# Patient Record
Sex: Male | Born: 1960 | Race: White | Hispanic: No | Marital: Married | Smoking: Current some day smoker
Health system: Southern US, Community
[De-identification: ages and names within clinical notes are randomized; demographics above are authoritative.]

## PROBLEM LIST (undated history)

## (undated) DIAGNOSIS — Z9889 Other specified postprocedural states: Secondary | ICD-10-CM

## (undated) DIAGNOSIS — M1990 Unspecified osteoarthritis, unspecified site: Secondary | ICD-10-CM

## (undated) DIAGNOSIS — C4491 Basal cell carcinoma of skin, unspecified: Secondary | ICD-10-CM

## (undated) DIAGNOSIS — I10 Essential (primary) hypertension: Secondary | ICD-10-CM

## (undated) DIAGNOSIS — I219 Acute myocardial infarction, unspecified: Secondary | ICD-10-CM

## (undated) DIAGNOSIS — T8859XA Other complications of anesthesia, initial encounter: Secondary | ICD-10-CM

## (undated) DIAGNOSIS — J129 Viral pneumonia, unspecified: Secondary | ICD-10-CM

## (undated) DIAGNOSIS — J449 Chronic obstructive pulmonary disease, unspecified: Secondary | ICD-10-CM

## (undated) DIAGNOSIS — I509 Heart failure, unspecified: Secondary | ICD-10-CM

## (undated) DIAGNOSIS — K219 Gastro-esophageal reflux disease without esophagitis: Secondary | ICD-10-CM

## (undated) DIAGNOSIS — I429 Cardiomyopathy, unspecified: Secondary | ICD-10-CM

## (undated) DIAGNOSIS — N400 Enlarged prostate without lower urinary tract symptoms: Secondary | ICD-10-CM

## (undated) DIAGNOSIS — I2510 Atherosclerotic heart disease of native coronary artery without angina pectoris: Secondary | ICD-10-CM

## (undated) DIAGNOSIS — T4145XA Adverse effect of unspecified anesthetic, initial encounter: Secondary | ICD-10-CM

## (undated) DIAGNOSIS — N289 Disorder of kidney and ureter, unspecified: Secondary | ICD-10-CM

## (undated) DIAGNOSIS — R112 Nausea with vomiting, unspecified: Secondary | ICD-10-CM

## (undated) DIAGNOSIS — Z87442 Personal history of urinary calculi: Secondary | ICD-10-CM

## (undated) DIAGNOSIS — F419 Anxiety disorder, unspecified: Secondary | ICD-10-CM

## (undated) DIAGNOSIS — E785 Hyperlipidemia, unspecified: Secondary | ICD-10-CM

## (undated) DIAGNOSIS — R768 Other specified abnormal immunological findings in serum: Secondary | ICD-10-CM

## (undated) DIAGNOSIS — E119 Type 2 diabetes mellitus without complications: Secondary | ICD-10-CM

## (undated) DIAGNOSIS — G4730 Sleep apnea, unspecified: Secondary | ICD-10-CM

## (undated) HISTORY — PX: FINGER SURGERY: SHX640

## (undated) HISTORY — DX: Cardiomyopathy, unspecified: I42.9

## (undated) HISTORY — PX: PORT-A-CATH REMOVAL: SHX5289

## (undated) HISTORY — DX: Viral pneumonia, unspecified: J12.9

## (undated) HISTORY — PX: APPENDECTOMY: SHX54

## (undated) HISTORY — PX: HAND SURGERY: SHX662

## (undated) HISTORY — DX: Essential (primary) hypertension: I10

## (undated) HISTORY — DX: Type 2 diabetes mellitus without complications: E11.9

## (undated) HISTORY — PX: VASECTOMY: SHX75

## (undated) HISTORY — DX: Basal cell carcinoma of skin, unspecified: C44.91

## (undated) HISTORY — DX: Atherosclerotic heart disease of native coronary artery without angina pectoris: I25.10

## (undated) HISTORY — PX: PORTACATH PLACEMENT: SHX2246

## (undated) HISTORY — DX: Anxiety disorder, unspecified: F41.9

## (undated) HISTORY — DX: Hyperlipidemia, unspecified: E78.5

## (undated) HISTORY — PX: KNEE ARTHROSCOPY: SHX127

## (undated) HISTORY — DX: Chronic obstructive pulmonary disease, unspecified: J44.9

## (undated) HISTORY — DX: Other specified abnormal immunological findings in serum: R76.8

## (undated) HISTORY — PX: CORONARY STENT PLACEMENT: SHX1402

---
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
## 2002-04-01 DIAGNOSIS — J129 Viral pneumonia, unspecified: Secondary | ICD-10-CM

## 2002-04-01 HISTORY — DX: Viral pneumonia, unspecified: J12.9

## 2004-04-01 DIAGNOSIS — R768 Other specified abnormal immunological findings in serum: Secondary | ICD-10-CM

## 2004-04-01 HISTORY — DX: Other specified abnormal immunological findings in serum: R76.8

## 2010-01-23 ENCOUNTER — Ambulatory Visit (HOSPITAL_COMMUNITY)
Admission: RE | Admit: 2010-01-23 | Discharge: 2010-01-24 | Payer: Self-pay | Source: Home / Self Care | Admitting: General Surgery

## 2010-01-23 ENCOUNTER — Encounter (INDEPENDENT_AMBULATORY_CARE_PROVIDER_SITE_OTHER): Payer: Self-pay | Admitting: General Surgery

## 2010-01-23 ENCOUNTER — Encounter: Payer: Self-pay | Admitting: Cardiology

## 2010-01-23 HISTORY — PX: SKIN GRAFT SPLIT THICKNESS TRUNK: SUR1304

## 2010-01-24 ENCOUNTER — Encounter: Payer: Self-pay | Admitting: Cardiology

## 2010-03-28 ENCOUNTER — Encounter: Payer: Self-pay | Admitting: Physician Assistant

## 2010-03-28 ENCOUNTER — Encounter: Payer: Self-pay | Admitting: Cardiology

## 2010-03-28 ENCOUNTER — Inpatient Hospital Stay (HOSPITAL_COMMUNITY)
Admission: AD | Admit: 2010-03-28 | Discharge: 2010-03-31 | Payer: Self-pay | Source: Home / Self Care | Attending: Internal Medicine | Admitting: Internal Medicine

## 2010-03-29 ENCOUNTER — Encounter: Payer: Self-pay | Admitting: Cardiology

## 2010-03-29 ENCOUNTER — Encounter: Payer: Self-pay | Admitting: Cardiovascular Disease

## 2010-03-30 ENCOUNTER — Encounter: Payer: Self-pay | Admitting: Cardiovascular Disease

## 2010-03-30 ENCOUNTER — Encounter: Payer: Self-pay | Admitting: Cardiology

## 2010-03-31 ENCOUNTER — Encounter: Payer: Self-pay | Admitting: Cardiology

## 2010-04-01 DIAGNOSIS — E119 Type 2 diabetes mellitus without complications: Secondary | ICD-10-CM

## 2010-04-01 HISTORY — DX: Type 2 diabetes mellitus without complications: E11.9

## 2010-04-18 ENCOUNTER — Telehealth (INDEPENDENT_AMBULATORY_CARE_PROVIDER_SITE_OTHER): Payer: Self-pay | Admitting: *Deleted

## 2010-04-19 DIAGNOSIS — R0989 Other specified symptoms and signs involving the circulatory and respiratory systems: Secondary | ICD-10-CM

## 2010-04-19 DIAGNOSIS — I10 Essential (primary) hypertension: Secondary | ICD-10-CM | POA: Insufficient documentation

## 2010-04-19 DIAGNOSIS — M79609 Pain in unspecified limb: Secondary | ICD-10-CM | POA: Insufficient documentation

## 2010-04-19 DIAGNOSIS — R079 Chest pain, unspecified: Secondary | ICD-10-CM | POA: Insufficient documentation

## 2010-04-19 DIAGNOSIS — M549 Dorsalgia, unspecified: Secondary | ICD-10-CM | POA: Insufficient documentation

## 2010-04-19 DIAGNOSIS — E785 Hyperlipidemia, unspecified: Secondary | ICD-10-CM | POA: Insufficient documentation

## 2010-04-19 DIAGNOSIS — Z85828 Personal history of other malignant neoplasm of skin: Secondary | ICD-10-CM | POA: Insufficient documentation

## 2010-04-19 DIAGNOSIS — R0609 Other forms of dyspnea: Secondary | ICD-10-CM | POA: Insufficient documentation

## 2010-04-19 DIAGNOSIS — F17200 Nicotine dependence, unspecified, uncomplicated: Secondary | ICD-10-CM | POA: Insufficient documentation

## 2010-04-19 DIAGNOSIS — I200 Unstable angina: Secondary | ICD-10-CM | POA: Insufficient documentation

## 2010-04-20 ENCOUNTER — Ambulatory Visit
Admission: RE | Admit: 2010-04-20 | Discharge: 2010-04-20 | Payer: Self-pay | Source: Home / Self Care | Attending: Physician Assistant | Admitting: Physician Assistant

## 2010-04-20 ENCOUNTER — Encounter: Payer: Self-pay | Admitting: Cardiology

## 2010-04-20 DIAGNOSIS — F411 Generalized anxiety disorder: Secondary | ICD-10-CM | POA: Insufficient documentation

## 2010-04-23 NOTE — Discharge Summary (Addendum)
NAMESHANNAN, GARFINKEL                ACCOUNT NO.:  [id number]  MEDICAL RECORD NO.:  [id number]          PATIENT TYPE:  INP  LOCATION:  6526                         FACILITY:  [facility]  PHYSICIAN:  Noralyn Pick. Eden Emms, MD, FACCDATE OF BIRTH:  1960-11-29  DATE OF ADMISSION:  03/28/2010 DATE OF DISCHARGE:  03/31/2010                              DISCHARGE SUMMARY   PRIMARY CARDIOLOGIST:  Learta Codding, MD, [facility]  Primary care is provided at [facility] Department.  DISCHARGE DIAGNOSIS:  Non-ST-segment elevation myocardial infarction.  SECONDARY DIAGNOSES: 1. Coronary disease, status post successful drug-eluting stent     placement within the left anterior descending artery this     admission. 2. Ischemic cardiomyopathy, ejection fraction 45-50%. 3. Newly diagnosed type 2 diabetes mellitus with a hemoglobin A1c of     9.0. 4. Hypertension. 5. Hyperlipidemia with an LDL of 162. 6. Ongoing tobacco abuse.  ALLERGIES:  TAGAMET.  PROCEDURES: 1. Left heart cardiac catheterization on March 29, 2010, revealing     a 99% stenosis in the mid LAD and otherwise nonobstructive disease. 2. Successful PCI stenting in the LAD with placement of a 3.5 x 24-mm     Promus Element Plus drug-eluting stent. 3. A 2-D echocardiogram on March 29, 2010, showing an EF of 50-55%     with probable hypokinesis of apical myocardium.  Grade 1 diastolic     dysfunction.  Could not exclude the apical thrombus. 4. Transesophageal echocardiogram on March 30, 2010, showing an EF     of 45-50% with apical akinesis.  There were prominent apical     trabeculations, but no LV thrombus noted.  There was a patent     foramen ovale by bubble study.  HISTORY OF PRESENT ILLNESS:  A 50 year old Caucasian male without prior cardiac history.  The patient presented to the [facility] on March 28, 2010, secondary to sudden onset of substernal chest discomfort that was somewhat worse with deep breathing.   EKG at [facility] showed dynamic T-changes in the anterior precordial leads and his first troponin was slightly elevated at 0.07.  The patient will be placed on aspirin, beta-blocker and heparin therapy and decision was made to transfer to [facility] for further evaluation.  HOSPITAL COURSE:  Following arrival to [facility], the patient continued to have intermittent chest discomfort, partially relieved with morphine and IV nitroglycerin.  He did eventually peaked his troponin of 0.50 with only a slightly elevated CK-MB of 4.7.  In the early morning hours of March 29, 2010, pain worsened and the patient was placed on Integrilin therapy.  Following this, the patient was pain free.  The patient underwent diagnostic catheterization on March 29, 2010 showing a 99% stenosis in the mid-LAD and otherwise nonobstructive disease.  The LAD was successfully stented with placement a 3.5 x 24-mm Promus Element drug-eluting stent.  Mr.  underwent 2-D echocardiogram this admission showing an EF of 50- 55%.  There was question of an apical thrombus and TEE was recommended. Followup TEE on March 30, 2010 showed no evidence of LV, LA or RV thrombus.  There was, however, a  patent foraminal ovale by bubble study.  Mr. Bollen was noted to elevated blood glucoses during this admission. Hemoglobin A1c was 9.0.  He does not have routine primary care in the outpatient setting and thus this is a new diagnosis.  We have provided him with prescription for metformin 500 mg daily to be started on April 01, 2009, along with a prescription for glucometer lancets and test strips.  He has been seen by diabetes education here and we have strongly recommend to him that he follow up with [facility] Department within the next week for further management of his diabetes as he has been seen there before.  Mr. Teixeira has been counseling on the importance of smoking cessation.  He says he is  committed to quitting.  He will be discharged home today in good condition.  DISCHARGE LABS:  Hemoglobin 15.4, hematocrit 44.3, WBC 11.7, platelets 219.  Sodium 137, potassium 4.7, chloride 106, CO2 26, BUN 8, creatinine 0.86, glucose 200, hemoglobin A1c 9.0.  CK 85, MB 4.7, troponin-I 0.47. Total cholesterol 243, triglycerides 253, HDL 30, LDL 162.  Hepatitis B surface antigen was negative.  DISPOSITION:  The patient will be discharged home today in good condition.  FOLLOWUP PLANS AND APPOINTMENTS:  The patient will follow up with Dr. Andee Lineman in approximately 2 weeks in our [place] office.  The patient will follow up with [facility] Department within the next week.  DISCHARGE MEDICATIONS: 1. Aspirin 35 mg daily. 2. Atorvastatin 80 mg nightly. 3. Coreg 3.125 mg b.i.d. 4. Metformin 500 mg daily, to be started on April 01, 2010. 5. Nitroglycerin 0.4 mg sublingual p.r.n. chest pain. 6. Prasugrel 10 mg daily. 7. Tylenol Extra Strength 500 mg two tablets daily p.r.n. headache.  OUTSTANDING LAB STUDIES:  Follow up lipids and LFTs in approximately 6-8 weeks, given new statin therapy.  DURATION OF DISCHARGE ENCOUNTER:  Forty five minutes including physician time.     Nicolasa Ducking, ANP   ______________________________ Noralyn Pick. Eden Emms, MD, [facility]    CB/MEDQ  D:  03/31/2010  T:  04/01/2010  Job:  [id number]  cc:   [facility]  Electronically Signed by Nicolasa Ducking ANP on 04/23/2010 03:39:55 PM Electronically Signed by [name] MD [facility] on 04/23/2010 05:26:12 PM

## 2010-05-03 NOTE — Medication Information (Signed)
Summary: [place] D/C MEDICATION SHEET  [place] D/C MEDICATION SHEET   Imported By: Zachary George 04/19/2010 12:15:43  _____________________________________________________________________  External Attachment:    Type:   Image     Comment:   External Document

## 2010-05-03 NOTE — Op Note (Signed)
Summary: Operative Report  Operative Report   Imported By: Dorise Hiss 04/19/2010 15:10:43  _____________________________________________________________________  External Attachment:    Type:   Image     Comment:   External Document

## 2010-05-03 NOTE — Letter (Signed)
Summary: Discharge Summary  Discharge Summary   Imported By: Dorise Hiss 04/19/2010 15:55:08  _____________________________________________________________________  External Attachment:    Type:   Image     Comment:   External Document

## 2010-05-03 NOTE — Consult Note (Signed)
Summary: [facility]ARDIOLOGY CONSULT[facility]  CARDIOLOGY CONSULT/[facility]   Imported By: Zachary George 04/19/2010 12:19:19  _____________________________________________________________________  External Attachment:    Type:   Image     Comment:   External Document

## 2010-05-03 NOTE — Letter (Signed)
Summary: Discharge Summary  Discharge Summary   Imported By: Dorise Hiss 04/19/2010 15:11:58  _____________________________________________________________________  External Attachment:    Type:   Image     Comment:   External Document

## 2010-05-03 NOTE — Progress Notes (Signed)
Summary: Chest Pain  Phone Note Call from Patient Call back at Home Phone [phone]   Summary of Call: Pt called stating after "morning stroll" today his heart felt funny. He states it doesn't feel like his MI did but feels like a sore muscle. He states pain is in his heart. He denies worsening pain with movement or deep breath. He also states he just hasn't felt good all day.   Discussed with Dr. Earnestine Leys who states he will see pt on Friday. Pt notified and verbalized understanding. He is aware to go to ER for worsening pain.  Initial call taken by: Cyril Loosen, RN, BSN,  April 18, 2010 1:38 PM

## 2010-05-03 NOTE — Cardiovascular Report (Signed)
Summary: Cardiac Catheterization  Cardiac Catheterization   Imported By: Dorise Hiss 04/19/2010 15:53:35  _____________________________________________________________________  External Attachment:    Type:   Image     Comment:   External Document

## 2010-05-03 NOTE — Assessment & Plan Note (Signed)
Summary: EPH- [facility] 03/31/10,   Visit Type:  Follow-up Primary Provider:  [facility]   History of Present Illness: the patient is a 50 year old male recently diagnosed with an non-ST elevation myocardial infarction status post stent placement to the mid LAD but a promise drug-eluting stent.the patient has been doing well. He has been placed on aspirin and prasugrel. He does report occasional episodes of chest pressure intermittently not always on exertion and very different from his prior presentation. He is under significant amount of stress and has significant anxiety. He has insomnia and uses trazodone as a hypnotic. He denies any palpitations, presyncope or syncope. His EKG today in the office is within normal limits. Is on high-dose statin drug therapy. He is concerned regarding the damage done to his heart but I reassured him that an echocardiogram done after the intervention showed an ejection fraction of 55% which is essentially within normal limits. The patient will need to stay at least a year and aspirin and prasugrel.  Preventive Screening-Counseling & Management  Alcohol-Tobacco     Smoking Status: quit     Year Started: 1979     Year Quit: 03/28/10     Pack years: 2 PPD  Current Medications (verified): 1)  Carvedilol 6.25 Mg Tabs (Carvedilol) .... Take 1 Tablet By Mouth Two Times A Day 2)  Lipitor 80 Mg Tabs (Atorvastatin Calcium) .... Take 1 Tablet By Mouth Once A Day 3)  Aspirin 325 Mg Tabs (Aspirin) .... Take 1 Tablet By Mouth Once A Day 4)  Effient 10 Mg Tabs (Prasugrel Hcl) .... Take 1 Tablet By Mouth Once A Day 5)  Tylenol Extra Strength 500 Mg Tabs (Acetaminophen) .... Take 2 Tablet By Mouth Once A Day 6)  Nitrostat 0.4 Mg Subl (Nitroglycerin) .... Use As Directed Chest Pain 7)  Metformin Hcl 500 Mg Tabs (Metformin Hcl) .... Take 1 Tablet By Mouth Twice A Day 8)  Trazodone Hcl 50 Mg Tabs (Trazodone Hcl) .... Take 1 Tablet By Mouth Once A Day 9)   Clonazepam 0.5 Mg Tabs (Clonazepam) .... Take 1 Tablet By Mouth Two Times A Day  Allergies (verified): 1)  ! Tagamet  Comments:  Nurse/Medical Assistant: The patient's medication bottles and allergies were reviewed with the patient and were updated in the Medication and Allergy Lists.  Past History:  Past Surgical History: Last updated: 04/19/2010 Cardiac Catheterization  Wide excision with split-thickness skin graft of right chest   wall.  finger surgery  Family History: Last updated: 04/19/2010 Negative for premature CAD  Social History: Last updated: 04/19/2010 Lives in [place] divorced and has two grown children Full time firefighter Smokes approx. a pack a day started age 80 Drinks occasional beer and wine  Risk Factors: Smoking Status: quit (04/20/2010)  Past Medical History: Hypertension   a. Diagnosed Approx. 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 Status post surgical resection in 12-2009 by Dr. Barbaraann Barthel @ [facility] History of positive hepatitis B surface antibody in 2006 Status post drug-eluting stent placement to the LAD December 2011 Promus drug-eluting stent Echocardiogram during this hospitalization with an ejection fraction of 55% Transesophageal echocardiogram during the same hospitalization without evidence of LV thrombus.  Social History: Smoking Status:  quit Pack years:  2 PPD  Vital Signs:  Patient profile:   50 year old male Height:      71 inches Weight:      183  pounds BMI:     25.62 Pulse rate:   79 / minute BP sitting:   147 / 93  (left arm) Cuff size:   regular  Vitals Entered By: Carlye Grippe (April 20, 2010 9:57 AM)  Nutrition Counseling: Patient's BMI is greater than 25 and therefore counseled on weight management options.  Physical Exam  Additional Exam:  General: Well-developed, well-nourished in no distress head: Normocephalic and  atraumatic eyes PERRLA/EOMI intact, conjunctiva and lids normal nose: No deformity or lesions mouth normal dentition, normal posterior pharynx neck: Supple, no JVD.  No masses, thyromegaly or abnormal cervical nodes lungs: Normal breath sounds bilaterally without wheezing.  Normal percussion heart: regular rate and rhythm with normal S1 and S2, no S3 or S4.  PMI is normal.  No pathological murmurs abdomen: Normal bowel sounds, abdomen is soft and nontender without masses, organomegaly or hernias noted.  No hepatosplenomegaly musculoskeletal: Back normal, normal gait muscle strength and tone normal pulsus: Pulse is normal in all 4 extremities Extremities: No peripheral pitting edema neurologic: Alert and oriented x 3 skin: Intact without lesions or rashes cervical nodes: No significant adenopathy psychologic: Normal affect    Impression & Recommendations:  Problem # 1:  INTERMEDIATE CORONARY SYNDROME (ICD-411.1)  the patient is status post drug-eluting stent placement to the LAD. He needs to stay on aspirin prasugrel for at least a year and elected surgical procedures should be deferred. I've instructed the patient to contact our office in the event elective surgery scheduled, dental procedures, GI procedures and  spinal procedures. he will need to contact our office and have his physician also ask for approval for discontinuation and management of antiplatelet therapy. His updated medication list for this problem includes:    Carvedilol 6.25 Mg Tabs (Carvedilol) .Marland Kitchen... Take 1 tablet by mouth two times a day    Aspirin 325 Mg Tabs (Aspirin) .Marland Kitchen... Take 1 tablet by mouth once a day    Effient 10 Mg Tabs (Prasugrel hcl) .Marland Kitchen... Take 1 tablet by mouth once a day    Nitrostat 0.4 Mg Subl (Nitroglycerin) ..... Use as directed chest pain  His updated medication list for this problem includes:    Carvedilol 6.25 Mg Tabs (Carvedilol) .Marland Kitchen... Take 1 tablet by mouth two times a day    Aspirin 325 Mg  Tabs (Aspirin) .Marland Kitchen... Take 1 tablet by mouth once a day    Effient 10 Mg Tabs (Prasugrel hcl) .Marland Kitchen... Take 1 tablet by mouth once a day    Nitrostat 0.4 Mg Subl (Nitroglycerin) ..... Use as directed chest pain  Problem # 2:  HYPERLIPIDEMIA (ICD-272.4)  the patient is on high-dose statin drug therapy and his lipid panel can be checked in the next couple of months. His updated medication list for this problem includes:    Lipitor 80 Mg Tabs (Atorvastatin calcium) .Marland Kitchen... Take 1 tablet by mouth once a day  His updated medication list for this problem includes:    Lipitor 80 Mg Tabs (Atorvastatin calcium) .Marland Kitchen... Take 1 tablet by mouth once a day  Problem # 3:  ANXIETY STATE, UNSPECIFIED (ICD-300.00) the patient has significant anxiety disorder. We discussed the possible use of SSRI or SNRI medications. I have explained the marginal efficacy and the potential problems with withdrawal symptoms. I told him that probably best is to use low-dose clonazepam, realizing that this could be indicative and that he should not escalate his dosing regimen. The patient has has taken Xanax and this was not associated with significant sedation. He  also can increase trazodone at night if he has difficulty sleeping.  Patient Instructions: 1)  Clonazepam 0.5mg  two times a day  2)  Increase Coreg to 6.25mg  two times a day  3)  Follow up in  6 months Prescriptions: CLONAZEPAM 0.5 MG TABS (CLONAZEPAM) Take 1 tablet by mouth two times a day  #60 x 1   Entered by:   Hoover Brunette, LPN   Authorized by:   Lewayne Bunting, MD, [facility]   Signed by:   Hoover Brunette, LPN on 16/12/9602   Method used:   Print then Give to Patient   RxID:   [id number] CARVEDILOL 6.25 MG TABS (CARVEDILOL) Take 1 tablet by mouth two times a day  #60 x 6   Entered by:   Hoover Brunette, LPN   Authorized by:   Lewayne Bunting, MD, [facility]   Signed by:   Hoover Brunette, LPN on 21/30/8657   Method used:   Electronically to        [facility] (retail)       [address]       [place], [place]  [address]       Ph: [phone]       Fax: [phone]   RxID:   [id number]

## 2010-05-07 ENCOUNTER — Telehealth (INDEPENDENT_AMBULATORY_CARE_PROVIDER_SITE_OTHER): Payer: Self-pay | Admitting: *Deleted

## 2010-05-08 ENCOUNTER — Encounter (INDEPENDENT_AMBULATORY_CARE_PROVIDER_SITE_OTHER): Payer: Self-pay | Admitting: *Deleted

## 2010-05-08 ENCOUNTER — Ambulatory Visit (INDEPENDENT_AMBULATORY_CARE_PROVIDER_SITE_OTHER): Payer: Self-pay | Admitting: Cardiology

## 2010-05-08 ENCOUNTER — Encounter: Payer: Self-pay | Admitting: Cardiology

## 2010-05-08 DIAGNOSIS — I259 Chronic ischemic heart disease, unspecified: Secondary | ICD-10-CM | POA: Insufficient documentation

## 2010-05-08 DIAGNOSIS — I2510 Atherosclerotic heart disease of native coronary artery without angina pectoris: Secondary | ICD-10-CM

## 2010-05-08 DIAGNOSIS — R072 Precordial pain: Secondary | ICD-10-CM

## 2010-05-08 DIAGNOSIS — R0602 Shortness of breath: Secondary | ICD-10-CM

## 2010-05-10 ENCOUNTER — Encounter: Payer: Self-pay | Admitting: Cardiology

## 2010-05-10 DIAGNOSIS — R0602 Shortness of breath: Secondary | ICD-10-CM

## 2010-05-11 ENCOUNTER — Encounter (INDEPENDENT_AMBULATORY_CARE_PROVIDER_SITE_OTHER): Payer: Self-pay | Admitting: *Deleted

## 2010-05-14 ENCOUNTER — Encounter (INDEPENDENT_AMBULATORY_CARE_PROVIDER_SITE_OTHER): Payer: Self-pay | Admitting: *Deleted

## 2010-05-14 ENCOUNTER — Encounter: Payer: Self-pay | Admitting: Cardiology

## 2010-05-14 DIAGNOSIS — R9439 Abnormal result of other cardiovascular function study: Secondary | ICD-10-CM | POA: Insufficient documentation

## 2010-05-15 ENCOUNTER — Encounter: Payer: Self-pay | Admitting: Cardiology

## 2010-05-15 ENCOUNTER — Inpatient Hospital Stay (HOSPITAL_BASED_OUTPATIENT_CLINIC_OR_DEPARTMENT_OTHER)
Admission: RE | Admit: 2010-05-15 | Discharge: 2010-05-15 | Disposition: A | Discharge status: Home / Self Care | Payer: Self-pay | Source: Ambulatory Visit | Attending: Cardiovascular Disease | Admitting: Cardiovascular Disease

## 2010-05-15 DIAGNOSIS — Z9861 Coronary angioplasty status: Secondary | ICD-10-CM | POA: Insufficient documentation

## 2010-05-15 DIAGNOSIS — E119 Type 2 diabetes mellitus without complications: Secondary | ICD-10-CM | POA: Insufficient documentation

## 2010-05-15 DIAGNOSIS — I2510 Atherosclerotic heart disease of native coronary artery without angina pectoris: Secondary | ICD-10-CM | POA: Insufficient documentation

## 2010-05-15 DIAGNOSIS — I252 Old myocardial infarction: Secondary | ICD-10-CM | POA: Insufficient documentation

## 2010-05-15 DIAGNOSIS — F17200 Nicotine dependence, unspecified, uncomplicated: Secondary | ICD-10-CM | POA: Insufficient documentation

## 2010-05-15 DIAGNOSIS — E785 Hyperlipidemia, unspecified: Secondary | ICD-10-CM | POA: Insufficient documentation

## 2010-05-15 DIAGNOSIS — R0602 Shortness of breath: Secondary | ICD-10-CM | POA: Insufficient documentation

## 2010-05-15 DIAGNOSIS — R42 Dizziness and giddiness: Secondary | ICD-10-CM | POA: Insufficient documentation

## 2010-05-15 LAB — POCT I-STAT GLUCOSE
Glucose, Bld: 233 mg/dL — ABNORMAL HIGH (ref 70–99)
Operator id: 221371

## 2010-05-17 NOTE — Cardiovascular Report (Signed)
Summary: Cardiac Cath   Cardiac Cath   Imported By: Earl Many 05/04/2010 17:17:34  _____________________________________________________________________  External Attachment:    Type:   Image     Comment:   External Document

## 2010-05-17 NOTE — Progress Notes (Signed)
Summary: SOB; LIGHT HEADECD AND DIZZY  Phone Note Call from Patient Call back at Home Phone [phone]   Caller: Patient Reason for Call: Talk to Nurse Summary of Call: Patient wanting to be worked in today or tomorrow.  Has been SOB past couple of days hasnt felt good along with dizziness & light headed. He is also in your voice mail.  [phone] Initial call taken by: Claudette Laws,  May 07, 2010 8:17 AM  Follow-up for Phone Call        Pt also called the office stating he needs a note for work stating his medications won't affect his job performance. He states some at work have noticed he seems droopy eyed and not himself since starting meds. 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 Follow-up by: Cyril Loosen, RN, BSN,  May 07, 2010 9:27 AM  Additional Follow-up for Phone Call Additional follow up Details #1::        Patient called wanting to know if MD had chance to reply to message yet.   Hoover Brunette, LPN  May 07, 2010 4:15 PM     Additional Follow-up for Phone Call Additional follow up Details #2::    Patient can be scheduled today (05/03/10) in slot of Mr. Jack Quarto who is in hospital. Please notify patient Follow-up by: Lewayne Bunting, MD, [facility],  May 08, 2010 5:29 AM  Additional Follow-up for Phone Call Additional follow up Details #3:: Details for Additional Follow-up Action Taken: Spoke with Casimiro Needle this morning and confirmed that he can come in this afternoon to see Dr. Andee Lineman at 2:15. Told him to bring insurance cards and Medicine bottles. Additional Follow-up by: Claudette Laws,  May 08, 2010 8:13 AM

## 2010-05-17 NOTE — Cardiovascular Report (Signed)
Summary: [facility] Cardiac Cath  [facility] Cardiac Cath   Imported By: Earl Many 05/10/2010 15:34:45  _____________________________________________________________________  External Attachment:    Type:   Image     Comment:   External Document

## 2010-05-17 NOTE — Letter (Signed)
Summary: Lexiscan or Dobutamine Pharmacist, community at [facility]  [address]   [place], [place] [address]   Phone: [phone]  Fax: [phone]      [facility] Cardi[facility]  Lexiscan or Dobutamine Cardiolite Strss Test    Laury Axon  Appointment Date:_  Appointment Time:_  Your doctor has ordered a CARDIOLITE STRESS TEST using a medication to stimulate exercise so that you will not have to walk on the treadmill to determine the condition of your heart during stress. If you take blood pressure medication, ask your doctor if you should take it the day of your test. You should not have anything to eat or drink at least 4 hours before your test is scheduled, and no caffeine, including decaffeinated tea and coffee, chocolate, and soft drinks for 24 hours before your test.  You will need to register at the Outpatient/Main Entrance at the hospital 15 minutes before your appointment time. It is a good idea to bring a copy of your order with you. They will direct you to the Diagnostic Imaging (Radiology) Department.  You will be asked to undress from the waist up and given a hospital gown to wear, so dress comfortably from the waist down for example: Sweat pants, shorts, or skirt Rubber soled lace up shoes (tennis shoes)  Plan on about three hours from registration to release from the hospital

## 2010-05-17 NOTE — Letter (Signed)
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 [address]   [place], [place] [address]   Phone: [phone]  Fax: [phone]    05/08/2010  TO: Leodis Sias IT MAY CONCERN   RE: Derek Chen [address] [id number]   The above named individual is under my medical care and may return to work on:  Thursday, Febreuary 9.  If you have any further questions or need additional information, please call.     Sincerely,    Hoover Brunette, LPN

## 2010-05-17 NOTE — Letter (Signed)
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 [address]   [place], [place] [address]   Phone: [phone]  Fax: [phone]    05/08/2010  TO: Leodis Sias IT MAY CONCERN   RE: Derek Chen [address] [id number]   The above named individual is under my medical care and may return to work on:  Sunday, February 12.  He is scheduled for Lexiscan Cardiolite Stress test on Thursday, February 9.  He will also have follow up appointment in our office again on March 15.    If stress test results are abnormal, he could possibly have follow up sooner than above.    If you have any further questions or need additional information, please call.     Sincerely,    Hoover Brunette, LPN

## 2010-05-17 NOTE — Miscellaneous (Signed)
Summary: report & message sent to [facility] e-mail by GD   Clinical Lists Changes     Will need JV cath next week    [facility]   [address] [place], [place] [address]   [phone] Ext: [phone]   [phone]     Patient:  Derek Chen, Derek Chen DOB: 10/23/1960 Referring:  Lewayne Bunting Med Rec#: [id number] Age: 50 Referring:  Learta Codding, F.A.C Study Date: 05/10/2010 Sex: M Reading:  Lewayne Bunting Account#: [id number]  Nurse:  Joylene Draft, R.N. Accession#: [id number]  Technologist:  Delma Post Room#:        ICD Codes:   786.05 Shortness of Breath Procedure(s) Performed:   ETT with Nuclear -- Interp., Report and Tracing ([id number] + [id number])   Stress Nuclear Report      Current Clinical Presentation :     The patient had no chest pain on presentation.    Baseline ECG :     Normal sinus rhythm. Normal repolarization.    Pharmacologic Protocol :     Lexiscan.     Stress ECG :     Normal sinus rhythm. No ST-segment depression under pharmacologic stress.    Recovery ECG :     5 minutes into recovery.    Stress Test Conclusion : Normal pharmacologic .     Normal pharmacologic stress test, with no ECG changes consistent with ischemia. There was a normal heart rate response, with a blunted blood pressure response to stress.. The patient experienced no chest pain. The test was terminated due to completion of vasodilator infusion. An adequate level of stress was achieved.    Hemodynamics       REST STRESS RECOVERY        SBP 137 mmHg 137 mmHg 121 mmHg        DBP 85 mmHg 85 mmHg 80 mmHg        HR 66 bpm 97 bpm 86 bpm        %MPHR  56%            Imaging Protocol :   This was a gated SPECT myocardial perfusion imaging study. A one day rest-stress imaging protocol was followed using Tc-34m sestamibi (Cardiolite) injected  intravenously. For the rest portion of the study, 14.2 mCi of the radiopharmaceutical was administered at 05/10/2010 10:00. Rest imaging was performed at 1100. For the stress portion of the study, 40 mCi was administered at 05/10/2010 12:26. Stress imaging was performed at 1325.  Perfusion Interpretation :   There was a small, fixed defect in the apical anterior segment(s). The degree of photon reduction was severe. There was a medium, partially reversible defect in the basal inferolateral, basal anterolateral, mid inferolateral, mid anterolateral, apical lateral segment(s). The degree of photon reduction was mild to severe. There was a small, reversible defect in the basal inferoseptal, basal inferior segment(s). The degree of photon reduction was moderate.TID Ratio 0.98. Lung Heart Ratio 0.73.  Wall Motion Interpretation :   Gated SPECT imaging under post-stress conditions demonstrated normal wall motion.The patient's calculated post stress LVEF was 52%. The patient's end diastolic volume was . The patient's end systolic volume was 65ml.  Study Limitations :   The overall quality of the study was good. Limitations and artifact were due to diaphragm or other soft tissue and subdiaphragm activity.  Nuclear Cardiology Conclusion : Abnormal LV perfusion.   Abnormal with Tc-76m sestamibi imaging. Stress testing induced no chest pain symptoms and no ECG changes consistent  with ischemia. Global left ventricular systolic function was mildly reduced, with an EF of 52%. In addition, mild global hypokinesis was present.There was a medium, partially reversible, apical to basal- inferior defect. This defect was consistent with ischemia in addition to prior myocardial infarction.There was also a second medium, completely reversible, basal- inferoseptal defect. This defect was consistent with ischemia.In addition, there was a third small, fixed, apical to mid-anterior defect. This defect was consistent  with prior myocardial infarction.  -- Alvin Critchley Gent,MD,FACCBoard Certified in Internal Medicine, Adult Cardiovascular Medicine and Critical Care MedicineThe information in this electronic mail is sensitive, protected information intended only for the addressee(s). Any other person, including anyone who believes he/she might have received it due to an addressing error, is requested to notify the sender immediately and delete it without further reading or retention. The information is not to be forwarded or shared unless in compliance with [employer] on confidentiality and/or with the approval of the sender.

## 2010-05-18 ENCOUNTER — Telehealth: Payer: Self-pay | Admitting: Cardiology

## 2010-05-23 NOTE — Letter (Signed)
Summary: Cardiac Cath Instructions - JV Lab  [facility] [address]. [address]   [place], [place] [address]   Phone: [phone]  Fax: [phone]     05/14/2010 MRN: [id number]  Derek Chen [address] [place], [place]  [address]  [place]  Dear Derek Chen,   You are scheduled for a Cardiac Catheterization on Tuesday, February 14 at 12:00 with Dr. Sanjuana Kava.  Please arrive to the 1st floor of the Heart and Vascular Center at [facility] at 11:00 am on the day of your procedure. Please do not arrive before 6:30 a.m. Call the Heart and Vascular Center at [phone] if you are unable to make your appointmnet. The Code to get into the parking garage under the building is 0300. Take the elevators to the 1st floor. You must have someone to drive you home. Someone must be with you for the first 24 hours after you arrive home. Please wear clothes that are easy to get on and off and wear slip-on shoes. Do not eat or drink after midnight except water with your medications that morning. Bring all your medications and current insurance cards with you.  __X_ DO NOT take these medications before your procedure:  Metformin - hold 24 hours prior to & 48 hours after cath  __X_ Make sure you take your aspirin.  ___ You may take ALL of your medications with water that morning.  ___ DO NOT take ANY medications before your procedure.  ___ Pre-med instructions:  ________________________________________________________________________  The usual length of stay after your procedure is 2 to 3 hours. This can vary.  If you have any questions, please call the office at the number listed above.  Hoover Brunette, LPN                  Directions to the JV Lab [facility]  Please Note : Park in [place] under the building not the parking deck.  From [employer]: Turn onto [address] Left onto [address] (1st stoplight) Right at the brick  entrance to the hospital (Main circle [address]) Bear to the right and you will see a blue sign "Heart and Vascular Center" Parking garage is a sharp right'to get through the gate out in the code _______. Once you park, take the elevator to the first floor. Please do not arrive before 0630am. The building will be dark before that time.   From [address] Turn onto [address] Turn left into the brick entrance to the hospital (Main circle drive) Bear to the right and you will see a blue sign "Heart and Vascular Center" Parking garage is a sharp right, to get thru the gate put in the code ____. Once you park, take the elevator to the first floor. Please do not arrive before 0630am. The building will be dark before that time

## 2010-05-23 NOTE — Assessment & Plan Note (Signed)
Summary: ROV-PER MESSAGE FROM Lizett Chowning ADD TODAY FOR SOB & DIZZINESS-SRS   Visit Type:  Follow-up Primary Provider:  [facility]   History of Present Illness: the patient is a 50 year old male recently diagnosed with an non-ST elevation myocardial infarction status post stent placement to the mid LAD but a promise drug-eluting stent.the patient has been doing well. He has been placed on aspirin and prasugrel. He does report occasional episodes of chest pressure intermittently not always on exertion and very different from his prior presentation. He is under significant amount of stress and has significant anxiety. He has insomnia and uses trazodone as a hypnotic. He denies any palpitations, presyncope or syncope. His EKG today in the office is within normal limits. Is on high-dose statin drug therapy. He is concerned regarding the damage done to his heart but I reassured him that an echocardiogram done after the intervention showed an ejection fraction of 55% which is essentially within normal limits. The patient will need to stay at least a year and aspirin and prasugrel. The patient stated he has developed increasingly short of breath Yells at to use nitroglycerin several occasions. As a matter fact that the nitroglycerin yesterday There also reports occasional palpitations periods Couple of nights ago he developed shortness of breath for 10 to 15 minutes and this was associated with clammy feeling in chest tightness. The patient's exercise tolerance since his catheterization has markedly decreased The EKG was reviewed in the office today and was essentially within normal limits.   Preventive Screening-Counseling & Management  Alcohol-Tobacco     Smoking Status: quit     Year Quit: 03/28/10  Current Medications (verified): 1)  Carvedilol 6.25 Mg Tabs (Carvedilol) .... Take 1 Tablet By Mouth Two Times A Day 2)  Lipitor 80 Mg Tabs (Atorvastatin Calcium) .... Take 1 Tablet By Mouth Once A  Day 3)  Aspirin 325 Mg Tabs (Aspirin) .... Take 1 Tablet By Mouth Once A Day 4)  Effient 10 Mg Tabs (Prasugrel Hcl) .... Take 1 Tablet By Mouth Once A Day 5)  Tylenol Extra Strength 500 Mg Tabs (Acetaminophen) .... Take 2 Tablet By Mouth Once A Day 6)  Nitrostat 0.4 Mg Subl (Nitroglycerin) .... Use As Directed Chest Pain 7)  Metformin Hcl 500 Mg Tabs (Metformin Hcl) .... Take 1 Tablet By Mouth Twice A Day 8)  Trazodone Hcl 50 Mg Tabs (Trazodone Hcl) .... Take 1 Tablet By Mouth Once A Day 9)  Clonazepam 0.5 Mg Tabs (Clonazepam) .... Take 1 Tablet By Mouth Two Times A Day 10)  Imdur 30 Mg Xr24h-Tab (Isosorbide Mononitrate) .... Take 1 Tablet By Mouth Once A Day  Allergies (verified): 1)  ! Tagamet  Comments:  Nurse/Medical Assistant: The patient's medication bottles and allergies were reviewed with the patient and were updated in the Medication and Allergy Lists.  Past History:  Past Medical History: Last updated: 04/20/2010 Hypertension   a. Diagnosed Approx. 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 Status post surgical resection in 12-2009 by Dr. Barbaraann Barthel @ [facility] History of positive hepatitis B surface antibody in 2006 Status post drug-eluting stent placement to the LAD December 2011 Promus drug-eluting stent Echocardiogram during this hospitalization with an ejection fraction of 55% Transesophageal echocardiogram during the same hospitalization without evidence of LV thrombus.  Past Surgical History: Last updated: 04/19/2010 Cardiac Catheterization  Wide excision with split-thickness skin graft of right chest   wall.  finger surgery  Family History: Last updated: 04/19/2010 Negative for premature CAD  Social History: Last updated: 04/19/2010 Lives in [place] divorced and has two grown children Full time firefighter Smokes approx. a pack a day started age 38 Drinks occasional beer and  wine  Risk Factors: Smoking Status: quit (05/08/2010)  Review of Systems       The patient complains of fatigue, chest pain, palpitations, and shortness of breath.  The patient denies malaise, fever, weight gain/loss, vision loss, decreased hearing, hoarseness, prolonged cough, wheezing, sleep apnea, coughing up blood, abdominal pain, blood in stool, nausea, vomiting, diarrhea, heartburn, incontinence, blood in urine, muscle weakness, joint pain, leg swelling, rash, skin lesions, headache, fainting, dizziness, depression, anxiety, enlarged lymph nodes, easy bruising or bleeding, and environmental allergies.    Vital Signs:  Patient profile:   50 year old male Height:      71 inches Weight:      185 pounds O2 Sat:      95 % on Room air Pulse (ortho):   104 / minute BP standing:   144 / 99  Vitals Entered By: Carlye Grippe (May 08, 2010 2:50 PM)  O2 Flow:  Room air  Serial Vital Signs/Assessments:  Time      Position  BP       Pulse  Resp  Temp     By 2:59 PM   Lying LA  150/94   99                    Lydia Anderson           Sitting   137/95   105                   [address]           Standing  144/99   104                   Carlye Grippe 3:05 PM   Standing  156/101  112                   Lydia Anderson 3:10 PM   Standing  146/100  122                   Carlye Grippe   Physical Exam  Additional Exam:  General: Well-developed, well-nourished in no distress head: Normocephalic and atraumatic eyes PERRLA/EOMI intact, conjunctiva and lids normal nose: No deformity or lesions mouth normal dentition, normal posterior pharynx neck: Supple, no JVD.  No masses, thyromegaly or abnormal cervical nodes lungs: Normal breath sounds bilaterally without wheezing.  Normal percussion heart: regular rate and rhythm with normal S1 and S2, no S3 or S4.  PMI is normal.  No pathological murmurs abdomen: Normal bowel sounds, abdomen is soft and nontender without masses, organomegaly or  hernias noted.  No hepatosplenomegaly musculoskeletal: Back normal, normal gait muscle strength and tone normal pulsus: Pulse is normal in all 4 extremities Extremities: No peripheral pitting edema neurologic: Alert and oriented x 3 skin: Intact without lesions or rashes cervical nodes: No significant adenopathy psychologic: Normal affect    EKG  Procedure date:  05/08/2010  Findings:      Sinus tachycardia, left axis deviation no acute ischemic changes. Heart rate 102 beats per minute   Impression & Recommendations:  Problem # 1:  ANXIETY STATE, UNSPECIFIED (ICD-300.00) The patient has a significant component of anxiety. However He also subjective symptoms suggesting  angina.   Problem # 2:  CORONARY ARTERY DISEASE, S/P PTCA (ICD-414.9) The patient's symptoms are concerning for angina and we will precede with a Lexiscan. If positive the patient will likely need to repeat cardiac catheterization. The patient has been compliant with duel antiplatelet therapy.  His updated medication list for this problem includes:    Carvedilol 6.25 Mg Tabs (Carvedilol) .Marland Kitchen... Take 1 tablet by mouth two times a day    Aspirin 325 Mg Tabs (Aspirin) .Marland Kitchen... Take 1 tablet by mouth once a day    Effient 10 Mg Tabs (Prasugrel hcl) .Marland Kitchen... Take 1 tablet by mouth once a day    Nitrostat 0.4 Mg Subl (Nitroglycerin) ..... Use as directed chest pain    Imdur 30 Mg Xr24h-tab (Isosorbide mononitrate) .Marland Kitchen... Take 1 tablet by mouth once a day  Problem # 3:  HYPERLIPIDEMIA (ICD-272.4) The patient is on high dose statin drug therapy.  His updated medication list for this problem includes:    Lipitor 80 Mg Tabs (Atorvastatin calcium) .Marland Kitchen... 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   5)  Follow up in   1 month Prescriptions: CARVEDILOL 6.25 MG TABS (CARVEDILOL) Take 1 tablet by mouth two times a day  #60 x 6   Entered by:   Hoover Brunette, LPN   Authorized by:   Lewayne Bunting, MD, [facility]   Signed by:   Hoover Brunette, LPN on 16/12/9602   Method used:   Electronically to        [facility] [address] (retail)       [address]       [place], [place]  [address]       Ph: [phone] or [phone]       Fax: [phone]   RxID:   [id number] IMDUR 30 MG XR24H-TAB (ISOSORBIDE MONONITRATE) Take 1 tablet by mouth once a day  #30 x 6   Entered by:   Hoover Brunette, LPN   Authorized by:   Lewayne Bunting, MD, [facility]   Signed by:   Hoover Brunette, LPN on 40/34/7425   Method used:   Electronically to        [facility] [address] (retail)       [address]       [place], [place]  [address]       Ph: [phone] or [phone]       Fax: [phone]   RxID:   [id number]   Handout requested.

## 2010-05-23 NOTE — Miscellaneous (Signed)
Summary: Orders Update - cath  Clinical Lists Changes  Problems: Added new problem of OTH NONSPECIFIC ABNORM CV SYSTEM FUNCTION STUDY (ICD-794.39) Orders: Added new Referral order of Cardiac Catheterization (Cardiac Cath) - Signed

## 2010-05-23 NOTE — Miscellaneous (Signed)
Summary: Orders Update  Clinical Lists Changes  Orders: Added new Test order of T-Basic Metabolic Panel [id number]) - Signed Added new Test order of T-Protime, Auto [phone]) - Signed Added new Test order of T-PTT ([id number]) - Signed Added new Test order of T-CBC No Diff ([id number]) - Signed

## 2010-05-29 NOTE — Letter (Signed)
Summary: Internal Correspondence/ FAXED PRE-CATH ORDER  Internal Correspondence/ FAXED PRE-CATH ORDER   Imported By: Dorise Hiss 05/22/2010 09:55:48  _____________________________________________________________________  External Attachment:    Type:   Image     Comment:   External Document

## 2010-05-31 NOTE — Procedures (Signed)
NAMERAMSES, KLECKA                ACCOUNT NO.:  [id number]  MEDICAL RECORD NO.:  [id number]          PATIENT TYPE:  INP  LOCATION:  6526                         FACILITY:  [facility]  PHYSICIAN:  [name], M.D. DATE OF BIRTH:  03/18/1961  DATE OF PROCEDURE:  05/15/2010 DATE OF DISCHARGE:  03/31/2010                           CARDIAC CATHETERIZATION   INDICATIONS:  Derek Chen is a 50 year old gentleman with a history of diabetes mellitus, cigarette smoking, and coronary artery disease.  He is admitted for further evaluation of persistent angina-like chest pain.  The patient has a history of non-ST-segment elevation myocardial infarction back in January.  He had a proximal LAD stent placed.  The patient had done fairly well but has continued to have episodes of exertional chest pain and severe shortness breath especially with exertion.  He also notes that his blood pressures have been fairly difficult to control.  He is referred for heart catheterization for further evaluation.  PROCEDURE:  Left heart catheterization with coronary angiography.  The right femoral artery was easily cannulated using the modified Seldinger technique.  HEMODYNAMICS:  LV pressure was 155/16.  The aortic pressure was 150/87.  ANGIOGRAPHY:  Left main:  The left main has mild luminal irregularities. It is moderately calcified.  The left anterior descending artery has a stent in the proximal segment. There are several ulcerated plaques located within the stented segment. These are areas that potentially have stent malapposition.  There is no evidence of thrombus.  The distal LAD has diffuse irregularities.  There is one particular focal spot that is between 50-75% stenosed.  It is a relatively small vessel approximately 1.5-1.75 mm in diameter.  It is too small to be stented.  There are diffuse irregularities.  The first diagonal artery is a small vessel with 30-40% stenosis.  The second diagonal  artery is a small to moderate-sized vessel with a mild degree of stenosis.  The left circumflex artery is a moderate-sized vessel. There is moderate diffuse disease throughout the circumflex artery and the obtuse marginal artery.  The right coronary artery has moderate diffuse disease.  The posterior descending artery has mild irregularities.  The left ventriculogram was performed in a 30 RAO position.  It reveals a very focal area of distal anterior wall dyskinesis.  The overall ejection fraction is mildly to moderately reduced with an EF of 40%. The remaining walls of the LV seem to contract fairly well.  COMPLICATIONS:  None.  CONCLUSION:  Good short-term patency of his left anterior descending stent.  At this point, there are no other lesions that need to be angioplastied.  His distal left anterior descending may be causing some ischemia, although it is too small to place a stent.  I do not think that it will stay open with balloon angioplasty.  We will try increasing his isosorbide.  We can also add Ranexa.  He will need aggressive control of his blood pressure.     [name], M.D.     PJN/MEDQ  D:  05/15/2010  T:  05/16/2010  Job:  [id number]  cc:   Learta Codding, MD,FACC  Verne Carrow, MD  Electronically Signed by Kristeen Miss M.D. on 05/31/2010 06:14:54 PM

## 2010-06-11 LAB — BASIC METABOLIC PANEL
Calcium: 8.9 mg/dL (ref 8.4–10.5)
Creatinine, Ser: 0.86 mg/dL (ref 0.4–1.5)
GFR calc Af Amer: >60 mL/min (ref >60)
GFR calc non Af Amer: >60 mL/min (ref >60)
Sodium: 137 mEq/L (ref 135–145)

## 2010-06-11 LAB — CBC
HCT: 43.4 % (ref 39.0–52.0)
Hemoglobin: 15.4 g/dL (ref 13.0–17.0)
MCH: 31.1 pg (ref 26.0–34.0)
MCV: 89.3 fL (ref 78.0–100.0)
Platelets: 219 10*3/uL (ref 150–400)
Platelets: 255 10*3/uL (ref 150–400)
RBC: 4.86 MIL/uL (ref 4.22–5.81)
RBC: 4.99 MIL/uL (ref 4.22–5.81)
RDW: 13.1 % (ref 11.5–15.5)
WBC: 11.7 10*3/uL — ABNORMAL HIGH (ref 4.0–10.5)
WBC: 15.1 10*3/uL — ABNORMAL HIGH (ref 4.0–10.5)

## 2010-06-11 LAB — HEPARIN LEVEL (UNFRACTIONATED)
Heparin Unfractionated: 0.27 IU/mL — ABNORMAL LOW (ref 0.30–0.70)
Heparin Unfractionated: <0.1 IU/mL — ABNORMAL LOW (ref 0.30–0.70)

## 2010-06-11 LAB — LIPID PANEL: Cholesterol: 243 mg/dL — ABNORMAL HIGH (ref 0–200)

## 2010-06-11 LAB — CARDIAC PANEL(CRET KIN+CKTOT+MB+TROPI)
CK, MB: 4.7 ng/mL — ABNORMAL HIGH (ref 0.3–4.0)
Relative Index: INVALID (ref 0.0–2.5)
Total CK: 85 U/L (ref 7–232)
Troponin I: 0.47 ng/mL — ABNORMAL HIGH (ref 0.00–0.06)
Troponin I: 0.5 ng/mL (ref 0.00–0.06)

## 2010-06-11 LAB — GLUCOSE, CAPILLARY
Glucose-Capillary: 134 mg/dL — ABNORMAL HIGH (ref 70–99)
Glucose-Capillary: 165 mg/dL — ABNORMAL HIGH (ref 70–99)
Glucose-Capillary: 223 mg/dL — ABNORMAL HIGH (ref 70–99)
Glucose-Capillary: 262 mg/dL — ABNORMAL HIGH (ref 70–99)

## 2010-06-11 LAB — HEMOGLOBIN A1C: Mean Plasma Glucose: 212 mg/dL — ABNORMAL HIGH (ref <117)

## 2010-06-13 LAB — BASIC METABOLIC PANEL
BUN: 9 mg/dL (ref 6–23)
CO2: 25 mEq/L (ref 19–32)
Calcium: 9.3 mg/dL (ref 8.4–10.5)
Creatinine, Ser: 0.91 mg/dL (ref 0.4–1.5)
Glucose, Bld: 156 mg/dL — ABNORMAL HIGH (ref 70–99)
Sodium: 135 mEq/L (ref 135–145)

## 2010-06-13 LAB — CBC
HCT: 47.3 % (ref 39.0–52.0)
MCH: 31.1 pg (ref 26.0–34.0)
MCV: 92 fL (ref 78.0–100.0)
Platelets: 234 10*3/uL (ref 150–400)
RBC: 5.14 MIL/uL (ref 4.22–5.81)

## 2010-06-14 ENCOUNTER — Ambulatory Visit: Payer: Self-pay | Admitting: Cardiology

## 2010-06-14 ENCOUNTER — Telehealth: Payer: Self-pay | Admitting: *Deleted

## 2010-06-19 NOTE — Progress Notes (Signed)
Summary: rescheduled OV   Phone Note Call from Patient   Summary of Call: Message left on voicemail stating that he was unable to come to appointment today because he had to work.  Will reschedule.   Initial call taken by: Hoover Brunette, LPN,  June 14, 2010 11:48 AM

## 2010-06-28 NOTE — Progress Notes (Signed)
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   Advised pt. will send message to MD and call him back as soon as he responds.   Patient verbalized understanding.  Initial call taken by: Hoover Brunette, LPN,  May 18, 2010 4:55 PM  Follow-up for Phone Call        I would probably refer to patient for this question to his primary care physician. Follow-up by: Lewayne Bunting, MD, [facility],  May 22, 2010 3:29 PM  Additional Follow-up for Phone Call Additional follow up Details #1::        Advised MD that he does need letter regarding recent meds you prescribe to him.   Hoover Brunette, LPN  May 31, 1608 11:35 AM     Additional Follow-up for Phone Call Additional follow up Details #2::    Mr. Cansler called today requesting information on the phone message that he left on 05-18-2010. please call [phone] Follow-up by: Zachary George,  May 24, 2010 10:51 AM  Additional Follow-up for Phone Call Additional follow up Details #3:: Details for Additional Follow-up Action Taken: I will call him but I deferred to his primary care physician.  Let me know if any to personally call him.   patient needs to let me know exact job description. 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  Does work 24 hour shifts twice a week and 3 days out of week has 9 hour days.  Mostly stays on the outside of the fires.    OK will write letter today.  Lewayne Bunting, MD, [facility]  June 14, 2010 7  Appended Document: needs letter   Further follow up done in EPIC.

## 2010-07-31 ENCOUNTER — Encounter: Payer: Self-pay | Admitting: Cardiology

## 2010-08-01 ENCOUNTER — Ambulatory Visit: Payer: Self-pay | Admitting: Cardiology

## 2010-08-14 ENCOUNTER — Ambulatory Visit: Payer: Self-pay | Admitting: Cardiology

## 2010-11-22 ENCOUNTER — Encounter: Payer: Self-pay | Admitting: Cardiology

## 2010-11-23 ENCOUNTER — Other Ambulatory Visit: Payer: Self-pay | Admitting: *Deleted

## 2010-12-12 MED ORDER — CARVEDILOL 6.25 MG PO TABS: 6.2500 mg | ORAL_TABLET | Freq: Two times a day (BID) | ORAL | Status: DC

## 2010-12-12 MED ORDER — PRASUGREL HCL 10 MG PO TABS: 10.0000 mg | ORAL_TABLET | Freq: Every day | ORAL | Status: DC

## 2010-12-12 NOTE — Telephone Encounter (Signed)
Patient calling checking on status of clonazepam refill and also need carvedilol and effient refills sent to [facility]. Nurse informed patient that he needed PCP to manage anxiety and gave names of 2 new providers in area on his voicemail.

## 2010-12-14 ENCOUNTER — Telehealth: Payer: Self-pay | Admitting: *Deleted

## 2010-12-14 NOTE — Telephone Encounter (Signed)
Patient states he can not afford the Effient.  Was initially getting from [facility] in [place], but they are no longer able to supply for him.  Advised him to at least buy a 2 week supply if possible so that he will not go without coverage.  From GD dictation, looks like he should be on med till at least December (1 full year).  Not sure if Plavix is an option for him.  Will be getting married in November & will have insurance after this.  Advised patient that GD will not be back in office till next week & will get back with him after we get reply from MD.  Patient verbalized understanding.

## 2010-12-19 NOTE — Telephone Encounter (Signed)
Patient can be switched to Plavix 75 mg by mouth daily. Continue aspirin 81 minutes by mouth daily. Patient will need to continue both medications at least until December possibly longer.

## 2010-12-20 MED ORDER — ASPIRIN EC 81 MG PO TBEC: 81.0000 mg | DELAYED_RELEASE_TABLET | Freq: Every day | ORAL | Status: AC

## 2010-12-20 MED ORDER — CLOPIDOGREL BISULFATE 75 MG PO TABS: 75.0000 mg | ORAL_TABLET | Freq: Every day | ORAL | Status: AC

## 2010-12-20 NOTE — Telephone Encounter (Signed)
Patient notified of below.  Will send new rx to [facility].  Advised to decrease his Aspirin to 81mg  daily.  Patient verbalized understanding.

## 2010-12-20 NOTE — Telephone Encounter (Signed)
OK to give 30 day refill

## 2010-12-25 MED ORDER — CLONAZEPAM 0.5 MG PO TABS: 0.5000 mg | ORAL_TABLET | Freq: Two times a day (BID) | ORAL | Status: DC | PRN

## 2010-12-25 NOTE — Telephone Encounter (Signed)
Left message on voicemail of patient that 30 day supply of clonazepam 0.5mg   #60 would be called to [facility] and he has to get future refills from PCP.

## 2011-02-06 ENCOUNTER — Ambulatory Visit: Payer: PRIVATE HEALTH INSURANCE | Admitting: Cardiology

## 2011-03-12 ENCOUNTER — Other Ambulatory Visit: Payer: Self-pay | Admitting: Cardiology

## 2011-04-05 ENCOUNTER — Ambulatory Visit: Payer: PRIVATE HEALTH INSURANCE | Admitting: Cardiology

## 2011-05-23 ENCOUNTER — Encounter: Payer: Self-pay | Admitting: Cardiology

## 2011-05-23 ENCOUNTER — Ambulatory Visit (INDEPENDENT_AMBULATORY_CARE_PROVIDER_SITE_OTHER): Payer: 59 | Admitting: Cardiology

## 2011-05-23 VITALS — BP 139/96 | HR 70 | Ht 71.0 in | Wt 178.0 lb

## 2011-05-23 DIAGNOSIS — E119 Type 2 diabetes mellitus without complications: Secondary | ICD-10-CM | POA: Insufficient documentation

## 2011-05-23 DIAGNOSIS — R768 Other specified abnormal immunological findings in serum: Secondary | ICD-10-CM | POA: Insufficient documentation

## 2011-05-23 DIAGNOSIS — C4491 Basal cell carcinoma of skin, unspecified: Secondary | ICD-10-CM | POA: Insufficient documentation

## 2011-05-23 DIAGNOSIS — R894 Abnormal immunological findings in specimens from other organs, systems and tissues: Secondary | ICD-10-CM

## 2011-05-23 DIAGNOSIS — I2510 Atherosclerotic heart disease of native coronary artery without angina pectoris: Secondary | ICD-10-CM | POA: Insufficient documentation

## 2011-05-23 DIAGNOSIS — G4700 Insomnia, unspecified: Secondary | ICD-10-CM | POA: Insufficient documentation

## 2011-05-23 DIAGNOSIS — F419 Anxiety disorder, unspecified: Secondary | ICD-10-CM | POA: Insufficient documentation

## 2011-05-23 DIAGNOSIS — I10 Essential (primary) hypertension: Secondary | ICD-10-CM | POA: Insufficient documentation

## 2011-05-23 DIAGNOSIS — F411 Generalized anxiety disorder: Secondary | ICD-10-CM

## 2011-05-23 MED ORDER — METFORMIN HCL 500 MG PO TABS: 500.0000 mg | ORAL_TABLET | Freq: Two times a day (BID) | ORAL | Status: DC

## 2011-05-23 MED ORDER — CLONAZEPAM 0.5 MG PO TABS: 0.5000 mg | ORAL_TABLET | Freq: Two times a day (BID) | ORAL | Status: DC | PRN

## 2011-05-23 NOTE — Progress Notes (Signed)
Derek Bottoms, MD, [facility] ABIM Board Certified in Adult Cardiovascular Medicine,Internal Medicine and Critical Care Medicine    CC: Followup patient with coronary artery disease  HPI:  The patient is a 51 year old male with history of coronary disease status post non-ST elevation myocardial infarction and drug eluting stents to the LAD. He reports no recurrent chest pain. He reports no orthopnea PND. He reports no palpitations or syncope. He has not required any nitroglycerin. He still has anxiety at times and takes approximately 3-4 times a week clonazepam. He also significant insomnia but this is well managed with trazodone at night although he does complain of a very dry mouth. His blood pressure is elevated in the office today. In the past the patient has declined therapy. However he has agreed to follow his blood pressure closely next couple weeks and e-mail me with results to see if we need to start him on antihypertensive medications. The patient also has identified a primary care physician with [facility] in one days future refills from his practice. From a cardiovascular perspective he is otherwise stable. He is planning to undergo an excision for a skin cancer on the back and has questioned whether he is safe to stop Plavix prior to this procedure. The stent is more than one year ago.  PMH: reviewed and listed in Problem List in Electronic Records (and see below) Past Medical History  Diagnosis Date  . Hypertension     Diagnosed Approx. 3 years ago (declined medical therapy at time  . Dyslipidemia   . Basal cell cancer     Chest Wall and back. Status post skin grafting  . Hepatitis B antibody positive 2006  . Viral pneumonia, unspecified 2004  . Tobacco use disorder   . Chest pain, unspecified     Cardiac Catheterization  . Other dyspnea and respiratory abnormality   . Other nonspecific abnormal cardiovascular system function study     Echocardiogram  during this hospitalization with an ejection fraction of 55%  . Type II or unspecified type diabetes mellitus without mention of complication, not stated as uncontrolled 2012  . Coronary artery disease     Status post non-ST elevation microinfarction and drug-eluting stent to the mid LAD December 2011  . Insomnia   . Anxiety    Past Surgical History  Procedure Date  . Finger surgery   . Wide excision with split-thickness skin graft of right chest wall   01/23/2010      Barbaraann Barthel, M.D.    Allergies/SH/FHX : available in Electronic Records for review  Allergies  Allergen Reactions  . Cimetidine    History   Social History  . Marital Status: Divorced    Spouse Name: N/A    Number of Children: 2  . Years of Education: N/A   Occupational History  . FIRE FIGHTER     [employer]   Social History Main Topics  . Smoking status: Former Smoker -- 1.0 packs/day for 28 years    Types: Cigarettes    Quit date: 05/08/2010  . Smokeless tobacco: Never Used  . Alcohol Use: Yes     Drinks occasional beer and wine  . Drug Use: No  . Sexually Active: Not on file   Other Topics Concern  . Not on file   Social History Narrative  . No narrative on file   No family history on file.  Medications: Current Outpatient Prescriptions  Medication Sig Dispense Refill  . aspirin  EC 81 MG tablet Take 1 tablet (81 mg total) by mouth daily.      . carvedilol (COREG) 6.25 MG tablet Take 1 tablet (6.25 mg total) by mouth 2 (two) times daily.  60 tablet  6  . Cinnamon 500 MG TABS Take 1,000 mg by mouth daily.      . clonazePAM (KLONOPIN) 0.5 MG tablet Take 1 tablet (0.5 mg total) by mouth 2 (two) times daily as needed.  30 tablet  0  . clopidogrel (PLAVIX) 75 MG tablet Take 1 tablet (75 mg total) by mouth daily.  30 tablet  6  . isosorbide mononitrate (IMDUR) 60 MG 24 hr tablet Take 1 tablet by mouth Daily.      . metFORMIN (GLUCOPHAGE) 500 MG tablet Take 1 tablet (500 mg  total) by mouth 2 (two) times daily.  60 tablet  0  . traZODone (DESYREL) 50 MG tablet Take 75 mg by mouth daily as needed.        ROS: No nausea or vomiting. No fever or chills.No melena or hematochezia.No bleeding.No claudication  Physical Exam: BP 139/96  Pulse 70  Ht 5\' 11"  (1.803 m)  Wt 178 lb (80.74 kg)  BMI 24.83 kg/m2 General: Well-nourished white male in no apparent distress Neck: Normal carotid upstroke no carotid bruit. No thyromegaly nonnodular thyroid. JVP is 5-6 cm Lungs: Clear breath sounds bilaterally. No wheezing Cardiac: Regular rate and rhythm with normal S1-S2 no murmur rubs or gallops Vascular: No edema. Normal distal pulses Skin: Warm and dry. Status post skin graft anterior chest wall and malignant lesion on the right back Physcologic: Normal affect  12lead ECG: Normal sinus rhythm with nonspecific ST-T wave changes Limited bedside ECHO:N/A   Patient Active Problem List  Diagnoses  . HYPERLIPIDEMIA-followup with his primary care practice   . NONDEPENDENT TOBACCO USE DISORDER  . UNSPECIFIED BACKACHE  . LIMB PAIN  . OTHER DYSPNEA AND RESPIRATORY ABNORMALITIES  . PERSONAL HISTORY OTHER MALIGNANT NEOPLASM SKIN  . Hypertension  . Basal cell cancer  . Hepatitis B antibody positive  . Type II or unspecified type diabetes mellitus without mention of complication, not stated as uncontrolled  . Coronary artery disease-status post non-ST elevation myocardial infarction with drug-eluting stent to the mid LAD December 2011   . Insomnia  . Anxiety    PLAN   I've given the patient a refill on his clonazepam which he does not abuse. I only gave him 30 tablets and then he can followup with his primary care practice for future refills if needed.  I also refilled the patient's metformin today because he still has established with his new primary care physician.  From a cardiac standpoint is stable. I asked him to make sure that blood work is done during his next  primary care physician visits because is on high-dose Lipitor at 80 mg a day.  The patient will also monitor his blood pressure over the next 2 weeks and e-mail me with the results and see if we need to start additional antihypertensive therapy.

## 2011-05-23 NOTE — Patient Instructions (Signed)
   Clonazepam 0.5mg  twice a day as needed - PMD to maintain refills in the future  Metformin 500mg  twice a day  - 30 day supply given - also to be followed by PMD in the future Your physician wants you to follow up in: 6 months.  You will receive a reminder letter in the mail one-two months in advance.  If you don't receive a letter, please call our office to schedule the follow up appointment

## 2011-06-06 ENCOUNTER — Ambulatory Visit: Payer: PRIVATE HEALTH INSURANCE | Admitting: Family Medicine

## 2011-06-06 ENCOUNTER — Other Ambulatory Visit: Payer: Self-pay | Admitting: *Deleted

## 2011-06-06 MED ORDER — ISOSORBIDE MONONITRATE ER 60 MG PO TB24: 60.0000 mg | ORAL_TABLET | Freq: Every day | ORAL | Status: DC

## 2011-07-10 ENCOUNTER — Other Ambulatory Visit: Payer: Self-pay | Admitting: Cardiology

## 2011-09-11 ENCOUNTER — Encounter: Payer: Self-pay | Admitting: Internal Medicine

## 2011-10-07 ENCOUNTER — Other Ambulatory Visit: Payer: Self-pay | Admitting: Cardiology

## 2011-10-08 ENCOUNTER — Ambulatory Visit (AMBULATORY_SURGERY_CENTER): Payer: 59

## 2011-10-08 VITALS — Ht 71.5 in | Wt 179.3 lb

## 2011-10-08 DIAGNOSIS — Z1211 Encounter for screening for malignant neoplasm of colon: Secondary | ICD-10-CM

## 2011-10-08 NOTE — Progress Notes (Signed)
Pt came into office today for his pre-visit prior to his colonoscopy with Dr Leone Payor on 10/22/11.Pt was on Plavix 75 mg daily I a left message for his cardiologist-Dr Lewayne Bunting to notify our office.I cancelled the pt;s appointment on 10/22/11 and advised the patient to call back to reschedule his appointment once we get a response from his cardiologist regarding his Plavix.Pt understood.Ulis Rias RN

## 2011-10-09 ENCOUNTER — Encounter: Payer: Self-pay | Admitting: Internal Medicine

## 2011-10-09 ENCOUNTER — Encounter: Payer: Self-pay | Admitting: Gastroenterology

## 2011-10-15 NOTE — Addendum Note (Signed)
Addended by: Maple Hudson on: 10/15/2011 11:57 AM   Modules accepted: Level of Service

## 2011-10-22 ENCOUNTER — Encounter: Payer: 59 | Admitting: Internal Medicine

## 2011-11-18 ENCOUNTER — Ambulatory Visit: Payer: 59 | Admitting: Internal Medicine

## 2012-01-06 ENCOUNTER — Other Ambulatory Visit: Payer: Self-pay | Admitting: Cardiology

## 2012-02-03 ENCOUNTER — Other Ambulatory Visit: Payer: Self-pay | Admitting: Cardiology

## 2012-02-03 MED ORDER — CARVEDILOL 6.25 MG PO TABS: 6.2500 mg | ORAL_TABLET | Freq: Two times a day (BID) | ORAL | Status: DC

## 2012-03-17 ENCOUNTER — Emergency Department (HOSPITAL_COMMUNITY): Payer: 59

## 2012-03-17 ENCOUNTER — Encounter (HOSPITAL_COMMUNITY): Payer: Self-pay

## 2012-03-17 ENCOUNTER — Inpatient Hospital Stay (HOSPITAL_COMMUNITY)
Admission: EM | Admit: 2012-03-17 | Discharge: 2012-03-21 | DRG: 287 | Disposition: A | Discharge status: Home / Self Care | Payer: 59 | Attending: Internal Medicine | Admitting: Internal Medicine

## 2012-03-17 DIAGNOSIS — F419 Anxiety disorder, unspecified: Secondary | ICD-10-CM

## 2012-03-17 DIAGNOSIS — Z85828 Personal history of other malignant neoplasm of skin: Secondary | ICD-10-CM

## 2012-03-17 DIAGNOSIS — I252 Old myocardial infarction: Secondary | ICD-10-CM

## 2012-03-17 DIAGNOSIS — E119 Type 2 diabetes mellitus without complications: Secondary | ICD-10-CM | POA: Diagnosis present

## 2012-03-17 DIAGNOSIS — I2510 Atherosclerotic heart disease of native coronary artery without angina pectoris: Secondary | ICD-10-CM

## 2012-03-17 DIAGNOSIS — I10 Essential (primary) hypertension: Secondary | ICD-10-CM | POA: Diagnosis present

## 2012-03-17 DIAGNOSIS — R0781 Pleurodynia: Secondary | ICD-10-CM | POA: Diagnosis present

## 2012-03-17 DIAGNOSIS — Z9861 Coronary angioplasty status: Secondary | ICD-10-CM

## 2012-03-17 DIAGNOSIS — E785 Hyperlipidemia, unspecified: Secondary | ICD-10-CM | POA: Diagnosis present

## 2012-03-17 DIAGNOSIS — Z87891 Personal history of nicotine dependence: Secondary | ICD-10-CM

## 2012-03-17 DIAGNOSIS — B1910 Unspecified viral hepatitis B without hepatic coma: Secondary | ICD-10-CM | POA: Diagnosis present

## 2012-03-17 DIAGNOSIS — Z7982 Long term (current) use of aspirin: Secondary | ICD-10-CM

## 2012-03-17 DIAGNOSIS — K529 Noninfective gastroenteritis and colitis, unspecified: Secondary | ICD-10-CM

## 2012-03-17 DIAGNOSIS — K5289 Other specified noninfective gastroenteritis and colitis: Secondary | ICD-10-CM | POA: Diagnosis present

## 2012-03-17 DIAGNOSIS — E871 Hypo-osmolality and hyponatremia: Secondary | ICD-10-CM | POA: Diagnosis present

## 2012-03-17 DIAGNOSIS — Z23 Encounter for immunization: Secondary | ICD-10-CM

## 2012-03-17 DIAGNOSIS — R079 Chest pain, unspecified: Secondary | ICD-10-CM

## 2012-03-17 DIAGNOSIS — R072 Precordial pain: Principal | ICD-10-CM | POA: Diagnosis present

## 2012-03-17 DIAGNOSIS — F411 Generalized anxiety disorder: Secondary | ICD-10-CM | POA: Diagnosis present

## 2012-03-17 DIAGNOSIS — G4700 Insomnia, unspecified: Secondary | ICD-10-CM | POA: Diagnosis present

## 2012-03-17 LAB — CBC WITH DIFFERENTIAL/PLATELET
Eosinophils Absolute: 0 10*3/uL (ref 0.0–0.7)
Eosinophils Relative: 0 % (ref 0–5)
HCT: 45.5 % (ref 39.0–52.0)
Lymphocytes Relative: 17 % (ref 12–46)
Lymphs Abs: 0.8 10*3/uL (ref 0.7–4.0)
MCH: 31.2 pg (ref 26.0–34.0)
MCV: 87.5 fL (ref 78.0–100.0)
Monocytes Absolute: 0.3 10*3/uL (ref 0.1–1.0)
Monocytes Relative: 7 % (ref 3–12)
Platelets: 175 10*3/uL (ref 150–400)
RBC: 5.2 MIL/uL (ref 4.22–5.81)

## 2012-03-17 LAB — BASIC METABOLIC PANEL
BUN: 19 mg/dL (ref 6–23)
CO2: 23 mEq/L (ref 19–32)
Calcium: 8.9 mg/dL (ref 8.4–10.5)
Creatinine, Ser: 0.8 mg/dL (ref 0.50–1.35)
GFR calc non Af Amer: >90 mL/min (ref >90)
Glucose, Bld: 186 mg/dL — ABNORMAL HIGH (ref 70–99)
Sodium: 133 mEq/L — ABNORMAL LOW (ref 135–145)

## 2012-03-17 LAB — PROTIME-INR
INR: 1.05 (ref 0.00–1.49)
Prothrombin Time: 13.6 seconds (ref 11.6–15.2)

## 2012-03-17 LAB — APTT: aPTT: 26 seconds (ref 24–37)

## 2012-03-17 LAB — TROPONIN I: Troponin I: <0.3 ng/mL (ref <0.30)

## 2012-03-17 MED ORDER — ONDANSETRON HCL 4 MG/2ML IJ SOLN
4.0000 mg | Freq: Once | INTRAMUSCULAR | Status: AC
  Administered 2012-03-17: 4 mg via INTRAVENOUS
  Filled 2012-03-17: qty 2

## 2012-03-17 MED ORDER — GI COCKTAIL ~~LOC~~
30.0000 mL | Freq: Once | ORAL | Status: AC
  Administered 2012-03-17: 30 mL via ORAL
  Filled 2012-03-17: qty 30

## 2012-03-17 MED ORDER — HYDROMORPHONE HCL PF 1 MG/ML IJ SOLN
1.0000 mg | Freq: Once | INTRAMUSCULAR | Status: AC
  Administered 2012-03-17: 1 mg via INTRAVENOUS
  Filled 2012-03-17: qty 1

## 2012-03-17 MED ORDER — NITROGLYCERIN 0.4 MG SL SUBL
0.4000 mg | SUBLINGUAL_TABLET | Freq: Once | SUBLINGUAL | Status: AC
  Administered 2012-03-17: 0.4 mg via SUBLINGUAL
  Filled 2012-03-17: qty 25

## 2012-03-17 MED ORDER — PANTOPRAZOLE SODIUM 40 MG IV SOLR
40.0000 mg | Freq: Once | INTRAVENOUS | Status: AC
  Administered 2012-03-17: 40 mg via INTRAVENOUS
  Filled 2012-03-17: qty 40

## 2012-03-17 MED ORDER — MORPHINE SULFATE 4 MG/ML IJ SOLN
4.0000 mg | Freq: Once | INTRAMUSCULAR | Status: AC
  Administered 2012-03-17: 4 mg via INTRAVENOUS
  Filled 2012-03-17: qty 1

## 2012-03-17 MED ORDER — MORPHINE SULFATE 4 MG/ML IJ SOLN
4.0000 mg | Freq: Once | INTRAMUSCULAR | Status: AC
  Administered 2012-03-17: 4 mg via INTRAVENOUS

## 2012-03-17 MED ORDER — MORPHINE SULFATE 4 MG/ML IJ SOLN
INTRAMUSCULAR | Status: AC
  Filled 2012-03-17: qty 1

## 2012-03-17 MED ORDER — ASPIRIN 81 MG PO CHEW
324.0000 mg | CHEWABLE_TABLET | Freq: Once | ORAL | Status: AC
  Administered 2012-03-17: 324 mg via ORAL
  Filled 2012-03-17: qty 4

## 2012-03-17 NOTE — ED Notes (Signed)
Chest pain that started last night. Took NTG today without relief

## 2012-03-17 NOTE — ED Provider Notes (Signed)
History   This chart was scribed for Derek Booze, MD, by Frederik Pear, ER scribe. The patient was seen in room APA06/APA06 and the patient's care was started at 1825.    CSN: [id number]  Arrival date & time 03/17/12  1814   First MD Initiated Contact with Patient 03/17/12 1825      Chief Complaint  Patient presents with  . Chest Pain    (Consider location/radiation/quality/duration/timing/severity/associated sxs/prior treatment) HPI Comments: Derek Chen is a 51 y.o. male with a h/o of MI who presents to the Emergency Department complaining of a constant, sharp, 8.5/10 chest pain with associated chest tightness, headache, emesis, diarrhea, and paresthesia of the left arm that began around 01:30 when he awoke from sleep due to the pain. He states that the pain today is similar to the pain when he had his first MI. He states that he took 2 nitroglycerins without relief. He states that he took an 81 mg aspirin today at 16:00 as well as his daily Plavix. He denies that the nitroglycerin did not burn when he took it.    PCP is Dr. Margo Aye.     Past Medical History  Diagnosis Date  . Hypertension     Diagnosed Approx. 3 years ago (declined medical therapy at time  . Dyslipidemia   . Basal cell cancer     Chest Wall and back. Status post skin grafting  . Hepatitis B antibody positive 2006  . Viral pneumonia, unspecified 2004  . Tobacco use disorder   . Chest pain, unspecified     Cardiac Catheterization  . Other dyspnea and respiratory abnormality   . Other nonspecific abnormal cardiovascular system function study     Echocardiogram during this hospitalization with an ejection fraction of 55%  . Type II or unspecified type diabetes mellitus without mention of complication, not stated as uncontrolled 2012  . Coronary artery disease     Status post non-ST elevation microinfarction and drug-eluting stent to the mid LAD December 2011  . Insomnia   . Anxiety   . Blood poisoning      Past Surgical History  Procedure Date  . Finger surgery   . Wide excision with split-thickness skin graft of right chest wall   01/23/2010      Barbaraann Barthel, M.D.  . Appendectomy   . Knee arthroscopy     left  . Heart cath with stint     Family History  Problem Relation Age of Onset  . Diabetes Mother     History  Substance Use Topics  . Smoking status: Former Smoker -- 1.0 packs/day for 28 years    Types: Cigarettes    Quit date: 05/08/2010  . Smokeless tobacco: Former Neurosurgeon  . Alcohol Use: 1.0 - 1.5 oz/week    2-3 drink(s) per week     Comment: Drinks occasional beer and wine      Review of Systems  All other systems reviewed and are negative.    Allergies  Cimetidine  Home Medications   Current Outpatient Rx  Name  Route  Sig  Dispense  Refill  . ASPIRIN EC 81 MG PO TBEC   Oral   Take 81 mg by mouth daily.         Marland Kitchen CARVEDILOL 6.25 MG PO TABS   Oral   Take 1 tablet (6.25 mg total) by mouth 2 (two) times daily with a meal.   60 tablet   0     Call office and  make 6 month follow up appointment   . CLONAZEPAM 1 MG PO TABS   Oral   Take 1 mg by mouth 2 (two) times daily as needed. For anxiety         . CLOPIDOGREL BISULFATE 75 MG PO TABS   Oral   Take 75 mg by mouth daily.         . ISOSORBIDE MONONITRATE ER 60 MG PO TB24   Oral   Take 1 tablet (60 mg total) by mouth daily.   30 tablet   6   . METFORMIN HCL 1000 MG PO TABS   Oral   Take 1,000 mg by mouth 2 (two) times daily.         Marland Kitchen NITROGLYCERIN 0.4 MG SL SUBL   Sublingual   Place 0.4 mg under the tongue every 5 (five) minutes as needed.         Marland Kitchen PRAVASTATIN SODIUM 20 MG PO TABS   Oral   Take 20 mg by mouth every morning.          . TRAZODONE HCL 50 MG PO TABS   Oral   Take 75 mg by mouth daily as needed. For sleep           BP 133/90  Pulse 59  Temp 97.9 F (36.6 C) (Oral)  Resp 21  Ht 5' 11.5" (1.816 m)  Wt 176 lb (79.833 kg)  BMI 24.20 kg/m2   SpO2 96%  Physical Exam  Nursing note and vitals reviewed. Constitutional: He is oriented to person, place, and time. He appears well-developed and well-nourished. No distress.  HENT:  Head: Normocephalic and atraumatic.  Eyes: EOM are normal. Pupils are equal, round, and reactive to light.  Neck: Normal range of motion. Neck supple. No tracheal deviation present.  Cardiovascular: Normal rate.   Pulmonary/Chest: Effort normal. No respiratory distress.       He has mild tenderness to the left parasternal area.    Abdominal: Soft. He exhibits no distension.       He has mild LUQ tenderness.  Musculoskeletal: Normal range of motion. He exhibits no edema.  Neurological: He is alert and oriented to person, place, and time.  Skin: Skin is warm and dry.  Psychiatric: He has a normal mood and affect. His behavior is normal.    ED Course  Procedures (including critical care time)  DIAGNOSTIC STUDIES: Oxygen Saturation is 99% on room air, normal by my interpretation.    COORDINATION OF CARE:  18:36- Discussed planned course of treatment with the patient, including aspirin, ondansetron,a chest X-ray, and blood work, who is agreeable at this time.  18:45- Medication Orders- aspirin chewable tablet 324 mg- Once, ondansetron (Zofran) injection 4 mg- Once.  Results for orders placed during the hospital encounter of 03/17/12  CBC WITH DIFFERENTIAL      Component Value Range   WBC 4.8  4.0 - 10.5 K/uL   RBC 5.20  4.22 - 5.81 MIL/uL   Hemoglobin 16.2  13.0 - 17.0 g/dL   HCT 16.1  09.6 - 04.5 %   MCV 87.5  78.0 - 100.0 fL   MCH 31.2  26.0 - 34.0 pg   MCHC 35.6  30.0 - 36.0 g/dL   RDW 40.9  81.1 - 91.4 %   Platelets 175  150 - 400 K/uL   Neutrophils Relative 76  43 - 77 %   Neutro Abs 3.7  1.7 - 7.7 K/uL   Lymphocytes Relative 17  12 -  46 %   Lymphs Abs 0.8  0.7 - 4.0 K/uL   Monocytes Relative 7  3 - 12 %   Monocytes Absolute 0.3  0.1 - 1.0 K/uL   Eosinophils Relative 0  0 - 5 %    Eosinophils Absolute 0.0  0.0 - 0.7 K/uL   Basophils Relative 0  0 - 1 %   Basophils Absolute 0.0  0.0 - 0.1 K/uL  BASIC METABOLIC PANEL      Component Value Range   Sodium 133 (*) 135 - 145 mEq/L   Potassium 3.7  3.5 - 5.1 mEq/L   Chloride 99  96 - 112 mEq/L   CO2 23  19 - 32 mEq/L   Glucose, Bld 186 (*) 70 - 99 mg/dL   BUN 19  6 - 23 mg/dL   Creatinine, Ser 2.13  0.50 - 1.35 mg/dL   Calcium 8.9  8.4 - 08.6 mg/dL   GFR calc non Af Amer >90  >90 mL/min   GFR calc Af Amer >90  >90 mL/min  PROTIME-INR      Component Value Range   Prothrombin Time 13.6  11.6 - 15.2 seconds   INR 1.05  0.00 - 1.49  APTT      Component Value Range   aPTT 26  24 - 37 seconds  TROPONIN I      Component Value Range   Troponin I <0.30  <0.30 ng/mL  D-DIMER, QUANTITATIVE      Component Value Range   D-Dimer, Quant 0.36  0.00 - 0.48 ug/mL-FEU   Dg Chest Portable 1 View  03/17/2012  *RADIOLOGY REPORT*  Clinical Data: Chest pain.  PORTABLE CHEST - 1 VIEW  Comparison: None.  Findings: Low lung volumes. Heart and mediastinal contours are within normal limits.  No focal opacities or effusions.  No acute bony abnormality.  IMPRESSION: No active cardiopulmonary disease.   Original Report Authenticated By: Charlett Nose, M.D.     Date: 03/17/2012  1828  Rate: 99  Rhythm: normal sinus rhythm  QRS Axis: left  Intervals: normal  ST/T Wave abnormalities: normal  Conduction Disutrbances:left anterior fascicular block  Narrative Interpretation: Left anterior fascicular block. When compared with ECG of 03/30/2010, there is been resolution of T wave inversions in the anterior and anterolateral leads.  Old EKG Reviewed: changes noted   Date: 03/17/2012  2300  Rate: 79  Rhythm: normal sinus rhythm  QRS Axis: left  Intervals: normal  ST/T Wave abnormalities: normal  Conduction Disutrbances:left anterior fascicular block  Narrative Interpretation: Left anterior fascicular block. No change from prior ECG per  Old  EKG Reviewed: unchanged     1. Chest pain       MDM  Chest pain in patient with known coronary artery disease. Alternatives were reviewed, and he did have a non-STEMI in 2011. He'll need serial cardiac markers and pain we'll need to be controlled.  He did not get relief from nitroglycerin or GI cocktail or pantoprazole. He was given morphine and Dilaudid and is still failing to get pain relief. Workup is unremarkable including negative troponin and negative d-dimer. ECGs did not show acute changes and actually shows significant resolution of T wave changes of non-STEMI from prior ECG. Case will be signed out to Dr. Colon Branch for followup of pain control and serial troponins.  I personally performed the services described in this documentation, which was scribed in my presence. The recorded information has been reviewed and is accurate.  Derek Booze, MD 03/18/12 [phone]

## 2012-03-18 ENCOUNTER — Encounter (HOSPITAL_COMMUNITY): Payer: Self-pay | Admitting: Internal Medicine

## 2012-03-18 DIAGNOSIS — R079 Chest pain, unspecified: Secondary | ICD-10-CM

## 2012-03-18 DIAGNOSIS — K529 Noninfective gastroenteritis and colitis, unspecified: Secondary | ICD-10-CM | POA: Diagnosis present

## 2012-03-18 DIAGNOSIS — F411 Generalized anxiety disorder: Secondary | ICD-10-CM

## 2012-03-18 DIAGNOSIS — R072 Precordial pain: Secondary | ICD-10-CM

## 2012-03-18 DIAGNOSIS — R0781 Pleurodynia: Secondary | ICD-10-CM | POA: Diagnosis present

## 2012-03-18 DIAGNOSIS — I2510 Atherosclerotic heart disease of native coronary artery without angina pectoris: Secondary | ICD-10-CM

## 2012-03-18 DIAGNOSIS — K5289 Other specified noninfective gastroenteritis and colitis: Secondary | ICD-10-CM

## 2012-03-18 DIAGNOSIS — I10 Essential (primary) hypertension: Secondary | ICD-10-CM

## 2012-03-18 LAB — COMPREHENSIVE METABOLIC PANEL
ALT: 24 U/L (ref 0–53)
AST: 14 U/L (ref 0–37)
CO2: 20 mEq/L (ref 19–32)
Calcium: 8 mg/dL — ABNORMAL LOW (ref 8.4–10.5)
Chloride: 103 mEq/L (ref 96–112)
GFR calc non Af Amer: >90 mL/min (ref >90)
Sodium: 133 mEq/L — ABNORMAL LOW (ref 135–145)
Total Bilirubin: 0.4 mg/dL (ref 0.3–1.2)

## 2012-03-18 LAB — URINALYSIS, ROUTINE W REFLEX MICROSCOPIC
Leukocytes, UA: NEGATIVE
Nitrite: NEGATIVE
Specific Gravity, Urine: >1.03 — ABNORMAL HIGH (ref 1.005–1.030)
Urobilinogen, UA: 0.2 mg/dL (ref 0.0–1.0)

## 2012-03-18 LAB — CBC
Platelets: 167 10*3/uL (ref 150–400)
RBC: 4.78 MIL/uL (ref 4.22–5.81)
WBC: 4.4 10*3/uL (ref 4.0–10.5)

## 2012-03-18 LAB — TSH: TSH: 1.264 u[IU]/mL (ref 0.350–4.500)

## 2012-03-18 LAB — GLUCOSE, CAPILLARY
Glucose-Capillary: 128 mg/dL — ABNORMAL HIGH (ref 70–99)
Glucose-Capillary: 109 mg/dL — ABNORMAL HIGH (ref 70–99)
Glucose-Capillary: 111 mg/dL — ABNORMAL HIGH (ref 70–99)

## 2012-03-18 LAB — TROPONIN I: Troponin I: <0.3 ng/mL (ref <0.30)

## 2012-03-18 MED ORDER — HYDROMORPHONE HCL PF 1 MG/ML IJ SOLN
1.0000 mg | INTRAMUSCULAR | Status: DC | PRN
Start: 2012-03-18 — End: 2012-03-21
  Administered 2012-03-18 – 2012-03-20 (×11): 1 mg via INTRAVENOUS
  Filled 2012-03-18 (×11): qty 1

## 2012-03-18 MED ORDER — HYDROMORPHONE HCL PF 1 MG/ML IJ SOLN
1.0000 mg | Freq: Once | INTRAMUSCULAR | Status: AC
  Administered 2012-03-18: 1 mg via INTRAVENOUS
  Filled 2012-03-18: qty 1

## 2012-03-18 MED ORDER — POTASSIUM CHLORIDE IN NACL 20-0.9 MEQ/L-% IV SOLN
INTRAVENOUS | Status: DC
  Administered 2012-03-18: 100 mL/h via INTRAVENOUS

## 2012-03-18 MED ORDER — ROSUVASTATIN CALCIUM 5 MG PO TABS
5.0000 mg | ORAL_TABLET | Freq: Every day | ORAL | Status: DC
  Administered 2012-03-18 – 2012-03-20 (×3): 5 mg via ORAL
  Filled 2012-03-18 (×4): qty 1

## 2012-03-18 MED ORDER — ASPIRIN EC 81 MG PO TBEC
81.0000 mg | DELAYED_RELEASE_TABLET | Freq: Every day | ORAL | Status: DC
  Administered 2012-03-18: 81 mg via ORAL
  Filled 2012-03-18: qty 1

## 2012-03-18 MED ORDER — INSULIN ASPART 100 UNIT/ML ~~LOC~~ SOLN
0.0000 [IU] | Freq: Every day | SUBCUTANEOUS | Status: DC
Start: 2012-03-18 — End: 2012-03-21

## 2012-03-18 MED ORDER — INSULIN ASPART 100 UNIT/ML ~~LOC~~ SOLN
0.0000 [IU] | Freq: Three times a day (TID) | SUBCUTANEOUS | Status: DC
  Administered 2012-03-18: 2 [IU] via SUBCUTANEOUS
  Administered 2012-03-18 – 2012-03-20 (×3): 1 [IU] via SUBCUTANEOUS
  Administered 2012-03-21 (×2): 2 [IU] via SUBCUTANEOUS

## 2012-03-18 MED ORDER — TRAZODONE HCL 50 MG PO TABS: 75.0000 mg | ORAL_TABLET | Freq: Every evening | ORAL | Status: DC | PRN

## 2012-03-18 MED ORDER — NITROGLYCERIN 2 % TD OINT
1.0000 [in_us] | TOPICAL_OINTMENT | Freq: Four times a day (QID) | TRANSDERMAL | Status: DC
  Administered 2012-03-18: 1 [in_us] via TOPICAL

## 2012-03-18 MED ORDER — NON FORMULARY: 5.0000 mg | Freq: Every day | Status: DC

## 2012-03-18 MED ORDER — LORAZEPAM 1 MG PO TABS
1.0000 mg | ORAL_TABLET | Freq: Every evening | ORAL | Status: DC | PRN
  Administered 2012-03-19: 1 mg via ORAL
  Filled 2012-03-18: qty 1

## 2012-03-18 MED ORDER — NAPROXEN 375 MG PO TABS
375.0000 mg | ORAL_TABLET | Freq: Three times a day (TID) | ORAL | Status: DC
  Administered 2012-03-18 – 2012-03-21 (×7): 375 mg via ORAL
  Filled 2012-03-18: qty 1
  Filled 2012-03-18: qty 2
  Filled 2012-03-18: qty 1
  Filled 2012-03-18 (×2): qty 2
  Filled 2012-03-18: qty 1
  Filled 2012-03-18: qty 2
  Filled 2012-03-18 (×2): qty 1

## 2012-03-18 MED ORDER — ENOXAPARIN SODIUM 40 MG/0.4ML ~~LOC~~ SOLN
40.0000 mg | SUBCUTANEOUS | Status: DC
  Administered 2012-03-18: 40 mg via SUBCUTANEOUS
  Filled 2012-03-18 (×2): qty 0.4

## 2012-03-18 MED ORDER — ACETAMINOPHEN 325 MG PO TABS: 650.0000 mg | ORAL_TABLET | ORAL | Status: DC | PRN

## 2012-03-18 MED ORDER — PNEUMOCOCCAL VAC POLYVALENT 25 MCG/0.5ML IJ INJ
0.5000 mL | INJECTION | Freq: Once | INTRAMUSCULAR | Status: AC
  Administered 2012-03-18: 0.5 mL via INTRAMUSCULAR
  Filled 2012-03-18: qty 0.5

## 2012-03-18 MED ORDER — ISOSORBIDE MONONITRATE ER 60 MG PO TB24
60.0000 mg | ORAL_TABLET | Freq: Every day | ORAL | Status: DC
  Administered 2012-03-18 – 2012-03-21 (×3): 60 mg via ORAL
  Filled 2012-03-18 (×2): qty 1

## 2012-03-18 MED ORDER — KETOROLAC TROMETHAMINE 30 MG/ML IJ SOLN
30.0000 mg | Freq: Once | INTRAMUSCULAR | Status: AC
  Administered 2012-03-18: 30 mg via INTRAVENOUS
  Filled 2012-03-18: qty 1

## 2012-03-18 MED ORDER — CLOPIDOGREL BISULFATE 75 MG PO TABS
75.0000 mg | ORAL_TABLET | Freq: Every day | ORAL | Status: DC
  Administered 2012-03-18 – 2012-03-21 (×2): 75 mg via ORAL
  Filled 2012-03-18 (×2): qty 1

## 2012-03-18 MED ORDER — CARVEDILOL 6.25 MG PO TABS
6.2500 mg | ORAL_TABLET | Freq: Two times a day (BID) | ORAL | Status: DC
  Administered 2012-03-18 – 2012-03-21 (×5): 6.25 mg via ORAL
  Filled 2012-03-18 (×3): qty 1
  Filled 2012-03-18 (×3): qty 2
  Filled 2012-03-18: qty 1

## 2012-03-18 MED ORDER — SODIUM CHLORIDE 0.9 % IJ SOLN
3.0000 mL | Freq: Two times a day (BID) | INTRAMUSCULAR | Status: DC
  Administered 2012-03-19 – 2012-03-20 (×2): 3 mL via INTRAVENOUS

## 2012-03-18 MED ORDER — LORAZEPAM 2 MG/ML IJ SOLN
1.0000 mg | Freq: Once | INTRAMUSCULAR | Status: AC
  Administered 2012-03-18: 1 mg via INTRAVENOUS
  Filled 2012-03-18: qty 1

## 2012-03-18 MED ORDER — LORAZEPAM 0.5 MG PO TABS
0.5000 mg | ORAL_TABLET | Freq: Two times a day (BID) | ORAL | Status: DC | PRN
  Administered 2012-03-18: 0.5 mg via ORAL
  Filled 2012-03-18: qty 1

## 2012-03-18 MED ORDER — NITROGLYCERIN 2 % TD OINT
1.0000 [in_us] | TOPICAL_OINTMENT | Freq: Once | TRANSDERMAL | Status: AC
  Administered 2012-03-18: 1 [in_us] via TOPICAL
  Filled 2012-03-18: qty 1

## 2012-03-18 MED ORDER — PANTOPRAZOLE SODIUM 40 MG PO PACK
40.0000 mg | PACK | Freq: Every day | ORAL | Status: DC
  Filled 2012-03-18 (×3): qty 20

## 2012-03-18 MED ORDER — ONDANSETRON HCL 4 MG/2ML IJ SOLN: 4.0000 mg | INTRAMUSCULAR | Status: DC | PRN

## 2012-03-18 MED ORDER — METFORMIN HCL 500 MG PO TABS
1000.0000 mg | ORAL_TABLET | Freq: Two times a day (BID) | ORAL | Status: DC
  Administered 2012-03-18 (×2): 1000 mg via ORAL
  Filled 2012-03-18 (×2): qty 2

## 2012-03-18 MED ORDER — CLONAZEPAM 1 MG PO TABS: 1.0000 mg | ORAL_TABLET | Freq: Two times a day (BID) | ORAL | Status: DC | PRN

## 2012-03-18 MED ORDER — HYDROMORPHONE HCL PF 2 MG/ML IJ SOLN
2.0000 mg | Freq: Once | INTRAMUSCULAR | Status: AC
  Administered 2012-03-18: 2 mg via INTRAVENOUS
  Filled 2012-03-18: qty 1

## 2012-03-18 NOTE — Care Management Note (Unsigned)
    Page 1 of 1   03/18/2012     11:12:08 AM   CARE MANAGEMENT NOTE 03/18/2012  Patient:  Derek Chen, Derek Chen   Account Number:  [id number]  Date Initiated:  03/18/2012  Documentation initiated by:  Rosemary Holms  Subjective/Objective Assessment:   Spoke to pt and wife at bedside. Pt in a lot of pain and they state that the symptoms sound consistent with Paricarditis. Spoke about [facility] RN and IV antibiotics in home if needed. Both were open to this if that is what is decided.     Action/Plan:   RCM will follow as Diagnosis is clarified. Live in [place].   Anticipated DC Date:  03/20/2012   Anticipated DC Plan:  HOME W HOME HEALTH SERVICES      DC Planning Services  CM consult      Choice offered to / List presented to:             Status of service:  In process, will continue to follow Medicare Important Message given?   (If response is "NO", the following Medicare IM given date fields will be blank) Date Medicare IM given:   Date Additional Medicare IM given:    Discharge Disposition:    Per UR Regulation:    If discussed at Long Length of Stay Meetings, dates discussed:    Comments:  03/18/12 1030 Dominico Rod RN BSN CM

## 2012-03-18 NOTE — H&P (Signed)
[facility] History and Physical  Derek Chen  ZOX:[id number]  DOB: 06/03/1960   DOA: 03/18/2012   PCP:   [facility] /Dr. Rudi Heap  Chief Complaint:  Chest pain since 1:30 AM early Tuesday  HPI: Derek Chen is an 51 y.o. male.   Middle-aged Caucasian gentleman with coronary artery disease status post drug-eluting stent, compliant with Plavix, presents with central chest pain which started over 24 hours ago, rated as 8.5 out 10, associated with nausea associated with numbness of the left arm.  He did not take any treatment until much later that day he took 2 sublingual nitroglycerin which gave no relief, and eventually his wife drove him to the emergency room where nitroglycerin was found to be expired; she received nitroglycerin in the emergency room without relief.  He has had relief from intravenous morphine and Dilaudid and particularly from Dilaudid 2 mg. He had no relief with GI cocktail. The pain is sharp and aggravated by breathing and now located mostly over the precordium. She is known to suffer from anxiety.  On further questioning symptoms began with nausea vomiting and diarrhea; he has vomited about twice, and had 6 loose stools. No blood in the vomitus or diarrhea; he has no sick contacts; he has no respiratory symptoms; he took Imodium for the diarrhea at home, and has had no further diarrhea for 8 hours that he's been in the emergency room. His chest pain persists  Rewiew of Systems:   All systems negative except as marked bold or noted in the HPI;  Constitutional:  malaise, fever and chills. ;  Eyes: eye pain, redness and discharge. ;  ENMT: ear pain, hoarseness, nasal congestion, sinus pressure and sore throat. ;  Cardiovascular:   palpitations, diaphoresis, dyspnea and peripheral edema. ;  Respiratory:  cough, hemoptysis, wheezing and stridor. ;  Gastrointestinal:  nausea, vomiting, diarrhea, constipation, abdominal pain, melena, blood in stool,  hematemesis, jaundice and rectal bleeding. unusual weight loss..   Genitourinary: r frequency, dysuria, incontinence,flank pain and hematuria; Musculoskeletal:  back pain and neck pain.  swelling and trauma.;  Skin: .  pruritus, rash, abrasions, bruising and skin lesion.; ulcerations Neuro:  headache, lightheadedness and neck stiffness.  weakness, altered level of consciousness , altered mental status, extremity weakness, burning feet, involuntary movement, seizure and syncope.  Psych:  anxiety, depression, insomnia, tearfulness, panic attacks, hallucinations, paranoia, suicidal or homicidal ideation    Past Medical History  Diagnosis Date  . Hypertension     Diagnosed Approx. 3 years ago (declined medical therapy at time  . Dyslipidemia   . Basal cell cancer     Chest Wall and back. Status post skin grafting  . Hepatitis B antibody positive 2006  . Viral pneumonia, unspecified 2004  . Tobacco use disorder   . Chest pain, unspecified     Cardiac Catheterization  . Other dyspnea and respiratory abnormality   . Other nonspecific abnormal cardiovascular system function study     Echocardiogram during this hospitalization with an ejection fraction of 55%  . Type II or unspecified type diabetes mellitus without mention of complication, not stated as uncontrolled 2012  . Coronary artery disease     Status post non-ST elevation microinfarction and drug-eluting stent to the mid LAD December 2011  . Insomnia   . Anxiety   . Blood poisoning   . Diabetes     Past Surgical History  Procedure Date  . Finger surgery   . Wide excision with split-thickness skin  graft of right chest wall   01/23/2010      Barbaraann Barthel, M.D.  . Appendectomy   . Knee arthroscopy     left  . Heart cath with stint     Medications:  HOME MEDS: Prior to Admission medications   Medication Sig Start Date End Date Taking? Authorizing Provider  aspirin EC 81 MG tablet Take 81 mg by mouth daily.   Yes  Historical Provider, MD  carvedilol (COREG) 6.25 MG tablet Take 1 tablet (6.25 mg total) by mouth 2 (two) times daily with a meal. 02/03/12  Yes Prescott Parma, PA  clonazePAM (KLONOPIN) 1 MG tablet Take 1 mg by mouth 2 (two) times daily as needed. For anxiety   Yes Historical Provider, MD  clopidogrel (PLAVIX) 75 MG tablet Take 75 mg by mouth daily.   Yes Historical Provider, MD  isosorbide mononitrate (IMDUR) 60 MG 24 hr tablet Take 1 tablet (60 mg total) by mouth daily. 06/06/11  Yes June Leap, MD  metFORMIN (GLUCOPHAGE) 1000 MG tablet Take 1,000 mg by mouth 2 (two) times daily.   Yes Historical Provider, MD  nitroGLYCERIN (NITROSTAT) 0.4 MG SL tablet Place 0.4 mg under the tongue every 5 (five) minutes as needed.   Yes Historical Provider, MD  pravastatin (PRAVACHOL) 20 MG tablet Take 20 mg by mouth every morning.    Yes Historical Provider, MD  traZODone (DESYREL) 50 MG tablet Take 75 mg by mouth daily as needed. For sleep   Yes Historical Provider, MD     Allergies:  Allergies  Allergen Reactions  . Cimetidine Other (See Comments)    Severe headache    Social History:   reports that he quit smoking about 22 months ago. His smoking use included Cigarettes. He has a 28 pack-year smoking history. He has quit using smokeless tobacco. He reports that he drinks about 1 - 1.5 ounces of alcohol per week. He reports that he does not use illicit drugs.  Family History: Family History  Problem Relation Age of Onset  . Diabetes Mother      Physical Exam: Filed Vitals:   03/18/12 0100 03/18/12 0200 03/18/12 0300 03/18/12 0405  BP: 115/81 135/89 155/92 130/84  Pulse: 79 84 84 76  Temp:    98 F (36.7 C)  TempSrc:    Oral  Resp: 9 8 8 16   Height:    5' 11.5" (1.816 m)  Weight:    79.8 kg (175 lb 14.8 oz)  SpO2: 91% 95% 97% 96%   Blood pressure 130/84, pulse 76, temperature 98 F (36.7 C), temperature source Oral, resp. rate 16, height 5' 11.5" (1.816 m), weight 79.8 kg (175 lb 14.8  oz), SpO2 96.00%.  GEN:  Anxious looking middle-aged Caucasian gentleman lying in the stretcher in no acute distress; cooperative with exam PSYCH:  alert and oriented x4;  affect is appropriate. HEENT: Mucous membranes pink, dry and anicteric; PERRLA; EOM intact; no cervical lymphadenopathy nor thyromegaly or carotid bruit; no JVD; Breasts:: Not examined CHEST WALL: Recent reducible chest wall tenderness, more so over the precordium CHEST: Normal respiration, clear to auscultation bilaterally HEART: Regular rate and rhythm; no murmurs rubs or gallops BACK: No kyphosis or scoliosis; no CVA tenderness ABDOMEN: , soft non-tender; no masses, no organomegaly, normal abdominal bowel sounds; no pannus; no intertriginous candida. Rectal Exam: Not done EXTREMITIES: ; age-appropriate arthropathy of the hands and knees; left leg slightly smaller than the right this is chronic; no edema; no ulcerations. Genitalia: not  examined PULSES: 2+ and symmetric SKIN: Normal hydration no rash or ulceration CNS: Cranial nerves 2-12 grossly intact no focal lateralizing neurologic deficit   Labs on Admission:  Basic Metabolic Panel:  Lab 03/17/12 1610  NA 133*  K 3.7  CL 99  CO2 23  GLUCOSE 186*  BUN 19  CREATININE 0.80  CALCIUM 8.9  MG --  PHOS --   Liver Function Tests: No results found for this basename: AST:5,ALT:5,ALKPHOS:5,BILITOT:5,PROT:5,ALBUMIN:5 in the last 168 hours No results found for this basename: LIPASE:5,AMYLASE:5 in the last 168 hours No results found for this basename: AMMONIA:5 in the last 168 hours CBC:  Lab 03/17/12 1837  WBC 4.8  NEUTROABS 3.7  HGB 16.2  HCT 45.5  MCV 87.5  PLT 175   Cardiac Enzymes:  Lab 03/18/12 0312 03/18/12 0024 03/17/12 1837  CKTOTAL -- -- --  CKMB -- -- --  CKMBINDEX -- -- --  TROPONINI <0.30 <0.30 <0.30   BNP: No components found with this basename: POCBNP:5 D-dimer: No components found with this basename: D-DIMER:5 CBG:  Lab 03/18/12  0204  GLUCAP 149*    Radiological Exams on Admission: Dg Chest Portable 1 View  03/17/2012  *RADIOLOGY REPORT*  Clinical Data: Chest pain.  PORTABLE CHEST - 1 VIEW  Comparison: None.  Findings: Low lung volumes. Heart and mediastinal contours are within normal limits.  No focal opacities or effusions.  No acute bony abnormality.  IMPRESSION: No active cardiopulmonary disease.   Original Report Authenticated By: Charlett Nose, M.D.     EKG: Independently reviewed. No acute changes   Assessment/Plan Present on Admission:  . GE (gastroenteritis) . Pleuritic chest pain . HYPERLIPIDEMIA . Anxiety . Coronary artery disease . Type II or unspecified type diabetes mellitus without mention of complication, not stated as uncontrolled   PLAN: This gentleman his cardiac enzymes remain -24 hours after the onset of the pain, and her symptoms were associated with signs of gastroenteritis;  Atypical for cardiac disease but because of risk factors and his anxiety we'll bring him in overnight for another set of cardiac enzymes, he can be evaluated by his cardiologist and likely will be discharged home later today, unless they think he needs cardiac catheterization  Other plans as per orders.  Code Status: FULL CODE  Family Communication: Wife present at the interview and examination Disposition Plan: And depending on the followup cardiac enzyme    Derek Chen Nocturnist [facility] Pager [phone]   03/18/2012, 4:09 AM

## 2012-03-18 NOTE — Progress Notes (Signed)
*  PRELIMINARY RESULTS* Echocardiogram 2D Echocardiogram has been performed.  Derek Chen 03/18/2012, 3:01 PM

## 2012-03-18 NOTE — Progress Notes (Signed)
UR Chart Review Completed  

## 2012-03-18 NOTE — Consult Note (Addendum)
Reason for Consult:chest pain Referring Physician:Sullivan  Derek Chen is an 51 y.o. male.  HPI: this is a 51 year old male patient of Dr. Andee Lineman who suffered a non-ST elevation MI treated with drug-eluting stent to the mid LAD in December 2011. Ejection fraction is 55%. He also has a history of hyperlipidemia and anxiety.  The patient presented to the emergency room last night with chest pain after a day of nausea, vomiting and diarrhea. His chest pain was sharp and unrelieved with nitroglycerin or GI cocktail. The pain finally eased with morphine and Dilaudid. Cardiac enzymes are negative. Now the patient states his chest pain is a tightness and he doesn't feel like he can get a deep breath. His chest pain also has a sharp component and is constant since yesterday afternoon. He's worried it could be his stent. He denies dizziness or presyncope. His BP was 148/100 and pulse 115 when he began having the pain. He still feels nauseated. He denies fever or cough. He denies any exertional chest tightness. He feels like he can't get a complete breath with exertion, but O2 sats are ok.  Cardiac catheterization in 03/2010:25% distal left main stenosis, 40% ostial, 99% mid, and 50% distal LAD, 25% ostial and mid circumflex, 60% OM1, 30% RCA, normal EF.  Past Medical History  Diagnosis Date  . Hypertension     Diagnosed Approx. 3 years ago (declined medical therapy at time  . Dyslipidemia   . Basal cell cancer     Chest Wall and back. Status post skin grafting  . Hepatitis B antibody positive 2006  . Viral pneumonia, unspecified 2004  . Tobacco use disorder   . Chest pain, unspecified     Cardiac Catheterization  . Other dyspnea and respiratory abnormality   . Other nonspecific abnormal cardiovascular system function study     Echocardiogram during this hospitalization with an ejection fraction of 55%  . Type II or unspecified type diabetes mellitus without mention of complication, not stated as  uncontrolled 2012  . Coronary artery disease     Status post non-ST elevation microinfarction and drug-eluting stent to the mid LAD December 2011  . Insomnia   . Anxiety   . Blood poisoning   . Diabetes    Past Surgical History  Procedure Date  . Finger surgery   . Skin graft split thickness trunk   01/23/2010     Right chest;  Barbaraann Barthel, M.D.  . Appendectomy   . Knee arthroscopy     left   Family History  Problem Relation Age of Onset  . Diabetes Mother    Social History:  reports that he quit smoking about 22 months ago. His smoking use included Cigarettes. He has a 28 pack-year smoking history. He has quit using smokeless tobacco. He reports that he drinks about 1 - 1.5 ounces of alcohol per week. He reports that he does not use illicit drugs.  Allergies:  Allergies  Allergen Reactions  . Cimetidine Other (See Comments)    Severe headache   Medications:  Scheduled Meds:   . aspirin EC  81 mg Oral Daily  . carvedilol  6.25 mg Oral BID WC  . clopidogrel  75 mg Oral Daily  . enoxaparin (LOVENOX) injection  40 mg Subcutaneous Q24H  . insulin aspart  0-5 Units Subcutaneous QHS  . insulin aspart  0-9 Units Subcutaneous TID WC  . isosorbide mononitrate  60 mg Oral Daily  . metFORMIN  1,000 mg Oral BID  .  nitroGLYCERIN  1 inch Topical Q6H  . pneumococcal 23 valent vaccine  0.5 mL Intramuscular Once  . rosuvastatin  5 mg Oral QHS  . sodium chloride  3 mL Intravenous Q12H  PRN Meds:.acetaminophen, clonazePAM, ondansetron (ZOFRAN) IV, traZODone  Results for orders placed during the hospital encounter of 03/17/12 (from the past 48 hour(s))  CBC WITH DIFFERENTIAL     Status: Normal   Collection Time   03/17/12  6:37 PM      Component Value Range Comment   WBC 4.8  4.0 - 10.5 K/uL    RBC 5.20  4.22 - 5.81 MIL/uL    Hemoglobin 16.2  13.0 - 17.0 g/dL    HCT 16.1  09.6 - 04.5 %    MCV 87.5  78.0 - 100.0 fL    MCH 31.2  26.0 - 34.0 pg    MCHC 35.6  30.0 - 36.0 g/dL     RDW 40.9  81.1 - 91.4 %    Platelets 175  150 - 400 K/uL    Neutrophils Relative 76  43 - 77 %    Neutro Abs 3.7  1.7 - 7.7 K/uL    Lymphocytes Relative 17  12 - 46 %    Lymphs Abs 0.8  0.7 - 4.0 K/uL    Monocytes Relative 7  3 - 12 %    Monocytes Absolute 0.3  0.1 - 1.0 K/uL    Eosinophils Relative 0  0 - 5 %    Eosinophils Absolute 0.0  0.0 - 0.7 K/uL    Basophils Relative 0  0 - 1 %    Basophils Absolute 0.0  0.0 - 0.1 K/uL   BASIC METABOLIC PANEL     Status: Abnormal   Collection Time   03/17/12  6:37 PM      Component Value Range Comment   Sodium 133 (*) 135 - 145 mEq/L    Potassium 3.7  3.5 - 5.1 mEq/L    Chloride 99  96 - 112 mEq/L    CO2 23  19 - 32 mEq/L    Glucose, Bld 186 (*) 70 - 99 mg/dL    BUN 19  6 - 23 mg/dL    Creatinine, Ser 7.82  0.50 - 1.35 mg/dL    Calcium 8.9  8.4 - 95.6 mg/dL    GFR calc non Af Amer >90  >90 mL/min    GFR calc Af Amer >90  >90 mL/min   PROTIME-INR     Status: Normal   Collection Time   03/17/12  6:37 PM      Component Value Range Comment   Prothrombin Time 13.6  11.6 - 15.2 seconds    INR 1.05  0.00 - 1.49   APTT     Status: Normal   Collection Time   03/17/12  6:37 PM      Component Value Range Comment   aPTT 26  24 - 37 seconds   TROPONIN I     Status: Normal   Collection Time   03/17/12  6:37 PM      Component Value Range Comment   Troponin I <0.30  <0.30 ng/mL   D-DIMER, QUANTITATIVE     Status: Normal   Collection Time   03/17/12  9:39 PM      Component Value Range Comment   D-Dimer, Quant 0.36  0.00 - 0.48 ug/mL-FEU   TROPONIN I     Status: Normal   Collection Time   03/18/12 12:24 AM  Component Value Range Comment   Troponin I <0.30  <0.30 ng/mL   GLUCOSE, CAPILLARY     Status: Abnormal   Collection Time   03/18/12  2:04 AM      Component Value Range Comment   Glucose-Capillary 149 (*) 70 - 99 mg/dL   TROPONIN I     Status: Normal   Collection Time   03/18/12  3:12 AM      Component Value Range Comment    Troponin I <0.30  <0.30 ng/mL   URINALYSIS, ROUTINE W REFLEX MICROSCOPIC     Status: Abnormal   Collection Time   03/18/12  4:02 AM      Component Value Range Comment   Color, Urine YELLOW  YELLOW    APPearance CLEAR  CLEAR    Specific Gravity, Urine >1.030 (*) 1.005 - 1.030    pH 5.5  5.0 - 8.0    Glucose, UA NEGATIVE  NEGATIVE mg/dL    Hgb urine dipstick NEGATIVE  NEGATIVE    Bilirubin Urine NEGATIVE  NEGATIVE    Ketones, ur NEGATIVE  NEGATIVE mg/dL    Protein, ur NEGATIVE  NEGATIVE mg/dL    Urobilinogen, UA 0.2  0.0 - 1.0 mg/dL    Nitrite NEGATIVE  NEGATIVE   CBC     Status: Normal   Collection Time   03/18/12  4:33 AM      Component Value Range Comment   WBC 4.4  4.0 - 10.5 K/uL    RBC 4.78  4.22 - 5.81 MIL/uL    Hemoglobin 14.8  13.0 - 17.0 g/dL    HCT 96.2  95.2 - 84.1 %    MCV 88.1  78.0 - 100.0 fL    MCH 31.0  26.0 - 34.0 pg    MCHC 35.2  30.0 - 36.0 g/dL    RDW 32.4  40.1 - 02.7 %    Platelets 167  150 - 400 K/uL   COMPREHENSIVE METABOLIC PANEL     Status: Abnormal   Collection Time   03/18/12  4:33 AM      Component Value Range Comment   Sodium 133 (*) 135 - 145 mEq/L    Potassium 3.8  3.5 - 5.1 mEq/L    Chloride 103  96 - 112 mEq/L    CO2 20  19 - 32 mEq/L    Glucose, Bld 157 (*) 70 - 99 mg/dL    BUN 16  6 - 23 mg/dL    Creatinine, Ser 2.53  0.50 - 1.35 mg/dL    Calcium 8.0 (*) 8.4 - 10.5 mg/dL    Total Protein 6.4  6.0 - 8.3 g/dL    Albumin 3.5  3.5 - 5.2 g/dL    AST 14  0 - 37 U/L    ALT 24  0 - 53 U/L    Alkaline Phosphatase 41  39 - 117 U/L    Total Bilirubin 0.4  0.3 - 1.2 mg/dL    GFR calc non Af Amer >90  >90 mL/min    GFR calc Af Amer >90  >90 mL/min   GLUCOSE, CAPILLARY     Status: Abnormal   Collection Time   03/18/12  7:30 AM      Component Value Range Comment   Glucose-Capillary 155 (*) 70 - 99 mg/dL    Dg Chest Portable 1 View  03/17/2012  *RADIOLOGY REPORT*  Clinical Data: Chest pain.  PORTABLE CHEST - 1 VIEW  Comparison: None.   Findings: Low lung  volumes. Heart and mediastinal contours are within normal limits.  No focal opacities or effusions.  No acute bony abnormality.  IMPRESSION: No active cardiopulmonary disease.   Original Report Authenticated By: Charlett Nose, M.D.    ROS See HPI Eyes: Negative Ears:Negative for hearing loss, tinnitus Cardiovascular: Negative for , palpitations,irregular heartbeat, near-syncope, orthopnea, paroxysmal nocturnal dyspnea and syncope,edema, claudication, cyanosis,.  Respiratory:   Negative for cough, hemoptysis, sputum production and wheezing.   Endocrine: Negative for cold intolerance and heat intolerance.  Hematologic/Lymphatic: Negative for adenopathy and bleeding problem. Does not bruise/bleed easily.  Musculoskeletal: Negative.   Gastrointestinal: Negative for reflux, abdominal pain, constipation.   Neurological: Negative.  Allergic/Immunologic: Negative for environmental allergies.  Blood pressure 120/79, pulse 84, temperature 97.4 F (36.3 C), temperature source Oral, resp. rate 18, height 5' 11.5" (1.816 m), weight 175 lb 14.8 oz (79.8 kg), SpO2 94.00%. Physical Exam PHYSICAL EXAM: Well-nournished, in no acute distress. Neck: No JVD, HJR, Bruit, or thyroid enlargement Lungs: No tachypnea, clear without wheezing, rales, or rhonchi; decreased breath sounds with splinting secondary to pain on inspiration Cardiovascular: RRR, PMI not displaced, heart sounds normal, no murmurs, gallops, bruit, thrill, or heave. Abdomen: BS normal. Soft without organomegaly, masses, lesions or tenderness. Extremities: without cyanosis, clubbing or edema. Good distal pulses bilateral SKin: Warm, no lesions or rashes  Musculoskeletal: No deformities Neuro: no focal signs  EKG: 03/17/12: Normal sinus rhythm; left atrial abnormality; increased R-wave voltage in V1; possible prior septal myocardial infarction; probable prior lateral infarction; left anterior fascicular block; no change when  compared to a previous tracing performed 05/23/11.  Echocardiogram: Distal anterolateral, apical and inferior hypokinesis to akinesis; EF of approximately 45%; no significant valvular abnormalities.  Assessment/Plan: 1 Chest pain: atypical with with negative enzymes, Ddimer normal. No acute EKG abnormalities during symptoms and no response to nitroglycerin. Check 2Decho. Can consider stress myoview. Will discuss with Dr. Dietrich Pates. 2 Coronary artery disease status post non-ST elevation MI treated with drug-eluting stent to the mid LAD in 03/2010 ejection fraction 55% 3 gastroenteritis 4 hyperlipidemia treated 5 tobacco abuse quit smoking 22 months ago 6 anxiety  Jacolyn Reedy 03/18/2012, 8:21 AM   Cardiology Attending Patient interviewed and examined. Discussed with Jacolyn Reedy, PA-C.  Above note annotated and modified based upon my findings.  Patient symptoms are more suggestive of a musculoskeletal etiology, pericarditis or pleuritis than myocardial ischemia. I suggested that emesis may have resulted in chest wall injury, but patient noted that he had only 2 episodes neither of which was particularly violent. At present, the major issue is pain control. Narcotics and benzodiazepines are providing relief. Ongoing treatment with a nonsteroidal will be added. As the result of the new wall motion abnormality on echocardiogram, we will proceed with a pharmacologic stress nuclear study in the morning.  Jermyn Bing, MD 03/18/2012, 3:47 PM

## 2012-03-18 NOTE — ED Notes (Signed)
Attempted to call report, was told the nurse would call me back

## 2012-03-18 NOTE — ED Provider Notes (Signed)
0050 Assumed care/disposition of patient. Patient with known CAD, NSTEMI, s/p stents here with chest pain that began last night. He has required multiple doses of analgesia and still has chest pain at a 7/10. Troponin x 2 negative. EKG is unremarkable. Plan is for 3rd troponin. No beds for cardiology at [facility]. Will consider admission here at [facility].  0130 Patient is resting. Still with pain 7/10. Ordered additional dilaudid and ativan. Awake,alert, VSS.Cor: RRR, no murmur, click or rub noted Chest: clear to auscultation all fields, Abd: soft, non tender, normal bowel sounds, Ext: no edema 0227 T/C to Dr. Orvan Falconer, hospitalist case discussed, including:  HPI, pertinent PM/SHx, VS/PE, dx testing, ED course and treatment.  Agreeable to admission for observation. .  Requests to write temporary orders,telemetry bed and to place nitro paste.  Results for orders placed during the hospital encounter of 03/17/12  CBC WITH DIFFERENTIAL      Component Value Range   WBC 4.8  4.0 - 10.5 K/uL   RBC 5.20  4.22 - 5.81 MIL/uL   Hemoglobin 16.2  13.0 - 17.0 g/dL   HCT 95.2  84.1 - 32.4 %   MCV 87.5  78.0 - 100.0 fL   MCH 31.2  26.0 - 34.0 pg   MCHC 35.6  30.0 - 36.0 g/dL   RDW 40.1  02.7 - 25.3 %   Platelets 175  150 - 400 K/uL   Neutrophils Relative 76  43 - 77 %   Neutro Abs 3.7  1.7 - 7.7 K/uL   Lymphocytes Relative 17  12 - 46 %   Lymphs Abs 0.8  0.7 - 4.0 K/uL   Monocytes Relative 7  3 - 12 %   Monocytes Absolute 0.3  0.1 - 1.0 K/uL   Eosinophils Relative 0  0 - 5 %   Eosinophils Absolute 0.0  0.0 - 0.7 K/uL   Basophils Relative 0  0 - 1 %   Basophils Absolute 0.0  0.0 - 0.1 K/uL  BASIC METABOLIC PANEL      Component Value Range   Sodium 133 (*) 135 - 145 mEq/L   Potassium 3.7  3.5 - 5.1 mEq/L   Chloride 99  96 - 112 mEq/L   CO2 23  19 - 32 mEq/L   Glucose, Bld 186 (*) 70 - 99 mg/dL   BUN 19  6 - 23 mg/dL   Creatinine, Ser 6.64  0.50 - 1.35 mg/dL   Calcium 8.9  8.4 - 40.3 mg/dL   GFR calc non Af  Amer >90  >90 mL/min   GFR calc Af Amer >90  >90 mL/min  PROTIME-INR      Component Value Range   Prothrombin Time 13.6  11.6 - 15.2 seconds   INR 1.05  0.00 - 1.49  APTT      Component Value Range   aPTT 26  24 - 37 seconds  TROPONIN I      Component Value Range   Troponin I <0.30  <0.30 ng/mL  D-DIMER, QUANTITATIVE      Component Value Range   D-Dimer, Quant 0.36  0.00 - 0.48 ug/mL-FEU  TROPONIN I      Component Value Range   Troponin I <0.30  <0.30 ng/mL  GLUCOSE, CAPILLARY      Component Value Range   Glucose-Capillary 149 (*) 70 - 99 mg/dL   Dg Chest Portable 1 View  03/17/2012  *RADIOLOGY REPORT*  Clinical Data: Chest pain.  PORTABLE CHEST - 1 VIEW  Comparison:  None.  Findings: Low lung volumes. Heart and mediastinal contours are within normal limits.  No focal opacities or effusions.  No acute bony abnormality.  IMPRESSION: No active cardiopulmonary disease.   Original Report Authenticated By: Charlett Nose, M.D.     Pt stable in ED with no significant deterioration in condition.The patient appears reasonably stabilized for admission considering the current resources, flow, and capabilities available in the ED at this time, and I doubt any other [facility] requiring further screening and/or treatment in the ED prior to admission.  Nicoletta Dress. Colon Branch, MD 03/18/12 [phone]

## 2012-03-18 NOTE — Progress Notes (Addendum)
Patient admitted after midnight. Discussed with Ms. Geni Bers. Chart reviewed. Patient examined. Still with 7/10 chest pain. Feels like a knife stabbing him in the heart. Worse with movement. Worried about shortness of breath. No further vomiting or diarrhea.  Toradol, GI cocktail, nitroglycerin did not help. The only thing that helped was Dilaudid and Ativan. Await echocardiogram. Suspect musculoskeletal versus pericarditis. However, would expect improvement with anti-inflammatories. D-dimer and cardiac enzymes negative.

## 2012-03-19 ENCOUNTER — Observation Stay (HOSPITAL_COMMUNITY): Payer: 59

## 2012-03-19 ENCOUNTER — Encounter (HOSPITAL_COMMUNITY): Payer: Self-pay | Admitting: Cardiology

## 2012-03-19 ENCOUNTER — Encounter (HOSPITAL_COMMUNITY): Payer: Self-pay

## 2012-03-19 DIAGNOSIS — I214 Non-ST elevation (NSTEMI) myocardial infarction: Secondary | ICD-10-CM

## 2012-03-19 DIAGNOSIS — E785 Hyperlipidemia, unspecified: Secondary | ICD-10-CM

## 2012-03-19 DIAGNOSIS — E119 Type 2 diabetes mellitus without complications: Secondary | ICD-10-CM

## 2012-03-19 DIAGNOSIS — R072 Precordial pain: Principal | ICD-10-CM

## 2012-03-19 LAB — GLUCOSE, CAPILLARY
Glucose-Capillary: 163 mg/dL — ABNORMAL HIGH (ref 70–99)
Glucose-Capillary: 172 mg/dL — ABNORMAL HIGH (ref 70–99)

## 2012-03-19 MED ORDER — TECHNETIUM TC 99M SESTAMIBI - CARDIOLITE
10.0000 | Freq: Once | INTRAVENOUS | Status: AC | PRN
  Administered 2012-03-19: 9.5 via INTRAVENOUS

## 2012-03-19 MED ORDER — TECHNETIUM TC 99M SESTAMIBI - CARDIOLITE
30.0000 | Freq: Once | INTRAVENOUS | Status: AC | PRN
  Administered 2012-03-19: 31 via INTRAVENOUS

## 2012-03-19 MED ORDER — REGADENOSON 0.4 MG/5ML IV SOLN
INTRAVENOUS | Status: AC
  Filled 2012-03-19: qty 5

## 2012-03-19 MED ORDER — SODIUM CHLORIDE 0.9 % IJ SOLN
INTRAMUSCULAR | Status: AC
  Administered 2012-03-19: 10 mL via INTRAVENOUS
  Filled 2012-03-19: qty 10

## 2012-03-19 MED ORDER — REGADENOSON 0.4 MG/5ML IV SOLN
INTRAVENOUS | Status: AC
  Administered 2012-03-19: 0.4 mg via INTRAVENOUS
  Filled 2012-03-19: qty 5

## 2012-03-19 NOTE — Progress Notes (Signed)
Stress Lab Nurses Notes - [name]  Derek Chen 03/19/2012 Reason for doing test: CAD and Chest Pain Type of test: [name] / Inpatient Rm 301 Nurse performing test: Parke Poisson, RN Nuclear Medicine Tech: Lou Cal Echo Tech: Not Applicable MD performing test: Ival Bible 7 [name] NP Family MD:  Test explained and consent signed: yes IV started: 22g jelco, Saline lock flushed, No redness or edema and Saline lock started in radiology Symptoms: chest pressure Treatment/Intervention: None Reason test stopped: protocol completed After recovery IV was: No redness or edema Patient to return to Nuc. Med at : 14:00 Patient discharged: Transported back to room 301 via wc Patient's Condition upon discharge was: stable Comments: During test BP 140/98 & HR 117.  Recovery BP 143/92 & HR 96.  Symptoms resolved in recovery. [name] T

## 2012-03-19 NOTE — Progress Notes (Signed)
Discussed with Dr. Diona Browner.  Subjective: Pain is 7/10. Overall slightly improved. Still short of breath. No vomiting or diarrhea.  Objective: Vital signs in last 24 hours: Filed Vitals:   03/19/12 0219 03/19/12 0403 03/19/12 1017 03/19/12 1500  BP: 110/67 112/66 138/83 103/75  Pulse: 59 76 61 77  Temp: 97.3 F (36.3 C) 97.5 F (36.4 C) 97.3 F (36.3 C) 96.8 F (36 C)  TempSrc: Oral Oral Oral Oral  Resp: 18 18 18 18   Height:      Weight:  81.6 kg (179 lb 14.3 oz)    SpO2: 99% 98% 98% 99%   Weight change: 1.767 kg (3 lb 14.3 oz)  Intake/Output Summary (Last 24 hours) at 03/19/12 1651 Last data filed at 03/19/12 1300  Gross per 24 hour  Intake   1300 ml  Output   1950 ml  Net   -650 ml   General: Lying in a darkened room. Lungs clear to auscultation bilaterally without wheeze rhonchi or rales Cardiovascular regular rate rhythm without murmurs gallops rubs. less chest wall tenderness today Abdomen soft nontender nondistended Extremities no clubbing cyanosis or edema Psychiatric: Flat affect. Appears depressed  Lab Results: Basic Metabolic Panel:  Lab 03/18/12 1610 03/17/12 1837  NA 133* 133*  K 3.8 3.7  CL 103 99  CO2 20 23  GLUCOSE 157* 186*  BUN 16 19  CREATININE 0.88 0.80  CALCIUM 8.0* 8.9  MG -- --  PHOS -- --   Liver Function Tests:  Lab 03/18/12 0433  AST 14  ALT 24  ALKPHOS 41  BILITOT 0.4  PROT 6.4  ALBUMIN 3.5   No results found for this basename: LIPASE:2,AMYLASE:2 in the last 168 hours No results found for this basename: AMMONIA:2 in the last 168 hours CBC:  Lab 03/18/12 0433 03/17/12 1837  WBC 4.4 4.8  NEUTROABS -- 3.7  HGB 14.8 16.2  HCT 42.1 45.5  MCV 88.1 87.5  PLT 167 175   Cardiac Enzymes:  Lab 03/18/12 0806 03/18/12 0312 03/18/12 0024  CKTOTAL -- -- --  CKMB -- -- --  CKMBINDEX -- -- --  TROPONINI <0.30 <0.30 <0.30   BNP: No results found for this basename: PROBNP:3 in the last 168 hours D-Dimer:  Lab 03/17/12 2139   DDIMER 0.36   CBG:  Lab 03/19/12 1147 03/19/12 0741 03/18/12 2014 03/18/12 1648 03/18/12 1136 03/18/12 0730  GLUCAP 163* 147* 111* 109* 128* 155*   Hemoglobin A1C: No results found for this basename: HGBA1C in the last 168 hours Fasting Lipid Panel: No results found for this basename: CHOL,HDL,LDLCALC,TRIG,CHOLHDL,LDLDIRECT in the last 960 hours Thyroid Function Tests:  Lab 03/18/12 0402  TSH 1.264  T4TOTAL --  FREET4 --  T3FREE --  THYROIDAB --   Coagulation:  Lab 03/17/12 1837  LABPROT 13.6  INR 1.05   Urinalysis:  Lab 03/18/12 0402  COLORURINE YELLOW  LABSPEC >1.030*  PHURINE 5.5  GLUCOSEU NEGATIVE  HGBUR NEGATIVE  BILIRUBINUR NEGATIVE  KETONESUR NEGATIVE  PROTEINUR NEGATIVE  UROBILINOGEN 0.2  NITRITE NEGATIVE  LEUKOCYTESUR NEGATIVE   Micro Results: No results found for this or any previous visit (from the past 240 hour(s)). Studies/Results: Nm Myocar Single W/spect W/wall Motion And Ef  03/19/2012  Ordering Physician: [place] Bing  Reading Physician: Jonelle Sidle  Clinical Data: 51 year old male with prior history of NSTEMI managed with DES to the LAD in December 2011, hypertension, hyperlipidemia.  He presents with prolonged chest pain, normal cardiac markers, and echocardiography demonstrates mild left ventricular dysfunction  with anterolateral and apical wall motion abnormalities.  The study is requested to evaluate for the presence of ischemia.  NUCLEAR MEDICINE STRESS MYOVIEW STUDY WITH SPECT AND LEFT VENTRICULAR EJECTION FRACTION  Radionuclide Data: One-day rest/stress protocol performed with 10/30 mCi of Tc-20m Myoview.  Stress Data: Lexiscan bolus was given in standard fashion.  Heart rate increased from 82 beats per minute up to 118 beats per minute, blood pressure increased from 133/100 up to 143/92.  The patient tolerated infusion well.  There were no diagnostic ST-segment abnormalities, no arrhythmias.  EKG: Baseline tracing shows sinus rhythm  at 84 with leftward axis, early transition in the precordial leads, nonspecific ST-T changes.  Scintigraphic Data: Analysis of the raw perfusion data finds gut uptake adjacent to the inferior myocardium.  Tomographic views were obtained using the short axis, vertical long axis, and horizontal long axis planes.  There is a dense defect from mid to apical anterolateral wall that exhibits partial reversibility mainly at the apex.  This is consistent with scar and associated periinfarct ischemia.  Gated imaging reveals an EDV of 117, ESV of 56, T I D ratio of 0.96, and LVEF of 53% with anteroapical hypokinesis.  IMPRESSION: Overall low to moderate risk, abnormal Lexiscan Myoview as outlined.  No diagnostic ST-segment changes were noted.  No chest pain was reported.  Perfusion imaging shows evidence of scar with periinfarct ischemia, overall moderate sized defect affecting the mid to apical anterolateral wall.  LVEF 53% with anteroapical hypokinesis.   Original Report Authenticated By: Nona Dell    Dg Chest Portable 1 View  03/17/2012  *RADIOLOGY REPORT*  Clinical Data: Chest pain.  PORTABLE CHEST - 1 VIEW  Comparison: None.  Findings: Low lung volumes. Heart and mediastinal contours are within normal limits.  No focal opacities or effusions.  No acute bony abnormality.  IMPRESSION: No active cardiopulmonary disease.   Original Report Authenticated By: Charlett Nose, M.D.    Scheduled Meds:   . aspirin EC  81 mg Oral Daily  . carvedilol  6.25 mg Oral BID WC  . clopidogrel  75 mg Oral Daily  . enoxaparin (LOVENOX) injection  40 mg Subcutaneous Q24H  . insulin aspart  0-5 Units Subcutaneous QHS  . insulin aspart  0-9 Units Subcutaneous TID WC  . isosorbide mononitrate  60 mg Oral Daily  . metFORMIN  1,000 mg Oral BID  . naproxen  375 mg Oral TID  . pantoprazole sodium  40 mg Per Tube Q1200  . rosuvastatin  5 mg Oral QHS  . sodium chloride  3 mL Intravenous Q12H   Continuous Infusions:  PRN  Meds:.acetaminophen, clonazePAM, HYDROmorphone (DILAUDID) injection, LORazepam, LORazepam, ondansetron (ZOFRAN) IV Assessment/Plan: Active Problems:  HYPERLIPIDEMIA  Type II or unspecified type diabetes mellitus without mention of complication, not stated as uncontrolled  Coronary artery disease  Anxiety  GE (gastroenteritis)  Precordial pain  Pain is atypical, however her echocardiogram and Myoview slightly worse than previous studies. Patient will undergo cardiac catheterization at [facility] tomorrow. We'll make him n.p.o. After midnight. Stop metformin.   LOS: 2 days   Marianne Golightly L 03/19/2012, 4:51 PM

## 2012-03-19 NOTE — Progress Notes (Signed)
Primary cardiologist: Dr. Lewayne Bunting  Subjective:   Patient complains of "8/10" chest pain this AM - looks comfortable however, watching TV. States pain has not subsided since it began prior to presentation. No nausea or vomiting.   Objective:   Temp:  [97.3 F (36.3 C)-98.2 F (36.8 C)] 97.5 F (36.4 C) (12/19 0403) Pulse Rate:  [59-86] 76  (12/19 0403) Resp:  [18] 18  (12/19 0403) BP: (103-137)/(58-91) 112/66 mmHg (12/19 0403) SpO2:  [95 %-100 %] 98 % (12/19 0403) Weight:  [179 lb 14.3 oz (81.6 kg)] 179 lb 14.3 oz (81.6 kg) (12/19 0403) Last BM Date: 03/17/12  Filed Weights   03/17/12 1816 03/18/12 0405 03/19/12 0403  Weight: 176 lb (79.833 kg) 175 lb 14.8 oz (79.8 kg) 179 lb 14.3 oz (81.6 kg)    Intake/Output Summary (Last 24 hours) at 03/19/12 0727 Last data filed at 03/19/12 0600  Gross per 24 hour  Intake   2245 ml  Output   2800 ml  Net   -555 ml   Telemetry: Sinus rhythm with lead artifact.  Exam:  General: NAD.  Lungs: Clear, nonlabored.  Cardiac:RRR, no rub or gallop.  Extremities: No pitting.  Lab Results:  Basic Metabolic Panel:  Lab 03/18/12 9604 03/17/12 1837  NA 133* 133*  K 3.8 3.7  CL 103 99  CO2 20 23  GLUCOSE 157* 186*  BUN 16 19  CREATININE 0.88 0.80  CALCIUM 8.0* 8.9  MG -- --    Liver Function Tests:  Lab 03/18/12 0433  AST 14  ALT 24  ALKPHOS 41  BILITOT 0.4  PROT 6.4  ALBUMIN 3.5    CBC:  Lab 03/18/12 0433 03/17/12 1837  WBC 4.4 4.8  HGB 14.8 16.2  HCT 42.1 45.5  MCV 88.1 87.5  PLT 167 175    Cardiac Enzymes:  Lab 03/18/12 0806 03/18/12 0312 03/18/12 0024  CKTOTAL -- -- --  CKMB -- -- --  CKMBINDEX -- -- --  TROPONINI <0.30 <0.30 <0.30    ECG 12/18: Sinus rhythm with counterclockwise rotation and LAFB.  Echocardiogram 12/18: Study Conclusions  - Left ventricle: The cavity size was normal. There was mild concentric hypertrophy. Systolic function was mildly reduced. The estimated ejection  fraction was in the range of 45% to 50%. Severe hypokinesis of the mid-distal inferolateral myocardium extending inferoapically. Severe hypokinesis to akinesis of the distal anterolateral myocardium. - Aortic valve: Mildly calcified annulus. - Mitral valve: Calcified annulus. Impressions:  - Compared to the previous study performed 03/29/2010, inferolateral wall motion abnormality is more prominent on the present examination.   Medications:   Scheduled Medications:    . aspirin EC  81 mg Oral Daily  . carvedilol  6.25 mg Oral BID WC  . clopidogrel  75 mg Oral Daily  . enoxaparin (LOVENOX) injection  40 mg Subcutaneous Q24H  . insulin aspart  0-5 Units Subcutaneous QHS  . insulin aspart  0-9 Units Subcutaneous TID WC  . isosorbide mononitrate  60 mg Oral Daily  . metFORMIN  1,000 mg Oral BID  . naproxen  375 mg Oral TID  . pantoprazole sodium  40 mg Per Tube Q1200  . rosuvastatin  5 mg Oral QHS  . sodium chloride  3 mL Intravenous Q12H    Infusions:    PRN Medications: acetaminophen, clonazePAM, HYDROmorphone (DILAUDID) injection, LORazepam, LORazepam, ondansetron (ZOFRAN) IV   Assessment:   1. Chest pain syndrome. Consult note by Dr. Dietrich Pates reviewed. Patient reports continued chest pain, cardiac  markers are normal, and ECG without acute ST change - question prior IP infarct pattern. Echocardiogram shows LVEF 45-50% with inferolateral WMA (more prominent reportedly).  2. Known CAD s/p NSTEMI December 2011 with DES to mid LAD and otherwise mild to moderate residual disease.Previous LVEF 55%.  3. Type 2 diabetes mellitus.  4. Hyperlipidemia, on Crestor.  5. Recent symptoms of nausea and emesis prior to chest pain, now resolved. Afebrile and LFTs normal.  Plan/Discussion:    Plan per Dr. Dietrich Pates is Myoview today - already ordered. Will followup on results and then decide if further cardiac evaluation to be considered. Continue present regimen - hold AM glucophage  and insulin since NPO.   Jonelle Sidle, M.D., F.A.C.C.

## 2012-03-20 ENCOUNTER — Encounter (HOSPITAL_COMMUNITY)
Admission: EM | Disposition: A | Discharge status: Home / Self Care | Payer: Self-pay | Source: Home / Self Care | Attending: Internal Medicine

## 2012-03-20 ENCOUNTER — Ambulatory Visit (HOSPITAL_COMMUNITY): Admit: 2012-03-20 | Payer: Self-pay | Admitting: Cardiovascular Disease

## 2012-03-20 DIAGNOSIS — R079 Chest pain, unspecified: Secondary | ICD-10-CM

## 2012-03-20 HISTORY — PX: LEFT HEART CATHETERIZATION WITH CORONARY ANGIOGRAM: SHX5451

## 2012-03-20 LAB — GLUCOSE, CAPILLARY
Glucose-Capillary: 139 mg/dL — ABNORMAL HIGH (ref 70–99)
Glucose-Capillary: 137 mg/dL — ABNORMAL HIGH (ref 70–99)

## 2012-03-20 SURGERY — LEFT HEART CATHETERIZATION WITH CORONARY ANGIOGRAM
Anesthesia: LOCAL

## 2012-03-20 MED ORDER — FENTANYL CITRATE 0.05 MG/ML IJ SOLN
INTRAMUSCULAR | Status: AC
  Filled 2012-03-20: qty 2

## 2012-03-20 MED ORDER — ONDANSETRON HCL 4 MG/2ML IJ SOLN: 4.0000 mg | Freq: Four times a day (QID) | INTRAMUSCULAR | Status: DC | PRN

## 2012-03-20 MED ORDER — LIDOCAINE HCL (PF) 1 % IJ SOLN
INTRAMUSCULAR | Status: AC
  Filled 2012-03-20: qty 30

## 2012-03-20 MED ORDER — HEPARIN (PORCINE) IN NACL 2-0.9 UNIT/ML-% IJ SOLN
INTRAMUSCULAR | Status: AC
  Filled 2012-03-20: qty 1000

## 2012-03-20 MED ORDER — SODIUM CHLORIDE 0.9 % IJ SOLN: 3.0000 mL | Freq: Two times a day (BID) | INTRAMUSCULAR | Status: DC

## 2012-03-20 MED ORDER — ASPIRIN 81 MG PO CHEW
324.0000 mg | CHEWABLE_TABLET | ORAL | Status: AC
  Administered 2012-03-20: 324 mg via ORAL
  Filled 2012-03-20: qty 4

## 2012-03-20 MED ORDER — SODIUM CHLORIDE 0.9 % IV SOLN: 250.0000 mL | INTRAVENOUS | Status: DC | PRN

## 2012-03-20 MED ORDER — NITROGLYCERIN 0.4 MG/SPRAY TL SOLN
1.0000 | Status: DC | PRN
  Administered 2012-03-20 (×2): 1 via SUBLINGUAL
  Filled 2012-03-20: qty 4.9

## 2012-03-20 MED ORDER — MORPHINE SULFATE 2 MG/ML IJ SOLN
INTRAMUSCULAR | Status: AC
  Filled 2012-03-20: qty 2

## 2012-03-20 MED ORDER — ACETAMINOPHEN 325 MG PO TABS: 650.0000 mg | ORAL_TABLET | ORAL | Status: DC | PRN

## 2012-03-20 MED ORDER — MORPHINE SULFATE 10 MG/ML IJ SOLN
4.0000 mg | Freq: Once | INTRAMUSCULAR | Status: AC
  Administered 2012-03-20: 4 mg via INTRAVENOUS

## 2012-03-20 MED ORDER — SODIUM CHLORIDE 0.9 % IV SOLN
1.0000 mL/kg/h | INTRAVENOUS | Status: DC
  Administered 2012-03-20: 1 mL/kg/h via INTRAVENOUS

## 2012-03-20 MED ORDER — SODIUM CHLORIDE 0.9 % IJ SOLN
INTRAMUSCULAR | Status: AC
  Administered 2012-03-20: 3 mL
  Filled 2012-03-20: qty 3

## 2012-03-20 MED ORDER — SODIUM CHLORIDE 0.9 % IJ SOLN: 3.0000 mL | INTRAMUSCULAR | Status: DC | PRN

## 2012-03-20 MED ORDER — NITROGLYCERIN 0.2 MG/ML ON CALL CATH LAB
INTRAVENOUS | Status: AC
  Filled 2012-03-20: qty 1

## 2012-03-20 MED ORDER — MIDAZOLAM HCL 2 MG/2ML IJ SOLN
INTRAMUSCULAR | Status: AC
  Filled 2012-03-20: qty 2

## 2012-03-20 MED ORDER — ASPIRIN EC 81 MG PO TBEC
81.0000 mg | DELAYED_RELEASE_TABLET | Freq: Every day | ORAL | Status: DC
  Administered 2012-03-21: 10:00:00 81 mg via ORAL
  Filled 2012-03-20: qty 1

## 2012-03-20 MED ORDER — VERAPAMIL HCL 2.5 MG/ML IV SOLN
INTRAVENOUS | Status: AC
  Filled 2012-03-20: qty 2

## 2012-03-20 MED ORDER — SODIUM CHLORIDE 0.9 % IV SOLN
INTRAVENOUS | Status: AC
  Administered 2012-03-20: 14:00:00 via INTRAVENOUS

## 2012-03-20 NOTE — Progress Notes (Signed)
[facility]  Patient Status: New  [facility] is providing the following services: RN - new referral from [name]  If patient discharges after hours, please call [phone].   Derek Chen 03/20/2012, 4:51 PM

## 2012-03-20 NOTE — Progress Notes (Addendum)
SUBJECTIVE: Continues some chest discomfort, but not severe or limiting.  He is complaining more now about dyspnea and chest discomfort, which continues to be positional and pleuritic.  LABS: Basic Metabolic Panel:  Basename 03/18/12 0433 03/17/12 1837  NA 133* 133*  K 3.8 3.7  CL 103 99  CO2 20 23  GLUCOSE 157* 186*  BUN 16 19  CREATININE 0.88 0.80  CALCIUM 8.0* 8.9  MG -- --  PHOS -- --   Liver Function Tests:  [facility] 03/18/12 0433  AST 14  ALT 24  ALKPHOS 41  BILITOT 0.4  PROT 6.4  ALBUMIN 3.5   CBC:  Basename 03/18/12 0433 03/17/12 1837  WBC 4.4 4.8  NEUTROABS -- 3.7  HGB 14.8 16.2  HCT 42.1 45.5  MCV 88.1 87.5  PLT 167 175   Cardiac Enzymes:  Basename 03/18/12 0806 03/18/12 0312 03/18/12 0024  CKTOTAL -- -- --  CKMB -- -- --  Coralyn Mark -- -- --  TROPONINI <0.30 <0.30 <0.30   D-Dimer:  [facility] 03/17/12 2139  DDIMER 0.36   Thyroid Function Tests:  Basename 03/18/12 0402  TSH 1.264  T4TOTAL --  T3FREE --  Marko Plume --    RADIOLOGY: Nm Myocar Single W/spect W/wall Motion And Ef  03/19/2012   IMPRESSION: Overall low to moderate risk, abnormal Lexiscan Myoview.  No diagnostic ST-segment changes were noted.  No chest pain was reported.  Perfusion imaging shows evidence of scar with periinfarct ischemia, overall moderate sized defect affecting the mid to apical anterolateral wall.  LVEF 53% with anteroapical hypokinesis.   Original Report Authenticated By: Nona Dell    PHYSICAL EXAM BP 112/68  Pulse 77  Temp 97.4 F (36.3 C) (Oral)  Resp 18  Ht 5' 11.5" (1.816 m)  Wt 180 lb 5.4 oz (81.8 kg)  BMI 24.80 kg/m2  SpO2 100% General: Well developed, well nourished, in no acute distress Head: Eyes PERRLA, No xanthomas.   Normal cephalic and atramatic  Lungs: Clear bilaterally to auscultation and percussion. Heart: HRRR S1 S2, S4 present. No M or R .  Pulses are 2+ & equal.            No carotid bruit. No JVD.  No abdominal bruits. No  femoral bruits. Abdomen: Bowel sounds are positive, abdomen soft and non-tender without masses or                  Hernia's noted. Msk:  Back normal, normal gait. Normal strength and tone for age. Extremities: No clubbing, cyanosis or edema.  DP +1 Neuro: Alert and oriented X 3. Psych:  Good affect, responds appropriately  TELEMETRY: Reviewed telemetry pt in NSR  ASSESSMENT AND PLAN:  1. Chest pain.  Patient reports continued chest pain, cardiac markers are normal, and ECG without acute ST change - question prior IP infarct pattern. Echocardiogram shows LVEF 45-50% with inferolateral WMA (more prominent reportedly). Lexiscan/Cardiolilte demonstrated overall moderate sized defect affecting the mid to apical anterolateral wall.  LVEF 53% with anteroapical hypokinesis. He continues to have chest discomfort. Plan to transfer to [facility] this am for diagnostic cardiac catheterization.Metformin and Lovenox have been held this am. Patient is willing to transfer to [facility], risk and benefits have been discussed. He has had two cath's before and verbalizes understanding.   2. Known CAD s/p NSTEMI December 2011 with DES to mid LAD and otherwise mild to moderate residual disease.Previous LVEF 55%.  3. Hyperlipidemia 4. Type II Diabetes 5. Anxiety 6. Insomnia 7.Tobacco Abuse 8. Hepatitis B  Antibody Positive 9. Basal Cell Cancer of Chest Wall and Back 10.Recurrent Nausea and Vomiting 11.Chronic Dyspnea 12.Gasteroenteritis  Bettey Mare. Lyman Bishop NP [facility] 03/20/2012, 7:44 AM  Cardiology Attending Patient interviewed and examined. Discussed with [name], NP.  Above note annotated and modified based upon my findings.  Chest pain remains atypical.  Imaging findings and EKGs may reflect an LAD infarction that extends to the distal inferior wall.  Ischemia in the LAD distribution could reflect abnormality in the peri-infarction region, restenosis of the LAD or a new LAD stenosis. Coronary  angiography will be helpful in sorting out these possibilities and optimally treating Mr. Borchardt. He plans to return to see Dr. Andee Lineman for ongoing cardiology care.  Dyspnea is now his most prominent symptom. This may simply be a reflection of chest discomfort and inability to fully expand the chest. Chest x-ray was normal on admission. D-dimer was normal. There was no pericardial effusion and there are no physical findings to suggest tamponade.  Declo Bing, MD 03/20/2012, 10:16 AM

## 2012-03-20 NOTE — Progress Notes (Signed)
S: called by RN in pre-cath holding re: patient c/o chest pain- 7/10, tense, burning with associated shortness of breath. Worse on palpation and deep inspiration.   O:   General: middle-aged male in NAD HEENT: normocephalic, atraumatic Lungs: CTAB, no wheezing, rales or rhonchi; no increased respiratory effort Heart: RRR, clear S1, S2, no murmurs, heaves, rubs, gallops or thrills Abdomen: soft, nontender, nondistended, normoactive bowel sounds Musculoskeletal: chest pain on palpation, deep inspiration Extemities: normal strength and ROM of all 4 extremities on gross inspection, DT/PT pulses 2+ and equal bilaterally Neuro: a&o x 3, no appreciable neurological deficits Psych: normal affect, responds to questions appropriately Skin: pink, dry, warm; cap refill < 2 sec  HR: 72 BP: 121/91  EKG: NSR, 72 bpm, TWIs I, aVL, no ST changes  A/P:   [name] hospital today for atypical chest pain. He has a history of CAD s/p DES-mid LAD, otherwise mild-mod residual disease in 2011. EKG at [facility] w/o acute ST changes- question prior inferior infarct pattern. TnI WNL x 3. D-dimer WNL. TTE- LVEF 45-50%, inferolateral WMAs (more prominent than prior studies). Lexiscan revealed LVEF 53%, moderate sized defect affecting mid-apical anterolateral wall, anteroapical hypokinesis. Given these findings, plan to pursue diagnostic cath to evaluate prior PCI and LAD territory (i.e. Peri-infarct ischemia vs restenosis of LAD DES or new LAD stenosis). Currently awaiting cath in pre-cath holding.   1. Atypical chest pain- pain is c/w prior MI and for which he was evaluated at [facility]. Quite atypical, worse on palpation and deep inspiration. EKG done w/o acute ST changes. Not responsive to NTG. Reports Dilaudid given at [facility] did relieve. Will give morphine 4mg  IV x 1. Pain may be more musculoskeletal, however cath will certainly provide a clearer coronary picture and  plan going forward. Currently stable, in NAD. Will continue to monitor pre-cath.

## 2012-03-20 NOTE — Progress Notes (Signed)
03/20/12 1137 Late entry: patient left floor in stable condition, via [facility] transport for cardiac cath at [facility], about 1030 this morning. Earnstine Regal, RN

## 2012-03-20 NOTE — Progress Notes (Signed)
03/20/12 1011 Patient going to [facility] for cardiac cath today, report given to [facility], with [facility] this morning about 0950. Pt in stable condition awaiting transfer. Earnstine Regal, RN

## 2012-03-20 NOTE — CV Procedure (Signed)
    Cardiac Cath Note  Derek Chen [id number] [birth year]  Procedure: left  Heart Cardiac Catheterization Note Indications: continuous chest pain for 2-3 days, hx of LAD stenting in the past  Procedure Details Consent: Obtained Time Out: Verified patient identification, verified procedure, site/side was marked, verified correct patient position, special equipment/implants available, Radiology Safety Procedures followed,  medications/allergies/relevent history reviewed, required imaging and test results available.  Performed   Medications: Fentanyl: 50 mcg IV Versed: 2 mg IV Heparin 4500 units Verapamil 3 mg  The right radial artery was easily canulated using a modified Seldinger technique.  Hemodynamics:    LV pressure: 127/7 Aortic pressure: 125/9  Angiography   Left Main: minor luminal irregularities  Left anterior Descending: diffuse mild disease.  There is a long stent in the proximal LAD.  There is mild irregularities within the stent.   No significant changes from previous tracings.  Left Circumflex: moderate sized branch, 1st OM moderate irregularities.   Right Coronary Artery: large and dominant, minor luminal irregularities.  PDA and PLSA are unremarkable.    LV Gram: LAO and RAO views.   mildly reduced LV function - EF 40-45%.  There is akinesis of a small-medium sized area of the  anterior lateral wall.  This segmental wall motion abnormality was present at the time of his previous cath.  Complications: No apparent complications Patient did tolerate procedure well.  Contrast used: 125  Conclusions:  Moderate CAD.  The LAD stent is patent.  His coronary arteries are unchanged from his previous cath in 2011.  Derek Chen, Derek Chen., MD, [facility] 03/20/2012, 2:19 PM Office - [phone] Pager [phone]

## 2012-03-21 ENCOUNTER — Encounter (HOSPITAL_COMMUNITY): Payer: Self-pay | Admitting: Radiology

## 2012-03-21 ENCOUNTER — Inpatient Hospital Stay (HOSPITAL_COMMUNITY): Payer: 59

## 2012-03-21 DIAGNOSIS — E871 Hypo-osmolality and hyponatremia: Secondary | ICD-10-CM

## 2012-03-21 LAB — BASIC METABOLIC PANEL
Calcium: 9 mg/dL (ref 8.4–10.5)
Creatinine, Ser: 0.71 mg/dL (ref 0.50–1.35)
GFR calc non Af Amer: >90 mL/min (ref >90)
Glucose, Bld: 215 mg/dL — ABNORMAL HIGH (ref 70–99)
Sodium: 136 mEq/L (ref 135–145)

## 2012-03-21 LAB — GLUCOSE, CAPILLARY: Glucose-Capillary: 166 mg/dL — ABNORMAL HIGH (ref 70–99)

## 2012-03-21 MED ORDER — METFORMIN HCL 1000 MG PO TABS: 1000.0000 mg | ORAL_TABLET | Freq: Two times a day (BID) | ORAL | Status: DC

## 2012-03-21 MED ORDER — IOHEXOL 350 MG/ML SOLN
100.0000 mL | Freq: Once | INTRAVENOUS | Status: AC | PRN
  Administered 2012-03-21: 100 mL via INTRAVENOUS

## 2012-03-21 MED ORDER — PANTOPRAZOLE SODIUM 40 MG PO TBEC: 40.0000 mg | DELAYED_RELEASE_TABLET | Freq: Every day | ORAL | Status: DC

## 2012-03-21 MED ORDER — NITROGLYCERIN 0.4 MG SL SUBL: 0.4000 mg | SUBLINGUAL_TABLET | SUBLINGUAL | Status: DC | PRN

## 2012-03-21 MED ORDER — UNABLE TO FIND: Status: DC

## 2012-03-21 MED ORDER — NAPROXEN 375 MG PO TABS: 375.0000 mg | ORAL_TABLET | Freq: Three times a day (TID) | ORAL | Status: DC

## 2012-03-21 NOTE — Discharge Summary (Signed)
Discharge Summary   Patient ID: Derek Chen,  MRN: [id number], DOB/AGE: [birth year] 51 y.o.  Admit date: 03/17/2012 Discharge date: 03/21/2012  Primary Physician: [facility] Primary Cardiologist: Previously Dr. Earnestine Leys with [place]   Discharge Diagnoses Principal Problem:  *Precordial pain Active Problems:  HYPERLIPIDEMIA  Type II or unspecified type diabetes mellitus without mention of complication, not stated as uncontrolled  Coronary artery disease  Anxiety  GE (gastroenteritis)  Hyposmolality and/or hyponatremia   Allergies Allergies  Allergen Reactions  . Cimetidine Other (See Comments)    Severe headache    Diagnostic Studies/Procedures  PORTABLE CHEST X-RAY - 03/17/12  PORTABLE CHEST - 1 VIEW  Comparison: None.  Findings: Low lung volumes. Heart and mediastinal contours are  within normal limits. No focal opacities or effusions. No acute  bony abnormality.  IMPRESSION:  No active cardiopulmonary disease.   TRANSTHORACIC ECHOCARDIOGRAM - 03/18/12  Study Conclusions  - Left ventricle: The cavity size was normal. There was mild concentric hypertrophy. Systolic function was mildly reduced. The estimated ejection fraction was in the range of 45% to 50%. Severe hypokinesis of the mid-distal inferolateral myocardium extending inferoapically. Severe hypokinesis to akinesis of the distal anterolateral myocardium. - Aortic valve: Mildly calcified annulus. - Mitral valve: Calcified annulus. Impressions:  - Compared to the previous study performed 03/29/2010, inferolateral wall motion abnormality is more prominent on the present examination.   LEXISCAN MYOVIEW - 03/19/12  IMPRESSION:  Overall low to moderate risk, abnormal Lexiscan Myoview as outlined. No diagnostic ST-segment changes were noted. No chest pain was reported. Perfusion imaging shows evidence of scar with periinfarct ischemia, overall moderate sized defect affecting the mid to apical  anterolateral wall. LVEF 53% with anteroapical hypokinesis.   CARDIAC CATHETERIZATION - 03/20/12  Hemodynamics:  LV pressure: 127/7  Aortic pressure: 125/9  Angiography  Left Main: minor luminal irregularities  Left anterior Descending: diffuse mild disease. There is a long stent in the proximal LAD. There is mild irregularities within the stent. No significant changes from previous tracings.  Left Circumflex: moderate sized branch, 1st OM moderate irregularities.  Right Coronary Artery: large and dominant, minor luminal irregularities. PDA and PLSA are unremarkable.  LV Gram: LAO and RAO views. mildly reduced LV function - EF 40-45%. There is akinesis of a small-medium sized area of the anterior lateral wall. This segmental wall motion abnormality was present at the time of his previous cath.   History of Present Illness  Derek Chen is a 51 yo male who was transferred from [facility] to [facility] with the above problem list. He has a history of CAD (s/p DES-mid LAD 03/2010), DM 2, HLD and anxiety. He presented to [facility] after experiencing central chest pain with left arm numbness, after a day of n/v/d, rated at a 8.5/10. Took NTG SL x 3 without relief, and his wife drove him to the ED. There, his NTG tablets were found to be expired. He received morphine and Dilaudid in the ED with some improvement. He received a GI cocktail without relief. Initial troponin returned WNL. EKG revealed no evidence of ischemia. CXR as above revealed no acute process. He was initially admitted by the medicine service.  Hospital Course    TTE as above revealed LVEF 45-50%, mild concentric hypertrophy, severe AK, severe HK of the mid-distal inferolateral myocardium extending inferoapically and severe HK to AK of the distal anterolateral myocardium. Given new WMAs on echo, Lexiscan Myoview was ordered revealing low-moderate risk study. There was evidence of  scar, but not ischemia. LVEF 53%. Given the inconclusive  stress test, the decision was made to transfer to [facility] for diagnostic cardiac catheterization. Cardiac enzymes at [facility] returned WNL. He remained stable en route, but did develop 7/10 chest pain in pre-cath hold which was tender to palpation. EKG ordered was without ST/T changes. He received MSO4 with some relieve. He was informed, consented and prepped for the procedure which is detailed above. This reveaeld diffuse mild LAD disease, long patent pLAD stent w/ mild irregularities, OM1 w/ moderate irregularities, minor luminal RCA irregularities, LVEF 40-45%, AK small-medium sized area of the anterolateral wall unchanged from prior cath. He tolerated the procedure well, without complications. He remained stable overnight without further symptoms.   He was evaluated by Dr. Eden Emms this morning who deemed him stable for discharge. He will follow-up in the office in 2-4 weeks and with his PCP for further chest pain work-up. Differential etiologies include inflammatory/musculoskeletal causes due to recent prior emesis or GI etiologies. He will be given a prescription for Protonix and advised to use NSAIDs for 1 week. This information, including post-cath instructions, have been clearly outlined in the discharge AVS. He will receive [facility] RN services.   Discharge Vitals:  Blood pressure 124/79, pulse 61, temperature 97.4 F (36.3 C), temperature source Oral, resp. rate 13, height 5' 11.5" (1.816 m), weight 83 kg (182 lb 15.7 oz), SpO2 93.00%.   Labs:  Lab 03/18/12 0433 03/17/12 1837  NA 133* 133*  K 3.8 3.7  CL 103 99  CO2 20 23  BUN 16 19  CREATININE 0.88 0.80  CALCIUM 8.0* 8.9  PROT 6.4 --  BILITOT 0.4 --  ALKPHOS 41 --  ALT 24 --  AST 14 --  AMYLASE -- --  LIPASE -- --  GLUCOSE 157* 186*   Disposition:  Discharge Orders    Future Orders Please Complete By Expires   Diet - low sodium heart healthy      Increase activity slowly        Follow-up Information    Follow up with  [facility]. (Office will call with appointment date and time. )    Contact information:   [address] [place] [address]       Follow up with HALL, CARRIE JEAN. (In 1-2 weeks for further evaluation/monitoring of chest pain. )    Contact information:   [address]. [facility] [place] [address] [phone]         Discharge Medications:    Medication List     As of 03/21/2012 10:17 AM    START taking these medications         pantoprazole 40 MG tablet   Commonly known as: PROTONIX   Take 1 tablet (40 mg total) by mouth daily.      CONTINUE taking these medications         aspirin EC 81 MG tablet      carvedilol 6.25 MG tablet   Commonly known as: COREG   Take 1 tablet (6.25 mg total) by mouth 2 (two) times daily with a meal.      clonazePAM 1 MG tablet   Commonly known as: KLONOPIN      clopidogrel 75 MG tablet   Commonly known as: PLAVIX      isosorbide mononitrate 60 MG 24 hr tablet   Commonly known as: IMDUR   Take 1 tablet (60 mg total) by mouth  daily.      metFORMIN 1000 MG tablet   Commonly known as: GLUCOPHAGE   Take 1 tablet (1,000 mg total) by mouth 2 (two) times daily.   Start taking on: 03/22/2012      nitroGLYCERIN 0.4 MG SL tablet   Commonly known as: NITROSTAT   Place 1 tablet (0.4 mg total) under the tongue every 5 (five) minutes as needed.      pravastatin 20 MG tablet   Commonly known as: PRAVACHOL      traZODone 50 MG tablet   Commonly known as: DESYREL          Where to get your medications    These are the prescriptions that you need to pick up. We sent them to a specific pharmacy, so you will need to go there to get them.   [facility] - [place], [place] - [address]    [address] [place] [address]    Phone: [phone]        nitroGLYCERIN 0.4 MG SL tablet   pantoprazole 40 MG tablet         Information on where to get these meds is not yet available. Ask  your nurse or doctor.         metFORMIN 1000 MG tablet           Outstanding Labs/Studies: None  Duration of Discharge Encounter: Greater than 30 minutes including physician time.  Signed, R. Hurman Horn, PA-C 03/21/2012, 10:18 AM

## 2012-03-21 NOTE — Progress Notes (Signed)
Patient ID: Derek Chen, male   DOB: April 22, 1960, 51 y.o.   MRN: [id number] SUBJECTIVE: No complaints and no chest pain  LABS: Basic Metabolic Panel: No results found for this basename: NA:2,K:2,CL:2,CO2:2,GLUCOSE:2,BUN:2,CREATININE:2,CALCIUM:2,MG:2,PHOS:2 in the last 72 hours Liver Function Tests: No results found for this basename: AST:2,ALT:2,ALKPHOS:2,BILITOT:2,PROT:2,ALBUMIN:2 in the last 72 hours CBC: No results found for this basename: WBC:2,NEUTROABS:2,HGB:2,HCT:2,MCV:2,PLT:2 in the last 72 hours Cardiac Enzymes: No results found for this basename: CKTOTAL:3,CKMB:3,CKMBINDEX:3,TROPONINI:3 in the last 72 hours D-Dimer: No results found for this basename: DDIMER:2 in the last 72 hours Thyroid Function Tests: No results found for this basename: TSH,T4TOTAL,FREET3,T3FREE,THYROIDAB in the last 72 hours  RADIOLOGY: Nm Myocar Single W/spect W/wall Motion And Ef  03/19/2012   IMPRESSION: Overall low to moderate risk, abnormal Lexiscan Myoview.  No diagnostic ST-segment changes were noted.  No chest pain was reported.  Perfusion imaging shows evidence of scar with periinfarct ischemia, overall moderate sized defect affecting the mid to apical anterolateral wall.  LVEF 53% with anteroapical hypokinesis.   Original Report Authenticated By: Nona Dell    PHYSICAL EXAM BP 124/79  Pulse 61  Temp 97.4 F (36.3 C) (Oral)  Resp 13  Ht 5' 11.5" (1.816 m)  Wt 182 lb 15.7 oz (83 kg)  BMI 25.17 kg/m2  SpO2 93% General: Well developed, well nourished, in no acute distress Head: Eyes PERRLA, No xanthomas.   Normal cephalic and atramatic  Lungs: Clear bilaterally to auscultation and percussion. Heart: HRRR S1 S2, S4 present. No M or R .  Pulses are 2+ & equal.            No carotid bruit. No JVD.  No abdominal bruits. No femoral bruits. Abdomen: Bowel sounds are positive, abdomen soft and non-tender without masses or                  Hernia's noted. Msk:  Back normal, normal gait. Normal  strength and tone for age. Extremities: No clubbing, cyanosis or edema.  DP +1 Neuro: Alert and oriented X 3. Psych:  Good affect, responds appropriately Right radial cath sight A with good pulse and no hematoma  TELEMETRY: Reviewed telemetry pt in NSR  ASSESSMENT AND PLAN:  1. Chest Pain: Coronary disease stable since 2011 stent patent.  Continue medical Rx . D/C home 2. Hyperlipidemia 3.Type II Diabetes 4. Anxiety 5.. Insomnia 6..Tobacco Abuse 7.. Hepatitis B Antibody Positive   [name] 8:44 AM 03/21/2012   [name], MD 03/21/2012, 8:41 AM

## 2012-03-27 DIAGNOSIS — I214 Non-ST elevation (NSTEMI) myocardial infarction: Secondary | ICD-10-CM | POA: Insufficient documentation

## 2012-03-27 DIAGNOSIS — R9430 Abnormal result of cardiovascular function study, unspecified: Secondary | ICD-10-CM | POA: Insufficient documentation

## 2012-03-27 DIAGNOSIS — I519 Heart disease, unspecified: Secondary | ICD-10-CM | POA: Insufficient documentation

## 2012-03-27 DIAGNOSIS — I2510 Atherosclerotic heart disease of native coronary artery without angina pectoris: Secondary | ICD-10-CM | POA: Insufficient documentation

## 2012-03-27 DIAGNOSIS — R768 Other specified abnormal immunological findings in serum: Secondary | ICD-10-CM | POA: Insufficient documentation

## 2012-03-27 DIAGNOSIS — F419 Anxiety disorder, unspecified: Secondary | ICD-10-CM | POA: Insufficient documentation

## 2012-03-29 DIAGNOSIS — R002 Palpitations: Secondary | ICD-10-CM | POA: Insufficient documentation

## 2012-03-29 DIAGNOSIS — R0600 Dyspnea, unspecified: Secondary | ICD-10-CM | POA: Insufficient documentation

## 2012-03-29 DIAGNOSIS — R0602 Shortness of breath: Secondary | ICD-10-CM | POA: Insufficient documentation

## 2012-04-13 ENCOUNTER — Telehealth: Payer: Self-pay | Admitting: Cardiology

## 2012-04-13 NOTE — Telephone Encounter (Signed)
FOLLOWING DEGENT PER VOICEMAIL MESSAGE PATIENT LEFT 04/12/12

## 2012-04-15 ENCOUNTER — Ambulatory Visit: Payer: 59 | Admitting: Physician Assistant

## 2012-04-17 ENCOUNTER — Encounter: Payer: Self-pay | Admitting: Cardiovascular Disease

## 2012-06-16 ENCOUNTER — Ambulatory Visit (INDEPENDENT_AMBULATORY_CARE_PROVIDER_SITE_OTHER): Payer: 59 | Admitting: Pharmacist Clinician (PhC)/ Clinical Pharmacy Specialist

## 2012-06-16 VITALS — BP 130/95 | HR 62 | Resp 18 | Ht 71.5 in | Wt 182.0 lb

## 2012-06-16 DIAGNOSIS — I2129 ST elevation (STEMI) myocardial infarction involving other sites: Secondary | ICD-10-CM

## 2012-06-16 NOTE — Progress Notes (Signed)
Subjective:    Patient ID: Derek Chen, male    DOB: 28-May-1960, 52 y.o.   MRN: [id number]  HPI Type 2 DM with patient concerns over the use of metformin potentially causing further damage to his heart.  Patient has had 3 MIs, the first one at age 59.  He comes in today with questions regarding changing from metformin to victoza.    Review of Systems  Constitutional: Negative.   Cardiovascular: Negative.   Genitourinary: Positive for frequency. Negative for dysuria and difficulty urinating.  Skin: Negative.   Neurological: Negative.   Psychiatric/Behavioral: Negative.        Objective:   Physical Exam  Constitutional: He is oriented to person, place, and time. He appears well-developed and well-nourished.  Cardiovascular: Regular rhythm.   Neurological: He is alert and oriented to person, place, and time.  Psychiatric: He has a normal mood and affect. His behavior is normal. Judgment and thought content normal.          Assessment & Plan:   Diabetes Flow Sheet:  Visit 1  Chief Complaint:  No chief complaint on file.    Exam Regularity:  RRR Edema:  neg  Polyuria:  positive  Polydipsia:  neg Polyphagia:  neg  BMI:  Body mass index is 25.03 kg/(m^2).   Weight changes:  stable General Appearance:  alert, oriented, no acute distress Mood/Affect:  normal  Low fat/carbohydrate diet?  Yes Nicotine Abuse?  No Medication Compliance?  Yes Exercise?  Yes Alcohol Abuse?  No   Lab Results  Component Value Date   HGBA1C  Value: 7.1                                                                     06/11/2012    Lab Results  Component Value Date   CHOL  Value: 243        ATP III CLASSIFICATION:  <200     mg/dL   Desirable  409-811  mg/dL   Borderline High  >=914    mg/dL   High       * 78/29/5621   HDL 40  03/12/2012   LDLCALC 111 mg/dL 30/86/5784   TRIG 696 29/52/8413   CHOLHDL       Medication Checklist: ACE Inhibitor/ARB?  No Lipid Lowering Agent?   Yes Aspirin?  No Oral Hypoglycemic Agent(s)?  Yes  Assessment: 1.  type 2 Diabetes.  Good control 2.  Blood Pressure Control.  Diastolic elevations 3.  Lipid Control.  Elevated above goal of 100mg /dL 4.  Gave patient urine collection cup to check first am microalbuminuria and plan on starting an ACEI.   Last EF was 55%.  Patient would benefit from cardiac and renal standpoint with starting an ACEI. 5.  Patient will continue to take metformin.  I counseled him on the risks versus benefit of taking metformin versus switching to victoza.  Recommendations: 1.  1800 calorie, carbohydrate counting diet.  Patient is counseled extensively on carbohydrate counting, serving sizes, saturated fat intake and meal planning.  Patient is instructed to eat 3 meals a day and 3 small snacks.  Patient will supplement snacks based on physical activity. 2.  30 minutes of physical activity.  Patient is counseled to always  carry glucose tablets, lifesavers, hard candies, etc., while exercising in case of hypoglycemic event. 3.  Patient is counseled on pathophysiology of diabetes and the risk of long-term complications.  Fasting blood glucose goals are 80-120mg /dL.  Post-prandial goals are < 140.  A1C goals < 6.5%. 4.  LDL goal of < 100, HDL > 40 and TG < 150; BP goal < 130/80 5.  Patient is counseled on proper use of glucometer and lancing device.  Patient is informed how often to test and how to respond to unsuitable results. 6.  Medication recommendations at this time are as follows:  Continue same medications with plan to start an ACEI  Time spent counseling patient:  68  Physician time spent with patient:  0 Referring provider:  Bennie Pierini   PharmD:  [facility] Pharmacist  Chari Manning

## 2012-06-18 ENCOUNTER — Other Ambulatory Visit: Payer: Self-pay | Admitting: Nurse Practitioner

## 2012-06-18 ENCOUNTER — Telehealth: Payer: Self-pay | Admitting: Nurse Practitioner

## 2012-06-18 MED ORDER — FLUOXETINE HCL 20 MG PO CAPS: 20.0000 mg | ORAL_CAPSULE | Freq: Every day | ORAL | Status: DC

## 2012-06-18 MED ORDER — CLOPIDOGREL BISULFATE 75 MG PO TABS: 75.0000 mg | ORAL_TABLET | Freq: Every day | ORAL | Status: DC

## 2012-06-18 MED ORDER — ISOSORBIDE MONONITRATE ER 60 MG PO TB24: 60.0000 mg | ORAL_TABLET | Freq: Every day | ORAL | Status: DC

## 2012-06-18 MED ORDER — TRAZODONE HCL 50 MG PO TABS: 75.0000 mg | ORAL_TABLET | Freq: Every day | ORAL | Status: DC | PRN

## 2012-06-18 MED ORDER — PANTOPRAZOLE SODIUM 40 MG PO TBEC: 40.0000 mg | DELAYED_RELEASE_TABLET | Freq: Every day | ORAL | Status: DC

## 2012-06-18 MED ORDER — SITAGLIPTIN PHOSPHATE 100 MG PO TABS: 100.0000 mg | ORAL_TABLET | Freq: Every day | ORAL | Status: DC

## 2012-06-18 MED ORDER — METFORMIN HCL 1000 MG PO TABS: 1000.0000 mg | ORAL_TABLET | Freq: Two times a day (BID) | ORAL | Status: DC

## 2012-06-18 MED ORDER — METOPROLOL SUCCINATE ER 50 MG PO TB24: 50.0000 mg | ORAL_TABLET | Freq: Every day | ORAL | Status: DC

## 2012-06-18 NOTE — Telephone Encounter (Signed)
Needs mail order rxs for all meds.  Insurance won't pay otherwise. Please call

## 2012-06-18 NOTE — Telephone Encounter (Signed)
Can we write all med's for mail order?

## 2012-06-19 ENCOUNTER — Other Ambulatory Visit: Payer: Self-pay | Admitting: Nurse Practitioner

## 2012-06-24 ENCOUNTER — Encounter: Payer: Self-pay | Admitting: *Deleted

## 2012-06-24 NOTE — Telephone Encounter (Signed)
  This encounter was created in error - please disregard. RX ALREADY WRITTEN BY [facility]

## 2012-06-29 ENCOUNTER — Telehealth: Payer: Self-pay | Admitting: Nurse Practitioner

## 2012-06-29 ENCOUNTER — Other Ambulatory Visit: Payer: Self-pay | Admitting: Nurse Practitioner

## 2012-06-29 MED ORDER — FLUOXETINE HCL 20 MG PO CAPS: 20.0000 mg | ORAL_CAPSULE | Freq: Every day | ORAL | Status: DC

## 2012-06-29 MED ORDER — PANTOPRAZOLE SODIUM 40 MG PO TBEC: 40.0000 mg | DELAYED_RELEASE_TABLET | Freq: Every day | ORAL | Status: DC

## 2012-06-29 MED ORDER — CLONAZEPAM 1 MG PO TABS: 1.0000 mg | ORAL_TABLET | Freq: Two times a day (BID) | ORAL | Status: DC | PRN

## 2012-06-29 NOTE — Telephone Encounter (Signed)
Rxs up front. Patient notified

## 2012-06-29 NOTE — Telephone Encounter (Signed)
Needs 90 day mail order rxs for clonazepam and protonix

## 2012-06-29 NOTE — Progress Notes (Signed)
Can he get a 90 day rx for mail order for the clonazepam and protonix

## 2012-07-06 ENCOUNTER — Telehealth: Payer: Self-pay | Admitting: Nurse Practitioner

## 2012-07-06 NOTE — Telephone Encounter (Signed)
Please advise 

## 2012-07-06 NOTE — Telephone Encounter (Signed)
Ok--if we have

## 2012-07-07 NOTE — Telephone Encounter (Signed)
Pt samples up front of spiriva (#2), no flovent available.- jhb

## 2012-07-16 ENCOUNTER — Ambulatory Visit: Payer: Self-pay

## 2012-07-20 ENCOUNTER — Telehealth: Payer: Self-pay | Admitting: Nurse Practitioner

## 2012-07-20 NOTE — Telephone Encounter (Signed)
Two boxes of flovent samples at front for pt. Per MMM.  He says his pulmonologist also gives him symbicort and spiriva.

## 2012-07-20 NOTE — Telephone Encounter (Signed)
Ok for samples?

## 2012-07-28 ENCOUNTER — Telehealth: Payer: Self-pay | Admitting: General Practice

## 2012-07-28 NOTE — Telephone Encounter (Signed)
Ok for samples?

## 2012-07-28 NOTE — Telephone Encounter (Signed)
Samples up front 

## 2012-08-13 ENCOUNTER — Telehealth: Payer: Self-pay | Admitting: Nurse Practitioner

## 2012-08-13 NOTE — Telephone Encounter (Signed)
crestor 10 samples given - up front and pt aware Hes been using flovent- but we dont have any samples now= What can he change to that we have samples of to replace flovent?  We need to call pt after addressed

## 2012-08-13 NOTE — Telephone Encounter (Signed)
We do not have anything tat is sample that is like flovent-wILL ntbs IF NEEDS A DIFFERENT INHALER

## 2012-08-14 NOTE — Telephone Encounter (Signed)
Pt aware will need appt if wants to change inhaler

## 2012-08-18 ENCOUNTER — Telehealth: Payer: Self-pay | Admitting: Nurse Practitioner

## 2012-08-19 MED ORDER — FLUTICASONE PROPIONATE 50 MCG/ACT NA SUSP: 2.0000 | Freq: Every day | NASAL | Status: DC

## 2012-08-19 NOTE — Telephone Encounter (Signed)
Pt aware med at pharmacy

## 2012-08-19 NOTE — Telephone Encounter (Signed)
Pt states allergies are really bad and is taking otc medication is there anything else we can do.

## 2012-08-19 NOTE — Telephone Encounter (Signed)
rx or flonase sent to pharmacy

## 2012-09-01 ENCOUNTER — Encounter (HOSPITAL_COMMUNITY): Payer: 59

## 2012-09-11 ENCOUNTER — Ambulatory Visit (INDEPENDENT_AMBULATORY_CARE_PROVIDER_SITE_OTHER): Payer: 59 | Admitting: Nurse Practitioner

## 2012-09-11 ENCOUNTER — Encounter: Payer: Self-pay | Admitting: Nurse Practitioner

## 2012-09-11 VITALS — BP 148/91 | HR 60 | Temp 97.0°F | Ht 71.0 in | Wt 180.0 lb

## 2012-09-11 DIAGNOSIS — E785 Hyperlipidemia, unspecified: Secondary | ICD-10-CM

## 2012-09-11 DIAGNOSIS — I10 Essential (primary) hypertension: Secondary | ICD-10-CM

## 2012-09-11 DIAGNOSIS — E119 Type 2 diabetes mellitus without complications: Secondary | ICD-10-CM

## 2012-09-11 DIAGNOSIS — J441 Chronic obstructive pulmonary disease with (acute) exacerbation: Secondary | ICD-10-CM

## 2012-09-11 DIAGNOSIS — I25810 Atherosclerosis of coronary artery bypass graft(s) without angina pectoris: Secondary | ICD-10-CM

## 2012-09-11 DIAGNOSIS — F411 Generalized anxiety disorder: Secondary | ICD-10-CM

## 2012-09-11 DIAGNOSIS — G4700 Insomnia, unspecified: Secondary | ICD-10-CM

## 2012-09-11 LAB — COMPLETE METABOLIC PANEL WITH GFR
ALT: 28 U/L (ref 0–53)
BUN: 16 mg/dL (ref 6–23)
CO2: 22 mEq/L (ref 19–32)
Calcium: 9.4 mg/dL (ref 8.4–10.5)
Creat: 1.01 mg/dL (ref 0.50–1.35)
GFR, Est African American: >89 mL/min
GFR, Est Non African American: 86 mL/min
Total Bilirubin: 0.7 mg/dL (ref 0.3–1.2)

## 2012-09-11 LAB — POCT GLYCOSYLATED HEMOGLOBIN (HGB A1C): Hemoglobin A1C: 7.7

## 2012-09-11 MED ORDER — CLONAZEPAM 2 MG PO TABS: 2.0000 mg | ORAL_TABLET | Freq: Three times a day (TID) | ORAL | Status: DC | PRN

## 2012-09-11 MED ORDER — LISINOPRIL 20 MG PO TABS: 20.0000 mg | ORAL_TABLET | Freq: Every day | ORAL | Status: DC

## 2012-09-11 MED ORDER — SITAGLIPTIN PHOSPHATE 100 MG PO TABS: 100.0000 mg | ORAL_TABLET | Freq: Every day | ORAL | Status: DC

## 2012-09-11 MED ORDER — FLUOXETINE HCL 40 MG PO CAPS: 40.0000 mg | ORAL_CAPSULE | Freq: Every day | ORAL | Status: DC

## 2012-09-11 NOTE — Patient Instructions (Signed)
Health Maintenance, Males A healthy lifestyle and preventative care can promote health and wellness.  Maintain regular health, dental, and eye exams.  Eat a healthy diet. Foods like vegetables, fruits, whole grains, low-fat dairy products, and lean protein foods contain the nutrients you need without too many calories. Decrease your intake of foods high in solid fats, added sugars, and salt. Get information about a proper diet from your caregiver, if necessary.  Regular physical exercise is one of the most important things you can do for your health. Most adults should get at least 150 minutes of moderate-intensity exercise (any activity that increases your heart rate and causes you to sweat) each week. In addition, most adults need muscle-strengthening exercises on 2 or more days a week.   Maintain a healthy weight. The body mass index (BMI) is a screening tool to identify possible weight problems. It provides an estimate of body fat based on height and weight. Your caregiver can help determine your BMI, and can help you achieve or maintain a healthy weight. For adults 20 years and older:  A BMI below 18.5 is considered underweight.  A BMI of 18.5 to 24.9 is normal.  A BMI of 25 to 29.9 is considered overweight.  A BMI of 30 and above is considered obese.  Maintain normal blood lipids and cholesterol by exercising and minimizing your intake of saturated fat. Eat a balanced diet with plenty of fruits and vegetables. Blood tests for lipids and cholesterol should begin at age 20 and be repeated every 5 years. If your lipid or cholesterol levels are high, you are over 50, or you are a high risk for heart disease, you may need your cholesterol levels checked more frequently.Ongoing high lipid and cholesterol levels should be treated with medicines, if diet and exercise are not effective.  If you smoke, find out from your caregiver how to quit. If you do not use tobacco, do not start.  If you  choose to drink alcohol, do not exceed 2 drinks per day. One drink is considered to be 12 ounces (355 mL) of beer, 5 ounces (148 mL) of wine, or 1.5 ounces (44 mL) of liquor.  Avoid use of street drugs. Do not share needles with anyone. Ask for help if you need support or instructions about stopping the use of drugs.  High blood pressure causes heart disease and increases the risk of stroke. Blood pressure should be checked at least every 1 to 2 years. Ongoing high blood pressure should be treated with medicines if weight loss and exercise are not effective.  If you are 45 to 52 years old, ask your caregiver if you should take aspirin to prevent heart disease.  Diabetes screening involves taking a blood sample to check your fasting blood sugar level. This should be done once every 3 years, after age 45, if you are within normal weight and without risk factors for diabetes. Testing should be considered at a younger age or be carried out more frequently if you are overweight and have at least 1 risk factor for diabetes.  Colorectal cancer can be detected and often prevented. Most routine colorectal cancer screening begins at the age of 50 and continues through age 75. However, your caregiver may recommend screening at an earlier age if you have risk factors for colon cancer. On a yearly basis, your caregiver may provide home test kits to check for hidden blood in the stool. Use of a small camera at the end of a tube,   to directly examine the colon (sigmoidoscopy or colonoscopy), can detect the earliest forms of colorectal cancer. Talk to your caregiver about this at age 50, when routine screening begins. Direct examination of the colon should be repeated every 5 to 10 years through age 75, unless early forms of pre-cancerous polyps or small growths are found.  Hepatitis C blood testing is recommended for all people born from 1945 through 1965 and any individual with known risks for hepatitis C.  Healthy  men should no longer receive prostate-specific antigen (PSA) blood tests as part of routine cancer screening. Consult with your caregiver about prostate cancer screening.  Testicular cancer screening is not recommended for adolescents or adult males who have no symptoms. Screening includes self-exam, caregiver exam, and other screening tests. Consult with your caregiver about any symptoms you have or any concerns you have about testicular cancer.  Practice safe sex. Use condoms and avoid high-risk sexual practices to reduce the spread of sexually transmitted infections (STIs).  Use sunscreen with a sun protection factor (SPF) of 30 or greater. Apply sunscreen liberally and repeatedly throughout the day. You should seek shade when your shadow is shorter than you. Protect yourself by wearing long sleeves, pants, a wide-brimmed hat, and sunglasses year round, whenever you are outdoors.  Notify your caregiver of new moles or changes in moles, especially if there is a change in shape or color. Also notify your caregiver if a mole is larger than the size of a pencil eraser.  A one-time screening for abdominal aortic aneurysm (AAA) and surgical repair of large AAAs by sound wave imaging (ultrasonography) is recommended for ages 65 to 75 years who are current or former smokers.  Stay current with your immunizations. Document Released: 09/14/2007 Document Revised: 06/10/2011 Document Reviewed: 08/13/2010 [employer] Patient Information 2014 [employer].  

## 2012-09-11 NOTE — Progress Notes (Signed)
Subjective:    Patient ID: Derek Chen, male    DOB: December 01, 1960, 52 y.o.   MRN: [id number]  Diabetes He presents for his follow-up diabetic visit. He has type 2 diabetes mellitus. No MedicAlert identification noted. The initial diagnosis of diabetes was made 4 years ago. His disease course has been stable. There are no hypoglycemic associated symptoms. Associated symptoms include chest pain (angina). Pertinent negatives for diabetes include no foot paresthesias, no foot ulcerations, no polydipsia, no polyphagia, no polyuria and no visual change. There are no hypoglycemic complications. Symptoms are stable. Diabetic complications include heart disease. Risk factors for coronary artery disease include dyslipidemia, hypertension, male sex and stress. Current diabetic treatment includes oral agent (dual therapy). He is compliant with treatment most of the time. His weight is stable. When asked about meal planning, he reported none. He has not had a previous visit with a dietician. He rarely participates in exercise. His breakfast blood glucose is taken between 7-8 am. His breakfast blood glucose range is generally 110-130 mg/dl. His highest blood glucose is >200 mg/dl. His overall blood glucose range is 110-130 mg/dl. An ACE inhibitor/angiotensin II receptor blocker is not being taken. He does not see a podiatrist.Eye exam is current (january 2013- patien tencouraged to make apppointment.).  Hypertension This is a chronic problem. The current episode started more than 1 year ago. The problem has been waxing and waning since onset. The problem is uncontrolled. Associated symptoms include chest pain (angina) and palpitations. Pertinent negatives include no peripheral edema or shortness of breath. There are no associated agents to hypertension. Risk factors for coronary artery disease include diabetes mellitus, dyslipidemia, male gender and stress. Past treatments include beta blockers and direct vasodilators. The  current treatment provides moderate improvement. Hypertensive end-organ damage includes CAD/MI (MI X3).  Hyperlipidemia This is a chronic problem. The current episode started more than 1 year ago. The problem is controlled. Recent lipid tests were reviewed and are high. Exacerbating diseases include diabetes. Factors aggravating his hyperlipidemia include beta blockers. Associated symptoms include chest pain (angina). Pertinent negatives include no shortness of breath. Current antihyperlipidemic treatment includes statins. The current treatment provides moderate improvement of lipids. Compliance problems include adherence to diet and adherence to exercise.  Risk factors for coronary artery disease include diabetes mellitus, dyslipidemia, male sex and a sedentary lifestyle.  GAD Severe issue- currently on klonopin 1mg  TID and it is not enough- and is also on prozac 20 mg qd- which isn't keeping him under control at all. Insomnia trazadone- sleeps 5-6 at night CAD Needs new cardiologist- doesn't like [facility] (DR. Degent no longer there)- On Imdur- has occassional chest pain. COPD Sprivia and flovent- Sees pulmonologist  Review of Systems  Respiratory: Negative for shortness of breath.   Cardiovascular: Positive for chest pain (angina) and palpitations.  Endocrine: Negative for polydipsia, polyphagia and polyuria.  All other systems reviewed and are negative.       Objective:   Physical Exam  Constitutional: He is oriented to person, place, and time. He appears well-developed and well-nourished.  HENT:  Head: Normocephalic.  Right Ear: External ear normal.  Left Ear: External ear normal.  Nose: Nose normal.  Mouth/Throat: Oropharynx is clear and moist.  Eyes: EOM are normal. Pupils are equal, round, and reactive to light.  Neck: Normal range of motion. Neck supple. No thyromegaly present.  Cardiovascular: Normal rate, regular rhythm, normal heart sounds and intact distal pulses.   No  murmur heard. Pulmonary/Chest: Effort normal and breath sounds  normal. He has no wheezes. He has no rales.  Abdominal: Soft. Bowel sounds are normal.  Genitourinary:  Refuses DRE today  Musculoskeletal: Normal range of motion.  Neurological: He is alert and oriented to person, place, and time.  Skin: Skin is warm and dry.  Psychiatric: He has a normal mood and affect. His behavior is normal. Judgment and thought content normal.   BP 148/91  Pulse 60  Temp(Src) 97 F (36.1 C) (Oral)  Ht 5\' 11"  (1.803 m)  Wt 180 lb (81.647 kg)  BMI 25.12 kg/m2  Results for orders placed in visit on 09/11/12  POCT GLYCOSYLATED HEMOGLOBIN (HGB A1C)      Result Value Range   Hemoglobin A1C 7.7%           Assessment & Plan:  1. Diabetes Low carb diet Increase dose of januvia to 100mg  a day Continue to check blood sugars fasting in AM - POCT glycosylated hemoglobin (Hb A1C) - sitaGLIPtin (JANUVIA) 100 MG tablet; Take 1 tablet (100 mg total) by mouth daily.  Dispense: 30 tablet; Refill: 0  2. Other and unspecified hyperlipidemia Low fat diet and exercise - COMPLETE METABOLIC PANEL WITH GFR - NMR Lipoprofile with Lipids  3. GAD (generalized anxiety disorder) Stress management Increased klonopin to 2 mg TID Also increased prozac dose to 40mg  - clonazePAM (KLONOPIN) 2 MG tablet; Take 1 tablet (2 mg total) by mouth 3 (three) times daily as needed for anxiety.  Dispense: 90 tablet; Refill: 0 - FLUoxetine (PROZAC) 40 MG capsule; Take 1 capsule (40 mg total) by mouth daily.  Dispense: 90 capsule; Refill: 3  4. Hypertension Added lisinopril to current meds - lisinopril (PRINIVIL,ZESTRIL) 20 MG tablet; Take 1 tablet (20 mg total) by mouth daily.  Dispense: 30 tablet; Refill: 5  5. Insomnia Continue current meds   6. COPD exacerbation Continue current inhalers Keep follow-up with pulmonologist  7. CAD (coronary artery disease) of artery bypass graft Referral to cardiologist in  [facility] India Hook, FNP

## 2012-09-14 LAB — NMR LIPOPROFILE WITH LIPIDS
HDL Particle Number: 27 umol/L — ABNORMAL LOW (ref >=30.5)
HDL Size: 7.9 nm — ABNORMAL LOW (ref >=9.2)
HDL-C: 36 mg/dL — ABNORMAL LOW (ref >=40)
LDL (calc): 79 mg/dL (ref <100)
LDL Particle Number: 1648 nmol/L — ABNORMAL HIGH (ref <1000)
LDL Size: 19.9 nm — ABNORMAL LOW (ref >20.5)
LP-IR Score: 66 — ABNORMAL HIGH (ref <=45)
Small LDL Particle Number: 1215 nmol/L — ABNORMAL HIGH (ref <=527)
VLDL Size: 44.8 nm (ref <=46.6)

## 2012-09-15 ENCOUNTER — Encounter (HOSPITAL_COMMUNITY)
Admission: RE | Admit: 2012-09-15 | Discharge: 2012-09-15 | Disposition: A | Discharge status: Home / Self Care | Payer: 59 | Source: Ambulatory Visit | Attending: Pediatric Pulmonology | Admitting: Pediatric Pulmonology

## 2012-09-15 ENCOUNTER — Encounter (HOSPITAL_COMMUNITY): Payer: Self-pay

## 2012-09-15 ENCOUNTER — Telehealth: Payer: Self-pay | Admitting: Nurse Practitioner

## 2012-09-15 VITALS — BP 132/82 | HR 54 | Ht 71.0 in | Wt 181.6 lb

## 2012-09-15 DIAGNOSIS — J449 Chronic obstructive pulmonary disease, unspecified: Secondary | ICD-10-CM

## 2012-09-15 NOTE — Progress Notes (Signed)
Patient has been referred to PR by Dr. Ronnette Juniper due to COPD 496. During orientation advised patient on arrival and appointment times what to wear, what to do before, during and after exercise. Reviewed attendance and class policy. Talked about inclement weather and class consultation policy. Pt is scheduled to start Pulmonary Rehab on 10/05/12 at 2:30. Pt was advised to come to class 5 minutes before class starts. He was also given instructions on meeting with the dietician and attending the Family Structure classes. Pt is eager to get started.

## 2012-09-15 NOTE — Telephone Encounter (Signed)
Please advise 

## 2012-09-15 NOTE — Patient Instructions (Signed)
Pt has finished orientation and is scheduled to start PR on _7/7/14 at 2:30pm. Pt has been instructed to arrive to class 15 minutes early for scheduled class. Pt has been instructed to wear comfortable clothing and shoes with rubber soles. Pt has been told to take their medications 1 hour prior to coming to class.  If the patient is not going to attend class, he/she has been instructed to call.

## 2012-09-16 ENCOUNTER — Telehealth: Payer: Self-pay | Admitting: Nurse Practitioner

## 2012-09-16 NOTE — Telephone Encounter (Signed)
Write letter to who?

## 2012-09-16 NOTE — Telephone Encounter (Signed)
Pateint was denied his social security diablility wants you to right a note saying all what he was dx with and his problems

## 2012-09-17 NOTE — Telephone Encounter (Signed)
Address letter to:  To Whom it May Concern.

## 2012-09-19 ENCOUNTER — Encounter: Payer: Self-pay | Admitting: Nurse Practitioner

## 2012-09-19 NOTE — Telephone Encounter (Signed)
PT AWARE LETTER UP FRONT TO PICK UP

## 2012-09-19 NOTE — Telephone Encounter (Signed)
Letter written and ready for pick up.

## 2012-09-21 ENCOUNTER — Telehealth: Payer: Self-pay | Admitting: Nurse Practitioner

## 2012-09-21 NOTE — Telephone Encounter (Signed)
Do not have any. Would like RX called in

## 2012-09-21 NOTE — Telephone Encounter (Signed)
Patient aware we have no samples available and he already has rx

## 2012-09-21 NOTE — Telephone Encounter (Signed)
Mmm please address? 

## 2012-09-27 ENCOUNTER — Encounter: Payer: Self-pay | Admitting: Cardiology

## 2012-09-27 DIAGNOSIS — I429 Cardiomyopathy, unspecified: Secondary | ICD-10-CM | POA: Insufficient documentation

## 2012-09-28 ENCOUNTER — Encounter: Payer: 59 | Admitting: Cardiology

## 2012-09-28 ENCOUNTER — Encounter: Payer: Self-pay | Admitting: Cardiology

## 2012-09-28 NOTE — Progress Notes (Signed)
No show  This encounter was created in error - please disregard.

## 2012-10-05 ENCOUNTER — Encounter (HOSPITAL_COMMUNITY)
Admission: RE | Admit: 2012-10-05 | Discharge: 2012-10-05 | Disposition: A | Discharge status: Home / Self Care | Payer: 59 | Source: Ambulatory Visit | Attending: Pediatric Pulmonology | Admitting: Pediatric Pulmonology

## 2012-10-05 DIAGNOSIS — Z5189 Encounter for other specified aftercare: Secondary | ICD-10-CM | POA: Insufficient documentation

## 2012-10-05 DIAGNOSIS — J449 Chronic obstructive pulmonary disease, unspecified: Secondary | ICD-10-CM | POA: Insufficient documentation

## 2012-10-05 DIAGNOSIS — J4489 Other specified chronic obstructive pulmonary disease: Secondary | ICD-10-CM | POA: Insufficient documentation

## 2012-10-07 ENCOUNTER — Encounter (HOSPITAL_COMMUNITY)
Admission: RE | Admit: 2012-10-07 | Discharge: 2012-10-07 | Disposition: A | Discharge status: Home / Self Care | Payer: 59 | Source: Ambulatory Visit | Attending: Pediatric Pulmonology | Admitting: Pediatric Pulmonology

## 2012-10-12 ENCOUNTER — Encounter (HOSPITAL_COMMUNITY): Payer: 59

## 2012-10-14 ENCOUNTER — Encounter (HOSPITAL_COMMUNITY): Payer: 59

## 2012-10-16 ENCOUNTER — Telehealth: Payer: Self-pay | Admitting: Nurse Practitioner

## 2012-10-16 NOTE — Telephone Encounter (Signed)
Patient notified that Januvia samples up front. No crestor available

## 2012-10-19 ENCOUNTER — Encounter (HOSPITAL_COMMUNITY): Payer: 59

## 2012-10-19 ENCOUNTER — Other Ambulatory Visit: Payer: Self-pay | Admitting: *Deleted

## 2012-10-19 DIAGNOSIS — F411 Generalized anxiety disorder: Secondary | ICD-10-CM

## 2012-10-19 MED ORDER — METFORMIN HCL 1000 MG PO TABS: 1000.0000 mg | ORAL_TABLET | Freq: Two times a day (BID) | ORAL | Status: DC

## 2012-10-19 MED ORDER — METOPROLOL SUCCINATE ER 50 MG PO TB24: 50.0000 mg | ORAL_TABLET | Freq: Every day | ORAL | Status: DC

## 2012-10-19 MED ORDER — CLONAZEPAM 2 MG PO TABS: 2.0000 mg | ORAL_TABLET | Freq: Three times a day (TID) | ORAL | Status: DC | PRN

## 2012-10-19 NOTE — Telephone Encounter (Signed)
LAST OV 6/14. CALL IN [place]  IF APPROVED [phone]

## 2012-10-19 NOTE — Telephone Encounter (Signed)
LAST LABS 6/14. I DIDN'T SEE THAT LABS HAD BEEN REVIEWED. PLEASE REVIEW AND FILL MED IF OK. THANKS.

## 2012-10-20 NOTE — Telephone Encounter (Signed)
RX FOR CLONAZEPAM CALLED IN [place].

## 2012-10-21 ENCOUNTER — Encounter (HOSPITAL_COMMUNITY): Payer: 59

## 2012-10-26 ENCOUNTER — Encounter (HOSPITAL_COMMUNITY): Payer: 59

## 2012-10-28 ENCOUNTER — Encounter (HOSPITAL_COMMUNITY): Payer: 59

## 2012-11-02 ENCOUNTER — Encounter (HOSPITAL_COMMUNITY)
Admission: RE | Admit: 2012-11-02 | Discharge: 2012-11-02 | Disposition: A | Discharge status: Home / Self Care | Payer: 59 | Source: Ambulatory Visit | Attending: Pediatric Pulmonology | Admitting: Pediatric Pulmonology

## 2012-11-02 DIAGNOSIS — J4489 Other specified chronic obstructive pulmonary disease: Secondary | ICD-10-CM | POA: Insufficient documentation

## 2012-11-02 DIAGNOSIS — J449 Chronic obstructive pulmonary disease, unspecified: Secondary | ICD-10-CM | POA: Insufficient documentation

## 2012-11-02 DIAGNOSIS — Z5189 Encounter for other specified aftercare: Secondary | ICD-10-CM | POA: Insufficient documentation

## 2012-11-04 ENCOUNTER — Encounter (HOSPITAL_COMMUNITY)
Admission: RE | Admit: 2012-11-04 | Discharge: 2012-11-04 | Disposition: A | Discharge status: Home / Self Care | Payer: 59 | Source: Ambulatory Visit | Attending: Pediatric Pulmonology | Admitting: Pediatric Pulmonology

## 2012-11-06 ENCOUNTER — Ambulatory Visit (INDEPENDENT_AMBULATORY_CARE_PROVIDER_SITE_OTHER): Payer: 59 | Admitting: Nurse Practitioner

## 2012-11-06 ENCOUNTER — Encounter: Payer: Self-pay | Admitting: Nurse Practitioner

## 2012-11-06 VITALS — BP 114/78 | HR 63 | Temp 96.8°F | Ht 71.0 in | Wt 177.0 lb

## 2012-11-06 DIAGNOSIS — N400 Enlarged prostate without lower urinary tract symptoms: Secondary | ICD-10-CM

## 2012-11-06 DIAGNOSIS — K219 Gastro-esophageal reflux disease without esophagitis: Secondary | ICD-10-CM

## 2012-11-06 DIAGNOSIS — E559 Vitamin D deficiency, unspecified: Secondary | ICD-10-CM

## 2012-11-06 DIAGNOSIS — F411 Generalized anxiety disorder: Secondary | ICD-10-CM

## 2012-11-06 DIAGNOSIS — I10 Essential (primary) hypertension: Secondary | ICD-10-CM

## 2012-11-06 DIAGNOSIS — R5383 Other fatigue: Secondary | ICD-10-CM

## 2012-11-06 DIAGNOSIS — IMO0001 Reserved for inherently not codable concepts without codable children: Secondary | ICD-10-CM

## 2012-11-06 DIAGNOSIS — E785 Hyperlipidemia, unspecified: Secondary | ICD-10-CM

## 2012-11-06 DIAGNOSIS — I2510 Atherosclerotic heart disease of native coronary artery without angina pectoris: Secondary | ICD-10-CM

## 2012-11-06 DIAGNOSIS — E119 Type 2 diabetes mellitus without complications: Secondary | ICD-10-CM

## 2012-11-06 DIAGNOSIS — E1165 Type 2 diabetes mellitus with hyperglycemia: Secondary | ICD-10-CM

## 2012-11-06 DIAGNOSIS — J449 Chronic obstructive pulmonary disease, unspecified: Secondary | ICD-10-CM

## 2012-11-06 LAB — POCT GLYCOSYLATED HEMOGLOBIN (HGB A1C): Hemoglobin A1C: 7.8

## 2012-11-06 MED ORDER — PANTOPRAZOLE SODIUM 40 MG PO TBEC: 40.0000 mg | DELAYED_RELEASE_TABLET | Freq: Every day | ORAL | Status: DC

## 2012-11-06 MED ORDER — GLIMEPIRIDE 2 MG PO TABS: 2.0000 mg | ORAL_TABLET | Freq: Every day | ORAL | Status: DC

## 2012-11-06 MED ORDER — SITAGLIPTIN PHOSPHATE 100 MG PO TABS: 100.0000 mg | ORAL_TABLET | Freq: Every day | ORAL | Status: DC

## 2012-11-06 MED ORDER — METOPROLOL SUCCINATE ER 50 MG PO TB24: 50.0000 mg | ORAL_TABLET | Freq: Every day | ORAL | Status: DC

## 2012-11-06 MED ORDER — FLUOXETINE HCL 40 MG PO CAPS: 40.0000 mg | ORAL_CAPSULE | Freq: Every day | ORAL | Status: DC

## 2012-11-06 MED ORDER — CLOPIDOGREL BISULFATE 75 MG PO TABS: 75.0000 mg | ORAL_TABLET | Freq: Every day | ORAL | Status: DC

## 2012-11-06 MED ORDER — BECLOMETHASONE DIPROPIONATE 80 MCG/ACT IN AERS: 1.0000 | INHALATION_SPRAY | RESPIRATORY_TRACT | Status: DC | PRN

## 2012-11-06 MED ORDER — LISINOPRIL 20 MG PO TABS: 20.0000 mg | ORAL_TABLET | Freq: Every day | ORAL | Status: DC

## 2012-11-06 MED ORDER — METFORMIN HCL 1000 MG PO TABS: 1000.0000 mg | ORAL_TABLET | Freq: Two times a day (BID) | ORAL | Status: DC

## 2012-11-06 MED ORDER — TIOTROPIUM BROMIDE MONOHYDRATE 18 MCG IN CAPS: 1.0000 | ORAL_CAPSULE | Freq: Once | RESPIRATORY_TRACT | Status: DC

## 2012-11-06 MED ORDER — TRAZODONE HCL 50 MG PO TABS: 75.0000 mg | ORAL_TABLET | Freq: Every day | ORAL | Status: DC | PRN

## 2012-11-06 MED ORDER — ISOSORBIDE MONONITRATE ER 60 MG PO TB24: 60.0000 mg | ORAL_TABLET | Freq: Every day | ORAL | Status: DC

## 2012-11-06 MED ORDER — CLONAZEPAM 2 MG PO TABS: 2.0000 mg | ORAL_TABLET | Freq: Three times a day (TID) | ORAL | Status: DC | PRN

## 2012-11-06 MED ORDER — ROSUVASTATIN CALCIUM 10 MG PO TABS: 10.0000 mg | ORAL_TABLET | Freq: Every day | ORAL | Status: DC

## 2012-11-06 MED ORDER — FLUTICASONE PROPIONATE 50 MCG/ACT NA SUSP: 2.0000 | Freq: Every day | NASAL | Status: DC

## 2012-11-06 NOTE — Patient Instructions (Signed)

## 2012-11-06 NOTE — Progress Notes (Signed)
Subjective:    Patient ID: Derek Chen, male    DOB: 1960/11/15, 52 y.o.   MRN: [id number]  HPI patient here today for follow up exam- He has multiple medical problems including diabetes- Fasting blood sugars averaging around 125 -130- trying to watch diet- Only complaint os of fatigue. Patient Active Problem List   Diagnosis Date Noted  . Type II or unspecified type diabetes mellitus without mention of complication, not stated as uncontrolled 11/06/2012  . BPH (benign prostatic hyperplasia) 11/06/2012  . 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 Palpitations 03/29/2012  . Dyspnea and respiratory abnormality 03/29/2012  . Acute myocardial infarction, subendocardial infarction 03/27/2012  . Heart disease 03/27/2012  . 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 Coronary atherosclerosis 03/27/2012  . Essential hypertension, benign   . Coronary atherosclerosis of native coronary artery   . HYPERLIPIDEMIA 04/19/2010   Outpatient Encounter Prescriptions as of 11/06/2012  Medication Sig Dispense Refill  . aspirin EC 81 MG tablet Take 81 mg by mouth daily.      . clonazePAM (KLONOPIN) 2 MG tablet Take 1 tablet (2 mg total) by mouth 3 (three) times daily as needed for anxiety.  90 tablet  0  . clopidogrel (PLAVIX) 75 MG tablet Take by mouth. Take 75 mg by mouth daily.      Marland Kitchen FLUoxetine (PROZAC) 40 MG capsule Take 1 capsule (40 mg total) by mouth daily.  90 capsule  3  . fluticasone (FLONASE) 50 MCG/ACT nasal spray Place 2 sprays into the nose daily.  16 g  6  . fluticasone (FLOVENT HFA) 110 MCG/ACT inhaler Inhale 2 puffs into the lungs 2 (two) times daily. Inhale 1 puff into the lungs 2 times daily.      . isosorbide mononitrate (IMDUR) 60 MG 24 hr tablet Take 1 tablet (60 mg total) by mouth daily.  90 tablet  1  . lisinopril (PRINIVIL,ZESTRIL) 20 MG tablet Take 1 tablet (20 mg total) by mouth daily.  30 tablet  5  . metFORMIN (GLUCOPHAGE) 1000 MG tablet Take 1 tablet (1,000 mg total)  by mouth 2 (two) times daily.  180 tablet  0  . metoprolol succinate (TOPROL-XL) 50 MG 24 hr tablet Take 1 tablet (50 mg total) by mouth daily. Take with or immediately following a meal.  30 tablet  2  . nitroGLYCERIN (NITROSTAT) 0.4 MG SL tablet Place 1 tablet (0.4 mg total) under the tongue every 5 (five) minutes as needed.  25 tablet  3  . pantoprazole (PROTONIX) 40 MG tablet Take 1 tablet (40 mg total) by mouth daily.  90 tablet  1  . rosuvastatin (CRESTOR) 10 MG tablet Take 10 mg by mouth daily.      . sitaGLIPtin (JANUVIA) 100 MG tablet Take 1 tablet (100 mg total) by mouth daily.  30 tablet  0  . tiotropium (SPIRIVA HANDIHALER) 18 MCG inhalation capsule Place 1 capsule into inhaler and inhale once. Place 1 capsule into inhaler and inh daily.      . traZODone (DESYREL) 50 MG tablet Take 1.5 tablets (75 mg total) by mouth daily as needed. For sleep  90 tablet  1   No facility-administered encounter medications on file as of 11/06/2012.       Review of Systems  Constitutional: Positive for fatigue. Negative for fever, activity change and appetite change.  HENT: Negative.   Respiratory: Positive for cough and shortness of breath (d/t COPD).   Cardiovascular: Negative.   Gastrointestinal: Negative.   Endocrine: Negative.  Genitourinary: Negative.   Musculoskeletal: Negative.   Allergic/Immunologic: Negative.   Neurological: Negative.   Hematological: Negative.   Psychiatric/Behavioral: Negative.        Objective:   Physical Exam  Constitutional: He is oriented to person, place, and time. He appears well-developed and well-nourished.  HENT:  Head: Normocephalic.  Right Ear: External ear normal.  Left Ear: External ear normal.  Nose: Nose normal.  Mouth/Throat: Oropharynx is clear and moist.  Eyes: EOM are normal. Pupils are equal, round, and reactive to light.  Neck: Normal range of motion. Neck supple. No thyromegaly present.  Cardiovascular: Normal rate, regular rhythm,  normal heart sounds and intact distal pulses.   No murmur heard. Pulmonary/Chest: Effort normal and breath sounds normal. He has no wheezes. He has no rales.  Abdominal: Soft. Bowel sounds are normal.  Genitourinary: Prostate normal and penis normal.  Refuses rectal exam  Musculoskeletal: Normal range of motion.  Neurological: He is alert and oriented to person, place, and time.  Skin: Skin is warm and dry.  Psychiatric: He has a normal mood and affect. His behavior is normal. Judgment and thought content normal.    BP 114/78  Pulse 63  Temp(Src) 96.8 F (36 C) (Oral)  Ht 5\' 11"  (1.803 m)  Wt 177 lb (80.287 kg)  BMI 24.7 kg/m2 Results for orders placed in visit on 11/06/12  POCT GLYCOSYLATED HEMOGLOBIN (HGB A1C)      Result Value Range   Hemoglobin A1C 7.8%           Assessment & Plan:   1. Diabetes type 2, uncontrolled   2. Hyperlipidemia   3. Hypertension   4. Unspecified vitamin D deficiency   5. Type II or unspecified type diabetes mellitus without mention of complication, not stated as uncontrolled   6. BPH (benign prostatic hyperplasia)   7. Fatigue   8. Diabetes   9. GAD (generalized anxiety disorder)   10. COPD bronchitis   11. GERD (gastroesophageal reflux disease)   12. CAD (coronary artery disease)    Orders Placed This Encounter  Procedures  . NMR, lipoprofile  . BMP8+EGFR  . Vitamin D 25 hydroxy  . Testosterone,Free and Total    Standing Status: Future     Number of Occurrences:      Standing Expiration Date: 11/13/2012  . PSA, total and free  . POCT glycosylated hemoglobin (Hb A1C)  . POCT UA - Microalbumin   Meds ordered this encounter  Medications  . glimepiride (AMARYL) 2 MG tablet    Sig: Take 1 tablet (2 mg total) by mouth daily before breakfast.    Dispense:  90 tablet    Refill:  1    Order Specific Question:  Supervising Provider    Answer:  Ernestina Penna [[id number]]  . tiotropium (SPIRIVA HANDIHALER) 18 MCG inhalation capsule    Sig:  Place 1 capsule (18 mcg total) into inhaler and inhale once. Place 1 capsule into inhaler and inh daily.    Dispense:  90 capsule    Refill:  1    Order Specific Question:  Supervising Provider    Answer:  Ernestina Penna [[id number]]  . metFORMIN (GLUCOPHAGE) 1000 MG tablet    Sig: Take 1 tablet (1,000 mg total) by mouth 2 (two) times daily.    Dispense:  180 tablet    Refill:  1    Order Specific Question:  Supervising Provider    Answer:  Lewayne Bunting [[id number]]  . rosuvastatin (CRESTOR) 10  MG tablet    Sig: Take 1 tablet (10 mg total) by mouth daily.    Dispense:  90 tablet    Refill:  1    Order Specific Question:  Supervising Provider    Answer:  Ernestina Penna [[id number]]  . sitaGLIPtin (JANUVIA) 100 MG tablet    Sig: Take 1 tablet (100 mg total) by mouth daily.    Dispense:  90 tablet    Refill:  1    Order Specific Question:  Supervising Provider    Answer:  Ernestina Penna [[id number]]  . clonazePAM (KLONOPIN) 2 MG tablet    Sig: Take 1 tablet (2 mg total) by mouth 3 (three) times daily as needed for anxiety.    Dispense:  90 tablet    Refill:  1    Order Specific Question:  Supervising Provider    Answer:  Ernestina Penna [[id number]]  . beclomethasone (QVAR) 80 MCG/ACT inhaler    Sig: Inhale 1 puff into the lungs as needed.    Dispense:  3 Inhaler    Refill:  1    Order Specific Question:  Supervising Provider    Answer:  Ernestina Penna [[id number]]  . lisinopril (PRINIVIL,ZESTRIL) 20 MG tablet    Sig: Take 1 tablet (20 mg total) by mouth daily.    Dispense:  90 tablet    Refill:  1    Order Specific Question:  Supervising Provider    Answer:  Ernestina Penna [[id number]]  . fluticasone (FLONASE) 50 MCG/ACT nasal spray    Sig: Place 2 sprays into the nose daily.    Dispense:  16 g    Refill:  6    Order Specific Question:  Supervising Provider    Answer:  Ernestina Penna [[id number]]  . pantoprazole (PROTONIX) 40 MG tablet    Sig: Take 1 tablet (40 mg total) by mouth daily.    Dispense:  90  tablet    Refill:  1    Order Specific Question:  Supervising Provider    Answer:  Olga Millers S [[id number]]  . FLUoxetine (PROZAC) 40 MG capsule    Sig: Take 1 capsule (40 mg total) by mouth daily.    Dispense:  90 capsule    Refill:  1    Order Specific Question:  Supervising Provider    Answer:  Ernestina Penna [[id number]]  . metoprolol succinate (TOPROL-XL) 50 MG 24 hr tablet    Sig: Take 1 tablet (50 mg total) by mouth daily. Take with or immediately following a meal.    Dispense:  90 tablet    Refill:  1    Order Specific Question:  Supervising Provider    Answer:  Ernestina Penna [[id number]]  . isosorbide mononitrate (IMDUR) 60 MG 24 hr tablet    Sig: Take 1 tablet (60 mg total) by mouth daily.    Dispense:  90 tablet    Refill:  1    Order Specific Question:  Supervising Provider    Answer:  Ernestina Penna [[id number]]  . traZODone (DESYREL) 50 MG tablet    Sig: Take 1.5 tablets (75 mg total) by mouth daily as needed. For sleep    Dispense:  90 tablet    Refill:  1    Order Specific Question:  Supervising Provider    Answer:  Ernestina Penna [[id number]]  . clopidogrel (PLAVIX) 75 MG tablet    Sig: Take 1 tablet (75 mg total) by mouth daily. Take  by mouth. Take 75 mg by mouth daily.    Dispense:  90 tablet    Refill:  1    Order Specific Question:  Supervising Provider    Answer:  Deborra Medina   Continue all meds Added amaryl to diabetes meds Low fat diet and exercsie Follow up in  84month  Mary-Margaret Oglesby, FNP

## 2012-11-09 ENCOUNTER — Encounter (HOSPITAL_COMMUNITY): Payer: 59

## 2012-11-09 ENCOUNTER — Other Ambulatory Visit (INDEPENDENT_AMBULATORY_CARE_PROVIDER_SITE_OTHER): Payer: 59

## 2012-11-09 ENCOUNTER — Other Ambulatory Visit: Payer: Self-pay | Admitting: Nurse Practitioner

## 2012-11-09 DIAGNOSIS — R5383 Other fatigue: Secondary | ICD-10-CM

## 2012-11-09 LAB — POCT UA - MICROALBUMIN: Microalbumin Ur, POC: 20 mg/L

## 2012-11-09 MED ORDER — SILDENAFIL CITRATE 100 MG PO TABS: 100.0000 mg | ORAL_TABLET | Freq: Every day | ORAL | Status: DC | PRN

## 2012-11-09 NOTE — Progress Notes (Signed)
Pt came in for labs only 

## 2012-11-10 LAB — BMP8+EGFR
BUN: 16 mg/dL (ref 6–24)
CO2: 21 mmol/L (ref 18–29)
Calcium: 9.7 mg/dL (ref 8.7–10.2)
Creatinine, Ser: 0.88 mg/dL (ref 0.76–1.27)
GFR calc non Af Amer: 99 mL/min/{1.73_m2} (ref >59)
Glucose: 193 mg/dL — ABNORMAL HIGH (ref 65–99)
Sodium: 139 mmol/L (ref 134–144)

## 2012-11-10 LAB — NMR, LIPOPROFILE
LDL Particle Number: 1635 nmol/L — ABNORMAL HIGH (ref <1000)
LDL Size: 20.1 nm — ABNORMAL LOW (ref >20.5)
LDLC SERPL CALC-MCNC: 60 mg/dL (ref <100)
LP-IR Score: 74 — ABNORMAL HIGH (ref <=45)

## 2012-11-10 LAB — VITAMIN D 25 HYDROXY (VIT D DEFICIENCY, FRACTURES): Vit D, 25-Hydroxy: 19.9 ng/mL — ABNORMAL LOW (ref 30.0–100.0)

## 2012-11-11 ENCOUNTER — Ambulatory Visit: Payer: 59 | Admitting: Cardiovascular Disease

## 2012-11-11 ENCOUNTER — Encounter (HOSPITAL_COMMUNITY): Payer: 59

## 2012-11-11 ENCOUNTER — Encounter: Payer: Self-pay | Admitting: Cardiovascular Disease

## 2012-11-12 LAB — PSA, TOTAL AND FREE

## 2012-11-16 ENCOUNTER — Encounter (HOSPITAL_COMMUNITY): Payer: 59

## 2012-11-18 ENCOUNTER — Encounter (HOSPITAL_COMMUNITY): Payer: 59

## 2012-11-23 ENCOUNTER — Encounter (HOSPITAL_COMMUNITY): Payer: 59

## 2012-11-24 ENCOUNTER — Telehealth: Payer: Self-pay | Admitting: Nurse Practitioner

## 2012-11-24 NOTE — Telephone Encounter (Signed)
Samples up front. Patient notified 

## 2012-11-25 ENCOUNTER — Encounter (HOSPITAL_COMMUNITY): Payer: 59

## 2012-11-30 ENCOUNTER — Encounter (HOSPITAL_COMMUNITY): Payer: 59

## 2012-12-02 ENCOUNTER — Encounter (HOSPITAL_COMMUNITY): Payer: 59

## 2012-12-04 ENCOUNTER — Telehealth: Payer: Self-pay | Admitting: Nurse Practitioner

## 2012-12-07 ENCOUNTER — Other Ambulatory Visit: Payer: Self-pay | Admitting: *Deleted

## 2012-12-07 ENCOUNTER — Encounter (HOSPITAL_COMMUNITY): Payer: 59

## 2012-12-07 DIAGNOSIS — E785 Hyperlipidemia, unspecified: Secondary | ICD-10-CM

## 2012-12-07 MED ORDER — SILDENAFIL CITRATE 100 MG PO TABS: 100.0000 mg | ORAL_TABLET | Freq: Every day | ORAL | Status: DC | PRN

## 2012-12-07 NOTE — Telephone Encounter (Signed)
LAST OV 11/06/12. MED NOT LISTED WITH LAST OV BUT [facility] HAS A PRINTED RX FOR 11/09/12. PLEASE REVIEW. THANKS.

## 2012-12-07 NOTE — Telephone Encounter (Signed)
REFILL REQUEST SENT TO [facility] 

## 2012-12-09 ENCOUNTER — Encounter (HOSPITAL_COMMUNITY): Payer: 59

## 2012-12-14 ENCOUNTER — Encounter (HOSPITAL_COMMUNITY): Payer: 59

## 2012-12-16 ENCOUNTER — Encounter (HOSPITAL_COMMUNITY): Payer: 59

## 2012-12-17 NOTE — Telephone Encounter (Signed)
Ok for qvar example- rx sent to pharmacy for viagra

## 2012-12-18 ENCOUNTER — Ambulatory Visit: Payer: 59 | Admitting: Nurse Practitioner

## 2012-12-21 ENCOUNTER — Encounter (HOSPITAL_COMMUNITY): Payer: 59

## 2012-12-23 ENCOUNTER — Encounter (HOSPITAL_COMMUNITY): Payer: 59

## 2012-12-24 ENCOUNTER — Encounter: Payer: Self-pay | Admitting: Nurse Practitioner

## 2012-12-24 ENCOUNTER — Ambulatory Visit (INDEPENDENT_AMBULATORY_CARE_PROVIDER_SITE_OTHER): Payer: 59 | Admitting: Nurse Practitioner

## 2012-12-24 VITALS — BP 107/78 | HR 78 | Temp 97.4°F | Ht 71.0 in | Wt 173.0 lb

## 2012-12-24 DIAGNOSIS — Z09 Encounter for follow-up examination after completed treatment for conditions other than malignant neoplasm: Secondary | ICD-10-CM

## 2012-12-24 DIAGNOSIS — F411 Generalized anxiety disorder: Secondary | ICD-10-CM

## 2012-12-24 DIAGNOSIS — E1165 Type 2 diabetes mellitus with hyperglycemia: Secondary | ICD-10-CM

## 2012-12-24 DIAGNOSIS — I10 Essential (primary) hypertension: Secondary | ICD-10-CM

## 2012-12-24 DIAGNOSIS — I2510 Atherosclerotic heart disease of native coronary artery without angina pectoris: Secondary | ICD-10-CM

## 2012-12-24 DIAGNOSIS — J449 Chronic obstructive pulmonary disease, unspecified: Secondary | ICD-10-CM

## 2012-12-24 DIAGNOSIS — J4489 Other specified chronic obstructive pulmonary disease: Secondary | ICD-10-CM

## 2012-12-24 DIAGNOSIS — IMO0001 Reserved for inherently not codable concepts without codable children: Secondary | ICD-10-CM

## 2012-12-24 DIAGNOSIS — K219 Gastro-esophageal reflux disease without esophagitis: Secondary | ICD-10-CM

## 2012-12-24 MED ORDER — GLIMEPIRIDE 2 MG PO TABS: 2.0000 mg | ORAL_TABLET | Freq: Every day | ORAL | Status: DC

## 2012-12-24 MED ORDER — METOPROLOL SUCCINATE ER 50 MG PO TB24: 50.0000 mg | ORAL_TABLET | Freq: Every day | ORAL | Status: DC

## 2012-12-24 MED ORDER — FLUTICASONE PROPIONATE 50 MCG/ACT NA SUSP: 2.0000 | Freq: Every day | NASAL | Status: DC

## 2012-12-24 MED ORDER — TRAZODONE HCL 50 MG PO TABS: 75.0000 mg | ORAL_TABLET | Freq: Every day | ORAL | Status: DC | PRN

## 2012-12-24 MED ORDER — CLONAZEPAM 2 MG PO TABS: 2.0000 mg | ORAL_TABLET | Freq: Three times a day (TID) | ORAL | Status: DC | PRN

## 2012-12-24 MED ORDER — METFORMIN HCL 1000 MG PO TABS: 1000.0000 mg | ORAL_TABLET | Freq: Two times a day (BID) | ORAL | Status: DC

## 2012-12-24 MED ORDER — CLOPIDOGREL BISULFATE 75 MG PO TABS: 75.0000 mg | ORAL_TABLET | Freq: Every day | ORAL | Status: DC

## 2012-12-24 MED ORDER — PANTOPRAZOLE SODIUM 40 MG PO TBEC: 40.0000 mg | DELAYED_RELEASE_TABLET | Freq: Every day | ORAL | Status: DC

## 2012-12-24 MED ORDER — ISOSORBIDE MONONITRATE ER 60 MG PO TB24: 60.0000 mg | ORAL_TABLET | Freq: Every day | ORAL | Status: DC

## 2012-12-24 MED ORDER — PROMETHAZINE HCL 25 MG PO TABS: 25.0000 mg | ORAL_TABLET | Freq: Four times a day (QID) | ORAL | Status: DC | PRN

## 2012-12-24 MED ORDER — LISINOPRIL 20 MG PO TABS: 20.0000 mg | ORAL_TABLET | Freq: Every day | ORAL | Status: DC

## 2012-12-24 MED ORDER — FLUOXETINE HCL 40 MG PO CAPS: 40.0000 mg | ORAL_CAPSULE | Freq: Every day | ORAL | Status: DC

## 2012-12-24 NOTE — Patient Instructions (Signed)
Dehydration, Adult Dehydration is when you lose more fluids from the body than you take in. Vital organs like the kidneys, brain, and heart cannot function without a proper amount of fluids and salt. Any loss of fluids from the body can cause dehydration.  CAUSES   Vomiting.  Diarrhea.  Excessive sweating.  Excessive urine output.  Fever. SYMPTOMS  Mild dehydration  Thirst.  Dry lips.  Slightly dry mouth. Moderate dehydration  Very dry mouth.  Sunken eyes.  Skin does not bounce back quickly when lightly pinched and released.  Dark urine and decreased urine production.  Decreased tear production.  Headache. Severe dehydration  Very dry mouth.  Extreme thirst.  Rapid, weak pulse (more than 100 beats per minute at rest).  Cold hands and feet.  Not able to sweat in spite of heat and temperature.  Rapid breathing.  Blue lips.  Confusion and lethargy.  Difficulty being awakened.  Minimal urine production.  No tears. DIAGNOSIS  Your caregiver will diagnose dehydration based on your symptoms and your exam. Blood and urine tests will help confirm the diagnosis. The diagnostic evaluation should also identify the cause of dehydration. TREATMENT  Treatment of mild or moderate dehydration can often be done at home by increasing the amount of fluids that you drink. It is best to drink small amounts of fluid more often. Drinking too much at one time can make vomiting worse. Refer to the home care instructions below. Severe dehydration needs to be treated at the hospital where you will probably be given intravenous (IV) fluids that contain water and electrolytes. HOME CARE INSTRUCTIONS   Ask your caregiver about specific rehydration instructions.  Drink enough fluids to keep your urine clear or pale yellow.  Drink small amounts frequently if you have nausea and vomiting.  Eat as you normally do.  Avoid:  Foods or drinks high in sugar.  Carbonated  drinks.  Juice.  Extremely hot or cold fluids.  Drinks with caffeine.  Fatty, greasy foods.  Alcohol.  Tobacco.  Overeating.  Gelatin desserts.  Wash your hands well to avoid spreading bacteria and viruses.  Only take over-the-counter or prescription medicines for pain, discomfort, or fever as directed by your caregiver.  Ask your caregiver if you should continue all prescribed and over-the-counter medicines.  Keep all follow-up appointments with your caregiver. SEEK MEDICAL CARE IF:  You have abdominal pain and it increases or stays in one area (localizes).  You have a rash, stiff neck, or severe headache.  You are irritable, sleepy, or difficult to awaken.  You are weak, dizzy, or extremely thirsty. SEEK IMMEDIATE MEDICAL CARE IF:   You are unable to keep fluids down or you get worse despite treatment.  You have frequent episodes of vomiting or diarrhea.  You have blood or green matter (bile) in your vomit.  You have blood in your stool or your stool looks black and tarry.  You have not urinated in 6 to 8 hours, or you have only urinated a small amount of very dark urine.  You have a fever.  You faint. MAKE SURE YOU:   Understand these instructions.  Will watch your condition.  Will get help right away if you are not doing well or get worse. Document Released: 03/18/2005 Document Revised: 06/10/2011 Document Reviewed: 11/05/2010 [employer] Patient Information 2014 [employer].  

## 2012-12-24 NOTE — Progress Notes (Signed)
Subjective:    Patient ID: HAL NORRINGTON, male    DOB: March 27, 1961, 52 y.o.   MRN: [id number]  HPI  Patient in today for hospital follow up- He was their for severe dehydration and gasroenteritis- He was there for 3 days getting IV fluids- Doing much better. He saw Pulmonologist yesterday and dx with lung infection and was put on antibiotic and prednisone- feels a little weak but overall ok. Patient Active Problem List   Diagnosis Date Noted  . Type II or unspecified type diabetes mellitus without mention of complication, not stated as uncontrolled 11/06/2012  . BPH (benign prostatic hyperplasia) 11/06/2012  . 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 Palpitations 03/29/2012  . Dyspnea and respiratory abnormality 03/29/2012  . Acute myocardial infarction, subendocardial infarction 03/27/2012  . Heart disease 03/27/2012  . 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 Coronary atherosclerosis 03/27/2012  . Essential hypertension, benign   . Coronary atherosclerosis of native coronary artery   . HYPERLIPIDEMIA 04/19/2010   Outpatient Encounter Prescriptions as of 12/24/2012  Medication Sig Dispense Refill  . albuterol (PROVENTIL) (2.5 MG/3ML) 0.083% nebulizer solution       . aspirin EC 81 MG tablet Take 81 mg by mouth daily.      . beclomethasone (QVAR) 80 MCG/ACT inhaler Inhale 1 puff into the lungs as needed.  3 Inhaler  1  . clonazePAM (KLONOPIN) 2 MG tablet Take 1 tablet (2 mg total) by mouth 3 (three) times daily as needed for anxiety.  90 tablet  1  . clopidogrel (PLAVIX) 75 MG tablet Take 1 tablet (75 mg total) by mouth daily. Take by mouth. Take 75 mg by mouth daily.  90 tablet  1  . FLUoxetine (PROZAC) 40 MG capsule Take 1 capsule (40 mg total) by mouth daily.  90 capsule  1  . fluticasone (FLONASE) 50 MCG/ACT nasal spray Place 2 sprays into the nose daily.  16 g  6  . glimepiride (AMARYL) 2 MG tablet Take 1 tablet (2 mg total) by mouth daily before breakfast.  90 tablet   1  . isosorbide mononitrate (IMDUR) 60 MG 24 hr tablet Take 1 tablet (60 mg total) by mouth daily.  90 tablet  1  . lisinopril (PRINIVIL,ZESTRIL) 20 MG tablet Take 1 tablet (20 mg total) by mouth daily.  90 tablet  1  . metFORMIN (GLUCOPHAGE) 1000 MG tablet Take 1 tablet (1,000 mg total) by mouth 2 (two) times daily.  180 tablet  1  . metoprolol succinate (TOPROL-XL) 50 MG 24 hr tablet Take 1 tablet (50 mg total) by mouth daily. Take with or immediately following a meal.  90 tablet  1  . nitroGLYCERIN (NITROSTAT) 0.4 MG SL tablet Place 1 tablet (0.4 mg total) under the tongue every 5 (five) minutes as needed.  25 tablet  3  . pantoprazole (PROTONIX) 40 MG tablet Take 1 tablet (40 mg total) by mouth daily.  90 tablet  1  . potassium chloride SA (K-DUR,KLOR-CON) 20 MEQ tablet       . predniSONE (DELTASONE) 10 MG tablet       . rosuvastatin (CRESTOR) 10 MG tablet Take 1 tablet (10 mg total) by mouth daily.  90 tablet  1  . sildenafil (VIAGRA) 100 MG tablet Take 1 tablet (100 mg total) by mouth daily as needed for erectile dysfunction.  3 tablet  1  . sitaGLIPtin (JANUVIA) 100 MG tablet Take 1 tablet (100 mg total) by mouth daily.  90 tablet  1  .  sucralfate (CARAFATE) 1 G tablet       . tiotropium (SPIRIVA HANDIHALER) 18 MCG inhalation capsule Place 1 capsule (18 mcg total) into inhaler and inhale once. Place 1 capsule into inhaler and inh daily.  90 capsule  1  . traZODone (DESYREL) 50 MG tablet Take 1.5 tablets (75 mg total) by mouth daily as needed. For sleep  90 tablet  1  . [DISCONTINUED] clonazePAM (KLONOPIN) 2 MG tablet Take 1 tablet (2 mg total) by mouth 3 (three) times daily as needed for anxiety.  90 tablet  1  . [DISCONTINUED] clopidogrel (PLAVIX) 75 MG tablet Take 1 tablet (75 mg total) by mouth daily. Take by mouth. Take 75 mg by mouth daily.  90 tablet  1  . [DISCONTINUED] FLUoxetine (PROZAC) 40 MG capsule Take 1 capsule (40 mg total) by mouth daily.  90 capsule  1  . [DISCONTINUED]  fluticasone (FLONASE) 50 MCG/ACT nasal spray Place 2 sprays into the nose daily.  16 g  6  . [DISCONTINUED] glimepiride (AMARYL) 2 MG tablet Take 1 tablet (2 mg total) by mouth daily before breakfast.  90 tablet  1  . [DISCONTINUED] isosorbide mononitrate (IMDUR) 60 MG 24 hr tablet Take 1 tablet (60 mg total) by mouth daily.  90 tablet  1  . [DISCONTINUED] lisinopril (PRINIVIL,ZESTRIL) 20 MG tablet Take 1 tablet (20 mg total) by mouth daily.  90 tablet  1  . [DISCONTINUED] metFORMIN (GLUCOPHAGE) 1000 MG tablet Take 1 tablet (1,000 mg total) by mouth 2 (two) times daily.  180 tablet  1  . [DISCONTINUED] metoprolol succinate (TOPROL-XL) 50 MG 24 hr tablet Take 1 tablet (50 mg total) by mouth daily. Take with or immediately following a meal.  90 tablet  1  . [DISCONTINUED] pantoprazole (PROTONIX) 40 MG tablet Take 1 tablet (40 mg total) by mouth daily.  90 tablet  1  . [DISCONTINUED] promethazine (PHENERGAN) 25 MG tablet       . [DISCONTINUED] traZODone (DESYREL) 50 MG tablet Take 1.5 tablets (75 mg total) by mouth daily as needed. For sleep  90 tablet  1  . promethazine (PHENERGAN) 25 MG tablet Take 1 tablet (25 mg total) by mouth every 6 (six) hours as needed for nausea.  20 tablet  1  . [DISCONTINUED] doxycycline (VIBRA-TABS) 100 MG tablet        No facility-administered encounter medications on file as of 12/24/2012.      Review of Systems  Unable to perform ROS Constitutional: Negative for fever and chills.  HENT: Negative.   Eyes: Negative.   Respiratory: Negative.   Cardiovascular: Negative.   Gastrointestinal: Positive for diarrhea and constipation.  Genitourinary: Negative.   Musculoskeletal: Negative.   Skin: Negative.   Neurological: Negative.   All other systems reviewed and are negative.       Objective:   Physical Exam  Constitutional: He is oriented to person, place, and time. He appears well-developed and well-nourished.  HENT:  Head: Normocephalic.  Eyes: EOM are  normal. Pupils are equal, round, and reactive to light.  Neck: Normal range of motion. Neck supple.  Cardiovascular: Normal rate and normal heart sounds.   Pulmonary/Chest: Effort normal and breath sounds normal.  Abdominal: Soft. Bowel sounds are normal.  Lymphadenopathy:    He has no cervical adenopathy.  Neurological: He is alert and oriented to person, place, and time.  Skin: Skin is warm and dry.  Psychiatric: He has a normal mood and affect. His behavior is normal. Judgment and  thought content normal.     BP 107/78  Pulse 78  Temp(Src) 97.4 F (36.3 C) (Oral)  Ht 5\' 11"  (1.803 m)  Wt 173 lb (78.472 kg)  BMI 24.14 kg/m2      Assessment & Plan:   1. GERD (gastroesophageal reflux disease)   2. GAD (generalized anxiety disorder)   3. Diabetes type 2, uncontrolled   4. Hypertension   5. CAD (coronary artery disease)   6. COPD bronchitis   7. Hospital discharge follow-up    Orders Placed This Encounter  Procedures  . CMP14+EGFR   Meds ordered this encounter  Medications  . albuterol (PROVENTIL) (2.5 MG/3ML) 0.083% nebulizer solution    Sig:   . DISCONTD: doxycycline (VIBRA-TABS) 100 MG tablet    Sig:   . potassium chloride SA (K-DUR,KLOR-CON) 20 MEQ tablet    Sig:   . predniSONE (DELTASONE) 10 MG tablet    Sig:   . DISCONTD: promethazine (PHENERGAN) 25 MG tablet    Sig:   . sucralfate (CARAFATE) 1 G tablet    Sig:   . traZODone (DESYREL) 50 MG tablet    Sig: Take 1.5 tablets (75 mg total) by mouth daily as needed. For sleep    Dispense:  90 tablet    Refill:  1    Order Specific Question:  Supervising Provider    Answer:  Ernestina Penna [[id number]]  . promethazine (PHENERGAN) 25 MG tablet    Sig: Take 1 tablet (25 mg total) by mouth every 6 (six) hours as needed for nausea.    Dispense:  20 tablet    Refill:  1  . pantoprazole (PROTONIX) 40 MG tablet    Sig: Take 1 tablet (40 mg total) by mouth daily.    Dispense:  90 tablet    Refill:  1    Order Specific  Question:  Supervising Provider    Answer:  Olga Millers S [[id number]]  . metoprolol succinate (TOPROL-XL) 50 MG 24 hr tablet    Sig: Take 1 tablet (50 mg total) by mouth daily. Take with or immediately following a meal.    Dispense:  90 tablet    Refill:  1    Order Specific Question:  Supervising Provider    Answer:  Ernestina Penna [[id number]]  . metFORMIN (GLUCOPHAGE) 1000 MG tablet    Sig: Take 1 tablet (1,000 mg total) by mouth 2 (two) times daily.    Dispense:  180 tablet    Refill:  1    Order Specific Question:  Supervising Provider    Answer:  Lewayne Bunting [[id number]]  . lisinopril (PRINIVIL,ZESTRIL) 20 MG tablet    Sig: Take 1 tablet (20 mg total) by mouth daily.    Dispense:  90 tablet    Refill:  1    Order Specific Question:  Supervising Provider    Answer:  Ernestina Penna [[id number]]  . isosorbide mononitrate (IMDUR) 60 MG 24 hr tablet    Sig: Take 1 tablet (60 mg total) by mouth daily.    Dispense:  90 tablet    Refill:  1    Order Specific Question:  Supervising Provider    Answer:  Ernestina Penna [[id number]]  . glimepiride (AMARYL) 2 MG tablet    Sig: Take 1 tablet (2 mg total) by mouth daily before breakfast.    Dispense:  90 tablet    Refill:  1    Order Specific Question:  Supervising Provider    Answer:  Rudi Heap W [[id number]]  . fluticasone (FLONASE) 50 MCG/ACT nasal spray    Sig: Place 2 sprays into the nose daily.    Dispense:  16 g    Refill:  6    Order Specific Question:  Supervising Provider    Answer:  Ernestina Penna [[id number]]  . FLUoxetine (PROZAC) 40 MG capsule    Sig: Take 1 capsule (40 mg total) by mouth daily.    Dispense:  90 capsule    Refill:  1    Order Specific Question:  Supervising Provider    Answer:  Ernestina Penna [[id number]]  . clopidogrel (PLAVIX) 75 MG tablet    Sig: Take 1 tablet (75 mg total) by mouth daily. Take by mouth. Take 75 mg by mouth daily.    Dispense:  90 tablet    Refill:  1    Order Specific Question:  Supervising Provider     Answer:  Ernestina Penna [[id number]]  . clonazePAM (KLONOPIN) 2 MG tablet    Sig: Take 1 tablet (2 mg total) by mouth 3 (three) times daily as needed for anxiety.    Dispense:  90 tablet    Refill:  1    Order Specific Question:  Supervising Provider    Answer:  Ernestina Penna [[id number]]   Labs pending Force fluids Rest RTO prn  Mary-Margaret Daphine Deutscher, FNP

## 2012-12-25 LAB — CMP14+EGFR
ALT: 28 IU/L (ref 0–44)
BUN: 18 mg/dL (ref 6–24)
Calcium: 9.2 mg/dL (ref 8.7–10.2)
Chloride: 99 mmol/L (ref 97–108)
Creatinine, Ser: 0.86 mg/dL (ref 0.76–1.27)
GFR calc Af Amer: 115 mL/min/{1.73_m2} (ref >59)
Glucose: 162 mg/dL — ABNORMAL HIGH (ref 65–99)
Potassium: 5.7 mmol/L — ABNORMAL HIGH (ref 3.5–5.2)
Total Bilirubin: 0.3 mg/dL (ref 0.0–1.2)
Total Protein: 6.9 g/dL (ref 6.0–8.5)

## 2012-12-28 ENCOUNTER — Encounter (HOSPITAL_COMMUNITY): Payer: 59

## 2012-12-30 ENCOUNTER — Encounter (HOSPITAL_COMMUNITY): Payer: 59

## 2013-01-04 ENCOUNTER — Encounter (HOSPITAL_COMMUNITY): Payer: 59

## 2013-01-06 ENCOUNTER — Encounter (HOSPITAL_COMMUNITY): Payer: 59

## 2013-01-08 ENCOUNTER — Ambulatory Visit: Payer: 59 | Admitting: Nurse Practitioner

## 2013-01-11 ENCOUNTER — Encounter (HOSPITAL_COMMUNITY): Payer: 59

## 2013-01-13 ENCOUNTER — Encounter (HOSPITAL_COMMUNITY): Payer: 59

## 2013-01-18 ENCOUNTER — Encounter (HOSPITAL_COMMUNITY): Payer: 59

## 2013-01-18 ENCOUNTER — Telehealth: Payer: Self-pay | Admitting: Nurse Practitioner

## 2013-01-19 ENCOUNTER — Encounter: Payer: Self-pay | Admitting: Nurse Practitioner

## 2013-01-19 NOTE — Telephone Encounter (Signed)
The letter you wrote on him for work he needs you to write another one stating his pulmonary conditions and that he is using a cane now.

## 2013-01-19 NOTE — Telephone Encounter (Signed)
Letter ready for pick up- [facility] samples are fine to give

## 2013-01-19 NOTE — Telephone Encounter (Signed)
paTIENT AWARE UP FRONT

## 2013-01-20 ENCOUNTER — Encounter (HOSPITAL_COMMUNITY): Payer: 59

## 2013-01-25 ENCOUNTER — Telehealth: Payer: Self-pay | Admitting: Nurse Practitioner

## 2013-01-25 ENCOUNTER — Encounter (HOSPITAL_COMMUNITY): Payer: 59

## 2013-01-27 ENCOUNTER — Encounter (HOSPITAL_COMMUNITY): Payer: 59

## 2013-02-01 ENCOUNTER — Encounter (HOSPITAL_COMMUNITY): Payer: 59

## 2013-02-03 ENCOUNTER — Encounter (HOSPITAL_COMMUNITY): Payer: 59

## 2013-02-15 ENCOUNTER — Encounter: Payer: Self-pay | Admitting: Nurse Practitioner

## 2013-02-15 ENCOUNTER — Ambulatory Visit (INDEPENDENT_AMBULATORY_CARE_PROVIDER_SITE_OTHER): Payer: 59 | Admitting: Nurse Practitioner

## 2013-02-15 VITALS — BP 139/87 | HR 68 | Temp 97.8°F | Ht 71.0 in | Wt 178.0 lb

## 2013-02-15 DIAGNOSIS — E119 Type 2 diabetes mellitus without complications: Secondary | ICD-10-CM

## 2013-02-15 DIAGNOSIS — E785 Hyperlipidemia, unspecified: Secondary | ICD-10-CM

## 2013-02-15 DIAGNOSIS — E1165 Type 2 diabetes mellitus with hyperglycemia: Secondary | ICD-10-CM

## 2013-02-15 DIAGNOSIS — K219 Gastro-esophageal reflux disease without esophagitis: Secondary | ICD-10-CM

## 2013-02-15 DIAGNOSIS — I2510 Atherosclerotic heart disease of native coronary artery without angina pectoris: Secondary | ICD-10-CM

## 2013-02-15 DIAGNOSIS — F411 Generalized anxiety disorder: Secondary | ICD-10-CM

## 2013-02-15 DIAGNOSIS — I10 Essential (primary) hypertension: Secondary | ICD-10-CM

## 2013-02-15 DIAGNOSIS — Z23 Encounter for immunization: Secondary | ICD-10-CM

## 2013-02-15 LAB — POCT GLYCOSYLATED HEMOGLOBIN (HGB A1C): Hemoglobin A1C: 6.1

## 2013-02-15 MED ORDER — METOPROLOL SUCCINATE ER 50 MG PO TB24: 50.0000 mg | ORAL_TABLET | Freq: Every day | ORAL | Status: DC

## 2013-02-15 MED ORDER — ROSUVASTATIN CALCIUM 10 MG PO TABS: 10.0000 mg | ORAL_TABLET | Freq: Every day | ORAL | Status: DC

## 2013-02-15 MED ORDER — SITAGLIPTIN PHOSPHATE 100 MG PO TABS: 100.0000 mg | ORAL_TABLET | Freq: Every day | ORAL | Status: DC

## 2013-02-15 MED ORDER — METFORMIN HCL 1000 MG PO TABS: 1000.0000 mg | ORAL_TABLET | Freq: Two times a day (BID) | ORAL | Status: DC

## 2013-02-15 MED ORDER — TRAZODONE HCL 50 MG PO TABS: 75.0000 mg | ORAL_TABLET | Freq: Every day | ORAL | Status: DC | PRN

## 2013-02-15 MED ORDER — FLUOXETINE HCL 40 MG PO CAPS: 40.0000 mg | ORAL_CAPSULE | Freq: Every day | ORAL | Status: DC

## 2013-02-15 MED ORDER — GLIMEPIRIDE 2 MG PO TABS: 2.0000 mg | ORAL_TABLET | Freq: Every day | ORAL | Status: DC

## 2013-02-15 MED ORDER — LISINOPRIL 20 MG PO TABS: 20.0000 mg | ORAL_TABLET | Freq: Every day | ORAL | Status: DC

## 2013-02-15 MED ORDER — PANTOPRAZOLE SODIUM 40 MG PO TBEC: 40.0000 mg | DELAYED_RELEASE_TABLET | Freq: Every day | ORAL | Status: DC

## 2013-02-15 MED ORDER — CLONAZEPAM 2 MG PO TABS: 2.0000 mg | ORAL_TABLET | Freq: Three times a day (TID) | ORAL | Status: DC | PRN

## 2013-02-15 MED ORDER — ISOSORBIDE MONONITRATE ER 60 MG PO TB24: 60.0000 mg | ORAL_TABLET | Freq: Every day | ORAL | Status: DC

## 2013-02-15 MED ORDER — CLOPIDOGREL BISULFATE 75 MG PO TABS: 75.0000 mg | ORAL_TABLET | Freq: Every day | ORAL | Status: DC

## 2013-02-15 NOTE — Patient Instructions (Signed)

## 2013-02-15 NOTE — Progress Notes (Signed)
Subjective:    Patient ID: Derek Chen, male    DOB: [birth year], 52 y.o.   MRN: [id number]  HPI   Patient in today for hospital follow up- He was their for severe dehydration and gasroenteritis- He was there for 3 days getting IV fluids- Doing much better. He saw Pulmonologist yesterday and dx with lung infection and was put on antibiotic and prednisone- feels a little weak but overall ok. Patient Active Problem List   Diagnosis Date Noted  . Type II or unspecified type diabetes mellitus without mention of complication, not stated as uncontrolled 11/06/2012  . BPH (benign prostatic hyperplasia) 11/06/2012  . 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 Palpitations 03/29/2012  . Dyspnea and respiratory abnormality 03/29/2012  . Acute myocardial infarction, subendocardial infarction 03/27/2012  . Heart disease 03/27/2012  . 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 Coronary atherosclerosis 03/27/2012  . Essential hypertension, benign   . Coronary atherosclerosis of native coronary artery   . HYPERLIPIDEMIA 04/19/2010   Outpatient Encounter Prescriptions as of 02/15/2013  Medication Sig  . albuterol (PROVENTIL) (2.5 MG/3ML) 0.083% nebulizer solution   . aspirin EC 81 MG tablet Take 81 mg by mouth daily.  . beclomethasone (QVAR) 80 MCG/ACT inhaler Inhale 1 puff into the lungs as needed.  . clonazePAM (KLONOPIN) 2 MG tablet Take 1 tablet (2 mg total) by mouth 3 (three) times daily as needed for anxiety.  . clopidogrel (PLAVIX) 75 MG tablet Take 1 tablet (75 mg total) by mouth daily. Take by mouth. Take 75 mg by mouth daily.  Marland Kitchen FLUoxetine (PROZAC) 40 MG capsule Take 1 capsule (40 mg total) by mouth daily.  . fluticasone (FLONASE) 50 MCG/ACT nasal spray Place 2 sprays into the nose daily.  Marland Kitchen glimepiride (AMARYL) 2 MG tablet Take 1 tablet (2 mg total) by mouth daily before breakfast.  . isosorbide mononitrate (IMDUR) 60 MG 24 hr tablet Take 1 tablet (60 mg total) by mouth daily.   Marland Kitchen lisinopril (PRINIVIL,ZESTRIL) 20 MG tablet Take 1 tablet (20 mg total) by mouth daily.  . metFORMIN (GLUCOPHAGE) 1000 MG tablet Take 1 tablet (1,000 mg total) by mouth 2 (two) times daily.  . metoprolol succinate (TOPROL-XL) 50 MG 24 hr tablet Take 1 tablet (50 mg total) by mouth daily. Take with or immediately following a meal.  . nitroGLYCERIN (NITROSTAT) 0.4 MG SL tablet Place 1 tablet (0.4 mg total) under the tongue every 5 (five) minutes as needed.  . pantoprazole (PROTONIX) 40 MG tablet Take 1 tablet (40 mg total) by mouth daily.  . rosuvastatin (CRESTOR) 10 MG tablet Take 1 tablet (10 mg total) by mouth daily.  . sildenafil (VIAGRA) 100 MG tablet Take 1 tablet (100 mg total) by mouth daily as needed for erectile dysfunction.  . sitaGLIPtin (JANUVIA) 100 MG tablet Take 1 tablet (100 mg total) by mouth daily.  Marland Kitchen tiotropium (SPIRIVA HANDIHALER) 18 MCG inhalation capsule Place 1 capsule (18 mcg total) into inhaler and inhale once. Place 1 capsule into inhaler and inh daily.  . traZODone (DESYREL) 50 MG tablet Take 1.5 tablets (75 mg total) by mouth daily as needed. For sleep  . [DISCONTINUED] sucralfate (CARAFATE) 1 G tablet   . HYDROcodone-homatropine (HYCODAN) 5-1.5 MG/5ML syrup   . [DISCONTINUED] potassium chloride SA (K-DUR,KLOR-CON) 20 MEQ tablet   . [DISCONTINUED] predniSONE (DELTASONE) 10 MG tablet   . [DISCONTINUED] promethazine (PHENERGAN) 25 MG tablet Take 1 tablet (25 mg total) by mouth every 6 (six) hours as needed for nausea.  Review of Systems  Unable to perform ROS Constitutional: Negative for fever and chills.  HENT: Negative.   Eyes: Negative.   Respiratory: Negative.   Cardiovascular: Negative.   Gastrointestinal: Positive for diarrhea and constipation.  Genitourinary: Negative.   Musculoskeletal: Negative.   Skin: Negative.   Neurological: Negative.   All other systems reviewed and are negative.       Objective:   Physical Exam  Constitutional: He  is oriented to person, place, and time. He appears well-developed and well-nourished.  HENT:  Head: Normocephalic.  Eyes: EOM are normal. Pupils are equal, round, and reactive to light.  Neck: Normal range of motion. Neck supple.  Cardiovascular: Normal rate and normal heart sounds.   Pulmonary/Chest: Effort normal and breath sounds normal.  Abdominal: Soft. Bowel sounds are normal.  Lymphadenopathy:    He has no cervical adenopathy.  Neurological: He is alert and oriented to person, place, and time.  Skin: Skin is warm and dry.  Psychiatric: He has a normal mood and affect. His behavior is normal. Judgment and thought content normal.     BP 139/87  Pulse 68  Temp(Src) 97.8 F (36.6 C) (Oral)  Ht 5\' 11"  (1.803 m)  Wt 178 lb (80.74 kg)  BMI 24.84 kg/m2  Results for orders placed in visit on 02/15/13  POCT GLYCOSYLATED HEMOGLOBIN (HGB A1C)      Result Value Range   Hemoglobin A1C 6.1%         Assessment & Plan:   1. HYPERLIPIDEMIA   2. Essential hypertension, benign   3. Type II or unspecified type diabetes mellitus without mention of complication, not stated as uncontrolled   4. CAD (coronary artery disease)   5. GAD (generalized anxiety disorder)   6. GERD (gastroesophageal reflux disease)   7. Hypertension   8. Diabetes type 2, uncontrolled   9. Hyperlipidemia    Orders Placed This Encounter  Procedures  . CMP14+EGFR  . NMR, lipoprofile  . POCT glycosylated hemoglobin (Hb A1C)   Meds ordered this encounter  Medications  . HYDROcodone-homatropine (HYCODAN) 5-1.5 MG/5ML syrup    Sig:   . clopidogrel (PLAVIX) 75 MG tablet    Sig: Take 1 tablet (75 mg total) by mouth daily. Take by mouth. Take 75 mg by mouth daily.    Dispense:  90 tablet    Refill:  1    Order Specific Question:  Supervising Provider    Answer:  Ernestina Penna [[id number]]  . traZODone (DESYREL) 50 MG tablet    Sig: Take 1.5 tablets (75 mg total) by mouth daily as needed. For sleep    Dispense:   90 tablet    Refill:  1    Order Specific Question:  Supervising Provider    Answer:  Ernestina Penna [[id number]]  . isosorbide mononitrate (IMDUR) 60 MG 24 hr tablet    Sig: Take 1 tablet (60 mg total) by mouth daily.    Dispense:  90 tablet    Refill:  1    Order Specific Question:  Supervising Provider    Answer:  Ernestina Penna [[id number]]  . metoprolol succinate (TOPROL-XL) 50 MG 24 hr tablet    Sig: Take 1 tablet (50 mg total) by mouth daily. Take with or immediately following a meal.    Dispense:  90 tablet    Refill:  1    Order Specific Question:  Supervising Provider    Answer:  Ernestina Penna [[id number]]  . pantoprazole (PROTONIX) 40 MG  tablet    Sig: Take 1 tablet (40 mg total) by mouth daily.    Dispense:  90 tablet    Refill:  1    Order Specific Question:  Supervising Provider    Answer:  Olga Millers S [[id number]]  . FLUoxetine (PROZAC) 40 MG capsule    Sig: Take 1 capsule (40 mg total) by mouth daily.    Dispense:  90 capsule    Refill:  1    Order Specific Question:  Supervising Provider    Answer:  Ernestina Penna [[id number]]  . lisinopril (PRINIVIL,ZESTRIL) 20 MG tablet    Sig: Take 1 tablet (20 mg total) by mouth daily.    Dispense:  90 tablet    Refill:  1    Order Specific Question:  Supervising Provider    Answer:  Ernestina Penna [[id number]]  . glimepiride (AMARYL) 2 MG tablet    Sig: Take 1 tablet (2 mg total) by mouth daily before breakfast.    Dispense:  90 tablet    Refill:  1    Order Specific Question:  Supervising Provider    Answer:  Ernestina Penna [[id number]]  . clonazePAM (KLONOPIN) 2 MG tablet    Sig: Take 1 tablet (2 mg total) by mouth 3 (three) times daily as needed for anxiety.    Dispense:  90 tablet    Refill:  1    Order Specific Question:  Supervising Provider    Answer:  Ernestina Penna [[id number]]  . sitaGLIPtin (JANUVIA) 100 MG tablet    Sig: Take 1 tablet (100 mg total) by mouth daily.    Dispense:  90 tablet    Refill:  1    Order Specific Question:   Supervising Provider    Answer:  Ernestina Penna [[id number]]  . rosuvastatin (CRESTOR) 10 MG tablet    Sig: Take 1 tablet (10 mg total) by mouth daily.    Dispense:  90 tablet    Refill:  1    Order Specific Question:  Supervising Provider    Answer:  Ernestina Penna [[id number]]  . metFORMIN (GLUCOPHAGE) 1000 MG tablet    Sig: Take 1 tablet (1,000 mg total) by mouth 2 (two) times daily.    Dispense:  180 tablet    Refill:  1    Order Specific Question:  Supervising Provider    Answer:  Lewayne Bunting [[id number]]    Continue all meds Labs pending Diet and exercise encouraged Health maintenance reviewed Follow up in 3 months  Mary-Margaret Daphine Deutscher, FNP

## 2013-02-17 LAB — SPECIMEN STATUS REPORT

## 2013-02-17 LAB — CMP14+EGFR
ALT: 32 IU/L (ref 0–44)
AST: 16 IU/L (ref 0–40)
Albumin/Globulin Ratio: 1.7 (ref 1.1–2.5)
Alkaline Phosphatase: 48 IU/L (ref 39–117)
CO2: 19 mmol/L (ref 18–29)
Calcium: 9.1 mg/dL (ref 8.7–10.2)
Chloride: 99 mmol/L (ref 97–108)
Creatinine, Ser: 0.85 mg/dL (ref 0.76–1.27)
GFR calc non Af Amer: 100 mL/min/{1.73_m2} (ref >59)
Globulin, Total: 2.5 g/dL (ref 1.5–4.5)
Glucose: 119 mg/dL — ABNORMAL HIGH (ref 65–99)
Sodium: 138 mmol/L (ref 134–144)

## 2013-02-17 LAB — NMR, LIPOPROFILE
HDL Cholesterol by NMR: 41 mg/dL (ref >=40)
HDL Particle Number: 30.8 umol/L (ref >=30.5)
LDL Particle Number: 1562 nmol/L — ABNORMAL HIGH (ref <1000)
LDL Size: 20.8 nm (ref >20.5)
LDLC SERPL CALC-MCNC: 100 mg/dL — ABNORMAL HIGH (ref <100)

## 2013-02-24 ENCOUNTER — Telehealth: Payer: Self-pay | Admitting: Family Medicine

## 2013-02-24 DIAGNOSIS — R2681 Unsteadiness on feet: Secondary | ICD-10-CM

## 2013-02-24 NOTE — Telephone Encounter (Signed)
Needs a cane printed out for him please

## 2013-03-05 ENCOUNTER — Telehealth: Payer: Self-pay | Admitting: Nurse Practitioner

## 2013-03-05 DIAGNOSIS — R2681 Unsteadiness on feet: Secondary | ICD-10-CM

## 2013-03-09 NOTE — Telephone Encounter (Signed)
He needs a new cane RX today, ours prints out to say "for home use only" this will not work? Do a manual one and have nurse to call him pick up

## 2013-03-15 ENCOUNTER — Telehealth: Payer: Self-pay | Admitting: Nurse Practitioner

## 2013-03-16 ENCOUNTER — Telehealth: Payer: Self-pay | Admitting: Nurse Practitioner

## 2013-03-16 NOTE — Telephone Encounter (Signed)
PT WANTS TO START TEST INJECTIONS BUT HAD LABS DONE IN AUGUST AND WERE NORMAL.

## 2013-03-16 NOTE — Telephone Encounter (Addendum)
We usually do not do injections we usually do topical testosterone. Testosteron was low normal range and we normally do not treat that anyway- will rechck in 6 months

## 2013-03-17 NOTE — Telephone Encounter (Signed)
Pt notified. Offered appt but pt refused.

## 2013-03-23 MED ORDER — SILDENAFIL CITRATE 100 MG PO TABS: 100.0000 mg | ORAL_TABLET | Freq: Every day | ORAL | Status: DC | PRN

## 2013-03-23 MED ORDER — SILDENAFIL CITRATE 100 MG PO TABS: 50.0000 mg | ORAL_TABLET | Freq: Every day | ORAL | Status: DC | PRN

## 2013-03-23 NOTE — Telephone Encounter (Signed)
rx ready for pickup 

## 2013-03-23 NOTE — Telephone Encounter (Signed)
Please check and see if have coupon

## 2013-03-23 NOTE — Telephone Encounter (Signed)
Would like the Viagra coupon you gave him awhile back please

## 2013-03-23 NOTE — Telephone Encounter (Signed)
Please print RX for coupon

## 2013-03-30 NOTE — Progress Notes (Signed)
Pulmonary Rehabilitation Program Outcomes Report   Orientation:  09/15/2012 Graduate Date:  NA Discharge Date:  11/04/2012 # of sessions completed: 4 DX: Pulmonary COPD  Pulmonologist: Quintella Baton MD:  Paulene Floor Class Time:  13:00  A.  Exercise Program:  Tolerates exercise @ 3.79 METS for 15 minutes and Walk Test Results:  Pre: Pre Walk Test: Resting Hr 54, BP 132/82, O2 99%, RPE9, and RPD 11, 6 minute HR 64, BP 130/90, O2 99%, RPE 15 and RPD 15, Post HR 54, 102/60 O2 99%, RPE 9, and RPD 13, walked 160 ft at .189 mph at 1.14 METS did not finish test. He was SOB. No post walk Test performed due to the fact the patient never returned to rehab.   B.  Mental Health:  Good mental attitude  C.  Education/Instruction/Skills  Accurately checks own pulse.  Rest:  88  Exercise:   77, Knows THR for exercise, Uses Perceived Exertion Scale and/or Dyspnea Scale and Attended 1 education classes  Uses Perceived Exertion Scale and/or Dyspnea Scale  D.  Nutrition/Weight Control/Body Composition:  Adherence to prescribed nutrition program: good    E.  Blood Lipids    Lab Results  Component Value Date   CHOL 163 02/15/2013   HDL 30* 03/29/2010   LDLCALC 79 09/11/2012   TRIG 93 09/11/2012   CHOLHDL 8.1 03/29/2010    F.  Lifestyle Changes:  Making positive lifestyle changes  G.  Symptoms noted with exercise:  Asymptomatic  Report Completed By:  Lelon Huh. Lilburn Straw RN   Comments:  This is patients Discharge report. He only attended 4 sessions. His resting HR was 88 and resting BP was 104/62, His peak HR was 77 and his peak BP was 110/70. He left rehab sick and never returned. He did well while here.

## 2013-03-30 NOTE — Addendum Note (Signed)
Encounter addended by: Angelica Pou, RN on: 03/30/2013 10:56 AM<BR>     Documentation filed: Notes Section

## 2013-03-30 NOTE — Addendum Note (Signed)
Encounter addended by: Angelica Pou, RN on: 03/30/2013 11:00 AM<BR>     Documentation filed: Clinical Notes

## 2013-05-13 ENCOUNTER — Encounter: Payer: Self-pay | Admitting: Nurse Practitioner

## 2013-05-13 ENCOUNTER — Ambulatory Visit (INDEPENDENT_AMBULATORY_CARE_PROVIDER_SITE_OTHER): Payer: 59 | Admitting: Nurse Practitioner

## 2013-05-13 VITALS — BP 109/66 | HR 58 | Temp 97.0°F | Ht 71.0 in | Wt 193.0 lb

## 2013-05-13 DIAGNOSIS — E785 Hyperlipidemia, unspecified: Secondary | ICD-10-CM

## 2013-05-13 DIAGNOSIS — IMO0002 Reserved for concepts with insufficient information to code with codable children: Secondary | ICD-10-CM

## 2013-05-13 DIAGNOSIS — J4489 Other specified chronic obstructive pulmonary disease: Secondary | ICD-10-CM

## 2013-05-13 DIAGNOSIS — J449 Chronic obstructive pulmonary disease, unspecified: Secondary | ICD-10-CM

## 2013-05-13 DIAGNOSIS — I2510 Atherosclerotic heart disease of native coronary artery without angina pectoris: Secondary | ICD-10-CM

## 2013-05-13 DIAGNOSIS — F411 Generalized anxiety disorder: Secondary | ICD-10-CM

## 2013-05-13 DIAGNOSIS — I10 Essential (primary) hypertension: Secondary | ICD-10-CM

## 2013-05-13 DIAGNOSIS — E1165 Type 2 diabetes mellitus with hyperglycemia: Secondary | ICD-10-CM

## 2013-05-13 DIAGNOSIS — K219 Gastro-esophageal reflux disease without esophagitis: Secondary | ICD-10-CM

## 2013-05-13 DIAGNOSIS — E119 Type 2 diabetes mellitus without complications: Secondary | ICD-10-CM

## 2013-05-13 DIAGNOSIS — IMO0001 Reserved for inherently not codable concepts without codable children: Secondary | ICD-10-CM

## 2013-05-13 LAB — POCT GLYCOSYLATED HEMOGLOBIN (HGB A1C): HEMOGLOBIN A1C: 6.5

## 2013-05-13 MED ORDER — ALBUTEROL SULFATE (2.5 MG/3ML) 0.083% IN NEBU: 2.5000 mg | INHALATION_SOLUTION | Freq: Four times a day (QID) | RESPIRATORY_TRACT | Status: DC | PRN

## 2013-05-13 MED ORDER — BECLOMETHASONE DIPROPIONATE 80 MCG/ACT IN AERS: 1.0000 | INHALATION_SPRAY | RESPIRATORY_TRACT | Status: DC | PRN

## 2013-05-13 MED ORDER — CLONAZEPAM 2 MG PO TABS: 2.0000 mg | ORAL_TABLET | Freq: Three times a day (TID) | ORAL | Status: DC | PRN

## 2013-05-13 MED ORDER — METFORMIN HCL 1000 MG PO TABS: 1000.0000 mg | ORAL_TABLET | Freq: Two times a day (BID) | ORAL | Status: DC

## 2013-05-13 MED ORDER — PANTOPRAZOLE SODIUM 40 MG PO TBEC: 40.0000 mg | DELAYED_RELEASE_TABLET | Freq: Every day | ORAL | Status: DC

## 2013-05-13 MED ORDER — GLIMEPIRIDE 2 MG PO TABS: 2.0000 mg | ORAL_TABLET | Freq: Every day | ORAL | Status: DC

## 2013-05-13 MED ORDER — ISOSORBIDE MONONITRATE ER 60 MG PO TB24: 60.0000 mg | ORAL_TABLET | Freq: Every day | ORAL | Status: DC

## 2013-05-13 MED ORDER — CLOPIDOGREL BISULFATE 75 MG PO TABS: 75.0000 mg | ORAL_TABLET | Freq: Every day | ORAL | Status: DC

## 2013-05-13 MED ORDER — METOPROLOL SUCCINATE ER 50 MG PO TB24: 50.0000 mg | ORAL_TABLET | Freq: Every day | ORAL | Status: DC

## 2013-05-13 MED ORDER — TRAZODONE HCL 50 MG PO TABS: 75.0000 mg | ORAL_TABLET | Freq: Every day | ORAL | Status: DC | PRN

## 2013-05-13 MED ORDER — LISINOPRIL 20 MG PO TABS: 20.0000 mg | ORAL_TABLET | Freq: Every day | ORAL | Status: DC

## 2013-05-13 MED ORDER — FLUOXETINE HCL 40 MG PO CAPS: 40.0000 mg | ORAL_CAPSULE | Freq: Every day | ORAL | Status: DC

## 2013-05-13 NOTE — Progress Notes (Signed)
Subjective:    Patient ID: Derek Chen, male    DOB: 29-Nov-1960, 53 y.o.   MRN: [id number]  HPI  Patient in today for follow up of chronic medical problems. He is doing well- on continuous O2 now because he could not maintain his o2 sat. He is seeing pulmonologist in [place]. He is a diabetic and fasting blood sugars are running less than 130- he is watching his diet- Unable to exercise due to CAD and his SOB. Patient Active Problem List   Diagnosis Date Noted  . Type II or unspecified type diabetes mellitus without mention of complication, not stated as uncontrolled 11/06/2012  . BPH (benign prostatic hyperplasia) 11/06/2012  . 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 Palpitations 03/29/2012  . Dyspnea and respiratory abnormality 03/29/2012  . Acute myocardial infarction, subendocardial infarction 03/27/2012  . Heart disease 03/27/2012  . 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 Coronary atherosclerosis 03/27/2012  . Essential hypertension, benign   . Coronary atherosclerosis of native coronary artery   . HYPERLIPIDEMIA 04/19/2010   Outpatient Encounter Prescriptions as of 05/13/2013  Medication Sig  . albuterol (PROVENTIL) (2.5 MG/3ML) 0.083% nebulizer solution   . aspirin EC 81 MG tablet Take 81 mg by mouth daily.  . beclomethasone (QVAR) 80 MCG/ACT inhaler Inhale 1 puff into the lungs as needed.  . clonazePAM (KLONOPIN) 2 MG tablet Take 1 tablet (2 mg total) by mouth 3 (three) times daily as needed for anxiety.  . clopidogrel (PLAVIX) 75 MG tablet Take 1 tablet (75 mg total) by mouth daily. Take by mouth. Take 75 mg by mouth daily.  Marland Kitchen FLUoxetine (PROZAC) 40 MG capsule Take 1 capsule (40 mg total) by mouth daily.  . fluticasone (FLONASE) 50 MCG/ACT nasal spray Place 2 sprays into the nose daily.  Marland Kitchen glimepiride (AMARYL) 2 MG tablet Take 1 tablet (2 mg total) by mouth daily before breakfast.  . HYDROcodone-homatropine (HYCODAN) 5-1.5 MG/5ML syrup   . isosorbide  mononitrate (IMDUR) 60 MG 24 hr tablet Take 1 tablet (60 mg total) by mouth daily.  Marland Kitchen lisinopril (PRINIVIL,ZESTRIL) 20 MG tablet Take 1 tablet (20 mg total) by mouth daily.  . metFORMIN (GLUCOPHAGE) 1000 MG tablet Take 1 tablet (1,000 mg total) by mouth 2 (two) times daily.  . metoprolol succinate (TOPROL-XL) 50 MG 24 hr tablet Take 1 tablet (50 mg total) by mouth daily. Take with or immediately following a meal.  . nitroGLYCERIN (NITROSTAT) 0.4 MG SL tablet Place 1 tablet (0.4 mg total) under the tongue every 5 (five) minutes as needed.  . pantoprazole (PROTONIX) 40 MG tablet Take 1 tablet (40 mg total) by mouth daily.  . rosuvastatin (CRESTOR) 10 MG tablet Take 1 tablet (10 mg total) by mouth daily.  . sildenafil (VIAGRA) 100 MG tablet Take 1 tablet (100 mg total) by mouth daily as needed for erectile dysfunction.  . sildenafil (VIAGRA) 100 MG tablet Take 0.5-1 tablets (50-100 mg total) by mouth daily as needed for erectile dysfunction.  . sitaGLIPtin (JANUVIA) 100 MG tablet Take 1 tablet (100 mg total) by mouth daily.  Marland Kitchen tiotropium (SPIRIVA HANDIHALER) 18 MCG inhalation capsule Place 1 capsule (18 mcg total) into inhaler and inhale once. Place 1 capsule into inhaler and inh daily.  . traZODone (DESYREL) 50 MG tablet Take 1.5 tablets (75 mg total) by mouth daily as needed. For sleep       Review of Systems  Constitutional: Negative.   HENT: Negative.   Respiratory: Positive for chest tightness and shortness  of breath.   Cardiovascular: Positive for chest pain (ocxcassional).  Gastrointestinal: Negative.   Genitourinary: Negative.   Musculoskeletal: Negative.   Neurological: Negative.   All other systems reviewed and are negative.       Objective:   Physical Exam  Constitutional: He is oriented to person, place, and time. He appears well-developed and well-nourished.  HENT:  Head: Normocephalic.  Right Ear: External ear normal.  Left Ear: External ear normal.  Nose: Nose  normal.  Mouth/Throat: Oropharynx is clear and moist.  Eyes: EOM are normal. Pupils are equal, round, and reactive to light.  Neck: Normal range of motion. Neck supple. No JVD present. No thyromegaly present.  Cardiovascular: Normal rate, regular rhythm, normal heart sounds and intact distal pulses.  Exam reveals no gallop and no friction rub.   No murmur heard. Pulmonary/Chest: Effort normal and breath sounds normal. No respiratory distress. He has no wheezes. He has no rales. He exhibits no tenderness.  Abdominal: Soft. Bowel sounds are normal. He exhibits no mass. There is no tenderness.  Genitourinary:  Prostate check last week  Musculoskeletal: Normal range of motion. He exhibits no edema.  Lymphadenopathy:    He has no cervical adenopathy.  Neurological: He is alert and oriented to person, place, and time. No cranial nerve deficit.  Skin: Skin is warm and dry.  Psychiatric: He has a normal mood and affect. His behavior is normal. Judgment and thought content normal.   BP 109/66  Pulse 58  Temp(Src) 97 F (36.1 C) (Oral)  Ht '5\' 11"'  (1.803 m)  Wt 193 lb (87.544 kg)  BMI 26.93 kg/m2   Results for orders placed in visit on 05/13/13  POCT GLYCOSYLATED HEMOGLOBIN (HGB A1C)      Result Value Ref Range   Hemoglobin A1C 6.5          Assessment & Plan:   1. HYPERLIPIDEMIA   2. Essential hypertension, benign   3. Type II or unspecified type diabetes mellitus without mention of complication, not stated as uncontrolled   4. COPD bronchitis   5. GAD (generalized anxiety disorder)   6. Diabetes type 2, uncontrolled   7. CAD (coronary artery disease)   8. Hypertension   9. GERD (gastroesophageal reflux disease)    Orders Placed This Encounter  Procedures  . NMR, lipoprofile  . CMP14+EGFR  . POCT glycosylated hemoglobin (Hb A1C)   Meds ordered this encounter  Medications  . albuterol (PROVENTIL) (2.5 MG/3ML) 0.083% nebulizer solution    Sig: Take 3 mLs (2.5 mg total) by  nebulization every 6 (six) hours as needed for wheezing or shortness of breath.    Dispense:  75 mL    Refill:  3    Order Specific Question:  Supervising Provider    Answer:  Chipper Herb [[id number]]  . beclomethasone (QVAR) 80 MCG/ACT inhaler    Sig: Inhale 1 puff into the lungs as needed.    Dispense:  3 Inhaler    Refill:  1    Order Specific Question:  Supervising Provider    Answer:  Chipper Herb [[id number]]  . clonazePAM (KLONOPIN) 2 MG tablet    Sig: Take 1 tablet (2 mg total) by mouth 3 (three) times daily as needed for anxiety.    Dispense:  90 tablet    Refill:  1    Order Specific Question:  Supervising Provider    Answer:  Chipper Herb [[id number]]  . clopidogrel (PLAVIX) 75 MG tablet    Sig:  Take 1 tablet (75 mg total) by mouth daily. Take by mouth. Take 75 mg by mouth daily.    Dispense:  90 tablet    Refill:  1    Order Specific Question:  Supervising Provider    Answer:  Chipper Herb [[id number]]  . FLUoxetine (PROZAC) 40 MG capsule    Sig: Take 1 capsule (40 mg total) by mouth daily.    Dispense:  90 capsule    Refill:  1    Order Specific Question:  Supervising Provider    Answer:  Chipper Herb [[id number]]  . glimepiride (AMARYL) 2 MG tablet    Sig: Take 1 tablet (2 mg total) by mouth daily before breakfast.    Dispense:  90 tablet    Refill:  1    Order Specific Question:  Supervising Provider    Answer:  Chipper Herb [[id number]]  . isosorbide mononitrate (IMDUR) 60 MG 24 hr tablet    Sig: Take 1 tablet (60 mg total) by mouth daily.    Dispense:  90 tablet    Refill:  1    Order Specific Question:  Supervising Provider    Answer:  Chipper Herb [[id number]]  . lisinopril (PRINIVIL,ZESTRIL) 20 MG tablet    Sig: Take 1 tablet (20 mg total) by mouth daily.    Dispense:  90 tablet    Refill:  1    Order Specific Question:  Supervising Provider    Answer:  Chipper Herb [[id number]]  . metFORMIN (GLUCOPHAGE) 1000 MG tablet    Sig: Take 1 tablet (1,000 mg total) by mouth 2  (two) times daily.    Dispense:  180 tablet    Refill:  1    Order Specific Question:  Supervising Provider    Answer:  Lelon Perla [[id number]]  . metoprolol succinate (TOPROL-XL) 50 MG 24 hr tablet    Sig: Take 1 tablet (50 mg total) by mouth daily. Take with or immediately following a meal.    Dispense:  90 tablet    Refill:  1    Order Specific Question:  Supervising Provider    Answer:  Chipper Herb [[id number]]  . pantoprazole (PROTONIX) 40 MG tablet    Sig: Take 1 tablet (40 mg total) by mouth daily.    Dispense:  90 tablet    Refill:  1    Order Specific Question:  Supervising Provider    Answer:  Kirk Ruths S [[id number]]  . traZODone (DESYREL) 50 MG tablet    Sig: Take 1.5 tablets (75 mg total) by mouth daily as needed. For sleep    Dispense:  90 tablet    Refill:  1    Order Specific Question:  Supervising Provider    Answer:  Chipper Herb [[id number]]    Labs pending Health maintenance reviewed Diet and exercise encouraged Continue all meds Follow up  In 3 months   [place], FNP

## 2013-05-13 NOTE — Patient Instructions (Signed)
Diabetes and Foot Care Diabetes may cause you to have problems because of poor blood supply (circulation) to your feet and legs. This may cause the skin on your feet to become thinner, break easier, and heal more slowly. Your skin may become dry, and the skin may peel and crack. You may also have nerve damage in your legs and feet causing decreased feeling in them. You may not notice minor injuries to your feet that could lead to infections or more serious problems. Taking care of your feet is one of the most important things you can do for yourself.  HOME CARE INSTRUCTIONS  Wear shoes at all times, even in the house. Do not go barefoot. Bare feet are easily injured.  Check your feet daily for blisters, cuts, and redness. If you cannot see the bottom of your feet, use a mirror or ask someone for help.  Wash your feet with warm water (do not use hot water) and mild soap. Then pat your feet and the areas between your toes until they are completely dry. Do not soak your feet as this can dry your skin.  Apply a moisturizing lotion or petroleum jelly (that does not contain alcohol and is unscented) to the skin on your feet and to dry, brittle toenails. Do not apply lotion between your toes.  Trim your toenails straight across. Do not dig under them or around the cuticle. File the edges of your nails with an emery board or nail file.  Do not cut corns or calluses or try to remove them with medicine.  Wear clean socks or stockings every day. Make sure they are not too tight. Do not wear knee-high stockings since they may decrease blood flow to your legs.  Wear shoes that fit properly and have enough cushioning. To break in new shoes, wear them for just a few hours a day. This prevents you from injuring your feet. Always look in your shoes before you put them on to be sure there are no objects inside.  Do not cross your legs. This may decrease the blood flow to your feet.  If you find a minor scrape,  cut, or break in the skin on your feet, keep it and the skin around it clean and dry. These areas may be cleansed with mild soap and water. Do not cleanse the area with peroxide, alcohol, or iodine.  When you remove an adhesive bandage, be sure not to damage the skin around it.  If you have a wound, look at it several times a day to make sure it is healing.  Do not use heating pads or hot water bottles. They may burn your skin. If you have lost feeling in your feet or legs, you may not know it is happening until it is too late.  Make sure your health care provider performs a complete foot exam at least annually or more often if you have foot problems. Report any cuts, sores, or bruises to your health care provider immediately. SEEK MEDICAL CARE IF:   You have an injury that is not healing.  You have cuts or breaks in the skin.  You have an ingrown nail.  You notice redness on your legs or feet.  You feel burning or tingling in your legs or feet.  You have pain or cramps in your legs and feet.  Your legs or feet are numb.  Your feet always feel cold. SEEK IMMEDIATE MEDICAL CARE IF:   There is increasing redness,   swelling, or pain in or around a wound.  There is a red line that goes up your leg.  Pus is coming from a wound.  You develop a fever or as directed by your health care provider.  You notice a bad smell coming from an ulcer or wound. Document Released: 03/15/2000 Document Revised: 11/18/2012 Document Reviewed: 08/25/2012 Exit[employer] Patient Information 2014 [employer].  

## 2013-05-14 ENCOUNTER — Ambulatory Visit: Payer: 59 | Admitting: Nurse Practitioner

## 2013-05-14 LAB — NMR, LIPOPROFILE
Cholesterol: 175 mg/dL (ref <200)
HDL Cholesterol by NMR: 42 mg/dL (ref >=40)
HDL Particle Number: 31 umol/L (ref >=30.5)
LDL PARTICLE NUMBER: 1551 nmol/L — AB (ref <1000)
LDL SIZE: 20.4 nm — AB (ref >20.5)
LDLC SERPL CALC-MCNC: 94 mg/dL (ref <100)
LP-IR Score: 89 — ABNORMAL HIGH (ref <=45)
SMALL LDL PARTICLE NUMBER: 957 nmol/L — AB (ref <=527)
Triglycerides by NMR: 193 mg/dL — ABNORMAL HIGH (ref <150)

## 2013-05-14 LAB — CMP14+EGFR
ALBUMIN: 4.6 g/dL (ref 3.5–5.5)
ALT: 41 IU/L (ref 0–44)
AST: 20 IU/L (ref 0–40)
Albumin/Globulin Ratio: 1.9 (ref 1.1–2.5)
Alkaline Phosphatase: 42 IU/L (ref 39–117)
BUN/Creatinine Ratio: 18 (ref 9–20)
BUN: 16 mg/dL (ref 6–24)
CALCIUM: 9.3 mg/dL (ref 8.7–10.2)
CHLORIDE: 95 mmol/L — AB (ref 97–108)
CO2: 20 mmol/L (ref 18–29)
Creatinine, Ser: 0.88 mg/dL (ref 0.76–1.27)
GFR calc Af Amer: 114 mL/min/{1.73_m2} (ref >59)
GFR calc non Af Amer: 99 mL/min/{1.73_m2} (ref >59)
GLUCOSE: 136 mg/dL — AB (ref 65–99)
Globulin, Total: 2.4 g/dL (ref 1.5–4.5)
Potassium: 5 mmol/L (ref 3.5–5.2)
Sodium: 134 mmol/L (ref 134–144)
Total Bilirubin: 0.7 mg/dL (ref 0.0–1.2)
Total Protein: 7 g/dL (ref 6.0–8.5)

## 2013-05-25 ENCOUNTER — Ambulatory Visit (INDEPENDENT_AMBULATORY_CARE_PROVIDER_SITE_OTHER): Payer: 59 | Admitting: Urology

## 2013-05-25 DIAGNOSIS — N486 Induration penis plastica: Secondary | ICD-10-CM

## 2013-05-25 DIAGNOSIS — N529 Male erectile dysfunction, unspecified: Secondary | ICD-10-CM

## 2013-05-26 ENCOUNTER — Telehealth: Payer: Self-pay | Admitting: Nurse Practitioner

## 2013-05-26 NOTE — Telephone Encounter (Signed)
Samples given.  

## 2013-05-31 ENCOUNTER — Telehealth: Payer: Self-pay | Admitting: Family Medicine

## 2013-05-31 ENCOUNTER — Encounter: Payer: Self-pay | Admitting: Nurse Practitioner

## 2013-05-31 NOTE — Telephone Encounter (Signed)
Who does this need to be written to

## 2013-05-31 NOTE — Telephone Encounter (Signed)
Needs you to write a letter stating that he needs to use his oxygen as needed do to SOB and COPD

## 2013-05-31 NOTE — Telephone Encounter (Signed)
leter ready for pick up

## 2013-05-31 NOTE — Telephone Encounter (Signed)
Patient aware.

## 2013-05-31 NOTE — Telephone Encounter (Signed)
To whom it may concern

## 2013-06-01 ENCOUNTER — Ambulatory Visit (INDEPENDENT_AMBULATORY_CARE_PROVIDER_SITE_OTHER): Payer: 59 | Admitting: Urology

## 2013-06-01 DIAGNOSIS — N529 Male erectile dysfunction, unspecified: Secondary | ICD-10-CM

## 2013-06-16 ENCOUNTER — Telehealth: Payer: Self-pay | Admitting: Nurse Practitioner

## 2013-06-16 NOTE — Telephone Encounter (Signed)
Appt scheduled. Patient aware. 

## 2013-06-16 NOTE — Telephone Encounter (Signed)
Patient NTBS for follow up and lab work  

## 2013-06-21 ENCOUNTER — Encounter: Payer: Self-pay | Admitting: Nurse Practitioner

## 2013-06-21 ENCOUNTER — Ambulatory Visit (INDEPENDENT_AMBULATORY_CARE_PROVIDER_SITE_OTHER): Payer: 59 | Admitting: Nurse Practitioner

## 2013-06-21 VITALS — BP 122/76 | HR 71 | Temp 96.5°F | Ht 71.0 in | Wt 197.0 lb

## 2013-06-21 DIAGNOSIS — F3289 Other specified depressive episodes: Secondary | ICD-10-CM

## 2013-06-21 DIAGNOSIS — J209 Acute bronchitis, unspecified: Secondary | ICD-10-CM

## 2013-06-21 DIAGNOSIS — F329 Major depressive disorder, single episode, unspecified: Secondary | ICD-10-CM

## 2013-06-21 DIAGNOSIS — F411 Generalized anxiety disorder: Secondary | ICD-10-CM

## 2013-06-21 DIAGNOSIS — F32A Depression, unspecified: Secondary | ICD-10-CM

## 2013-06-21 MED ORDER — CLONAZEPAM 2 MG PO TABS: 2.0000 mg | ORAL_TABLET | Freq: Three times a day (TID) | ORAL | Status: DC | PRN

## 2013-06-21 MED ORDER — LEVOFLOXACIN 500 MG PO TABS: 500.0000 mg | ORAL_TABLET | Freq: Every day | ORAL | Status: DC

## 2013-06-21 MED ORDER — SERTRALINE HCL 100 MG PO TABS: 100.0000 mg | ORAL_TABLET | Freq: Every day | ORAL | Status: DC

## 2013-06-21 NOTE — Progress Notes (Addendum)
   Subjective:    Patient ID: Derek Chen, male    DOB: September 03, 1960, 53 y.o.   MRN: [id number]  HPI- Patient in c/o GAD- he is currently on clonazepam 2 mg TID- says that it is not helping at all- stays anxious- feels useless- taking prozac  For depression. -Also c/o cough that started 2 weeks ago- is now coughing up greenish phlegm and he feels more SOB then usual.   Review of Systems  Constitutional: Negative.   HENT: Negative.   Eyes: Negative.   Respiratory: Negative.   Cardiovascular: Negative.   Musculoskeletal: Negative.   Psychiatric/Behavioral: Positive for dysphoric mood.  All other systems reviewed and are negative.       Objective:   Physical Exam  Constitutional: He is oriented to person, place, and time. He appears well-developed and well-nourished.  HENT:  Right Ear: External ear normal.  Left Ear: External ear normal.  Nose: Nose normal.  Mouth/Throat: Oropharynx is clear and moist.  Eyes: Pupils are equal, round, and reactive to light.  Neck: Normal range of motion. Neck supple.  Cardiovascular: Normal rate, regular rhythm and normal heart sounds.   Pulmonary/Chest: Effort normal and breath sounds normal.  o2 via [place] Dry cough  Lymphadenopathy:    He has no cervical adenopathy.  Neurological: He is alert and oriented to person, place, and time.  Skin: Skin is warm and dry.   BP 122/76  Pulse 71  Temp(Src) 96.5 F (35.8 C) (Oral)  Ht 5\' 11"  (1.803 m)  Wt 197 lb (89.359 kg)  BMI 27.49 kg/m2        Assessment & Plan:  1. Depression Stress manageent Exercise Stopped prozac and changed to zoloft - sertraline (ZOLOFT) 100 MG tablet; Take 1 tablet (100 mg total) by mouth daily.  Dispense: 30 tablet; Refill: 3 Follow up in 1 month Continue clonazeam as rx for now.  2. Acute bronchitis 1. Take meds as prescribed 2. Use a cool mist humidifier especially during the winter months and when heat has been humid. 3. Use saline nose sprays  frequently 4. Saline irrigations of the nose can be very helpful if done frequently.  * 4X daily for 1 week*  * Use of a nettie pot can be helpful with this. Follow directions with this* 5. Drink plenty of fluids 6. Keep thermostat turn down low 7.For any cough or congestion  Use plain Mucinex- regular strength or max strength is fine   * Children- consult with Pharmacist for dosing 8. For fever or aces or pains- take tylenol or ibuprofen appropriate for age and weight.  * for fevers greater than 101 orally you may alternate ibuprofen and tylenol every  3 hours.   - levofloxacin (LEVAQUIN) 500 MG tablet; Take 1 tablet (500 mg total) by mouth daily.  Dispense: 7 tablet; Refill: 0  Mary-Margaret Hassell Done, FNP

## 2013-06-21 NOTE — Addendum Note (Signed)
Addended by: Chevis Pretty on: 06/21/2013 03:20 PM   Modules accepted: Orders

## 2013-06-21 NOTE — Patient Instructions (Signed)

## 2013-06-24 ENCOUNTER — Telehealth: Payer: Self-pay | Admitting: Nurse Practitioner

## 2013-06-24 MED ORDER — HYDROCODONE-HOMATROPINE 5-1.5 MG/5ML PO SYRP: 5.0000 mL | ORAL_SOLUTION | Freq: Four times a day (QID) | ORAL | Status: DC | PRN

## 2013-06-24 NOTE — Telephone Encounter (Signed)
Aware,rx ready. 

## 2013-06-24 NOTE — Telephone Encounter (Signed)
rx ready for pickup 

## 2013-06-28 ENCOUNTER — Telehealth: Payer: Self-pay | Admitting: General Practice

## 2013-06-29 ENCOUNTER — Telehealth: Payer: Self-pay | Admitting: Family Medicine

## 2013-06-29 NOTE — Telephone Encounter (Signed)
Patient aware mmm is not here until next week

## 2013-06-30 NOTE — Telephone Encounter (Signed)
Called [facility] in [place], they said pt does not qualify for portable tanks, a Rx will not help? Notified pt

## 2013-07-09 ENCOUNTER — Telehealth: Payer: Self-pay | Admitting: Nurse Practitioner

## 2013-07-09 NOTE — Telephone Encounter (Signed)
Patient aware.

## 2013-07-15 ENCOUNTER — Ambulatory Visit (INDEPENDENT_AMBULATORY_CARE_PROVIDER_SITE_OTHER): Payer: 59 | Admitting: Nurse Practitioner

## 2013-07-15 ENCOUNTER — Encounter: Payer: Self-pay | Admitting: Nurse Practitioner

## 2013-07-15 VITALS — BP 152/97 | HR 65 | Temp 97.8°F | Ht 71.0 in | Wt 199.4 lb

## 2013-07-15 DIAGNOSIS — J449 Chronic obstructive pulmonary disease, unspecified: Secondary | ICD-10-CM | POA: Insufficient documentation

## 2013-07-15 DIAGNOSIS — R0602 Shortness of breath: Secondary | ICD-10-CM

## 2013-07-15 DIAGNOSIS — I429 Cardiomyopathy, unspecified: Secondary | ICD-10-CM

## 2013-07-15 MED ORDER — BENZONATATE 100 MG PO CAPS: 100.0000 mg | ORAL_CAPSULE | Freq: Two times a day (BID) | ORAL | Status: DC | PRN

## 2013-07-15 NOTE — Patient Instructions (Signed)

## 2013-07-15 NOTE — Progress Notes (Signed)
   Subjective:    Patient ID: Derek Chen, male    DOB: [birth year], 53 y.o.   MRN: [id number]  HPI Patient goes to [facility] pulmonolgy group and was put on o2 at home 2 months ago by them. Here today to see if he qualifies for O2 at home- He says that he stays SOB all the time and uses his O2 around the clock..    Review of Systems  Constitutional: Negative.   HENT: Negative.   Respiratory: Positive for shortness of breath.   Cardiovascular: Negative.   Gastrointestinal: Negative.   Genitourinary: Negative.        Objective:   Physical Exam  Constitutional: He appears well-developed and well-nourished.  Cardiovascular: Normal rate, regular rhythm and normal heart sounds.   Pulmonary/Chest: Effort normal and breath sounds normal. No respiratory distress. He has no wheezes. He has no rales. He exhibits no tenderness.  Sao2 97% on room air and 95 % with exercise.    BP 152/97  Pulse 65  Temp(Src) 97.8 F (36.6 C) (Oral)  Ht 5\' 11"  (1.803 m)  Wt 199 lb 6.4 oz (90.447 kg)  BMI 27.82 kg/m2  SpO2 97%       Assessment & Plan:    1. Secondary cardiomyopathy, unspecified   2. SOB (shortness of breath)   3. COPD, moderate      Has already qualified for O2 at night so that is all he will be allowed   Mary-Margaret Hassell Done, FNP

## 2013-07-22 ENCOUNTER — Telehealth: Payer: Self-pay | Admitting: Family Medicine

## 2013-07-22 MED ORDER — HYDROCODONE-HOMATROPINE 5-1.5 MG/5ML PO SYRP: 5.0000 mL | ORAL_SOLUTION | Freq: Four times a day (QID) | ORAL | Status: DC | PRN

## 2013-07-22 NOTE — Telephone Encounter (Signed)
rx ready for pick up Just got some last month- cannot continue to stay on because is an addictive medication.

## 2013-07-22 NOTE — Telephone Encounter (Signed)
Has tried everything otc for cough nothing is help will you please write hycodan cough syrup for him?

## 2013-07-28 ENCOUNTER — Ambulatory Visit (INDEPENDENT_AMBULATORY_CARE_PROVIDER_SITE_OTHER): Payer: 59 | Admitting: Nurse Practitioner

## 2013-07-28 ENCOUNTER — Encounter: Payer: Self-pay | Admitting: *Deleted

## 2013-07-28 ENCOUNTER — Encounter: Payer: Self-pay | Admitting: Nurse Practitioner

## 2013-07-28 VITALS — BP 139/95 | HR 60 | Temp 97.2°F | Ht 71.0 in | Wt 201.0 lb

## 2013-07-28 DIAGNOSIS — R002 Palpitations: Secondary | ICD-10-CM

## 2013-07-28 DIAGNOSIS — R42 Dizziness and giddiness: Secondary | ICD-10-CM

## 2013-07-28 MED ORDER — MECLIZINE HCL 25 MG PO TABS: 25.0000 mg | ORAL_TABLET | Freq: Three times a day (TID) | ORAL | Status: DC | PRN

## 2013-07-28 NOTE — Patient Instructions (Signed)

## 2013-07-28 NOTE — Progress Notes (Signed)
   Subjective:    Patient ID: Derek Chen, male    DOB: Nov 25, 1960, 53 y.o.   MRN: [id number]  HPI Patient was here 07/15/13 for o2 evaluation- He has been on O2 at night for some time but has been using all the time since his last hospitalization- When here his o2 sat did not drop below 95% without o2 and walking around building. Patient was told that he does not qualify for constant O2 and can only qualify for it at night like previous. Says that he does use it when he gets real SOB during the day- Says that he has been having dizzy spells. Had everyday last week but has only had 1 this week. Says that it feels like heart is skipping a beat and then he gets dizzy.   Review of Systems  Constitutional: Negative.   HENT: Negative.   Respiratory: Positive for shortness of breath.   Cardiovascular: Negative.   Gastrointestinal: Negative.   Genitourinary: Negative.   Neurological: Positive for dizziness.  Hematological: Negative.   Psychiatric/Behavioral: Negative.   All other systems reviewed and are negative.      Objective:   Physical Exam  Constitutional: He is oriented to person, place, and time. He appears well-developed and well-nourished.  Cardiovascular: Normal rate, regular rhythm and normal heart sounds.   Pulmonary/Chest: Effort normal and breath sounds normal.  Neurological: He is alert and oriented to person, place, and time.  Skin: Skin is warm and dry.  Psychiatric: He has a normal mood and affect. His behavior is normal. Judgment and thought content normal.    EKG- sinus rhythm- pulmonary disease-Mary-Margaret Hassell Done, FNP       Assessment & Plan:   1. Heart palpitations   2. Dizziness  Meds ordered this encounter  Medications  . DISCONTD: nitroGLYCERIN (NITROSTAT) 0.4 MG SL tablet    Sig: Place under the tongue.  . meclizine (ANTIVERT) 25 MG tablet    Sig: Take 1 tablet (25 mg total) by mouth 3 (three) times daily as needed for dizziness.    Dispense:  30  tablet    Refill:  0    Order Specific Question:  Supervising Provider    Answer:  Chipper Herb [[id number]]    Keep diary of occurence and how long lasts Follow up in 3 weeks  [place], FNP

## 2013-08-09 ENCOUNTER — Encounter: Payer: Self-pay | Admitting: Nurse Practitioner

## 2013-08-09 ENCOUNTER — Ambulatory Visit (INDEPENDENT_AMBULATORY_CARE_PROVIDER_SITE_OTHER): Payer: 59 | Admitting: Nurse Practitioner

## 2013-08-09 VITALS — BP 148/93 | HR 69 | Temp 98.4°F | Ht 71.0 in | Wt 196.0 lb

## 2013-08-09 DIAGNOSIS — K219 Gastro-esophageal reflux disease without esophagitis: Secondary | ICD-10-CM

## 2013-08-09 DIAGNOSIS — F3289 Other specified depressive episodes: Secondary | ICD-10-CM

## 2013-08-09 DIAGNOSIS — IMO0002 Reserved for concepts with insufficient information to code with codable children: Secondary | ICD-10-CM

## 2013-08-09 DIAGNOSIS — IMO0001 Reserved for inherently not codable concepts without codable children: Secondary | ICD-10-CM

## 2013-08-09 DIAGNOSIS — R42 Dizziness and giddiness: Secondary | ICD-10-CM

## 2013-08-09 DIAGNOSIS — J449 Chronic obstructive pulmonary disease, unspecified: Secondary | ICD-10-CM

## 2013-08-09 DIAGNOSIS — I219 Acute myocardial infarction, unspecified: Secondary | ICD-10-CM

## 2013-08-09 DIAGNOSIS — R0609 Other forms of dyspnea: Secondary | ICD-10-CM

## 2013-08-09 DIAGNOSIS — R0600 Dyspnea, unspecified: Secondary | ICD-10-CM

## 2013-08-09 DIAGNOSIS — E119 Type 2 diabetes mellitus without complications: Secondary | ICD-10-CM

## 2013-08-09 DIAGNOSIS — E291 Testicular hypofunction: Secondary | ICD-10-CM

## 2013-08-09 DIAGNOSIS — I214 Non-ST elevation (NSTEMI) myocardial infarction: Secondary | ICD-10-CM

## 2013-08-09 DIAGNOSIS — E785 Hyperlipidemia, unspecified: Secondary | ICD-10-CM

## 2013-08-09 DIAGNOSIS — I429 Cardiomyopathy, unspecified: Secondary | ICD-10-CM

## 2013-08-09 DIAGNOSIS — R768 Other specified abnormal immunological findings in serum: Secondary | ICD-10-CM

## 2013-08-09 DIAGNOSIS — F419 Anxiety disorder, unspecified: Secondary | ICD-10-CM

## 2013-08-09 DIAGNOSIS — F329 Major depressive disorder, single episode, unspecified: Secondary | ICD-10-CM

## 2013-08-09 DIAGNOSIS — F32A Depression, unspecified: Secondary | ICD-10-CM

## 2013-08-09 DIAGNOSIS — I10 Essential (primary) hypertension: Secondary | ICD-10-CM

## 2013-08-09 DIAGNOSIS — F411 Generalized anxiety disorder: Secondary | ICD-10-CM

## 2013-08-09 DIAGNOSIS — R0989 Other specified symptoms and signs involving the circulatory and respiratory systems: Secondary | ICD-10-CM

## 2013-08-09 DIAGNOSIS — R894 Abnormal immunological findings in specimens from other organs, systems and tissues: Secondary | ICD-10-CM

## 2013-08-09 DIAGNOSIS — R0689 Other abnormalities of breathing: Secondary | ICD-10-CM

## 2013-08-09 DIAGNOSIS — I519 Heart disease, unspecified: Secondary | ICD-10-CM

## 2013-08-09 DIAGNOSIS — I2510 Atherosclerotic heart disease of native coronary artery without angina pectoris: Secondary | ICD-10-CM

## 2013-08-09 DIAGNOSIS — E1165 Type 2 diabetes mellitus with hyperglycemia: Secondary | ICD-10-CM

## 2013-08-09 LAB — POCT GLYCOSYLATED HEMOGLOBIN (HGB A1C): Hemoglobin A1C: 6.5

## 2013-08-09 MED ORDER — FLUTICASONE PROPIONATE 50 MCG/ACT NA SUSP: 2.0000 | Freq: Every day | NASAL | Status: DC

## 2013-08-09 MED ORDER — TRAZODONE HCL 50 MG PO TABS: 75.0000 mg | ORAL_TABLET | Freq: Every day | ORAL | Status: DC | PRN

## 2013-08-09 MED ORDER — SERTRALINE HCL 100 MG PO TABS: 100.0000 mg | ORAL_TABLET | Freq: Every day | ORAL | Status: DC

## 2013-08-09 MED ORDER — CLONAZEPAM 2 MG PO TABS: 2.0000 mg | ORAL_TABLET | Freq: Three times a day (TID) | ORAL | Status: DC | PRN

## 2013-08-09 MED ORDER — CLOPIDOGREL BISULFATE 75 MG PO TABS: 75.0000 mg | ORAL_TABLET | Freq: Every day | ORAL | Status: DC

## 2013-08-09 MED ORDER — PANTOPRAZOLE SODIUM 40 MG PO TBEC: 40.0000 mg | DELAYED_RELEASE_TABLET | Freq: Every day | ORAL | Status: DC

## 2013-08-09 MED ORDER — ROSUVASTATIN CALCIUM 10 MG PO TABS: 10.0000 mg | ORAL_TABLET | Freq: Every day | ORAL | Status: DC

## 2013-08-09 MED ORDER — MECLIZINE HCL 25 MG PO TABS: 25.0000 mg | ORAL_TABLET | Freq: Three times a day (TID) | ORAL | Status: DC | PRN

## 2013-08-09 MED ORDER — GLIMEPIRIDE 2 MG PO TABS: 2.0000 mg | ORAL_TABLET | Freq: Every day | ORAL | Status: DC

## 2013-08-09 MED ORDER — ALPROSTADIL (VASODILATOR) 20 MCG IC KIT: 20.0000 ug | PACK | INTRACAVERNOUS | Status: DC | PRN

## 2013-08-09 MED ORDER — ISOSORBIDE MONONITRATE ER 60 MG PO TB24: 60.0000 mg | ORAL_TABLET | Freq: Every day | ORAL | Status: DC

## 2013-08-09 MED ORDER — METFORMIN HCL 1000 MG PO TABS: 1000.0000 mg | ORAL_TABLET | Freq: Two times a day (BID) | ORAL | Status: DC

## 2013-08-09 MED ORDER — LISINOPRIL 20 MG PO TABS: 20.0000 mg | ORAL_TABLET | Freq: Every day | ORAL | Status: DC

## 2013-08-09 MED ORDER — TIOTROPIUM BROMIDE MONOHYDRATE 18 MCG IN CAPS: 1.0000 | ORAL_CAPSULE | Freq: Once | RESPIRATORY_TRACT | Status: DC

## 2013-08-09 MED ORDER — BECLOMETHASONE DIPROPIONATE 80 MCG/ACT IN AERS: 1.0000 | INHALATION_SPRAY | RESPIRATORY_TRACT | Status: DC | PRN

## 2013-08-09 MED ORDER — SITAGLIPTIN PHOSPHATE 100 MG PO TABS: 100.0000 mg | ORAL_TABLET | Freq: Every day | ORAL | Status: DC

## 2013-08-09 MED ORDER — METOPROLOL SUCCINATE ER 50 MG PO TB24: 50.0000 mg | ORAL_TABLET | Freq: Every day | ORAL | Status: DC

## 2013-08-09 MED ORDER — ALBUTEROL SULFATE (2.5 MG/3ML) 0.083% IN NEBU: 2.5000 mg | INHALATION_SOLUTION | Freq: Four times a day (QID) | RESPIRATORY_TRACT | Status: DC | PRN

## 2013-08-09 NOTE — Progress Notes (Signed)
Subjective:    Patient ID: Derek Chen, male    DOB: 08-07-1960, 53 y.o.   MRN: [id number]  HPI Patient in today for follow up of chronic medical problems- He has been seen several times recently with c/o SOB and wanting to use his O2 all day instead of just at night. However his SAT level was 92-96% on room air. HE says that he is doing ok without daytime O2 but sometimes he will use it if gets to SOB- He does very little activies and no exercise.  Patient Active Problem List   Diagnosis Date Noted  . COPD, moderate 07/15/2013  . Type II or unspecified type diabetes mellitus without mention of complication, not stated as uncontrolled 11/06/2012  . BPH (benign prostatic hyperplasia) 11/06/2012  . 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 Palpitations 03/29/2012  . Dyspnea and respiratory abnormality 03/29/2012  . Breath shortness 03/29/2012  . Acute myocardial infarction, subendocardial infarction 03/27/2012  . Heart disease 03/27/2012  . 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 Coronary atherosclerosis 03/27/2012  . Acute non-ST segment elevation myocardial infarction 03/27/2012  . Left ventricular dysfunction 03/27/2012  . Anxiety 03/27/2012  . Essential hypertension, benign   . Coronary atherosclerosis of native coronary artery   . HYPERLIPIDEMIA 04/19/2010   Outpatient Encounter Prescriptions as of 08/09/2013  Medication Sig  . albuterol (PROVENTIL) (2.5 MG/3ML) 0.083% nebulizer solution Take 3 mLs (2.5 mg total) by nebulization every 6 (six) hours as needed for wheezing or shortness of breath.  . alprostadil (EDEX) 20 MCG injection 20 mcg by Intracavitary route as needed for erectile dysfunction. use no more than 3 times per week  . aspirin EC 81 MG tablet Take 81 mg by mouth daily.  . beclomethasone (QVAR) 80 MCG/ACT inhaler Inhale 1 puff into the lungs as needed.  . clonazePAM (KLONOPIN) 2 MG tablet Take 1 tablet (2 mg total) by mouth 3 (three) times daily as  needed for anxiety.  . clopidogrel (PLAVIX) 75 MG tablet Take 1 tablet (75 mg total) by mouth daily. Take by mouth. Take 75 mg by mouth daily.  . fluticasone (FLONASE) 50 MCG/ACT nasal spray Place 2 sprays into the nose daily.  Marland Kitchen glimepiride (AMARYL) 2 MG tablet Take 1 tablet (2 mg total) by mouth daily before breakfast.  . HYDROcodone-homatropine (HYCODAN) 5-1.5 MG/5ML syrup Take 5 mLs by mouth every 6 (six) hours as needed for cough.  . isosorbide mononitrate (IMDUR) 60 MG 24 hr tablet Take 1 tablet (60 mg total) by mouth daily.  Marland Kitchen lisinopril (PRINIVIL,ZESTRIL) 20 MG tablet Take 1 tablet (20 mg total) by mouth daily.  . meclizine (ANTIVERT) 25 MG tablet Take 1 tablet (25 mg total) by mouth 3 (three) times daily as needed for dizziness.  . metFORMIN (GLUCOPHAGE) 1000 MG tablet Take 1 tablet (1,000 mg total) by mouth 2 (two) times daily.  . metoprolol succinate (TOPROL-XL) 50 MG 24 hr tablet Take 1 tablet (50 mg total) by mouth daily. Take with or immediately following a meal.  . nitroGLYCERIN (NITROSTAT) 0.4 MG SL tablet Place 1 tablet (0.4 mg total) under the tongue every 5 (five) minutes as needed.  . pantoprazole (PROTONIX) 40 MG tablet Take 1 tablet (40 mg total) by mouth daily.  . rosuvastatin (CRESTOR) 10 MG tablet Take 1 tablet (10 mg total) by mouth daily.  . sertraline (ZOLOFT) 100 MG tablet Take 1 tablet (100 mg total) by mouth daily.  . sildenafil (VIAGRA) 100 MG tablet Take 1 tablet (100 mg  total) by mouth daily as needed for erectile dysfunction.  . sitaGLIPtin (JANUVIA) 100 MG tablet Take 1 tablet (100 mg total) by mouth daily.  Marland Kitchen tiotropium (SPIRIVA HANDIHALER) 18 MCG inhalation capsule Place 1 capsule (18 mcg total) into inhaler and inhale once. Place 1 capsule into inhaler and inh daily.  . traZODone (DESYREL) 50 MG tablet Take 1.5 tablets (75 mg total) by mouth daily as needed. For sleep  . [DISCONTINUED] benzonatate (TESSALON) 100 MG capsule Take 1 capsule (100 mg total) by  mouth 2 (two) times daily as needed for cough.       Review of Systems  Constitutional: Negative.   HENT: Negative.   Respiratory: Negative.   Cardiovascular: Negative.   Gastrointestinal: Negative.   Genitourinary: Negative.   Psychiatric/Behavioral: Negative.   All other systems reviewed and are negative.      Objective:   Physical Exam  Constitutional: He is oriented to person, place, and time. He appears well-developed and well-nourished.  HENT:  Head: Normocephalic.  Right Ear: External ear normal.  Left Ear: External ear normal.  Nose: Nose normal.  Mouth/Throat: Oropharynx is clear and moist.  Eyes: EOM are normal. Pupils are equal, round, and reactive to light.  Neck: Normal range of motion. Neck supple. No JVD present. No thyromegaly present.  Cardiovascular: Normal rate, regular rhythm, normal heart sounds and intact distal pulses.  Exam reveals no gallop and no friction rub.   No murmur heard. Pulmonary/Chest: Effort normal. No respiratory distress. He has wheezes (faint exp wheezes). He has no rales. He exhibits no tenderness.  Abdominal: Soft. Bowel sounds are normal. He exhibits no mass. There is no tenderness.  Genitourinary: Prostate normal and penis normal.  Musculoskeletal: Normal range of motion. He exhibits no edema.  Lymphadenopathy:    He has no cervical adenopathy.  Neurological: He is alert and oriented to person, place, and time. No cranial nerve deficit.  Skin: Skin is warm and dry.  Psychiatric: He has a normal mood and affect. His behavior is normal. Judgment and thought content normal.   BP 148/93  Pulse 69  Temp(Src) 98.4 F (36.9 C) (Oral)  Ht _0  (1.803 m)  Wt 196 lb (88.905 kg)  BMI 27.35 kg/m2  Results for orders placed in visit on 08/09/13  POCT GLYCOSYLATED HEMOGLOBIN (HGB A1C)      Result Value Ref Range   Hemoglobin A1C 6.5           Assessment & Plan:   1. Essential hypertension, benign   2. Heart disease   3.  HYPERLIPIDEMIA   4. Type II or unspecified type diabetes mellitus without mention of complication, not stated as uncontrolled   5. Secondary cardiomyopathy, unspecified   6. Hepatitis B antibody positive   7. Dyspnea and respiratory abnormality   8. COPD, moderate   9. Anxiety   10. Acute non-ST segment elevation myocardial infarction   11. CAD (coronary artery disease)   12. COPD bronchitis   13. Depression   14. Diabetes type 2, uncontrolled   15. GAD (generalized anxiety disorder)   16. GERD (gastroesophageal reflux disease)   17. Hyperlipidemia   18. Hypertension   19. Dizziness   20. Hypogonadism male    Orders Placed This Encounter  Procedures  . CMP14+EGFR  . NMR, lipoprofile  . Testosterone,Free and Total  . POCT glycosylated hemoglobin (Hb A1C)   Meds ordered this encounter  Medications  . isosorbide mononitrate (IMDUR) 60 MG 24 hr tablet  Sig: Take 1 tablet (60 mg total) by mouth daily.    Dispense:  90 tablet    Refill:  1    Order Specific Question:  Supervising Provider    Answer:  Chipper Herb [[id number]]  . beclomethasone (QVAR) 80 MCG/ACT inhaler    Sig: Inhale 1 puff into the lungs as needed.    Dispense:  3 Inhaler    Refill:  1    Order Specific Question:  Supervising Provider    Answer:  Chipper Herb [[id number]]  . tiotropium (SPIRIVA HANDIHALER) 18 MCG inhalation capsule    Sig: Place 1 capsule (18 mcg total) into inhaler and inhale once. Place 1 capsule into inhaler and inh daily.    Dispense:  90 capsule    Refill:  1    Order Specific Question:  Supervising Provider    Answer:  Chipper Herb [[id number]]  . fluticasone (FLONASE) 50 MCG/ACT nasal spray    Sig: Place 2 sprays into both nostrils daily.    Dispense:  16 g    Refill:  6    Order Specific Question:  Supervising Provider    Answer:  Chipper Herb [[id number]]  . sertraline (ZOLOFT) 100 MG tablet    Sig: Take 1 tablet (100 mg total) by mouth daily.    Dispense:  90 tablet    Refill:  1     Order Specific Question:  Supervising Provider    Answer:  Chipper Herb [[id number]]  . glimepiride (AMARYL) 2 MG tablet    Sig: Take 1 tablet (2 mg total) by mouth daily before breakfast.    Dispense:  90 tablet    Refill:  1    Order Specific Question:  Supervising Provider    Answer:  Chipper Herb [[id number]]  . metFORMIN (GLUCOPHAGE) 1000 MG tablet    Sig: Take 1 tablet (1,000 mg total) by mouth 2 (two) times daily.    Dispense:  180 tablet    Refill:  1    Order Specific Question:  Supervising Provider    Answer:  Lelon Perla [[id number]]  . clonazePAM (KLONOPIN) 2 MG tablet    Sig: Take 1 tablet (2 mg total) by mouth 3 (three) times daily as needed for anxiety.    Dispense:  90 tablet    Refill:  1    Order Specific Question:  Supervising Provider    Answer:  Chipper Herb [[id number]]  . metoprolol succinate (TOPROL-XL) 50 MG 24 hr tablet    Sig: Take 1 tablet (50 mg total) by mouth daily. Take with or immediately following a meal.    Dispense:  90 tablet    Refill:  1    Order Specific Question:  Supervising Provider    Answer:  Chipper Herb [[id number]]  . pantoprazole (PROTONIX) 40 MG tablet    Sig: Take 1 tablet (40 mg total) by mouth daily.    Dispense:  90 tablet    Refill:  1    Order Specific Question:  Supervising Provider    Answer:  Kirk Ruths S [[id number]]  . rosuvastatin (CRESTOR) 10 MG tablet    Sig: Take 1 tablet (10 mg total) by mouth daily.    Dispense:  90 tablet    Refill:  1    Order Specific Question:  Supervising Provider    Answer:  Chipper Herb [[id number]]  . sitaGLIPtin (JANUVIA) 100 MG tablet    Sig: Take 1 tablet (100 mg  total) by mouth daily.    Dispense:  90 tablet    Refill:  1    Order Specific Question:  Supervising Provider    Answer:  Chipper Herb [[id number]]  . lisinopril (PRINIVIL,ZESTRIL) 20 MG tablet    Sig: Take 1 tablet (20 mg total) by mouth daily.    Dispense:  90 tablet    Refill:  1    Order Specific Question:  Supervising Provider     Answer:  Chipper Herb [[id number]]  . albuterol (PROVENTIL) (2.5 MG/3ML) 0.083% nebulizer solution    Sig: Take 3 mLs (2.5 mg total) by nebulization every 6 (six) hours as needed for wheezing or shortness of breath.    Dispense:  75 mL    Refill:  3    Order Specific Question:  Supervising Provider    Answer:  Chipper Herb [[id number]]  . alprostadil (EDEX) 20 MCG injection    Sig: 20 mcg by Intracavitary route as needed for erectile dysfunction. use no more than 3 times per week    Dispense:  1 each    Refill:  5    Order Specific Question:  Supervising Provider    Answer:  Chipper Herb [[id number]]  . clopidogrel (PLAVIX) 75 MG tablet    Sig: Take 1 tablet (75 mg total) by mouth daily. Take by mouth. Take 75 mg by mouth daily.    Dispense:  90 tablet    Refill:  1    Order Specific Question:  Supervising Provider    Answer:  Chipper Herb [[id number]]  . traZODone (DESYREL) 50 MG tablet    Sig: Take 1.5 tablets (75 mg total) by mouth daily as needed. For sleep    Dispense:  90 tablet    Refill:  1    Order Specific Question:  Supervising Provider    Answer:  Chipper Herb [[id number]]  . meclizine (ANTIVERT) 25 MG tablet    Sig: Take 1 tablet (25 mg total) by mouth 3 (three) times daily as needed for dizziness.    Dispense:  30 tablet    Refill:  0    Order Specific Question:  Supervising Provider    Answer:  Chipper Herb [[id number]]    Labs pending Health maintenance reviewed Diet and exercise encouraged Continue all meds Follow up  In 3 months    Nora, FNP

## 2013-08-10 ENCOUNTER — Telehealth: Payer: Self-pay | Admitting: *Deleted

## 2013-08-10 MED ORDER — ALOGLIPTIN BENZOATE 25 MG PO TABS: 1.0000 | ORAL_TABLET | Freq: Every day | ORAL | Status: DC

## 2013-08-10 NOTE — Telephone Encounter (Signed)
please let patient know that insurance would not pay for [place] so we changed to nesina

## 2013-08-10 NOTE — Telephone Encounter (Signed)
MM, Ins wants history of trial resulting in therapeutic failure of Nesina, onglyza, and trajenta before considering januvia, janumet or janumet XR. Would either of the three work at all/  Thanks.

## 2013-08-11 ENCOUNTER — Telehealth: Payer: Self-pay | Admitting: Nurse Practitioner

## 2013-08-11 ENCOUNTER — Ambulatory Visit: Payer: 59 | Admitting: Nurse Practitioner

## 2013-08-11 NOTE — Telephone Encounter (Signed)
I called Derek Chen and told him what the situation is and that nesina had been sent in to his pharmacy and he said he would let us know if he has any unusual side effects.

## 2013-08-11 NOTE — Telephone Encounter (Signed)
Patient aware results not back

## 2013-08-12 LAB — CMP14+EGFR
ALBUMIN: 4.4 g/dL (ref 3.5–5.5)
ALT: 32 IU/L (ref 0–44)
AST: 22 IU/L (ref 0–40)
Albumin/Globulin Ratio: 1.9 (ref 1.1–2.5)
Alkaline Phosphatase: 61 IU/L (ref 39–117)
BUN/Creatinine Ratio: 15 (ref 9–20)
BUN: 14 mg/dL (ref 6–24)
CO2: 20 mmol/L (ref 18–29)
CREATININE: 0.93 mg/dL (ref 0.76–1.27)
Calcium: 9.4 mg/dL (ref 8.7–10.2)
Chloride: 98 mmol/L (ref 97–108)
GFR calc Af Amer: 109 mL/min/{1.73_m2} (ref >59)
GFR calc non Af Amer: 94 mL/min/{1.73_m2} (ref >59)
GLOBULIN, TOTAL: 2.3 g/dL (ref 1.5–4.5)
Glucose: 173 mg/dL — ABNORMAL HIGH (ref 65–99)
Potassium: 4.5 mmol/L (ref 3.5–5.2)
Sodium: 135 mmol/L (ref 134–144)
Total Bilirubin: 0.5 mg/dL (ref 0.0–1.2)
Total Protein: 6.7 g/dL (ref 6.0–8.5)

## 2013-08-12 LAB — TESTOSTERONE,FREE AND TOTAL
TESTOSTERONE: 212 ng/dL — AB (ref 348–1197)
Testosterone, Free: 3.3 pg/mL — ABNORMAL LOW (ref 7.2–24.0)

## 2013-08-12 LAB — NMR, LIPOPROFILE
CHOLESTEROL: 134 mg/dL (ref 100–199)
HDL Cholesterol by NMR: 28 mg/dL — ABNORMAL LOW (ref >39)
HDL PARTICLE NUMBER: 26 umol/L — AB (ref >=30.5)
LDL Particle Number: 1318 nmol/L — ABNORMAL HIGH (ref <1000)
LDL Size: 19.9 nm (ref >20.5)
LDLC SERPL CALC-MCNC: 71 mg/dL (ref 0–99)
LP-IR Score: 79 — ABNORMAL HIGH (ref <=45)
Small LDL Particle Number: 997 nmol/L — ABNORMAL HIGH (ref <=527)
Triglycerides by NMR: 174 mg/dL — ABNORMAL HIGH (ref 0–149)

## 2013-08-13 ENCOUNTER — Other Ambulatory Visit: Payer: Self-pay | Admitting: Nurse Practitioner

## 2013-08-13 ENCOUNTER — Telehealth: Payer: Self-pay | Admitting: *Deleted

## 2013-08-13 MED ORDER — TESTOSTERONE 12.5 MG/ACT (1%) TD GEL: 12.5000 mg | Freq: Every day | TRANSDERMAL | Status: DC

## 2013-08-13 NOTE — Telephone Encounter (Signed)
Aware. 

## 2013-08-18 ENCOUNTER — Other Ambulatory Visit: Payer: Self-pay | Admitting: *Deleted

## 2013-08-18 MED ORDER — TESTOSTERONE 20.25 MG/ACT (1.62%) TD GEL: TRANSDERMAL | Status: DC

## 2013-08-18 NOTE — Progress Notes (Signed)
Telephone call from [facility] [place] [place] drug Androgel 1% is not available ok per MMM to order 1.62% apply 1 pump to each shoulder daily. Order placed and Zadie Rhine working on prior approval.

## 2013-08-19 ENCOUNTER — Telehealth: Payer: Self-pay | Admitting: *Deleted

## 2013-08-19 NOTE — Telephone Encounter (Signed)
Ronalee Belts called back and said the ins co wanted [name] to let them know what we would give him in a shot and they will see if is covered.  I interpreted it as send in a scritp for testesterone  And see if it goes.  Can you do this or do we need to appeal denial?  Hadyn seems to be ok with the shot.  Thanks!

## 2013-08-19 NOTE — Telephone Encounter (Signed)
Ins co denied androgel pump saying the product is a plan exclusion and that he can call the # on back of his member card and they will discuss with him plan benefits.  He has been told this and knows to call us back and let us know what they will cover so we can get it ordered. Will keep you posted as I know.

## 2013-08-19 NOTE — Telephone Encounter (Signed)
His testosterone is not low enough for shot- see what topicals ins willl cover.

## 2013-08-20 NOTE — Telephone Encounter (Signed)
Ok will wait fo rhim to return call

## 2013-08-20 NOTE — Telephone Encounter (Signed)
I talked with Micchael this am, and explained the situation he is going to call ins and see if they will pay for any gel or on the skin medicine and let me know Tuesday.

## 2013-08-24 MED ORDER — TESTOSTERONE 50 MG/5GM (1%) TD GEL: 5.0000 g | Freq: Every day | TRANSDERMAL | Status: DC

## 2013-08-24 NOTE — Telephone Encounter (Signed)
Did you get this message on medication insurance will cover?

## 2013-08-24 NOTE — Telephone Encounter (Signed)
rx ready for pick up Make sure keeps area where med was used covered and away from children

## 2013-08-25 NOTE — Telephone Encounter (Signed)
Patient awear

## 2013-08-26 ENCOUNTER — Other Ambulatory Visit: Payer: Self-pay | Admitting: Nurse Practitioner

## 2013-08-26 MED ORDER — TESTOSTERONE 5 MG/24HR TD PT24: 1.0000 | MEDICATED_PATCH | Freq: Every day | TRANSDERMAL | Status: DC

## 2013-08-27 ENCOUNTER — Telehealth: Payer: Self-pay | Admitting: *Deleted

## 2013-08-27 NOTE — Telephone Encounter (Signed)
MM, [facility] called and said that the manufactor of the androderm patches no longer make 5mg  they make 4mg  and ins will cover.  I discussed with Abigail Butts and we told pharmacy to change to 4mg , HOPE this was OK.  He was there waiting on it.

## 2013-08-27 NOTE — Telephone Encounter (Signed)
4mg  will be finep

## 2013-08-31 NOTE — Telephone Encounter (Signed)
Done

## 2013-09-08 ENCOUNTER — Telehealth: Payer: Self-pay | Admitting: Nurse Practitioner

## 2013-09-10 NOTE — Telephone Encounter (Signed)
We do not do prednisone fr 90 days- he probably only wanted him to take for 7 days - he will need to contact their office.

## 2013-09-10 NOTE — Telephone Encounter (Signed)
He was given MMM response and he will contact other office.

## 2013-09-13 ENCOUNTER — Telehealth: Payer: Self-pay | Admitting: Nurse Practitioner

## 2013-09-13 NOTE — Telephone Encounter (Signed)
Patient aware no samples at the moment

## 2013-09-23 ENCOUNTER — Telehealth: Payer: Self-pay | Admitting: Nurse Practitioner

## 2013-09-24 NOTE — Telephone Encounter (Signed)
No samples patient

## 2013-10-04 ENCOUNTER — Telehealth: Payer: Self-pay | Admitting: Family Medicine

## 2013-10-05 NOTE — Telephone Encounter (Signed)
Patient needs you to add to the letter that mmm wrote him an rx for a cane and and limited driven?

## 2013-10-09 ENCOUNTER — Other Ambulatory Visit: Payer: Self-pay | Admitting: Nurse Practitioner

## 2013-10-09 DIAGNOSIS — R42 Dizziness and giddiness: Secondary | ICD-10-CM

## 2013-10-09 MED ORDER — BENZONATATE 100 MG PO CAPS: 100.0000 mg | ORAL_CAPSULE | Freq: Three times a day (TID) | ORAL | Status: DC | PRN

## 2013-10-09 MED ORDER — MECLIZINE HCL 25 MG PO TABS: 25.0000 mg | ORAL_TABLET | Freq: Three times a day (TID) | ORAL | Status: DC | PRN

## 2013-10-15 ENCOUNTER — Telehealth: Payer: Self-pay | Admitting: Nurse Practitioner

## 2013-10-15 NOTE — Telephone Encounter (Signed)
Patient aware no samples at this time

## 2013-10-25 NOTE — Telephone Encounter (Signed)
Was told by Caryl Pina that this had been take care of by another Dr.'s office.

## 2013-10-26 ENCOUNTER — Telehealth: Payer: Self-pay | Admitting: Nurse Practitioner

## 2013-10-26 DIAGNOSIS — E785 Hyperlipidemia, unspecified: Secondary | ICD-10-CM

## 2013-10-26 MED ORDER — SITAGLIPTIN PHOSPHATE 100 MG PO TABS: 100.0000 mg | ORAL_TABLET | Freq: Every day | ORAL | Status: DC

## 2013-10-26 MED ORDER — ROSUVASTATIN CALCIUM 10 MG PO TABS: 10.0000 mg | ORAL_TABLET | Freq: Every day | ORAL | Status: DC

## 2013-10-26 NOTE — Telephone Encounter (Signed)
Patient aware samples up front  

## 2013-11-02 ENCOUNTER — Telehealth: Payer: Self-pay | Admitting: Nurse Practitioner

## 2013-11-02 NOTE — Telephone Encounter (Signed)
No samples 

## 2013-11-09 ENCOUNTER — Other Ambulatory Visit: Payer: Self-pay | Admitting: Nurse Practitioner

## 2013-11-10 ENCOUNTER — Ambulatory Visit (INDEPENDENT_AMBULATORY_CARE_PROVIDER_SITE_OTHER): Payer: 59 | Admitting: Nurse Practitioner

## 2013-11-10 ENCOUNTER — Encounter (INDEPENDENT_AMBULATORY_CARE_PROVIDER_SITE_OTHER): Payer: Self-pay

## 2013-11-10 ENCOUNTER — Encounter: Payer: Self-pay | Admitting: Nurse Practitioner

## 2013-11-10 VITALS — BP 149/99 | HR 87 | Temp 98.3°F | Ht 71.0 in | Wt 200.0 lb

## 2013-11-10 DIAGNOSIS — E1165 Type 2 diabetes mellitus with hyperglycemia: Secondary | ICD-10-CM

## 2013-11-10 DIAGNOSIS — I200 Unstable angina: Secondary | ICD-10-CM

## 2013-11-10 DIAGNOSIS — I429 Cardiomyopathy, unspecified: Secondary | ICD-10-CM

## 2013-11-10 DIAGNOSIS — E785 Hyperlipidemia, unspecified: Secondary | ICD-10-CM

## 2013-11-10 DIAGNOSIS — F419 Anxiety disorder, unspecified: Secondary | ICD-10-CM

## 2013-11-10 DIAGNOSIS — Z713 Dietary counseling and surveillance: Secondary | ICD-10-CM

## 2013-11-10 DIAGNOSIS — K219 Gastro-esophageal reflux disease without esophagitis: Secondary | ICD-10-CM

## 2013-11-10 DIAGNOSIS — IMO0002 Reserved for concepts with insufficient information to code with codable children: Secondary | ICD-10-CM

## 2013-11-10 DIAGNOSIS — IMO0001 Reserved for inherently not codable concepts without codable children: Secondary | ICD-10-CM

## 2013-11-10 DIAGNOSIS — Z6827 Body mass index (BMI) 27.0-27.9, adult: Secondary | ICD-10-CM

## 2013-11-10 DIAGNOSIS — R0689 Other abnormalities of breathing: Secondary | ICD-10-CM

## 2013-11-10 DIAGNOSIS — I2510 Atherosclerotic heart disease of native coronary artery without angina pectoris: Secondary | ICD-10-CM

## 2013-11-10 DIAGNOSIS — R0989 Other specified symptoms and signs involving the circulatory and respiratory systems: Secondary | ICD-10-CM

## 2013-11-10 DIAGNOSIS — J449 Chronic obstructive pulmonary disease, unspecified: Secondary | ICD-10-CM

## 2013-11-10 DIAGNOSIS — R0600 Dyspnea, unspecified: Secondary | ICD-10-CM

## 2013-11-10 DIAGNOSIS — F3289 Other specified depressive episodes: Secondary | ICD-10-CM

## 2013-11-10 DIAGNOSIS — F329 Major depressive disorder, single episode, unspecified: Secondary | ICD-10-CM

## 2013-11-10 DIAGNOSIS — I10 Essential (primary) hypertension: Secondary | ICD-10-CM

## 2013-11-10 DIAGNOSIS — I25110 Atherosclerotic heart disease of native coronary artery with unstable angina pectoris: Secondary | ICD-10-CM

## 2013-11-10 DIAGNOSIS — E119 Type 2 diabetes mellitus without complications: Secondary | ICD-10-CM

## 2013-11-10 DIAGNOSIS — F411 Generalized anxiety disorder: Secondary | ICD-10-CM

## 2013-11-10 DIAGNOSIS — F32A Depression, unspecified: Secondary | ICD-10-CM

## 2013-11-10 DIAGNOSIS — R0609 Other forms of dyspnea: Secondary | ICD-10-CM

## 2013-11-10 LAB — POCT GLYCOSYLATED HEMOGLOBIN (HGB A1C): Hemoglobin A1C: 7.5

## 2013-11-10 MED ORDER — SILDENAFIL CITRATE 100 MG PO TABS: 100.0000 mg | ORAL_TABLET | Freq: Every day | ORAL | Status: DC | PRN

## 2013-11-10 MED ORDER — ALOGLIPTIN BENZOATE 25 MG PO TABS: 1.0000 | ORAL_TABLET | Freq: Every day | ORAL | Status: DC

## 2013-11-10 MED ORDER — TRAZODONE HCL 50 MG PO TABS: 75.0000 mg | ORAL_TABLET | Freq: Every day | ORAL | Status: DC | PRN

## 2013-11-10 MED ORDER — TESTOSTERONE 5 MG/24HR TD PT24: 90.0000 | MEDICATED_PATCH | Freq: Every day | TRANSDERMAL | Status: DC

## 2013-11-10 MED ORDER — PANTOPRAZOLE SODIUM 40 MG PO TBEC: 40.0000 mg | DELAYED_RELEASE_TABLET | Freq: Every day | ORAL | Status: DC

## 2013-11-10 MED ORDER — SERTRALINE HCL 100 MG PO TABS: 100.0000 mg | ORAL_TABLET | Freq: Every day | ORAL | Status: DC

## 2013-11-10 MED ORDER — GLIMEPIRIDE 2 MG PO TABS: 2.0000 mg | ORAL_TABLET | Freq: Every day | ORAL | Status: DC

## 2013-11-10 MED ORDER — ISOSORBIDE MONONITRATE ER 60 MG PO TB24: 60.0000 mg | ORAL_TABLET | Freq: Every day | ORAL | Status: DC

## 2013-11-10 MED ORDER — CLONAZEPAM 2 MG PO TABS: 2.0000 mg | ORAL_TABLET | Freq: Three times a day (TID) | ORAL | Status: DC | PRN

## 2013-11-10 MED ORDER — ROSUVASTATIN CALCIUM 10 MG PO TABS: 10.0000 mg | ORAL_TABLET | Freq: Every day | ORAL | Status: DC

## 2013-11-10 MED ORDER — CLOPIDOGREL BISULFATE 75 MG PO TABS: 75.0000 mg | ORAL_TABLET | Freq: Every day | ORAL | Status: DC

## 2013-11-10 MED ORDER — LISINOPRIL 20 MG PO TABS: 20.0000 mg | ORAL_TABLET | Freq: Every day | ORAL | Status: DC

## 2013-11-10 MED ORDER — ALBUTEROL SULFATE (2.5 MG/3ML) 0.083% IN NEBU: 2.5000 mg | INHALATION_SOLUTION | Freq: Four times a day (QID) | RESPIRATORY_TRACT | Status: DC | PRN

## 2013-11-10 MED ORDER — METOPROLOL SUCCINATE ER 50 MG PO TB24: 50.0000 mg | ORAL_TABLET | Freq: Every day | ORAL | Status: DC

## 2013-11-10 MED ORDER — METFORMIN HCL 1000 MG PO TABS: 1000.0000 mg | ORAL_TABLET | Freq: Two times a day (BID) | ORAL | Status: DC

## 2013-11-10 NOTE — Patient Instructions (Signed)

## 2013-11-10 NOTE — Progress Notes (Signed)
Subjective:    Patient ID: Derek Chen, male    DOB: March 23, 1961, 53 y.o.   MRN: [id number]  HPI Patient in today for follow up of mu;tiple medical problems- He is a very sickly man- uses O2 at home at night but claims that he needs all the time even though SAO2 does not indicate that. He is under a lot of stress because his wife is moving out. SHe says that she can no longer take care of him- He claims that he has no energy to do anything and that causes friction between them. 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 He is a diabetic and says that fasting blood sugars have been running around 130-150. No hypoglycemia.  Patient Active Problem List   Diagnosis Date Noted  . COPD, moderate 07/15/2013  . Type II or unspecified type diabetes mellitus without mention of complication, not stated as uncontrolled 11/06/2012  . BPH (benign prostatic hyperplasia) 11/06/2012  . 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 Palpitations 03/29/2012  . Dyspnea and respiratory abnormality 03/29/2012  . Breath shortness 03/29/2012  . Acute myocardial infarction, subendocardial infarction 03/27/2012  . Heart disease 03/27/2012  . 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 Coronary atherosclerosis 03/27/2012  . Acute non-ST segment elevation myocardial infarction 03/27/2012  . Left ventricular dysfunction 03/27/2012  . Anxiety 03/27/2012  . Essential hypertension, benign   . Coronary atherosclerosis of native coronary artery   . HYPERLIPIDEMIA 04/19/2010   Outpatient Encounter Prescriptions as of 11/10/2013  Medication Sig  . albuterol (PROVENTIL) (2.5 MG/3ML) 0.083% nebulizer solution Take 3 mLs (2.5 mg total) by nebulization every 6 (six) hours as needed for wheezing or shortness of breath.  . Alogliptin Benzoate (NESINA) 25 MG TABS Take 1 tablet by mouth daily.  Marland Kitchen alprostadil (EDEX) 20 MCG injection 20 mcg by Intracavitary route as needed for erectile dysfunction. use no  more than 3 times per week  . aspirin EC 81 MG tablet Take 81 mg by mouth daily.  . beclomethasone (QVAR) 80 MCG/ACT inhaler Inhale 1 puff into the lungs as needed.  . benzonatate (TESSALON) 100 MG capsule Take 1 capsule (100 mg total) by mouth 3 (three) times daily as needed for cough.  . clonazePAM (KLONOPIN) 2 MG tablet Take 1 tablet (2 mg total) by mouth 3 (three) times daily as needed for anxiety.  . clopidogrel (PLAVIX) 75 MG tablet Take 1 tablet (75 mg total) by mouth daily. Take by mouth. Take 75 mg by mouth daily.  . fluticasone (FLONASE) 50 MCG/ACT nasal spray Place 2 sprays into both nostrils daily.  Marland Kitchen glimepiride (AMARYL) 2 MG tablet Take 1 tablet (2 mg total) by mouth daily before breakfast.  . HYDROcodone-homatropine (HYCODAN) 5-1.5 MG/5ML syrup Take 5 mLs by mouth every 6 (six) hours as needed for cough.  . isosorbide mononitrate (IMDUR) 60 MG 24 hr tablet Take 1 tablet (60 mg total) by mouth daily.  Marland Kitchen lisinopril (PRINIVIL,ZESTRIL) 20 MG tablet Take 1 tablet (20 mg total) by mouth daily.  . meclizine (ANTIVERT) 25 MG tablet Take 1 tablet (25 mg total) by mouth 3 (three) times daily as needed for dizziness.  . metFORMIN (GLUCOPHAGE) 1000 MG tablet Take 1 tablet (1,000 mg total) by mouth 2 (two) times daily.  . metoprolol succinate (TOPROL-XL) 50 MG 24 hr tablet Take 1 tablet (50 mg total) by mouth daily. Take with or immediately following a meal.  . nitroGLYCERIN (NITROSTAT) 0.4 MG SL tablet Place  1 tablet (0.4 mg total) under the tongue every 5 (five) minutes as needed.  . pantoprazole (PROTONIX) 40 MG tablet Take 1 tablet (40 mg total) by mouth daily.  . rosuvastatin (CRESTOR) 10 MG tablet Take 1 tablet (10 mg total) by mouth daily.  . sertraline (ZOLOFT) 100 MG tablet Take 1 tablet (100 mg total) by mouth daily.  . sildenafil (VIAGRA) 100 MG tablet Take 1 tablet (100 mg total) by mouth daily as needed for erectile dysfunction.  . sitaGLIPtin (JANUVIA) 100 MG tablet Take 1 tablet  (100 mg total) by mouth daily.  Marland Kitchen testosterone (ANDRODERM) 5 MG/24HR Place 1 patch onto the skin daily.  Marland Kitchen tiotropium (SPIRIVA HANDIHALER) 18 MCG inhalation capsule Place 1 capsule (18 mcg total) into inhaler and inhale once. Place 1 capsule into inhaler and inh daily.  . traZODone (DESYREL) 50 MG tablet Take 1.5 tablets (75 mg total) by mouth daily as needed. For sleep       Review of Systems     Objective:   Physical Exam  Constitutional: He is oriented to person, place, and time. He appears well-developed and well-nourished.  HENT:  Head: Normocephalic.  Right Ear: External ear normal.  Left Ear: External ear normal.  Nose: Nose normal.  Mouth/Throat: Oropharynx is clear and moist.  Eyes: EOM are normal. Pupils are equal, round, and reactive to light.  Neck: Normal range of motion. Neck supple. No JVD present. No thyromegaly present.  Cardiovascular: Normal rate, regular rhythm, normal heart sounds and intact distal pulses.  Exam reveals no gallop and no friction rub.   No murmur heard. Pulmonary/Chest: Effort normal and breath sounds normal. No respiratory distress. He has no wheezes. He has no rales. He exhibits no tenderness.  Abdominal: Soft. Bowel sounds are normal. He exhibits no mass. There is no tenderness.  Genitourinary: Prostate normal and penis normal.  Musculoskeletal: Normal range of motion. He exhibits no edema.  Walking with cane- slow and steady  Lymphadenopathy:    He has no cervical adenopathy.  Neurological: He is alert and oriented to person, place, and time. No cranial nerve deficit.  Skin: Skin is warm and dry.  Psychiatric: He has a normal mood and affect. His behavior is normal. Judgment and thought content normal.   BP 149/99  Pulse 87  Temp(Src) 98.3 F (36.8 C) (Oral)  Ht '5\' 11"'  (1.803 m)  Wt 200 lb (90.719 kg)  BMI 27.91 kg/m2  Results for orders placed in visit on 11/10/13  POCT GLYCOSYLATED HEMOGLOBIN (HGB A1C)      Result Value Ref  Range   Hemoglobin A1C 7.5%           Assessment & Plan:   1. Essential hypertension, benign   2. HYPERLIPIDEMIA   3. Type II or unspecified type diabetes mellitus without mention of complication, not stated as uncontrolled   4. Secondary cardiomyopathy, unspecified   5. Dyspnea and respiratory abnormality   6. COPD, moderate   7. Anxiety   8. BMI 27.0-27.9,adult   9. Weight loss counseling, encounter for   10. Coronary artery disease involving native coronary artery of native heart with unstable angina pectoris   11. Depression   12. Diabetes type 2, uncontrolled   13. COPD bronchitis   14. GAD (generalized anxiety disorder)   15. Gastroesophageal reflux disease without esophagitis    Orders Placed This Encounter  Procedures  . CMP14+EGFR  . NMR, lipoprofile  . POCT glycosylated hemoglobin (Hb A1C)   Meds ordered this  encounter  Medications  . isosorbide mononitrate (IMDUR) 60 MG 24 hr tablet    Sig: Take 1 tablet (60 mg total) by mouth daily.    Dispense:  90 tablet    Refill:  1    Order Specific Question:  Supervising Provider    Answer:  Chipper Herb [[id number]]  . clopidogrel (PLAVIX) 75 MG tablet    Sig: Take 1 tablet (75 mg total) by mouth daily. Take by mouth. Take 75 mg by mouth daily.    Dispense:  90 tablet    Refill:  1    Order Specific Question:  Supervising Provider    Answer:  Chipper Herb [[id number]]  . sertraline (ZOLOFT) 100 MG tablet    Sig: Take 1 tablet (100 mg total) by mouth daily.    Dispense:  90 tablet    Refill:  1    Order Specific Question:  Supervising Provider    Answer:  Chipper Herb [[id number]]  . metFORMIN (GLUCOPHAGE) 1000 MG tablet    Sig: Take 1 tablet (1,000 mg total) by mouth 2 (two) times daily.    Dispense:  180 tablet    Refill:  1    Order Specific Question:  Supervising Provider    Answer:  Chipper Herb [[id number]]  . glimepiride (AMARYL) 2 MG tablet    Sig: Take 1 tablet (2 mg total) by mouth daily before breakfast.     Dispense:  90 tablet    Refill:  1    Order Specific Question:  Supervising Provider    Answer:  Chipper Herb [[id number]]  . albuterol (PROVENTIL) (2.5 MG/3ML) 0.083% nebulizer solution    Sig: Take 3 mLs (2.5 mg total) by nebulization every 6 (six) hours as needed for wheezing or shortness of breath.    Dispense:  75 mL    Refill:  3    Order Specific Question:  Supervising Provider    Answer:  Chipper Herb [[id number]]  . metoprolol succinate (TOPROL-XL) 50 MG 24 hr tablet    Sig: Take 1 tablet (50 mg total) by mouth daily. Take with or immediately following a meal.    Dispense:  90 tablet    Refill:  1    Order Specific Question:  Supervising Provider    Answer:  Chipper Herb [[id number]]  . pantoprazole (PROTONIX) 40 MG tablet    Sig: Take 1 tablet (40 mg total) by mouth daily.    Dispense:  90 tablet    Refill:  1    Order Specific Question:  Supervising Provider    Answer:  Chipper Herb [[id number]]  . rosuvastatin (CRESTOR) 10 MG tablet    Sig: Take 1 tablet (10 mg total) by mouth daily.    Dispense:  90 tablet    Refill:  1    Order Specific Question:  Supervising Provider    Answer:  Chipper Herb [[id number]]  . clonazePAM (KLONOPIN) 2 MG tablet    Sig: Take 1 tablet (2 mg total) by mouth 3 (three) times daily as needed for anxiety.    Dispense:  90 tablet    Refill:  1    Order Specific Question:  Supervising Provider    Answer:  Chipper Herb [[id number]]  . lisinopril (PRINIVIL,ZESTRIL) 20 MG tablet    Sig: Take 1 tablet (20 mg total) by mouth daily.    Dispense:  90 tablet    Refill:  1    Order Specific Question:  Supervising Provider    Answer:  Chipper Herb [[id number]]  . traZODone (DESYREL) 50 MG tablet    Sig: Take 1.5 tablets (75 mg total) by mouth daily as needed. For sleep    Dispense:  90 tablet    Refill:  1    Order Specific Question:  Supervising Provider    Answer:  Chipper Herb [[id number]]  . testosterone (ANDRODERM) 5 MG/24HR    Sig: Place 90 patches onto the skin  daily.    Dispense:  90 patch    Refill:  1    Order Specific Question:  Supervising Provider    Answer:  Chipper Herb [[id number]]  . sildenafil (VIAGRA) 100 MG tablet    Sig: Take 1 tablet (100 mg total) by mouth daily as needed for erectile dysfunction.    Dispense:  3 tablet    Refill:  0    Order Specific Question:  Supervising Provider    Answer:  Chipper Herb [[id number]]  . Alogliptin Benzoate (NESINA) 25 MG TABS    Sig: Take 1 tablet by mouth daily.    Dispense:  30 tablet    Refill:  3    Order Specific Question:  Supervising Provider    Answer:  Chipper Herb [[id number]]   Keep follow up with pulmonologist Labs pending Health maintenance reviewed Diet and exercise encouraged Continue all meds Follow up  In 3 months   Ezel, FNP

## 2013-11-11 LAB — CMP14+EGFR
ALT: 37 IU/L (ref 0–44)
AST: 24 IU/L (ref 0–40)
Albumin/Globulin Ratio: 1.8 (ref 1.1–2.5)
Albumin: 4.4 g/dL (ref 3.5–5.5)
Alkaline Phosphatase: 56 IU/L (ref 39–117)
BILIRUBIN TOTAL: 0.3 mg/dL (ref 0.0–1.2)
BUN/Creatinine Ratio: 19 (ref 9–20)
BUN: 17 mg/dL (ref 6–24)
CHLORIDE: 95 mmol/L — AB (ref 97–108)
CO2: 20 mmol/L (ref 18–29)
Calcium: 9.1 mg/dL (ref 8.7–10.2)
Creatinine, Ser: 0.89 mg/dL (ref 0.76–1.27)
GFR calc non Af Amer: 98 mL/min/{1.73_m2} (ref >59)
GFR, EST AFRICAN AMERICAN: 114 mL/min/{1.73_m2} (ref >59)
GLOBULIN, TOTAL: 2.5 g/dL (ref 1.5–4.5)
Glucose: 234 mg/dL — ABNORMAL HIGH (ref 65–99)
Potassium: 4.4 mmol/L (ref 3.5–5.2)
Sodium: 135 mmol/L (ref 134–144)
TOTAL PROTEIN: 6.9 g/dL (ref 6.0–8.5)

## 2013-11-11 LAB — NMR, LIPOPROFILE
Cholesterol: 221 mg/dL — ABNORMAL HIGH (ref 100–199)
HDL Cholesterol by NMR: 25 mg/dL — ABNORMAL LOW (ref >39)
HDL Particle Number: 20.1 umol/L — ABNORMAL LOW (ref >=30.5)
LDL PARTICLE NUMBER: 2293 nmol/L — AB (ref <1000)
LDL Size: 19.9 nm (ref >20.5)
LDLC SERPL CALC-MCNC: 139 mg/dL — ABNORMAL HIGH (ref 0–99)
LP-IR SCORE: 80 — AB (ref <=45)
Small LDL Particle Number: 1697 nmol/L — ABNORMAL HIGH (ref <=527)
TRIGLYCERIDES BY NMR: 283 mg/dL — AB (ref 0–149)

## 2013-11-29 ENCOUNTER — Telehealth: Payer: Self-pay | Admitting: Nurse Practitioner

## 2013-11-29 NOTE — Telephone Encounter (Signed)
Pt has hx of kidney stones. Explained that he would need to be evaluated before we would be able to provide pain medication. Appt scheduled for tomorrow morning.

## 2013-11-29 NOTE — Telephone Encounter (Signed)
Has to be seen  for pain meds 

## 2013-11-30 ENCOUNTER — Ambulatory Visit (INDEPENDENT_AMBULATORY_CARE_PROVIDER_SITE_OTHER): Payer: 59

## 2013-11-30 ENCOUNTER — Ambulatory Visit: Payer: 59 | Admitting: Urology

## 2013-11-30 ENCOUNTER — Ambulatory Visit (INDEPENDENT_AMBULATORY_CARE_PROVIDER_SITE_OTHER): Payer: 59 | Admitting: Family Medicine

## 2013-11-30 ENCOUNTER — Encounter: Payer: Self-pay | Admitting: Family Medicine

## 2013-11-30 VITALS — BP 116/87 | HR 93 | Temp 98.4°F | Ht 71.0 in | Wt 199.0 lb

## 2013-11-30 DIAGNOSIS — R109 Unspecified abdominal pain: Secondary | ICD-10-CM

## 2013-11-30 DIAGNOSIS — N200 Calculus of kidney: Secondary | ICD-10-CM

## 2013-11-30 MED ORDER — TAMSULOSIN HCL 0.4 MG PO CAPS: 0.4000 mg | ORAL_CAPSULE | Freq: Every day | ORAL | Status: DC

## 2013-11-30 MED ORDER — HYDROCODONE-ACETAMINOPHEN 10-325 MG PO TABS: 1.0000 | ORAL_TABLET | Freq: Four times a day (QID) | ORAL | Status: DC | PRN

## 2013-11-30 MED ORDER — ONDANSETRON 8 MG PO TBDP: 8.0000 mg | ORAL_TABLET | Freq: Three times a day (TID) | ORAL | Status: DC | PRN

## 2013-11-30 NOTE — Progress Notes (Signed)
   Subjective:    Patient ID: HEAVEN WANDELL, male    DOB: 07/19/1960, 53 y.o.   MRN: [id number]  HPI  This 53 y.o. male presents for evaluation of left flank pain and kidney .  Review of Systems    No chest pain, SOB, HA, dizziness, vision change, N/V, diarrhea, constipation, dysuria, urinary urgency or frequency, myalgias, arthralgias or rash.  Objective:   Physical Exam  Vital signs noted  Well developed well nourished male.  HEENT - Head atraumatic Normocephalic Respiratory - Lungs CTA bilateral Cardiac - RRR S1 and S2 without murmur GI - Abdomen soft Nontender and bs active x 4 Extremities - No edema. Neuro - Grossly intact.      Assessment & Plan:  Left flank pain - Plan: DG Abd 1 View, ondansetron (ZOFRAN ODT) 8 MG disintegrating tablet, HYDROcodone-acetaminophen (NORCO) 10-325 MG per tablet, tamsulosin (FLOMAX) 0.4 MG CAPS capsule, CANCELED: POCT urinalysis dipstick, CANCELED: POCT UA - Microscopic Only  Kidney stone - Plan: ondansetron (ZOFRAN ODT) 8 MG disintegrating tablet, HYDROcodone-acetaminophen (NORCO) 10-325 MG per tablet, tamsulosin (FLOMAX) 0.4 MG CAPS capsule  Lysbeth Penner FNP

## 2013-12-02 ENCOUNTER — Encounter: Payer: Self-pay | Admitting: *Deleted

## 2013-12-13 ENCOUNTER — Other Ambulatory Visit: Payer: Self-pay | Admitting: Nurse Practitioner

## 2013-12-13 ENCOUNTER — Telehealth: Payer: Self-pay | Admitting: Nurse Practitioner

## 2013-12-14 ENCOUNTER — Telehealth: Payer: Self-pay | Admitting: Nurse Practitioner

## 2013-12-14 NOTE — Telephone Encounter (Signed)
Patient aware we are out of samples.  

## 2013-12-14 NOTE — Telephone Encounter (Signed)
The clonazepam is not working and wants something stronger please - him and his wife are separating.

## 2013-12-14 NOTE — Telephone Encounter (Signed)
Patient aware that he is on the highest dose

## 2013-12-14 NOTE — Telephone Encounter (Signed)
He is on the highest dose of clonazapam- there is nothing stronger

## 2013-12-16 ENCOUNTER — Telehealth: Payer: Self-pay | Admitting: Family Medicine

## 2013-12-16 NOTE — Telephone Encounter (Signed)
Wants something calling in for a migraine something [facility] use to give him he can not remember the name he has tried ibp and it has not helped

## 2013-12-17 ENCOUNTER — Telehealth: Payer: Self-pay | Admitting: Nurse Practitioner

## 2013-12-19 NOTE — Telephone Encounter (Signed)
Need old chart to see what she gave him.

## 2013-12-20 MED ORDER — BUTALBITAL-APAP-CAFFEINE 50-325-40 MG PO TABS: 1.0000 | ORAL_TABLET | Freq: Two times a day (BID) | ORAL | Status: DC | PRN

## 2013-12-20 NOTE — Telephone Encounter (Signed)
He was prescribed Fioricet by CJ and had a couple of refills on it. He hasn't needed a new prescription until now.

## 2013-12-20 NOTE — Telephone Encounter (Signed)
rx ready for pickup 

## 2013-12-25 ENCOUNTER — Telehealth: Payer: Self-pay | Admitting: Nurse Practitioner

## 2013-12-25 ENCOUNTER — Other Ambulatory Visit: Payer: Self-pay | Admitting: Nurse Practitioner

## 2013-12-27 MED ORDER — BENZONATATE 100 MG PO CAPS: ORAL_CAPSULE | ORAL | Status: DC

## 2013-12-27 NOTE — Telephone Encounter (Signed)
A previous message says prescription up front for [facility].  There is no presc. Up front.  Call Darrelle at [phone]

## 2013-12-27 NOTE — Telephone Encounter (Signed)
Refill sent in for tessalon perles

## 2013-12-27 NOTE — Telephone Encounter (Signed)
He wants something for the head ache

## 2013-12-27 NOTE — Telephone Encounter (Signed)
Patient picked up rx but he has bronchitis so now he is coughing real bad and it makes his head hurt he wanted to know if he could get a refill?

## 2013-12-28 ENCOUNTER — Telehealth: Payer: Self-pay | Admitting: Nurse Practitioner

## 2013-12-28 NOTE — Telephone Encounter (Signed)
See telephone encounter from 12/28/13.

## 2013-12-28 NOTE — Telephone Encounter (Signed)
See telephone encounter from 12/25/13.  Advised to take ibuprofen 800mg  every 6 hrs PRN and to follow up if this doesn't help. Unable to prescribe Fioricet at this time because it was written on 9/21 for #30. Directions are to take 1 tablet BID PRN. With these directions it should last at least 2 weeks.  Patient stated understanding and will f/u if needed.

## 2013-12-28 NOTE — Telephone Encounter (Signed)
Motrin 800 mg q6 as needed- can get OTC and take 4 regular strength

## 2013-12-29 ENCOUNTER — Telehealth: Payer: Self-pay | Admitting: *Deleted

## 2013-12-29 NOTE — Telephone Encounter (Signed)
She says pt is having pretty severe migraines and is running out of fioricet too quickly. Could u increase his dose to tid, he just got 30 on 12/21/13 and is out

## 2013-12-30 ENCOUNTER — Other Ambulatory Visit: Payer: Self-pay | Admitting: Family Medicine

## 2013-12-30 ENCOUNTER — Telehealth: Payer: Self-pay | Admitting: Nurse Practitioner

## 2013-12-30 MED ORDER — BUTALBITAL-APAP-CAFFEINE 50-325-40 MG PO TABS: 1.0000 | ORAL_TABLET | Freq: Two times a day (BID) | ORAL | Status: DC | PRN

## 2013-12-30 NOTE — Telephone Encounter (Signed)
He can come in and pick up.  He just had this refilled a few weeks ago.  Taking too much fioricet can cause headaches so if this refill doesn't last more than a month then would follow up to discuss other tx options for headache.

## 2013-12-30 NOTE — Telephone Encounter (Signed)
Patient aware to come pick up

## 2013-12-30 NOTE — Telephone Encounter (Signed)
Patient wanted to know since mmm was out of town if you would refill his headache meds because she has not responded yet and he has seen you before?

## 2014-01-02 NOTE — Telephone Encounter (Signed)
NTBS to discuss 

## 2014-01-17 ENCOUNTER — Telehealth: Payer: Self-pay | Admitting: Nurse Practitioner

## 2014-01-17 NOTE — Telephone Encounter (Signed)
Patient has zofran rx

## 2014-01-18 ENCOUNTER — Other Ambulatory Visit: Payer: Self-pay | Admitting: Family Medicine

## 2014-01-18 ENCOUNTER — Telehealth: Payer: Self-pay | Admitting: Nurse Practitioner

## 2014-01-18 NOTE — Telephone Encounter (Signed)
Already addressed- fioricet was filled on10/1/15

## 2014-01-18 NOTE — Telephone Encounter (Signed)
Samples of januvia up front no crestor patient aware

## 2014-01-18 NOTE — Telephone Encounter (Signed)
What about his headache med?

## 2014-01-18 NOTE — Telephone Encounter (Signed)
No crestor

## 2014-01-19 ENCOUNTER — Ambulatory Visit (INDEPENDENT_AMBULATORY_CARE_PROVIDER_SITE_OTHER): Payer: 59

## 2014-01-19 DIAGNOSIS — Z23 Encounter for immunization: Secondary | ICD-10-CM

## 2014-01-19 NOTE — Telephone Encounter (Signed)
Last seen 11/30/13 B [place]

## 2014-01-20 ENCOUNTER — Telehealth: Payer: Self-pay | Admitting: Nurse Practitioner

## 2014-01-20 NOTE — Telephone Encounter (Signed)
appt with tammy soon - wait per mmm

## 2014-01-21 ENCOUNTER — Ambulatory Visit: Payer: Self-pay

## 2014-01-21 ENCOUNTER — Ambulatory Visit (INDEPENDENT_AMBULATORY_CARE_PROVIDER_SITE_OTHER): Payer: 59 | Admitting: Pharmacist

## 2014-01-21 ENCOUNTER — Encounter: Payer: Self-pay | Admitting: Pharmacist

## 2014-01-21 VITALS — BP 158/88 | HR 88 | Ht 71.0 in | Wt 201.0 lb

## 2014-01-21 DIAGNOSIS — E1169 Type 2 diabetes mellitus with other specified complication: Secondary | ICD-10-CM | POA: Insufficient documentation

## 2014-01-21 DIAGNOSIS — R7989 Other specified abnormal findings of blood chemistry: Secondary | ICD-10-CM

## 2014-01-21 DIAGNOSIS — N200 Calculus of kidney: Secondary | ICD-10-CM

## 2014-01-21 DIAGNOSIS — Z79899 Other long term (current) drug therapy: Secondary | ICD-10-CM

## 2014-01-21 DIAGNOSIS — E291 Testicular hypofunction: Secondary | ICD-10-CM

## 2014-01-21 DIAGNOSIS — N521 Erectile dysfunction due to diseases classified elsewhere: Secondary | ICD-10-CM

## 2014-01-21 DIAGNOSIS — R109 Unspecified abdominal pain: Secondary | ICD-10-CM

## 2014-01-21 MED ORDER — TAMSULOSIN HCL 0.4 MG PO CAPS: 0.4000 mg | ORAL_CAPSULE | Freq: Every day | ORAL | Status: DC

## 2014-01-21 NOTE — Progress Notes (Signed)
Patient ID: Derek Chen, male   DOB: 12/21/1960, 53 y.o.   MRN: [id number] Subjective:    [name] is a 53 y.o. male who presents for Medication Evaluation / Management  Our office received paperwork for patient assistance from [facility].  In reviewing paperwork determined that patient needed consult for polylpharmacy.     Preventive Screening-Counseling & Management  Tobacco History  Smoking status  . Former Smoker -- 1.00 packs/day for 28 years  . Types: Cigarettes  . Quit date: 05/08/2010  Smokeless tobacco  . Former Systems developer   Current Problems (verified) Patient Active Problem List   Diagnosis Date Noted  . Erectile dysfunction associated with type 2 diabetes mellitus 01/21/2014  . COPD, moderate 07/15/2013  . Type II or unspecified type diabetes mellitus without mention of complication, not stated as uncontrolled 11/06/2012  . BPH (benign prostatic hyperplasia) 11/06/2012  . 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 Palpitations 03/29/2012  . Dyspnea and respiratory abnormality 03/29/2012  . Breath shortness 03/29/2012  . Acute myocardial infarction, subendocardial infarction 03/27/2012  . Heart disease 03/27/2012  . 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 Coronary atherosclerosis 03/27/2012  . Acute non-ST segment elevation myocardial infarction 03/27/2012  . Left ventricular dysfunction 03/27/2012  . Anxiety 03/27/2012  . Essential hypertension, benign   . Coronary atherosclerosis of native coronary artery   . HYPERLIPIDEMIA 04/19/2010    Medications Prior to Visit Current Outpatient Prescriptions on File Prior to Visit  Medication Sig Dispense Refill  . albuterol (PROVENTIL) (2.5 MG/3ML) 0.083% nebulizer solution Take 3 mLs (2.5 mg total) by nebulization every 6 (six) hours as needed for wheezing or shortness of breath.  75 mL  3  . alprostadil (EDEX) 20 MCG injection 20 mcg by Intracavitary route as needed for erectile  dysfunction. use no more than 3 times per week  1 each  5  . aspirin EC 81 MG tablet Take 81 mg by mouth daily.      . benzonatate (TESSALON) 100 MG capsule TAKE ONE CAPSULE BY MOUTH THREE TIMES DAILY AS NEEDED FOR COUGH.  30 capsule  5  . butalbital-acetaminophen-caffeine (ESGIC) 50-325-40 MG per tablet Take 1 tablet by mouth 2 (two) times daily as needed for headache.  30 tablet  0  . clonazePAM (KLONOPIN) 2 MG tablet Take 1 tablet (2 mg total) by mouth 3 (three) times daily as needed for anxiety.  90 tablet  1  . clopidogrel (PLAVIX) 75 MG tablet Take 1 tablet (75 mg total) by mouth daily. Take by mouth. Take 75 mg by mouth daily.  90 tablet  1  . glimepiride (AMARYL) 2 MG tablet Take 1 tablet (2 mg total) by mouth daily before breakfast.  90 tablet  1  . isosorbide mononitrate (IMDUR) 60 MG 24 hr tablet Take 1 tablet (60 mg total) by mouth daily.  90 tablet  1  . lisinopril (PRINIVIL,ZESTRIL) 20 MG tablet Take 1 tablet (20 mg total) by mouth daily.  90 tablet  1  . meclizine (ANTIVERT) 25 MG tablet TAKE ONE TABLET BY MOUTH THREE TIMES DAILY AS NEEDED FOR DIZZINESS.  30 tablet  5  . metFORMIN (GLUCOPHAGE) 1000 MG tablet Take 1 tablet (1,000 mg total) by mouth 2 (two) times daily.  180 tablet  1  . metoprolol succinate (TOPROL-XL) 50 MG 24 hr tablet Take 1 tablet (50 mg total) by mouth daily. Take with or immediately following a meal.  90 tablet  1  . nitroGLYCERIN (NITROSTAT)  0.4 MG SL tablet Place 1 tablet (0.4 mg total) under the tongue every 5 (five) minutes as needed.  25 tablet  3  . ondansetron (ZOFRAN) 8 MG tablet TAKE ONE TABLET BY MOUTH EVERY 8 HOURS AS NEEDED FOR NAUSEA  20 tablet  0  . pantoprazole (PROTONIX) 40 MG tablet Take 1 tablet (40 mg total) by mouth daily.  90 tablet  1  . rosuvastatin (CRESTOR) 10 MG tablet Take 1 tablet (10 mg total) by mouth daily.  90 tablet  1  . sertraline (ZOLOFT) 100 MG tablet Take 1 tablet (100 mg total) by mouth daily.  90 tablet  1  . sitaGLIPtin  (JANUVIA) 100 MG tablet Take 1 tablet (100 mg total) by mouth daily.  28 tablet  0  . testosterone (ANDRODERM) 5 MG/24HR Place 90 patches onto the skin daily.  90 patch  1  . traZODone (DESYREL) 50 MG tablet Take 1.5 tablets (75 mg total) by mouth daily as needed. For sleep  90 tablet  1  . [DISCONTINUED] fluticasone (FLOVENT HFA) 110 MCG/ACT inhaler Inhale 2 puffs into the lungs 2 (two) times daily. Inhale 1 puff into the lungs 2 times daily.       No current facility-administered medications on file prior to visit.    Current Medications (verified) Current Outpatient Prescriptions  Medication Sig Dispense Refill  . albuterol (PROVENTIL) (2.5 MG/3ML) 0.083% nebulizer solution Take 3 mLs (2.5 mg total) by nebulization every 6 (six) hours as needed for wheezing or shortness of breath.  75 mL  3  . alprostadil (EDEX) 20 MCG injection 20 mcg by Intracavitary route as needed for erectile dysfunction. use no more than 3 times per week  1 each  5  . aspirin EC 81 MG tablet Take 81 mg by mouth daily.      . beclomethasone (QVAR) 80 MCG/ACT inhaler Inhale 1 puff into the lungs daily.      . benzonatate (TESSALON) 100 MG capsule TAKE ONE CAPSULE BY MOUTH THREE TIMES DAILY AS NEEDED FOR COUGH.  30 capsule  5  . budesonide (PULMICORT) 180 MCG/ACT inhaler Inhale 2 puffs into the lungs 2 (two) times daily.      . butalbital-acetaminophen-caffeine (ESGIC) 50-325-40 MG per tablet Take 1 tablet by mouth 2 (two) times daily as needed for headache.  30 tablet  0  . clonazePAM (KLONOPIN) 2 MG tablet Take 1 tablet (2 mg total) by mouth 3 (three) times daily as needed for anxiety.  90 tablet  1  . clopidogrel (PLAVIX) 75 MG tablet Take 1 tablet (75 mg total) by mouth daily. Take by mouth. Take 75 mg by mouth daily.  90 tablet  1  . fluticasone (FLONASE) 50 MCG/ACT nasal spray Place 2 sprays into both nostrils daily as needed.      Marland Kitchen glimepiride (AMARYL) 2 MG tablet Take 1 tablet (2 mg total) by mouth daily before  breakfast.  90 tablet  1  . isosorbide mononitrate (IMDUR) 60 MG 24 hr tablet Take 1 tablet (60 mg total) by mouth daily.  90 tablet  1  . lisinopril (PRINIVIL,ZESTRIL) 20 MG tablet Take 1 tablet (20 mg total) by mouth daily.  90 tablet  1  . meclizine (ANTIVERT) 25 MG tablet TAKE ONE TABLET BY MOUTH THREE TIMES DAILY AS NEEDED FOR DIZZINESS.  30 tablet  5  . metFORMIN (GLUCOPHAGE) 1000 MG tablet Take 1 tablet (1,000 mg total) by mouth 2 (two) times daily.  180 tablet  1  .  metoprolol succinate (TOPROL-XL) 50 MG 24 hr tablet Take 1 tablet (50 mg total) by mouth daily. Take with or immediately following a meal.  90 tablet  1  . nitroGLYCERIN (NITROSTAT) 0.4 MG SL tablet Place 1 tablet (0.4 mg total) under the tongue every 5 (five) minutes as needed.  25 tablet  3  . ondansetron (ZOFRAN) 8 MG tablet TAKE ONE TABLET BY MOUTH EVERY 8 HOURS AS NEEDED FOR NAUSEA  20 tablet  0  . pantoprazole (PROTONIX) 40 MG tablet Take 1 tablet (40 mg total) by mouth daily.  90 tablet  1  . rosuvastatin (CRESTOR) 10 MG tablet Take 1 tablet (10 mg total) by mouth daily.  90 tablet  1  . sertraline (ZOLOFT) 100 MG tablet Take 1 tablet (100 mg total) by mouth daily.  90 tablet  1  . sitaGLIPtin (JANUVIA) 100 MG tablet Take 1 tablet (100 mg total) by mouth daily.  28 tablet  0  . testosterone (ANDRODERM) 5 MG/24HR Place 90 patches onto the skin daily.  90 patch  1  . traZODone (DESYREL) 50 MG tablet Take 1.5 tablets (75 mg total) by mouth daily as needed. For sleep  90 tablet  1  . tamsulosin (FLOMAX) 0.4 MG CAPS capsule Take 1 capsule (0.4 mg total) by mouth daily.  30 capsule  0  . [DISCONTINUED] fluticasone (FLOVENT HFA) 110 MCG/ACT inhaler Inhale 2 puffs into the lungs 2 (two) times daily. Inhale 1 puff into the lungs 2 times daily.       No current facility-administered medications for this visit.     Allergies (verified) Cimetidine   PAST HISTORY  Family History Family History  Problem Relation Age of Onset   . Diabetes Mother   . Deep vein thrombosis Mother   . Pulmonary embolism Mother   . Cancer Father     hodgins lymphoma  . Diabetes Sister     Social History History  Substance Use Topics  . Smoking status: Former Smoker -- 1.00 packs/day for 28 years    Types: Cigarettes    Quit date: 05/08/2010  . Smokeless tobacco: Former Systems developer  . Alcohol Use: 1.0 - 1.5 oz/week    2-3 drink(s) per week     Comment: Drinks occasional beer and wine    List the Names of Other Physician/Practitioners you currently use: 1.  Cardiologist - Dr Eileen Stanford ([place]) 2.  Pulmonologist - Dr Bing Quarry [facility])  Indicate any recent Medical Services you may have received from other than [facility] providers in the past year (date may be approximate).   All answers were reviewed with the patient and necessary referrals were made:  Cherre Robins, [facility]   01/21/2014   History reviewed: allergies, current medications, past family history, past medical history, past social history, past surgical history and problem list    Objective:    Blood pressure 158/88, pulse 88, height 5\' 11"  (1.803 m), weight 201 lb (91.173 kg). Body mass index is 28.05 kg/(m^2).   Assessment:     Polypharmacy Medication Managment   Plan:   1.  Discontinue Viagra due to cardiac history of continue use of NTG and isosorbide.  2.  Patient reports taking AZO standard max daily for urinary hesitancy - recommended he discontinue 3.  Hold Spiriva - may worsen BPH / urinary hesitancy 4.  Restart flomax 0.4mg  1 capsule daily 5.  Discussed the possible increased risk of worsening heart disease with testosterone therapy with patient.  I  advised that he hold testosterone for the next month.  Recheck testosterone level along the prolactin/FSH and LH.   6.  Patient taking both QVAR and pulmicort.  Advised to use either of this since they are both corticosteroids.  Reviewed inhaler technique.  May continue  to use ventolin as needed for SOB. 7.  Educated patient about possible rebound HAs with chronic use of Esgic (butabitol, APAP,caffiene).  Advised to use only as needed. If continues to have daily HAs then consider referral to neurologist or headache clinic.  RTC in 1 month to see PCP  Cherre Robins, PharmD, CPP

## 2014-01-21 NOTE — Patient Instructions (Signed)
Hold testosterone for 1 month - will recheck testosterone level along with prolactin and LH/FSH  Stop spiriva - might be worsening urinary retention  Start tamsolusin 0.4mg  1 capsule daily for urinary retention

## 2014-01-27 ENCOUNTER — Other Ambulatory Visit: Payer: Self-pay | Admitting: Pharmacist

## 2014-01-27 MED ORDER — METOPROLOL TARTRATE 25 MG PO TABS: 25.0000 mg | ORAL_TABLET | Freq: Two times a day (BID) | ORAL | Status: DC

## 2014-01-27 MED ORDER — ALPROSTADIL (VASODILATOR) 20 MCG IC KIT: 20.0000 ug | PACK | INTRACAVERNOUS | Status: DC | PRN

## 2014-01-27 MED ORDER — NITROGLYCERIN 0.4 MG SL SUBL: 0.4000 mg | SUBLINGUAL_TABLET | SUBLINGUAL | Status: DC | PRN

## 2014-01-28 ENCOUNTER — Telehealth: Payer: Self-pay | Admitting: Pharmacist

## 2014-01-28 MED ORDER — BUTALBITAL-APAP-CAFFEINE 50-325-40 MG PO TABS: 1.0000 | ORAL_TABLET | Freq: Two times a day (BID) | ORAL | Status: DC | PRN

## 2014-01-28 NOTE — Telephone Encounter (Signed)
Ok'd refill with his PCP - Chevis Pretty, NP. Patient notified.

## 2014-02-03 ENCOUNTER — Telehealth: Payer: Self-pay | Admitting: Pharmacist

## 2014-02-03 NOTE — Telephone Encounter (Signed)
Patient called and wanted me to send in rx to Patient Assistance program for testosterone replacement.  I have explained to patient that he needs tobe off testosterone prior to getting labs next week - we need to check prolactin levels and LH/FSH.  I also continue to voice my concern about if he is an appropriate candidate for testosterone replacement.  I did not send in requested prescription.

## 2014-02-07 ENCOUNTER — Telehealth: Payer: Self-pay | Admitting: Nurse Practitioner

## 2014-02-08 NOTE — Telephone Encounter (Signed)
Samples up front for crestor out of other

## 2014-02-11 ENCOUNTER — Telehealth: Payer: Self-pay | Admitting: Family Medicine

## 2014-02-11 ENCOUNTER — Other Ambulatory Visit: Payer: Self-pay | Admitting: Family Medicine

## 2014-02-11 DIAGNOSIS — F411 Generalized anxiety disorder: Secondary | ICD-10-CM

## 2014-02-11 MED ORDER — CLONAZEPAM 2 MG PO TABS: 2.0000 mg | ORAL_TABLET | Freq: Three times a day (TID) | ORAL | Status: DC | PRN

## 2014-02-11 NOTE — Telephone Encounter (Signed)
He can p/u his clonazepam rx and refilled it

## 2014-02-11 NOTE — Telephone Encounter (Signed)
Called in for patient.

## 2014-02-11 NOTE — Telephone Encounter (Signed)
His step father just passed away this morning he was wanting to know if he could get a couple of nerve pills to get him through the weekend until mmm returns said that he is very upset?

## 2014-02-15 ENCOUNTER — Other Ambulatory Visit: Payer: Self-pay | Admitting: Nurse Practitioner

## 2014-02-15 NOTE — Telephone Encounter (Signed)
Patient saw a urologist years back.  He expected to get another script because he was helped with one on a prior visit.  He will call back if he wants a urology referral.

## 2014-02-15 NOTE — Telephone Encounter (Signed)
Needs to get from urologist

## 2014-02-17 ENCOUNTER — Ambulatory Visit (INDEPENDENT_AMBULATORY_CARE_PROVIDER_SITE_OTHER): Payer: 59 | Admitting: Nurse Practitioner

## 2014-02-17 ENCOUNTER — Telehealth: Payer: Self-pay | Admitting: Nurse Practitioner

## 2014-02-17 ENCOUNTER — Encounter: Payer: Self-pay | Admitting: Nurse Practitioner

## 2014-02-17 VITALS — BP 131/82 | HR 91 | Temp 97.5°F | Ht 71.0 in | Wt 195.0 lb

## 2014-02-17 DIAGNOSIS — F411 Generalized anxiety disorder: Secondary | ICD-10-CM

## 2014-02-17 DIAGNOSIS — F419 Anxiety disorder, unspecified: Secondary | ICD-10-CM

## 2014-02-17 DIAGNOSIS — E785 Hyperlipidemia, unspecified: Secondary | ICD-10-CM

## 2014-02-17 DIAGNOSIS — IMO0001 Reserved for inherently not codable concepts without codable children: Secondary | ICD-10-CM

## 2014-02-17 DIAGNOSIS — I25119 Atherosclerotic heart disease of native coronary artery with unspecified angina pectoris: Secondary | ICD-10-CM

## 2014-02-17 DIAGNOSIS — R0600 Dyspnea, unspecified: Secondary | ICD-10-CM

## 2014-02-17 DIAGNOSIS — R109 Unspecified abdominal pain: Secondary | ICD-10-CM

## 2014-02-17 DIAGNOSIS — J449 Chronic obstructive pulmonary disease, unspecified: Secondary | ICD-10-CM

## 2014-02-17 DIAGNOSIS — N400 Enlarged prostate without lower urinary tract symptoms: Secondary | ICD-10-CM

## 2014-02-17 DIAGNOSIS — F329 Major depressive disorder, single episode, unspecified: Secondary | ICD-10-CM

## 2014-02-17 DIAGNOSIS — I25110 Atherosclerotic heart disease of native coronary artery with unstable angina pectoris: Secondary | ICD-10-CM

## 2014-02-17 DIAGNOSIS — F32A Depression, unspecified: Secondary | ICD-10-CM

## 2014-02-17 DIAGNOSIS — I10 Essential (primary) hypertension: Secondary | ICD-10-CM

## 2014-02-17 DIAGNOSIS — N200 Calculus of kidney: Secondary | ICD-10-CM

## 2014-02-17 DIAGNOSIS — E1159 Type 2 diabetes mellitus with other circulatory complications: Secondary | ICD-10-CM

## 2014-02-17 DIAGNOSIS — R0689 Other abnormalities of breathing: Secondary | ICD-10-CM

## 2014-02-17 DIAGNOSIS — K219 Gastro-esophageal reflux disease without esophagitis: Secondary | ICD-10-CM

## 2014-02-17 LAB — POCT GLYCOSYLATED HEMOGLOBIN (HGB A1C): Hemoglobin A1C: 7.9

## 2014-02-17 LAB — POCT UA - MICROALBUMIN: Microalbumin Ur, POC: NEGATIVE mg/L

## 2014-02-17 MED ORDER — GLIMEPIRIDE 2 MG PO TABS
2.0000 mg | ORAL_TABLET | Freq: Two times a day (BID) | ORAL | Status: DC
Start: 2014-02-17 — End: 2014-03-08

## 2014-02-17 MED ORDER — BUTALBITAL-APAP-CAFFEINE 50-325-40 MG PO TABS: 1.0000 | ORAL_TABLET | Freq: Two times a day (BID) | ORAL | Status: DC | PRN

## 2014-02-17 NOTE — Patient Instructions (Signed)

## 2014-02-17 NOTE — Telephone Encounter (Signed)
Phenergan not on list has had zofran in the past.

## 2014-02-17 NOTE — Telephone Encounter (Signed)
Patient want to know if we can give him a rx for phenergan he forgot to ask today when he saw you. He states he is very nauseated and that he throws up every morning due to nerves.

## 2014-02-17 NOTE — Progress Notes (Signed)
Subjective:    Patient ID: Derek Chen, male    DOB: 06/12/1960, 53 y.o.   MRN: [id number]  HPI Patient in for follow up of chronic medical problems. He is doing ok today- has had kidney stone recently which he thinks he has passed- no c/o pain today. Patient Active Problem List   Diagnosis Date Noted  . Erectile dysfunction associated with type 2 diabetes mellitus 01/21/2014  . COPD, moderate 07/15/2013  . Diabetes 11/06/2012  . BPH (benign prostatic hyperplasia) 11/06/2012  . 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 Palpitations 03/29/2012  . Dyspnea and respiratory abnormality 03/29/2012  . Breath shortness 03/29/2012  . Acute myocardial infarction, subendocardial infarction 03/27/2012  . Heart disease 03/27/2012  . 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 Coronary atherosclerosis 03/27/2012  . Acute non-ST segment elevation myocardial infarction 03/27/2012  . Left ventricular dysfunction 03/27/2012  . Anxiety 03/27/2012  . Essential hypertension, benign   . Coronary atherosclerosis of native coronary artery   . Hyperlipidemia with target LDL less than 100 04/19/2010   Outpatient Encounter Prescriptions as of 02/17/2014  Medication Sig  . albuterol (PROVENTIL) (2.5 MG/3ML) 0.083% nebulizer solution Take 3 mLs (2.5 mg total) by nebulization every 6 (six) hours as needed for wheezing or shortness of breath.  . alprostadil (CAVERJECT IMPULSE) 20 MCG injection 20 mcg by Intracavitary route as needed for erectile dysfunction. use no more than 3 times per week  . aspirin EC 81 MG tablet Take 81 mg by mouth daily.  . beclomethasone (QVAR) 80 MCG/ACT inhaler Inhale 1 puff into the lungs daily.  . benzonatate (TESSALON) 100 MG capsule TAKE ONE CAPSULE BY MOUTH THREE TIMES DAILY AS NEEDED FOR COUGH.  . budesonide (PULMICORT) 180 MCG/ACT inhaler Inhale 2 puffs into the lungs 2 (two) times daily.  . butalbital-acetaminophen-caffeine (ESGIC) 50-325-40 MG per tablet Take 1  tablet by mouth 2 (two) times daily as needed for headache.  . clonazePAM (KLONOPIN) 2 MG tablet Take 1 tablet (2 mg total) by mouth 3 (three) times daily as needed for anxiety.  . clopidogrel (PLAVIX) 75 MG tablet Take 1 tablet (75 mg total) by mouth daily. Take by mouth. Take 75 mg by mouth daily.  . fluticasone (FLONASE) 50 MCG/ACT nasal spray Place 2 sprays into both nostrils daily as needed.  Marland Kitchen glimepiride (AMARYL) 2 MG tablet Take 1 tablet (2 mg total) by mouth daily before breakfast.  . isosorbide mononitrate (IMDUR) 60 MG 24 hr tablet Take 1 tablet (60 mg total) by mouth daily.  Marland Kitchen lisinopril (PRINIVIL,ZESTRIL) 20 MG tablet Take 1 tablet (20 mg total) by mouth daily.  . meclizine (ANTIVERT) 25 MG tablet TAKE ONE TABLET BY MOUTH THREE TIMES DAILY AS NEEDED FOR DIZZINESS.  . metFORMIN (GLUCOPHAGE) 1000 MG tablet Take 1 tablet (1,000 mg total) by mouth 2 (two) times daily.  . metoprolol tartrate (LOPRESSOR) 25 MG tablet Take 1 tablet (25 mg total) by mouth 2 (two) times daily.  . nitroGLYCERIN (NITROSTAT) 0.4 MG SL tablet Place 1 tablet (0.4 mg total) under the tongue every 5 (five) minutes as needed.  . ondansetron (ZOFRAN) 8 MG tablet TAKE ONE TABLET BY MOUTH EVERY 8 HOURS AS NEEDED FOR NAUSEA  . pantoprazole (PROTONIX) 40 MG tablet Take 1 tablet (40 mg total) by mouth daily.  . rosuvastatin (CRESTOR) 10 MG tablet Take 1 tablet (10 mg total) by mouth daily.  . sertraline (ZOLOFT) 100 MG tablet Take 1 tablet (100 mg total) by mouth daily.  . sitaGLIPtin (  JANUVIA) 100 MG tablet Take 1 tablet (100 mg total) by mouth daily.  . tamsulosin (FLOMAX) 0.4 MG CAPS capsule Take 1 capsule (0.4 mg total) by mouth daily.  Marland Kitchen testosterone (ANDRODERM) 5 MG/24HR Place 90 patches onto the skin daily.  . traZODone (DESYREL) 50 MG tablet Take 1.5 tablets (75 mg total) by mouth daily as needed. For sleep       Review of Systems  Constitutional: Negative.   HENT: Negative.   Respiratory: Positive for chest  tightness and shortness of breath.   Cardiovascular: Positive for chest pain (ocxcassional).  Gastrointestinal: Negative.   Genitourinary: Negative.   Musculoskeletal: Negative.   Neurological: Negative.   All other systems reviewed and are negative.      Objective:   Physical Exam  Constitutional: He is oriented to person, place, and time. He appears well-developed and well-nourished.  HENT:  Head: Normocephalic.  Right Ear: External ear normal.  Left Ear: External ear normal.  Nose: Nose normal.  Mouth/Throat: Oropharynx is clear and moist.  Eyes: EOM are normal. Pupils are equal, round, and reactive to light.  Neck: Normal range of motion. Neck supple. No JVD present. No thyromegaly present.  Cardiovascular: Normal rate, regular rhythm, normal heart sounds and intact distal pulses.  Exam reveals no gallop and no friction rub.   No murmur heard. Pulmonary/Chest: Effort normal and breath sounds normal. No respiratory distress. He has no wheezes. He has no rales. He exhibits no tenderness.  Abdominal: Soft. Bowel sounds are normal. He exhibits no mass. There is no tenderness.  Genitourinary: Prostate normal and penis normal.  Musculoskeletal: Normal range of motion. He exhibits no edema.  Lymphadenopathy:    He has no cervical adenopathy.  Neurological: He is alert and oriented to person, place, and time. No cranial nerve deficit.  Skin: Skin is warm and dry.  Psychiatric: He has a normal mood and affect. His behavior is normal. Judgment and thought content normal.    BP 131/82 mmHg  Pulse 91  Temp(Src) 97.5 F (36.4 C) (Oral)  Ht 5' 11" (1.803 m)  Wt 195 lb (88.451 kg)  BMI 27.21 kg/m2  Results for orders placed or performed in visit on 02/17/14  POCT glycosylated hemoglobin (Hb A1C)  Result Value Ref Range   Hemoglobin A1C 7.9%         Assessment & Plan:   1. Essential hypertension, benign   2. Atherosclerosis of native coronary artery of native heart with  angina pectoris   3. COPD, moderate   4. Type 2 diabetes mellitus with other circulatory complications   5. BPH (benign prostatic hyperplasia)   6. Hyperlipidemia with target LDL less than 100   7. Anxiety   8. Dyspnea and respiratory abnormality    Orders Placed This Encounter  Procedures  . CMP14+EGFR  . NMR, lipoprofile  . POCT glycosylated hemoglobin (Hb A1C)  . POCT UA - Microalbumin   Meds ordered this encounter  Medications  . butalbital-acetaminophen-caffeine (ESGIC) 50-325-40 MG per tablet    Sig: Take 1 tablet by mouth 2 (two) times daily as needed for headache.    Dispense:  30 tablet    Refill:  0    Order Specific Question:  Supervising Provider    Answer:  Chipper Herb [[id number]]  . glimepiride (AMARYL) 2 MG tablet    Sig: Take 1 tablet (2 mg total) by mouth 2 (two) times daily.    Dispense:  270 tablet    Refill:  1  Order Specific Question:  Supervising Provider    Answer:  Chipper Herb [[id number]]   Increased amaryl to 37m BID Keep diary of blood sugars All health maintenancece caught up Labs pending Watch diet- low fat RTO in 3 months follow up   [facility]ass FNP

## 2014-02-17 NOTE — Addendum Note (Signed)
Addended by: Earlene Plater on: 02/17/2014 10:16 AM   Modules accepted: Orders

## 2014-02-18 ENCOUNTER — Telehealth: Payer: Self-pay

## 2014-02-18 LAB — NMR, LIPOPROFILE
CHOLESTEROL: 140 mg/dL (ref 100–199)
HDL CHOLESTEROL BY NMR: 26 mg/dL — AB (ref >39)
HDL PARTICLE NUMBER: 22.5 umol/L — AB (ref >=30.5)
LDL PARTICLE NUMBER: 1543 nmol/L — AB (ref <1000)
LDL Size: 19.7 nm (ref >20.5)
LDL-C: 53 mg/dL (ref 0–99)
LP-IR SCORE: 75 — AB (ref <=45)
Small LDL Particle Number: 1273 nmol/L — ABNORMAL HIGH (ref <=527)
TRIGLYCERIDES BY NMR: 306 mg/dL — AB (ref 0–149)

## 2014-02-18 LAB — CMP14+EGFR
ALBUMIN: 4.8 g/dL (ref 3.5–5.5)
ALK PHOS: 58 IU/L (ref 39–117)
ALT: 33 IU/L (ref 0–44)
AST: 19 IU/L (ref 0–40)
Albumin/Globulin Ratio: 2.1 (ref 1.1–2.5)
BUN/Creatinine Ratio: 19 (ref 9–20)
BUN: 18 mg/dL (ref 6–24)
CO2: 22 mmol/L (ref 18–29)
CREATININE: 0.95 mg/dL (ref 0.76–1.27)
Calcium: 9.6 mg/dL (ref 8.7–10.2)
Chloride: 98 mmol/L (ref 97–108)
GFR calc Af Amer: 105 mL/min/{1.73_m2} (ref >59)
GFR calc non Af Amer: 91 mL/min/{1.73_m2} (ref >59)
GLOBULIN, TOTAL: 2.3 g/dL (ref 1.5–4.5)
GLUCOSE: 252 mg/dL — AB (ref 65–99)
Potassium: 4.7 mmol/L (ref 3.5–5.2)
Sodium: 136 mmol/L (ref 134–144)
TOTAL PROTEIN: 7.1 g/dL (ref 6.0–8.5)
Total Bilirubin: 0.3 mg/dL (ref 0.0–1.2)

## 2014-02-18 MED ORDER — ONDANSETRON HCL 8 MG PO TABS: 8.0000 mg | ORAL_TABLET | Freq: Three times a day (TID) | ORAL | Status: DC | PRN

## 2014-02-18 NOTE — Telephone Encounter (Signed)
Patient would like to get the zofran please

## 2014-02-18 NOTE — Telephone Encounter (Signed)
-----   Message from [facility] sent at 02/18/2014 12:38 PM EST ----- Hgba1c discussed at appointment- changes made at appointment Kidney and liver function stable Cholesterol improving- trig are still elevated- need to  Watch carbs closely Continue current meds- low fat diet and exercise and recheck in 3 months

## 2014-02-18 NOTE — Telephone Encounter (Signed)
Pt aware of lab results and need for recheck for 3 months

## 2014-02-22 ENCOUNTER — Other Ambulatory Visit: Payer: Self-pay | Admitting: Nurse Practitioner

## 2014-02-28 ENCOUNTER — Telehealth: Payer: Self-pay | Admitting: Family Medicine

## 2014-02-28 ENCOUNTER — Telehealth: Payer: Self-pay | Admitting: Nurse Practitioner

## 2014-02-28 MED ORDER — DULOXETINE HCL 60 MG PO CPEP: 60.0000 mg | ORAL_CAPSULE | Freq: Every day | ORAL | Status: DC

## 2014-02-28 NOTE — Telephone Encounter (Signed)
Please advise 

## 2014-02-28 NOTE — Telephone Encounter (Signed)
Patient says cardio doctor said the zoloft would affect his sexual functions.  He also told him metoprolol could interfere so should reduce down to 1/2 a pill on that.  He discussed that the clonazepam did not seem as effective and he had discussed that issue with you before.  Cardio doctor said patient would probably benefit from changing that to another medication also.  His main concern is to return to his normal sexual function level.  Cymbalta should not affect sexual function.

## 2014-02-28 NOTE — Telephone Encounter (Signed)
Why does he want to switch

## 2014-02-28 NOTE — Telephone Encounter (Signed)
zoloft changed to cymbalta

## 2014-03-01 NOTE — Telephone Encounter (Signed)
Patient aware script for cymbalta at [facility].

## 2014-03-03 ENCOUNTER — Encounter (INDEPENDENT_AMBULATORY_CARE_PROVIDER_SITE_OTHER): Payer: Self-pay

## 2014-03-03 ENCOUNTER — Encounter: Payer: Self-pay | Admitting: Family Medicine

## 2014-03-03 ENCOUNTER — Ambulatory Visit (INDEPENDENT_AMBULATORY_CARE_PROVIDER_SITE_OTHER): Payer: 59

## 2014-03-03 ENCOUNTER — Ambulatory Visit (INDEPENDENT_AMBULATORY_CARE_PROVIDER_SITE_OTHER): Payer: 59 | Admitting: Family Medicine

## 2014-03-03 ENCOUNTER — Other Ambulatory Visit: Payer: Self-pay | Admitting: *Deleted

## 2014-03-03 ENCOUNTER — Telehealth: Payer: Self-pay | Admitting: Family Medicine

## 2014-03-03 VITALS — BP 139/93 | HR 106 | Temp 98.0°F | Ht 71.0 in | Wt 190.4 lb

## 2014-03-03 DIAGNOSIS — M545 Low back pain: Secondary | ICD-10-CM

## 2014-03-03 DIAGNOSIS — M79605 Pain in left leg: Principal | ICD-10-CM

## 2014-03-03 MED ORDER — BUTALBITAL-APAP-CAFFEINE 50-325-40 MG PO TABS: 1.0000 | ORAL_TABLET | Freq: Two times a day (BID) | ORAL | Status: DC | PRN

## 2014-03-03 MED ORDER — OXYCODONE-ACETAMINOPHEN 5-325 MG PO TABS: 1.0000 | ORAL_TABLET | Freq: Three times a day (TID) | ORAL | Status: DC | PRN

## 2014-03-03 MED ORDER — METHYLPREDNISOLONE (PAK) 4 MG PO TABS: ORAL_TABLET | ORAL | Status: DC

## 2014-03-03 NOTE — Progress Notes (Signed)
   Subjective:    Patient ID: Derek Chen, male    DOB: 16-May-1960, 53 y.o.   MRN: [id number]  HPI Patient is here for c/o LBP.  He states he bent over and heard a pop and now he has left back pain.  Review of Systems  Constitutional: Negative for fever.  HENT: Negative for ear pain.   Eyes: Negative for discharge.  Respiratory: Negative for cough.   Cardiovascular: Negative for chest pain.  Gastrointestinal: Negative for abdominal distention.  Endocrine: Negative for polyuria.  Genitourinary: Negative for difficulty urinating.  Musculoskeletal: Negative for gait problem and neck pain.  Skin: Negative for color change and rash.  Neurological: Negative for speech difficulty and headaches.  Psychiatric/Behavioral: Negative for agitation.       Objective:    BP 139/93 mmHg  Pulse 106  Temp(Src) 98 F (36.7 C) (Oral)  Ht 5\' 11"  (1.803 m)  Wt 190 lb 6 oz (86.354 kg)  BMI 26.56 kg/m2 Physical Exam  Musculoskeletal:  TTP left LS muscles     Xray of LS spine - scoliosis  Prelimnary reading by Iverson Alamin     Assessment & Plan:     ICD-9-CM ICD-10-CM   1. LBP radiating to left leg 724.2 M54.5 DG Lumbar Spine 2-3 Views     methylPREDNIsolone (MEDROL DOSPACK) 4 MG tablet     oxyCODONE-acetaminophen (ROXICET) 5-325 MG per tablet     No Follow-up on file.  Lysbeth Penner FNP

## 2014-03-03 NOTE — Telephone Encounter (Signed)
Appointment for today at 21

## 2014-03-03 NOTE — Telephone Encounter (Signed)
Pt was being seen in office by Dietrich Pates at the time Dr.Moore ok'd this refill. The pt had requested a refill on this in the room and it had been declined by Dietrich Pates in the exam room as he was given an RX for oxycodone. Will close call.

## 2014-03-03 NOTE — Telephone Encounter (Signed)
This is okay to refill 1 

## 2014-03-03 NOTE — Telephone Encounter (Signed)
Patient of MMM. Last seen in office on 02-17-14. Rx last filled on 02-17-14 for #30. Please advise. If approved please route to Pool A so nurse can call patient to pick up. Rx will print

## 2014-03-07 ENCOUNTER — Telehealth: Payer: Self-pay | Admitting: Pharmacist

## 2014-03-07 ENCOUNTER — Other Ambulatory Visit: Payer: Self-pay | Admitting: Pharmacist

## 2014-03-07 ENCOUNTER — Other Ambulatory Visit: Payer: Self-pay | Admitting: Family Medicine

## 2014-03-07 ENCOUNTER — Other Ambulatory Visit: Payer: Self-pay | Admitting: Nurse Practitioner

## 2014-03-07 MED ORDER — BECLOMETHASONE DIPROPIONATE 80 MCG/ACT IN AERS: 1.0000 | INHALATION_SPRAY | Freq: Two times a day (BID) | RESPIRATORY_TRACT | Status: DC

## 2014-03-07 NOTE — Telephone Encounter (Signed)
I have discussed this with patient when he was in the room and explained that the percocet is an abortive med for headache and do not feel comfortable rx percocet and fioricet.  He then went to the front desk and asked them for a rx of fioricet which was then called back to me and I refused this again.  Now he is calling again and my answer is still no. Please explain that too much pain medicine and fioricet causes headaches.  Follow up with his PCP for headache medicine.  I will not rx him any.

## 2014-03-07 NOTE — Telephone Encounter (Signed)
Patient called because pharmacy he is getting clopidogrel and glimepiride was suppose to contact us about increasing quantities on Rx.  I do not see where they has sent [name] anything per his electronic record. We did send Rx for [facility] for #270 glimepiride 02/15/2014.  Other pharmacy ca ask for transfer. Patient to call to see if they will send information again to [name].  Patient also asked for Rx for Esgic / butabitol combo. It appear Rx was not given 03/03/2014 because Rx for oxycodone was given instead.  I mentioned this patient patient and he states that he has only taken #6 oxycodone and he does not wish to take such a stong pain reliever.  I will forward information to Dietrich Pates for review as he or other provider will have to determine if refill on butabitol at this point if appropriate.

## 2014-03-08 ENCOUNTER — Telehealth: Payer: Self-pay | Admitting: *Deleted

## 2014-03-08 ENCOUNTER — Other Ambulatory Visit: Payer: Self-pay | Admitting: *Deleted

## 2014-03-08 DIAGNOSIS — E1159 Type 2 diabetes mellitus with other circulatory complications: Secondary | ICD-10-CM

## 2014-03-08 MED ORDER — GLIMEPIRIDE 2 MG PO TABS: 2.0000 mg | ORAL_TABLET | Freq: Two times a day (BID) | ORAL | Status: DC

## 2014-03-08 NOTE — Telephone Encounter (Signed)
Per   B. Oxford, patient is aware he can't have refills on Fioricet.  Too much pain medication can cause rebound headaches.  Details given on patient's vm.

## 2014-03-08 NOTE — Telephone Encounter (Signed)
Patient was told he could not have any more refills on Fioricet.  This could cause rebound headaches.  He says he finished oxy pills and thought he could still get Fioricet. I explained he should find reason for headaches.  Food allergies?  Try keeping diary of what he eats, (avoid chocolate, cheese,wine).  Stress can cause headaches also, (try long showers, walks outside, reading, etc.).   He explained that he was depressed and needed something since his step-father died 3 weeks earlier.  I explained that he needed to try to cope with the loss and time could do more than any pill.  Coping is vital to make it through all that we have to endure that is sad or stressful.   He was offered a referral to a Headache clinic and papers with a listing of counselors and psychiatrist that he may get appointments with.  He will call back if he decides to use these resources.

## 2014-03-10 ENCOUNTER — Encounter (HOSPITAL_COMMUNITY): Payer: Self-pay | Admitting: Cardiovascular Disease

## 2014-03-14 ENCOUNTER — Telehealth: Payer: Self-pay | Admitting: Nurse Practitioner

## 2014-03-14 NOTE — Telephone Encounter (Signed)
Patient had this last filled on 11/19 he never receive rx on 12/3- it was approved by Dr. Laurance Flatten but was not given rx

## 2014-03-14 NOTE — Telephone Encounter (Signed)
Just had filled on 03/03/14- to soon to refill

## 2014-03-14 NOTE — Telephone Encounter (Signed)
Patient is requesting a refill on his fioricet. Patient states that he does not take use the percocet that was given to him by oxford. Please advise.

## 2014-03-14 NOTE — Telephone Encounter (Signed)
Patient aware of no refill on Fioricet.  He was upset and says he will come for his records.  He was telling me that Rockne Coons or Dr. Laurance Flatten should call him back.  Willits Glass blower/designer aware of this.

## 2014-03-15 ENCOUNTER — Other Ambulatory Visit: Payer: Self-pay | Admitting: Nurse Practitioner

## 2014-03-15 MED ORDER — BUTALBITAL-APAP-CAFFEINE 50-325-40 MG PO TABS: 1.0000 | ORAL_TABLET | Freq: Two times a day (BID) | ORAL | Status: DC | PRN

## 2014-03-15 NOTE — Telephone Encounter (Signed)
rx ready for pickup 

## 2014-03-15 NOTE — Telephone Encounter (Signed)
Talked with Denyse Amass about Cymbalta and Flomax

## 2014-03-15 NOTE — Telephone Encounter (Signed)
Patient aware rx ready to be picked up 

## 2014-03-17 ENCOUNTER — Other Ambulatory Visit: Payer: Self-pay | Admitting: Pharmacist

## 2014-03-17 MED ORDER — TAMSULOSIN HCL 0.4 MG PO CAPS: 0.4000 mg | ORAL_CAPSULE | Freq: Every day | ORAL | Status: DC

## 2014-03-17 MED ORDER — DULOXETINE HCL 60 MG PO CPEP: 60.0000 mg | ORAL_CAPSULE | Freq: Every day | ORAL | Status: DC

## 2014-03-17 NOTE — Telephone Encounter (Signed)
Discussed with Shelah Lewandowsky and she is [place] with patient changing back to Cymbalta from sertraline.  Rx printed and given to Zadie Rhine to send to patient assistance program / Denyse Amass.

## 2014-03-29 ENCOUNTER — Other Ambulatory Visit: Payer: Self-pay | Admitting: Nurse Practitioner

## 2014-03-29 NOTE — Telephone Encounter (Signed)
Just had filled on 03/15/14- yo early to get filled.

## 2014-04-04 ENCOUNTER — Telehealth: Payer: Self-pay | Admitting: Nurse Practitioner

## 2014-04-04 DIAGNOSIS — F411 Generalized anxiety disorder: Secondary | ICD-10-CM

## 2014-04-05 MED ORDER — BUTALBITAL-APAP-CAFFEINE 50-325-40 MG PO TABS: 1.0000 | ORAL_TABLET | Freq: Two times a day (BID) | ORAL | Status: DC | PRN

## 2014-04-05 MED ORDER — CLONAZEPAM 2 MG PO TABS: 2.0000 mg | ORAL_TABLET | Freq: Three times a day (TID) | ORAL | Status: DC | PRN

## 2014-04-05 MED ORDER — ZOLPIDEM TARTRATE 10 MG PO TABS: 10.0000 mg | ORAL_TABLET | Freq: Every evening | ORAL | Status: DC | PRN

## 2014-04-05 NOTE — Telephone Encounter (Signed)
Pt is requesting an RX for [employer] as his trazodone is no longer working and if he takes 2 of them his mouth gets so dry in the morning he starts heaving. He states he has taken an [place] and he slept like a rock and didn't have any ill side effects. Please advise.

## 2014-04-05 NOTE — Telephone Encounter (Signed)
rx ready for pickup 

## 2014-04-05 NOTE — Telephone Encounter (Signed)
Aware ,script ready. 

## 2014-05-16 ENCOUNTER — Other Ambulatory Visit: Payer: Self-pay | Admitting: Nurse Practitioner

## 2014-05-26 ENCOUNTER — Ambulatory Visit: Payer: 59 | Admitting: Nurse Practitioner

## 2014-06-20 ENCOUNTER — Encounter (INDEPENDENT_AMBULATORY_CARE_PROVIDER_SITE_OTHER): Payer: Self-pay | Admitting: *Deleted

## 2014-06-29 ENCOUNTER — Other Ambulatory Visit: Payer: Self-pay | Admitting: *Deleted

## 2014-06-29 DIAGNOSIS — I25110 Atherosclerotic heart disease of native coronary artery with unstable angina pectoris: Secondary | ICD-10-CM

## 2014-06-29 MED ORDER — ISOSORBIDE MONONITRATE ER 60 MG PO TB24: 60.0000 mg | ORAL_TABLET | Freq: Every day | ORAL | Status: DC

## 2014-08-10 ENCOUNTER — Other Ambulatory Visit: Payer: Self-pay | Admitting: Family Medicine

## 2014-08-10 DIAGNOSIS — N6320 Unspecified lump in the left breast, unspecified quadrant: Secondary | ICD-10-CM

## 2014-08-10 DIAGNOSIS — N62 Hypertrophy of breast: Secondary | ICD-10-CM

## 2014-08-15 ENCOUNTER — Ambulatory Visit
Admission: RE | Admit: 2014-08-15 | Discharge: 2014-08-15 | Disposition: A | Discharge status: Home / Self Care | Payer: 59 | Source: Ambulatory Visit | Attending: Family Medicine | Admitting: Family Medicine

## 2014-08-15 DIAGNOSIS — N62 Hypertrophy of breast: Secondary | ICD-10-CM

## 2014-08-15 DIAGNOSIS — N6320 Unspecified lump in the left breast, unspecified quadrant: Secondary | ICD-10-CM

## 2014-11-02 ENCOUNTER — Other Ambulatory Visit (HOSPITAL_COMMUNITY): Payer: Self-pay | Admitting: Neurology

## 2014-11-02 DIAGNOSIS — M5416 Radiculopathy, lumbar region: Secondary | ICD-10-CM

## 2014-11-15 ENCOUNTER — Ambulatory Visit (HOSPITAL_COMMUNITY)
Admission: RE | Admit: 2014-11-15 | Discharge: 2014-11-15 | Disposition: A | Discharge status: Home / Self Care | Payer: 59 | Source: Ambulatory Visit | Attending: Neurology | Admitting: Neurology

## 2014-11-15 DIAGNOSIS — M5136 Other intervertebral disc degeneration, lumbar region: Secondary | ICD-10-CM | POA: Insufficient documentation

## 2014-11-15 DIAGNOSIS — M545 Low back pain: Secondary | ICD-10-CM | POA: Insufficient documentation

## 2014-11-15 DIAGNOSIS — M5416 Radiculopathy, lumbar region: Secondary | ICD-10-CM

## 2014-12-19 ENCOUNTER — Other Ambulatory Visit: Payer: Self-pay | Admitting: Nurse Practitioner

## 2014-12-20 ENCOUNTER — Telehealth: Payer: Self-pay | Admitting: Family Medicine

## 2014-12-20 ENCOUNTER — Other Ambulatory Visit: Payer: Self-pay | Admitting: Nurse Practitioner

## 2014-12-20 NOTE — Telephone Encounter (Signed)
Last seen 03/04/15  B Oxford

## 2014-12-20 NOTE — Telephone Encounter (Signed)
Spoke with pharmacy and advised them that patient will need to be seen.

## 2014-12-20 NOTE — Telephone Encounter (Signed)
Last seen 03/03/14 B Oxford  Last A1c 02/17/14  Requesting 90 day supply

## 2014-12-26 ENCOUNTER — Other Ambulatory Visit: Payer: Self-pay | Admitting: Nurse Practitioner

## 2014-12-28 NOTE — Telephone Encounter (Signed)
Last seen 03/03/14  B Oxford  Requesting 90 day supply

## 2015-01-12 ENCOUNTER — Telehealth: Payer: Self-pay | Admitting: Nurse Practitioner

## 2015-03-27 ENCOUNTER — Other Ambulatory Visit: Payer: Self-pay | Admitting: Family Medicine

## 2015-03-28 NOTE — Telephone Encounter (Signed)
Last seen 02/18/14

## 2015-03-28 NOTE — Telephone Encounter (Signed)
Last refill without being seen 

## 2015-04-17 ENCOUNTER — Encounter (INDEPENDENT_AMBULATORY_CARE_PROVIDER_SITE_OTHER): Payer: Self-pay | Admitting: *Deleted

## 2015-05-15 ENCOUNTER — Encounter (INDEPENDENT_AMBULATORY_CARE_PROVIDER_SITE_OTHER): Payer: Self-pay | Admitting: *Deleted

## 2015-05-16 ENCOUNTER — Other Ambulatory Visit (INDEPENDENT_AMBULATORY_CARE_PROVIDER_SITE_OTHER): Payer: Self-pay | Admitting: *Deleted

## 2015-05-16 DIAGNOSIS — Z1211 Encounter for screening for malignant neoplasm of colon: Secondary | ICD-10-CM

## 2015-06-05 ENCOUNTER — Encounter (HOSPITAL_COMMUNITY): Payer: Self-pay | Admitting: *Deleted

## 2015-06-05 ENCOUNTER — Emergency Department (HOSPITAL_COMMUNITY): Payer: Medicare Other

## 2015-06-05 ENCOUNTER — Emergency Department (HOSPITAL_COMMUNITY)
Admission: EM | Admit: 2015-06-05 | Discharge: 2015-06-05 | Disposition: A | Discharge status: Home / Self Care | Payer: Medicare Other | Attending: Emergency Medicine | Admitting: Emergency Medicine

## 2015-06-05 DIAGNOSIS — Z79899 Other long term (current) drug therapy: Secondary | ICD-10-CM | POA: Diagnosis not present

## 2015-06-05 DIAGNOSIS — Z79891 Long term (current) use of opiate analgesic: Secondary | ICD-10-CM | POA: Insufficient documentation

## 2015-06-05 DIAGNOSIS — I252 Old myocardial infarction: Secondary | ICD-10-CM | POA: Insufficient documentation

## 2015-06-05 DIAGNOSIS — Z7901 Long term (current) use of anticoagulants: Secondary | ICD-10-CM | POA: Diagnosis not present

## 2015-06-05 DIAGNOSIS — Z87891 Personal history of nicotine dependence: Secondary | ICD-10-CM | POA: Insufficient documentation

## 2015-06-05 DIAGNOSIS — Z7984 Long term (current) use of oral hypoglycemic drugs: Secondary | ICD-10-CM | POA: Diagnosis not present

## 2015-06-05 DIAGNOSIS — J42 Unspecified chronic bronchitis: Secondary | ICD-10-CM | POA: Diagnosis not present

## 2015-06-05 DIAGNOSIS — Z7982 Long term (current) use of aspirin: Secondary | ICD-10-CM | POA: Insufficient documentation

## 2015-06-05 DIAGNOSIS — J410 Simple chronic bronchitis: Secondary | ICD-10-CM

## 2015-06-05 DIAGNOSIS — I10 Essential (primary) hypertension: Secondary | ICD-10-CM | POA: Insufficient documentation

## 2015-06-05 DIAGNOSIS — E119 Type 2 diabetes mellitus without complications: Secondary | ICD-10-CM | POA: Insufficient documentation

## 2015-06-05 DIAGNOSIS — I2510 Atherosclerotic heart disease of native coronary artery without angina pectoris: Secondary | ICD-10-CM | POA: Diagnosis not present

## 2015-06-05 DIAGNOSIS — R05 Cough: Secondary | ICD-10-CM | POA: Diagnosis present

## 2015-06-05 DIAGNOSIS — J40 Bronchitis, not specified as acute or chronic: Secondary | ICD-10-CM

## 2015-06-05 HISTORY — DX: Acute myocardial infarction, unspecified: I21.9

## 2015-06-05 MED ORDER — HYDROCODONE-ACETAMINOPHEN 5-325 MG PO TABS
2.0000 | ORAL_TABLET | Freq: Once | ORAL | Status: AC
Start: 2015-06-05 — End: 2015-06-05
  Administered 2015-06-05: 2 via ORAL
  Filled 2015-06-05: qty 2

## 2015-06-05 MED ORDER — AZITHROMYCIN 250 MG PO TABS: 250.0000 mg | ORAL_TABLET | Freq: Every day | ORAL | Status: DC

## 2015-06-05 MED ORDER — HYDROCODONE-ACETAMINOPHEN 5-325 MG PO TABS: 2.0000 | ORAL_TABLET | ORAL | Status: DC | PRN

## 2015-06-05 MED ORDER — PREDNISONE 10 MG PO TABS: ORAL_TABLET | ORAL | Status: DC

## 2015-06-05 NOTE — Discharge Instructions (Signed)
Chronic Bronchitis Chronic bronchitis is a lasting inflammation of the bronchial tubes, which are the tubes that carry air into your lungs. This is inflammation that occurs:   On most days of the week.   For at least three months at a time.   Over a period of two years in a row. When the bronchial tubes are inflamed, they start to produce mucus. The inflammation and buildup of mucus make it more difficult to breathe. Chronic bronchitis is usually a permanent problem and is one type of chronic obstructive pulmonary disease (COPD). People with chronic bronchitis are at greater risk for getting repeated colds, or respiratory infections. CAUSES  Chronic bronchitis most often occurs in people who have:  Long-standing, severe asthma.  A history of smoking.  Asthma and who also smoke. SIGNS AND SYMPTOMS  Chronic bronchitis may cause the following:   A cough that brings up mucus (productive cough).  Shortness of breath.  Early morning headache.  Wheezing.  Chest discomfort.   Recurring respiratory infections. DIAGNOSIS  Your health care provider may confirm the diagnosis by:  Taking your medical history.  Performing a physical exam.  Taking a chest X-ray.   Performing pulmonary function tests. TREATMENT  Treatment involves controlling symptoms with medicines, oxygen therapy, or making lifestyle changes, such as exercising and eating a healthy, well-balanced diet. Medicines could include:  Inhalers to improve air flow in and out of your lungs.  Antibiotics to treat bacterial infections, such as pneumonia, sinus infections, and acute bronchitis. As a preventative measure, your health care provider may recommend routine vaccinations for influenza and pneumonia. This is to prevent infection and hospitalization since you may be more at risk for these types of infections.  HOME CARE INSTRUCTIONS 1. Take medicines only as directed by your health care provider.  2. If you  smoke cigarettes, chew tobacco, or use electronic cigarettes, quit. If you need help quitting, ask your health care provider. 3. Avoid pollen, dust, animal dander, molds, smoke, and other things that cause shortness of breath or wheezing attacks. 4. Talk to your health care provider about possible exercise routines. Regular exercise is very important to help you feel better. 5. If you are prescribed oxygen use at home follow these guidelines: 1. Never smoke while using oxygen. Oxygen does not burn or explode, but flammable materials will burn faster in the presence of oxygen. 2. Keep a fire extinguisher close by. Let your fire department know that you have oxygen in your home. 3. Warn visitors not to smoke near you when you are using oxygen. Put up "no smoking" signs in your home where you most often use the oxygen. 4. Regularly test your smoke detectors at home to make sure they work. If you receive care in your home from a nurse or other health care provider, he or she may also check to make sure your smoke detectors work. 6. Ask your health care provider whether you would benefit from a pulmonary rehabilitation program. 7. Do not wait to get medical care if you have any concerning symptoms. Delays could cause permanent injury and may be life threatening. SEEK MEDICAL CARE IF:  You have increased coughing or shortness of breath or both.  You have muscle aches.  You have chest pain.  Your mucus gets thicker.  Your mucus changes from clear or white to yellow, green, Ruder, or bloody. SEEK IMMEDIATE MEDICAL CARE IF:  Your usual medicines do not stop your wheezing.   You have increased difficulty breathing.  You have any problems with the medicine you are taking, such as a rash, itching, swelling, or trouble breathing. MAKE SURE YOU:   Understand these instructions.  Will watch your condition.  Will get help right away if you are not doing well or get worse.   This information is  not intended to replace advice given to you by your health care provider. Make sure you discuss any questions you have with your health care provider.   Document Released: 01/03/2006 Document Revised: 04/08/2014 Document Reviewed: 04/26/2013 Elsevier Interactive Patient Education 2016 Elsevier Inc.  Chronic Obstructive Pulmonary Disease Chronic obstructive pulmonary disease (COPD) is a common lung condition in which airflow from the lungs is limited. COPD is a general term that can be used to describe many different lung problems that limit airflow, including both chronic bronchitis and emphysema. If you have COPD, your lung function will probably never return to normal, but there are measures you can take to improve lung function and make yourself feel better. CAUSES   Smoking (common).  Exposure to secondhand smoke.  Genetic problems.  Chronic inflammatory lung diseases or recurrent infections. SYMPTOMS  Shortness of breath, especially with physical activity.  Deep, persistent (chronic) cough with a large amount of thick mucus.  Wheezing.  Rapid breaths (tachypnea).  Woodroof or bluish discoloration (cyanosis) of the skin, especially in your fingers, toes, or lips.  Fatigue.  Weight loss.  Frequent infections or episodes when breathing symptoms become much worse (exacerbations).  Chest tightness. DIAGNOSIS Your health care provider will take a medical history and perform a physical examination to diagnose COPD. Additional tests for COPD may include:  Lung (pulmonary) function tests.  Chest X-ray.  CT scan.  Blood tests. TREATMENT  Treatment for COPD may include:  Inhaler and nebulizer medicines. These help manage the symptoms of COPD and make your breathing more comfortable.  Supplemental oxygen. Supplemental oxygen is only helpful if you have a low oxygen level in your blood.  Exercise and physical activity. These are beneficial for nearly all people with  COPD.  Lung surgery or transplant.  Nutrition therapy to gain weight, if you are underweight.  Pulmonary rehabilitation. This may involve working with a team of health care providers and specialists, such as respiratory, occupational, and physical therapists. HOME CARE INSTRUCTIONS  Take all medicines (inhaled or pills) as directed by your health care provider.  Avoid over-the-counter medicines or cough syrups that dry up your airway (such as antihistamines) and slow down the elimination of secretions unless instructed otherwise by your health care provider.  If you are a smoker, the most important thing that you can do is stop smoking. Continuing to smoke will cause further lung damage and breathing trouble. Ask your health care provider for help with quitting smoking. He or she can direct you to community resources or hospitals that provide support.  Avoid exposure to irritants such as smoke, chemicals, and fumes that aggravate your breathing.  Use oxygen therapy and pulmonary rehabilitation if directed by your health care provider. If you require home oxygen therapy, ask your health care provider whether you should purchase a pulse oximeter to measure your oxygen level at home.  Avoid contact with individuals who have a contagious illness.  Avoid extreme temperature and humidity changes.  Eat healthy foods. Eating smaller, more frequent meals and resting before meals may help you maintain your strength.  Stay active, but balance activity with periods of rest. Exercise and physical activity will help you maintain your ability  to do things you want to do.  Preventing infection and hospitalization is very important when you have COPD. Make sure to receive all the vaccines your health care provider recommends, especially the pneumococcal and influenza vaccines. Ask your health care provider whether you need a pneumonia vaccine.  Learn and use relaxation techniques to manage  stress.  Learn and use controlled breathing techniques as directed by your health care provider. Controlled breathing techniques include:  Pursed lip breathing. Start by breathing in (inhaling) through your nose for 1 second. Then, purse your lips as if you were going to whistle and breathe out (exhale) through the pursed lips for 2 seconds.  Diaphragmatic breathing. Start by putting one hand on your abdomen just above your waist. Inhale slowly through your nose. The hand on your abdomen should move out. Then purse your lips and exhale slowly. You should be able to feel the hand on your abdomen moving in as you exhale.  Learn and use controlled coughing to clear mucus from your lungs. Controlled coughing is a series of short, progressive coughs. The steps of controlled coughing are: 8. Lean your head slightly forward. 9. Breathe in deeply using diaphragmatic breathing. 10. Try to hold your breath for 3 seconds. 11. Keep your mouth slightly open while coughing twice. 12. Spit any mucus out into a tissue. 13. Rest and repeat the steps once or twice as needed. SEEK MEDICAL CARE IF:  You are coughing up more mucus than usual.  There is a change in the color or thickness of your mucus.  Your breathing is more labored than usual.  Your breathing is faster than usual. SEEK IMMEDIATE MEDICAL CARE IF:  You have shortness of breath while you are resting.  You have shortness of breath that prevents you from:  Being able to talk.  Performing your usual physical activities.  You have chest pain lasting longer than 5 minutes.  Your skin color is more cyanotic than usual.  You measure low oxygen saturations for longer than 5 minutes with a pulse oximeter. MAKE SURE YOU:  Understand these instructions.  Will watch your condition.  Will get help right away if you are not doing well or get worse.   This information is not intended to replace advice given to you by your health care  provider. Make sure you discuss any questions you have with your health care provider.   Document Released: 12/26/2004 Document Revised: 04/08/2014 Document Reviewed: 11/12/2012 Elsevier Interactive Patient Education [employer].

## 2015-06-05 NOTE — ED Notes (Signed)
Pt comes in with cough and congestion with fever since Saturday.

## 2015-06-05 NOTE — ED Provider Notes (Signed)
CSN: [id number]     Arrival date & time 06/05/15  1039 History  By signing my name below, I, Soijett Blue, attest that this documentation has been prepared under the direction and in the presence of Alyse Low, PA-C Electronically Signed: Soijett Blue, ED Scribe. 06/05/2015. 11:24 AM.   Chief Complaint  Patient presents with  . Cough      The history is provided by the patient. No language interpreter was used.    Derek Chen is a 55 y.o. male with a PMHx of HTN, basel cell CA, CAD, COPD, MI, who presents to the Emergency Department complaining of constant, non-productive, cough onset 4 days worsening yesterday. Pt has sick contacts of his wife. Pt did get the flu shot this year. Pt reports that he has been sleeping in a recliner to aid with his breathing and coughing. Pt states that he is on 2 liters of O2 PRN. Pt was Rx tessalon and tussinex for the relief of his symptoms. Pt denies being on prednisone recently. He states that he is having associated symptoms of nasal congestion, fever of 102 this morning resolved with tylenol, and CP due to the cough. Pt notes that his CP is not similar to when he had his three MI. He states that he has tried tylenol with no relief for his symptoms. He denies SOB and any other symptoms. Pt PCP is Gean Quint, PA-C in [place], [place]. Pt notes that he stopped smoking following his first MI.   Past Medical History  Diagnosis Date  . Essential hypertension, benign   . Dyslipidemia   . Basal cell cancer     Chest and back - status post skin grafting  . Hepatitis B antibody positive 2006  . Viral pneumonia, unspecified 2004  . Type 2 diabetes mellitus ([place]) 2012  . Coronary artery disease     NSTEMI - DES mid LAD December 2011 (patent December 2013)  . Anxiety   . Cardiomyopathy (HCC)     LVEF 45-50%  . COPD (chronic obstructive pulmonary disease) ([place])   . MI (myocardial infarction) [facility])    Past Surgical History  Procedure Laterality Date  .  Finger surgery    . Skin graft split thickness trunk    01/23/2010     Right chest;  Felicie Morn, M.D.  . Appendectomy    . Knee arthroscopy      Left  . Vasectomy    . Coronary stent placement    . Left heart catheterization with coronary angiogram N/A 03/20/2012    Procedure: LEFT HEART CATHETERIZATION WITH CORONARY ANGIOGRAM;  Surgeon: Thayer Headings, MD;  Location: [facility] CATH LAB;  Service: Cardiovascular;  Laterality: N/A;   Family History  Problem Relation Age of Onset  . Diabetes Mother   . Deep vein thrombosis Mother   . Pulmonary embolism Mother   . Cancer Father     hodgins lymphoma  . Diabetes Sister    Social History  Substance Use Topics  . Smoking status: Former Smoker -- 1.00 packs/day for 28 years    Types: Cigarettes    Quit date: 05/08/2010  . Smokeless tobacco: Former Systems developer  . Alcohol Use: 1.0 - 1.5 oz/week    2-3 Standard drinks or equivalent per week     Comment: Drinks occasional beer and wine    Review of Systems  Constitutional: Positive for fever.  Respiratory: Positive for cough. Negative for shortness of breath.   Cardiovascular: Positive for chest pain (due to  cough).      Allergies  Advil and Cimetidine  Home Medications   Prior to Admission medications   Medication Sig Start Date End Date Taking? Authorizing Provider  albuterol (PROVENTIL) (2.5 MG/3ML) 0.083% nebulizer solution Take 3 mLs (2.5 mg total) by nebulization every 6 (six) hours as needed for wheezing or shortness of breath. 11/10/13   Mary-Margaret Hassell Done, FNP  aspirin EC 81 MG tablet Take 81 mg by mouth daily.    Historical Provider, MD  beclomethasone (QVAR) 80 MCG/ACT inhaler Inhale 1 puff into the lungs 2 (two) times daily. 03/07/14   Chipper Herb, MD  benzonatate (TESSALON) 100 MG capsule TAKE ONE CAPSULE BY MOUTH THREE TIMES DAILY AS NEEDED FOR COUGH. 12/27/13   Mary-Margaret Hassell Done, FNP  butalbital-acetaminophen-caffeine (ESGIC) 50-325-40 MG per tablet Take 1 tablet by  mouth 2 (two) times daily as needed for headache. 04/05/14   Mary-Margaret Hassell Done, FNP  clonazePAM (KLONOPIN) 2 MG tablet Take 1 tablet (2 mg total) by mouth 3 (three) times daily as needed for anxiety. 04/05/14   Mary-Margaret Hassell Done, FNP  clopidogrel (PLAVIX) 75 MG tablet TAKE 1 TABLET BY MOUTH ONCE A DAY (GENERIC FOR PLAVIX) 03/28/15   Mary-Margaret Hassell Done, FNP  DULoxetine (CYMBALTA) 60 MG capsule Take 1 capsule (60 mg total) by mouth daily. 03/17/14   Mary-Margaret Hassell Done, FNP  fluticasone (FLONASE) 50 MCG/ACT nasal spray Place 2 sprays into both nostrils daily as needed. 08/09/13   Mary-Margaret Hassell Done, FNP  glimepiride (AMARYL) 2 MG tablet Take 1 tablet (2 mg total) by mouth 2 (two) times daily. 03/08/14   Chipper Herb, MD  glimepiride (AMARYL) 2 MG tablet TAKE ONE TABLET BY MOUTH TWICE DAILY. 12/20/14   Claretta Fraise, MD  isosorbide mononitrate (IMDUR) 60 MG 24 hr tablet Take 1 tablet (60 mg total) by mouth daily. 06/29/14   Mary-Margaret Hassell Done, FNP  lisinopril (PRINIVIL,ZESTRIL) 20 MG tablet Take 1 tablet (20 mg total) by mouth daily. 11/10/13   Mary-Margaret Hassell Done, FNP  meclizine (ANTIVERT) 25 MG tablet TAKE ONE TABLET BY MOUTH THREE TIMES DAILY AS NEEDED FOR DIZZINESS. 12/14/13   Mary-Margaret Hassell Done, FNP  metFORMIN (GLUCOPHAGE) 1000 MG tablet Take 1 tablet (1,000 mg total) by mouth 2 (two) times daily. 11/10/13   Mary-Margaret Hassell Done, FNP  methylPREDNIsolone (MEDROL DOSPACK) 4 MG tablet follow package directions 03/03/14   Lysbeth Penner, FNP  metoprolol tartrate (LOPRESSOR) 25 MG tablet Take 1 tablet (25 mg total) by mouth 2 (two) times daily. 01/27/14   Tammy Eckard, PHARMD  nitroGLYCERIN (NITROSTAT) 0.4 MG SL tablet Place 1 tablet (0.4 mg total) under the tongue every 5 (five) minutes as needed. 01/27/14   Tammy Eckard, PHARMD  ondansetron (ZOFRAN) 8 MG tablet Take 1 tablet (8 mg total) by mouth every 8 (eight) hours as needed. for nausea Patient not taking: Reported on 03/03/2014 02/18/14    Mary-Margaret Hassell Done, FNP  oxyCODONE-acetaminophen (ROXICET) 5-325 MG per tablet Take 1 tablet by mouth every 8 (eight) hours as needed for severe pain. 03/03/14   Lysbeth Penner, FNP  pantoprazole (PROTONIX) 40 MG tablet Take 1 tablet (40 mg total) by mouth daily. 11/10/13   Mary-Margaret Hassell Done, FNP  rosuvastatin (CRESTOR) 10 MG tablet Take 1 tablet (10 mg total) by mouth daily. 11/10/13   Mary-Margaret Hassell Done, FNP  sitaGLIPtin (JANUVIA) 100 MG tablet Take 1 tablet (100 mg total) by mouth daily. 10/26/13   Mary-Margaret Hassell Done, FNP  tamsulosin (FLOMAX) 0.4 MG CAPS capsule Take 1 capsule (0.4 mg total) by mouth daily.  03/17/14   Mary-Margaret Hassell Done, FNP  tamsulosin (FLOMAX) 0.4 MG CAPS capsule TAKE ONE CAPSULE BY MOUTH ONCE DAILY 05/16/14   Mary-Margaret Hassell Done, FNP  zolpidem (AMBIEN) 10 MG tablet Take 1 tablet (10 mg total) by mouth at bedtime as needed for sleep. 04/05/14 05/05/14  Mary-Margaret Hassell Done, FNP   BP 120/74 mmHg  Pulse 81  Temp(Src) 97.7 F (36.5 C) (Oral)  Resp 18  Ht 5\' 11"  (1.803 m)  Wt 156 lb (70.761 kg)  BMI 21.77 kg/m2  SpO2 100% Physical Exam  Constitutional: He is oriented to person, place, and time. He appears well-developed and well-nourished. No distress.  HENT:  Head: Normocephalic and atraumatic.  Mouth/Throat: Uvula is midline, oropharynx is clear and moist and mucous membranes are normal.  Eyes: EOM are normal.  Neck: Neck supple.  Cardiovascular: Normal rate, regular rhythm and normal heart sounds.  Exam reveals no gallop and no friction rub.   No murmur heard. Pulmonary/Chest: Effort normal. No respiratory distress. He has decreased breath sounds. He has no wheezes. He has no rales.  Harsh breathe sounds.   Abdominal: Soft. There is no tenderness.  Musculoskeletal: Normal range of motion.  Neurological: He is alert and oriented to person, place, and time.  Skin: Skin is warm and dry.  Psychiatric: He has a normal mood and affect. His behavior is normal.   Nursing note and vitals reviewed.   ED Course  Procedures (including critical care time) DIAGNOSTIC STUDIES: Oxygen Saturation is 100% on RA, nl by my interpretation.    COORDINATION OF CARE: 11:23 AM Discussed treatment plan with pt at bedside which includes CXR and pt agreed to plan.    Labs Review Labs Reviewed - No data to display  Imaging Review Dg Chest 2 View  06/05/2015  CLINICAL DATA:  Nonproductive cough and fevers EXAM: CHEST  2 VIEW COMPARISON:  01/25/2013 FINDINGS: The heart size and mediastinal contours are within normal limits. Both lungs are clear. The visualized skeletal structures are unremarkable. IMPRESSION: No active cardiopulmonary disease. Electronically Signed   By: Inez Catalina M.D.   On: 06/05/2015 12:39   I have personally reviewed and evaluated these images as part of my medical decision-making.   EKG Interpretation None      MDM   Final diagnoses:  Bronchitis  Simple chronic bronchitis ([place])   Meds ordered this encounter  Medications  . CINNAMON PO    Sig: Take 1,000 mg by mouth daily.  Marland Kitchen azithromycin (ZITHROMAX) 250 MG tablet    Sig: Take 1 tablet (250 mg total) by mouth daily. Take first 2 tablets together, then 1 every day until finished.    Dispense:  6 tablet    Refill:  0    Order Specific Question:  Supervising Provider    Answer:  MILLER, BRIAN [[id number]]  . predniSONE (DELTASONE) 10 MG tablet    Sig: 6,5,4,3,2,1 taper    Dispense:  21 tablet    Refill:  0    Order Specific Question:  Supervising Provider    Answer:  MILLER, BRIAN [[id number]]  . HYDROcodone-acetaminophen (NORCO/VICODIN) 5-325 MG tablet    Sig: Take 2 tablets by mouth every 4 (four) hours as needed.    Dispense:  20 tablet    Refill:  0    Order Specific Question:  Supervising Provider    Answer:  Noemi Chapel [[id number]]   An After Visit Summary was printed and given to the patient.  Fransico Meadow, PA-C 06/05/15 [place],  MD 06/05/15 1305

## 2015-06-09 ENCOUNTER — Other Ambulatory Visit: Payer: Self-pay | Admitting: Family Medicine

## 2015-06-30 ENCOUNTER — Other Ambulatory Visit (INDEPENDENT_AMBULATORY_CARE_PROVIDER_SITE_OTHER): Payer: Self-pay | Admitting: *Deleted

## 2015-06-30 ENCOUNTER — Encounter (INDEPENDENT_AMBULATORY_CARE_PROVIDER_SITE_OTHER): Payer: Self-pay | Admitting: *Deleted

## 2015-06-30 NOTE — Telephone Encounter (Signed)
trilyte

## 2015-07-03 MED ORDER — PEG 3350-KCL-NA BICARB-NACL 420 G PO SOLR
4000.0000 mL | Freq: Once | ORAL | Status: DC
Start: 2015-07-03 — End: 2015-08-10

## 2015-07-12 ENCOUNTER — Telehealth (INDEPENDENT_AMBULATORY_CARE_PROVIDER_SITE_OTHER): Payer: Self-pay | Admitting: *Deleted

## 2015-07-12 NOTE — Telephone Encounter (Signed)
Referring MD/PCP: [facility]   Procedure: tcs  Reason/Indication:  screening  Has patient had this procedure before?  no  If so, when, by whom and where?    Is there a family history of colon cancer?  no  Who?  What age when diagnosed?    Is patient diabetic?   yes      Does patient have prosthetic heart valve or mechanical valve?  no  Do you have a pacemaker?  no  Has patient ever had endocarditis? Not sure  Has patient had joint replacement within last 12 months?  no  Does patient tend to be constipated or take laxatives? no  Does patient have a history of alcohol/drug use?  no  Is patient on Coumadin, Plavix and/or Aspirin? yes  Medications: plavix 75 mg daily, januvia 100 mg daily, isosorbide 60 mg 1 1/2 tab daily, metformin 2000 mg bid, lisinopril 20 mg daily, protonix 40 mg daily, metoprolol 25 mg bid, benzonatate 100 mg tid, ambien 10 mg bid, ventolin inhaler, oxygen at night and prn, glimepiride 2 mg bid, flomax 0.4 mg daily, cymbalta 60 mg daily, crestor 10 mg daily, cinnamon 1000 mg bid, asa 81 mg daily       NO PROPOFOL PER DR REHMAN  Allergies: tagament, advil, aleve  Medication Adjustment: asa 2 days, plavix 5 days, hold januvia day before, don't take metformin & glimepiride evening before   Procedure date & time: 08/10/15 at 730

## 2015-07-12 NOTE — Telephone Encounter (Signed)
agree

## 2015-08-07 ENCOUNTER — Encounter (INDEPENDENT_AMBULATORY_CARE_PROVIDER_SITE_OTHER): Payer: Self-pay | Admitting: *Deleted

## 2015-08-10 ENCOUNTER — Ambulatory Visit (HOSPITAL_COMMUNITY)
Admission: RE | Admit: 2015-08-10 | Discharge: 2015-08-10 | Disposition: A | Discharge status: Home / Self Care | Payer: Medicare Other | Source: Ambulatory Visit | Attending: Internal Medicine | Admitting: Internal Medicine

## 2015-08-10 ENCOUNTER — Encounter (HOSPITAL_COMMUNITY)
Admission: RE | Disposition: A | Discharge status: Home / Self Care | Payer: Self-pay | Source: Ambulatory Visit | Attending: Internal Medicine

## 2015-08-10 ENCOUNTER — Encounter (HOSPITAL_COMMUNITY): Payer: Self-pay

## 2015-08-10 DIAGNOSIS — Z1211 Encounter for screening for malignant neoplasm of colon: Secondary | ICD-10-CM | POA: Diagnosis not present

## 2015-08-10 DIAGNOSIS — Z807 Family history of other malignant neoplasms of lymphoid, hematopoietic and related tissues: Secondary | ICD-10-CM | POA: Diagnosis not present

## 2015-08-10 DIAGNOSIS — D123 Benign neoplasm of transverse colon: Secondary | ICD-10-CM | POA: Insufficient documentation

## 2015-08-10 DIAGNOSIS — D125 Benign neoplasm of sigmoid colon: Secondary | ICD-10-CM | POA: Insufficient documentation

## 2015-08-10 DIAGNOSIS — I10 Essential (primary) hypertension: Secondary | ICD-10-CM | POA: Insufficient documentation

## 2015-08-10 DIAGNOSIS — K648 Other hemorrhoids: Secondary | ICD-10-CM | POA: Insufficient documentation

## 2015-08-10 DIAGNOSIS — I2510 Atherosclerotic heart disease of native coronary artery without angina pectoris: Secondary | ICD-10-CM | POA: Diagnosis not present

## 2015-08-10 DIAGNOSIS — E119 Type 2 diabetes mellitus without complications: Secondary | ICD-10-CM | POA: Diagnosis not present

## 2015-08-10 DIAGNOSIS — K621 Rectal polyp: Secondary | ICD-10-CM

## 2015-08-10 DIAGNOSIS — Z7982 Long term (current) use of aspirin: Secondary | ICD-10-CM | POA: Insufficient documentation

## 2015-08-10 DIAGNOSIS — F419 Anxiety disorder, unspecified: Secondary | ICD-10-CM | POA: Diagnosis not present

## 2015-08-10 DIAGNOSIS — I252 Old myocardial infarction: Secondary | ICD-10-CM | POA: Diagnosis not present

## 2015-08-10 DIAGNOSIS — E785 Hyperlipidemia, unspecified: Secondary | ICD-10-CM | POA: Diagnosis not present

## 2015-08-10 DIAGNOSIS — Z87891 Personal history of nicotine dependence: Secondary | ICD-10-CM | POA: Diagnosis not present

## 2015-08-10 DIAGNOSIS — Z85828 Personal history of other malignant neoplasm of skin: Secondary | ICD-10-CM | POA: Diagnosis not present

## 2015-08-10 DIAGNOSIS — Z7984 Long term (current) use of oral hypoglycemic drugs: Secondary | ICD-10-CM | POA: Insufficient documentation

## 2015-08-10 DIAGNOSIS — K644 Residual hemorrhoidal skin tags: Secondary | ICD-10-CM | POA: Diagnosis not present

## 2015-08-10 DIAGNOSIS — J449 Chronic obstructive pulmonary disease, unspecified: Secondary | ICD-10-CM | POA: Insufficient documentation

## 2015-08-10 HISTORY — PX: COLONOSCOPY: SHX5424

## 2015-08-10 LAB — GLUCOSE, CAPILLARY: Glucose-Capillary: 123 mg/dL — ABNORMAL HIGH (ref 65–99)

## 2015-08-10 SURGERY — COLONOSCOPY
Anesthesia: Moderate Sedation

## 2015-08-10 MED ORDER — MIDAZOLAM HCL 5 MG/5ML IJ SOLN
INTRAMUSCULAR | Status: DC | PRN
  Administered 2015-08-10: 2 mg via INTRAVENOUS
  Administered 2015-08-10: 1 mg via INTRAVENOUS
  Administered 2015-08-10: 2 mg via INTRAVENOUS
  Administered 2015-08-10: 3 mg via INTRAVENOUS
  Administered 2015-08-10: 2 mg via INTRAVENOUS

## 2015-08-10 MED ORDER — MIDAZOLAM HCL 5 MG/5ML IJ SOLN
INTRAMUSCULAR | Status: AC
  Filled 2015-08-10: qty 10

## 2015-08-10 MED ORDER — MEPERIDINE HCL 50 MG/ML IJ SOLN
INTRAMUSCULAR | Status: AC
  Filled 2015-08-10: qty 1

## 2015-08-10 MED ORDER — MEPERIDINE HCL 50 MG/ML IJ SOLN
INTRAMUSCULAR | Status: DC | PRN
  Administered 2015-08-10 (×2): 25 mg via INTRAVENOUS

## 2015-08-10 MED ORDER — PROMETHAZINE HCL 12.5 MG PO TABS: 12.5000 mg | ORAL_TABLET | Freq: Two times a day (BID) | ORAL | Status: DC | PRN

## 2015-08-10 MED ORDER — SODIUM CHLORIDE 0.9 % IV SOLN
INTRAVENOUS | Status: DC
Start: 2015-08-10 — End: 2015-08-10
  Administered 2015-08-10: 07:00:00 via INTRAVENOUS

## 2015-08-10 MED ORDER — SIMETHICONE 40 MG/0.6ML PO SUSP
ORAL | Status: DC | PRN
  Administered 2015-08-10: 2.5 mL

## 2015-08-10 NOTE — Op Note (Signed)
[facility] Patient Name: Derek Chen Procedure Date: 08/10/2015 7:32 AM MRN: [id number] Date of Birth: 1961-01-04 Attending MD: Hildred Laser , MD CSN: [id number] Age: 55 Admit Type: Outpatient Procedure:                Colonoscopy Indications:              Screening for colorectal malignant neoplasm Providers:                Hildred Laser, MD, Lurline Del, RN, Georgeann Oppenheim,                            Technician Referring MD:             Barbaraann Faster. Hall, [facility] Medicines:                Meperidine 50 mg IV, Midazolam 10 mg IV Complications:            No immediate complications. Estimated Blood Loss:     Estimated blood loss was minimal. Procedure:                Pre-Anesthesia Assessment:                           - Prior to the procedure, a History and Physical                            was performed, and patient medications and                            allergies were reviewed. The patient's tolerance of                            previous anesthesia was also reviewed. The risks                            and benefits of the procedure and the sedation                            options and risks were discussed with the patient.                            All questions were answered, and informed consent                            was obtained. Prior Anticoagulants: The patient                            last took aspirin 2 days and Plavix (clopidogrel) 5                            days prior to the procedure. ASA Grade Assessment:                            III - A patient with severe systemic disease. After  reviewing the risks and benefits, the patient was                            deemed in satisfactory condition to undergo the                            procedure.                           After obtaining informed consent, the colonoscope                            was passed under direct vision. Throughout the   procedure, the patient's blood pressure, pulse, and                            oxygen saturations were monitored continuously. The                            [id number] scope was introduced through                            the anus and advanced to the the cecum, identified                            by appendiceal orifice and ileocecal valve. The                            colonoscopy was performed without difficulty. The                            patient tolerated the procedure well. The quality                            of the bowel preparation was adequate. The                            ileocecal valve, appendiceal orifice, and rectum                            were photographed. Scope In: [id number] Scope Out: 8:25:04 AM Scope Withdrawal Time: 0 hours 29 minutes 35 seconds  Total Procedure Duration: 0 hours 38 minutes 22 seconds  Findings:      A 9 mm polyp was found in the transverse colon. The polyp was sessile.       The polyp was removed with a hot snare. Resection and retrieval were       complete. To stop active bleeding, three hemostatic clips were       successfully placed (MR conditional). There was no bleeding at the end       of the procedure.      Two semi-pedunculated polyps were found in the sigmoid colon. The polyps       were 7 to 10 mm in size. These polyps were removed with a hot snare.       Resection and retrieval were complete.  A 8 mm polyp was found in the rectum. The polyp was sessile. The polyp       was removed with a hot snare. Resection and retrieval were complete.      External internal hemorrhoids were found during retroflexion. The       hemorrhoids were small. Impression:               - One 9 mm polyp in the transverse colon, removed                            with a hot snare. Resected and retrieved. Clips (MR                            conditional) were placed.                           - Two 7 to 10 mm polyps in the sigmoid  colon,                            removed with a hot snare. Resected and retrieved.                           - One 8 mm polyp in the rectum, removed with a hot                            snare. Resected and retrieved.                           - External internal hemorrhoids. Moderate Sedation:      Moderate (conscious) sedation was administered by the endoscopy nurse       and supervised by the endoscopist. The following parameters were       monitored: oxygen saturation, heart rate, blood pressure, CO2       capnography and response to care. Total physician intraservice time was       43 minutes. Recommendation:           - Patient has a contact number available for                            emergencies. The signs and symptoms of potential                            delayed complications were discussed with the                            patient. Return to normal activities tomorrow.                            Written discharge instructions were provided to the                            patient.                           - Resume previous diet today.                           -  Continue present medications.                           - Resume aspirin in 5 days and Plavix (clopidogrel)                            in 5 days at prior doses.                           - Repeat colonoscopy for surveillance based on                            pathology results. Procedure Code(s):        --- Professional ---                           [phone], Colonoscopy, flexible; with removal of                            tumor(s), polyp(s), or other lesion(s) by snare                            technique                           99152, Moderate sedation services provided by the                            same physician or other qualified health care                            professional performing the diagnostic or                            therapeutic service that the sedation supports,                             requiring the presence of an independent trained                            observer to assist in the monitoring of the                            patient's level of consciousness and physiological                            status; initial 15 minutes of intraservice time,                            patient age 72 years or older                           [phone], Moderate sedation services; each additional  15 minutes intraservice time                           99153, Moderate sedation services; each additional                            15 minutes intraservice time Diagnosis Code(s):        --- Professional ---                           Z12.11, Encounter for screening for malignant                            neoplasm of colon                           D12.3, Benign neoplasm of transverse colon (hepatic                            flexure or splenic flexure)                           K62.1, Rectal polyp                           D12.5, Benign neoplasm of sigmoid colon                           K64.4, Residual hemorrhoidal skin tags                           K64.8, Other hemorrhoids CPT copyright 2016 American Medical Association. All rights reserved. The codes documented in this report are preliminary and upon coder review may  be revised to meet current compliance requirements. Hildred Laser, MD Hildred Laser, MD 08/10/2015 8:44:27 AM This report has been signed electronically. Number of Addenda: 0

## 2015-08-10 NOTE — H&P (Signed)
Derek Chen is an 55 y.o. male.   Chief Complaint: Patient is here for colonoscopy. HPI: Patient is 54 year old Caucasian male who is here for screening colonoscopy. He denies abdominal pain change in bowel habits or rectal bleeding. Family History is negative for CRC.  Past Medical History  Diagnosis Date  . Essential hypertension, benign   . Dyslipidemia   . Basal cell cancer     Chest and back - status post skin grafting  . Hepatitis B antibody positive 2006  . Viral pneumonia, unspecified 2004  . Type 2 diabetes mellitus ([place]) 2012  . Coronary artery disease     NSTEMI - DES mid LAD December 2011 (patent December 2013)  . Anxiety   . Cardiomyopathy (HCC)     LVEF 45-50%  . COPD (chronic obstructive pulmonary disease) ([place])   . MI (myocardial infarction) [facility])     Past Surgical History  Procedure Laterality Date  . Finger surgery    . Skin graft split thickness trunk    01/23/2010     Right chest;  Felicie Morn, M.D.  . Appendectomy    . Knee arthroscopy      Left  . Vasectomy    . Coronary stent placement    . Left heart catheterization with coronary angiogram N/A 03/20/2012    Procedure: LEFT HEART CATHETERIZATION WITH CORONARY ANGIOGRAM;  Surgeon: Thayer Headings, MD;  Location: [facility] CATH LAB;  Service: Cardiovascular;  Laterality: N/A;    Family History  Problem Relation Age of Onset  . Diabetes Mother   . Deep vein thrombosis Mother   . Pulmonary embolism Mother   . Cancer Father     hodgins lymphoma  . Diabetes Sister    Social History:  reports that he quit smoking about 5 years ago. His smoking use included Cigarettes. He has a 28 pack-year smoking history. He has quit using smokeless tobacco. He reports that he drinks about 1.0 - 1.5 oz of alcohol per week. He reports that he does not use illicit drugs.  Allergies:  Allergies  Allergen Reactions  . Advil [Ibuprofen] Itching  . Mobic [Meloxicam]   . Naproxen Nausea Only  . Tramadol   . Cimetidine  Other (See Comments) and Rash    headaches Severe headache    Medications Prior to Admission  Medication Sig Dispense Refill  . albuterol (PROVENTIL HFA;VENTOLIN HFA) 108 (90 Base) MCG/ACT inhaler Inhale 1-2 puffs into the lungs every 6 (six) hours as needed for wheezing or shortness of breath.    Marland Kitchen albuterol (PROVENTIL) (2.5 MG/3ML) 0.083% nebulizer solution Take 3 mLs (2.5 mg total) by nebulization every 6 (six) hours as needed for wheezing or shortness of breath. 75 mL 3  . aspirin EC 81 MG tablet Take 81 mg by mouth daily.    . beclomethasone (QVAR) 80 MCG/ACT inhaler Inhale 1 puff into the lungs 2 (two) times daily. 3 Inhaler 3  . butalbital-acetaminophen-caffeine (ESGIC) 50-325-40 MG per tablet Take 1 tablet by mouth 2 (two) times daily as needed for headache. (Patient taking differently: Take 1 tablet by mouth 3 (three) times daily. ) 30 tablet 0  . CINNAMON PO Take 1,000 mg by mouth daily.    . clonazePAM (KLONOPIN) 2 MG tablet Take 1 tablet (2 mg total) by mouth 3 (three) times daily as needed for anxiety. 90 tablet 1  . clopidogrel (PLAVIX) 75 MG tablet TAKE 1 TABLET BY MOUTH ONCE A DAY (GENERIC FOR PLAVIX) 90 tablet 0  . DULoxetine (  CYMBALTA) 60 MG capsule Take 1 capsule (60 mg total) by mouth daily. 90 capsule 1  . fluticasone (FLONASE) 50 MCG/ACT nasal spray Place 2 sprays into both nostrils daily as needed.    Marland Kitchen glimepiride (AMARYL) 2 MG tablet Take 1 tablet (2 mg total) by mouth 2 (two) times daily. 180 tablet 3  . isosorbide mononitrate (IMDUR) 60 MG 24 hr tablet Take 1 tablet (60 mg total) by mouth daily. (Patient taking differently: Take 90 mg by mouth daily. ) 90 tablet 0  . lisinopril (PRINIVIL,ZESTRIL) 20 MG tablet Take 1 tablet (20 mg total) by mouth daily. 90 tablet 1  . losartan (COZAAR) 50 MG tablet Take 1 tablet by mouth daily.    . meclizine (ANTIVERT) 25 MG tablet TAKE ONE TABLET BY MOUTH THREE TIMES DAILY AS NEEDED FOR DIZZINESS. 30 tablet 5  . metFORMIN  (GLUCOPHAGE) 1000 MG tablet Take 1 tablet (1,000 mg total) by mouth 2 (two) times daily. 180 tablet 1  . metoprolol tartrate (LOPRESSOR) 25 MG tablet Take 1 tablet (25 mg total) by mouth 2 (two) times daily. 180 tablet 3  . nitroGLYCERIN (NITROSTAT) 0.4 MG SL tablet Place 1 tablet (0.4 mg total) under the tongue every 5 (five) minutes as needed. 100 tablet 0  . ondansetron (ZOFRAN) 8 MG tablet Take 1 tablet (8 mg total) by mouth every 8 (eight) hours as needed. for nausea (Patient taking differently: Take 8 mg by mouth every 8 (eight) hours as needed for nausea or vomiting. for nausea) 10 tablet 0  . pantoprazole (PROTONIX) 40 MG tablet Take 1 tablet (40 mg total) by mouth daily. 90 tablet 1  . polyethylene glycol-electrolytes (NULYTELY/GOLYTELY) 420 g solution Take 4,000 mLs by mouth once. 4000 mL 0  . rosuvastatin (CRESTOR) 10 MG tablet Take 1 tablet (10 mg total) by mouth daily. 90 tablet 1  . Sildenafil Citrate (VIAGRA PO) Take 1 tablet by mouth as needed.     . sitaGLIPtin (JANUVIA) 100 MG tablet Take 1 tablet (100 mg total) by mouth daily. 28 tablet 0  . tamsulosin (FLOMAX) 0.4 MG CAPS capsule Take 1 capsule (0.4 mg total) by mouth daily. 90 capsule 3  . traZODone (DESYREL) 50 MG tablet Take 50 mg by mouth at bedtime.    Marland Kitchen zolpidem (AMBIEN) 10 MG tablet Take 1 tablet (10 mg total) by mouth at bedtime as needed for sleep. 30 tablet 1    No results found for this or any previous visit (from the past 48 hour(s)). No results found.  ROS  Blood pressure 128/85, pulse 65, temperature 98 F (36.7 C), temperature source Oral, resp. rate 13, height 5\' 11"  (1.803 m), weight 153 lb (69.4 kg), SpO2 98 %. Physical Exam  Constitutional: He appears well-developed and well-nourished.  HENT:  Mouth/Throat: Oropharynx is clear and moist.  Eyes: Conjunctivae are normal. No scleral icterus.  Neck: No thyromegaly present.  Cardiovascular: Normal rate, regular rhythm and normal heart sounds.   No murmur  heard. Respiratory: Effort normal and breath sounds normal.  GI: Soft. He exhibits no distension. There is no tenderness.  Musculoskeletal: He exhibits no edema.  Lymphadenopathy:    He has no cervical adenopathy.  Neurological: He is alert.  Skin: Skin is warm and dry.     Assessment/Plan Average risk screening colonoscopy.  Rogene Houston, MD 08/10/2015, 7:36 AM

## 2015-08-10 NOTE — Discharge Instructions (Signed)
Resume aspirin and clopidogrel on 08/16/2015 Resume other medications as before. Resume usual diet. No driving for 24 hours. Physician will call with biopsy results.  Colonoscopy, Care After Refer to this sheet in the next few weeks. These instructions provide you with information on caring for yourself after your procedure. Your health care provider may also give you more specific instructions. Your treatment has been planned according to current medical practices, but problems sometimes occur. Call your health care provider if you have any problems or questions after your procedure. WHAT TO EXPECT AFTER THE PROCEDURE  After your procedure, it is typical to have the following:  A small amount of blood in your stool.  Moderate amounts of gas and mild abdominal cramping or bloating. HOME CARE INSTRUCTIONS  Do not drive, operate machinery, or sign important documents for 24 hours.  You may shower and resume your regular physical activities, but move at a slower pace for the first 24 hours.  Take frequent rest periods for the first 24 hours.  Walk around or put a warm pack on your abdomen to help reduce abdominal cramping and bloating.  Drink enough fluids to keep your urine clear or pale yellow.  You may resume your normal diet as instructed by your health care provider. Avoid heavy or fried foods that are hard to digest.  Avoid drinking alcohol for 24 hours or as instructed by your health care provider.  Only take over-the-counter or prescription medicines as directed by your health care provider.  If a tissue sample (biopsy) was taken during your procedure:  Do not take aspirin or blood thinners for 7 days, or as instructed by your health care provider.  Do not drink alcohol for 7 days, or as instructed by your health care provider.  Eat soft foods for the first 24 hours. SEEK MEDICAL CARE IF: You have persistent spotting of blood in your stool 2-3 days after the  procedure. SEEK IMMEDIATE MEDICAL CARE IF:  You have more than a small spotting of blood in your stool.  You pass large blood clots in your stool.  Your abdomen is swollen (distended).  You have nausea or vomiting.  You have a fever.  You have increasing abdominal pain that is not relieved with medicine.   This information is not intended to replace advice given to you by your health care provider. Make sure you discuss any questions you have with your health care provider.   Document Released: 10/31/2003 Document Revised: 01/06/2013 Document Reviewed: 11/23/2012 Elsevier Interactive Patient Education 2016 Elsevier Inc. Colon Polyps Polyps are lumps of extra tissue growing inside the body. Polyps can grow in the large intestine (colon). Most colon polyps are noncancerous (benign). However, some colon polyps can become cancerous over time. Polyps that are larger than a pea may be harmful. To be safe, caregivers remove and test all polyps. CAUSES  Polyps form when mutations in the genes cause your cells to grow and divide even though no more tissue is needed. RISK FACTORS There are a number of risk factors that can increase your chances of getting colon polyps. They include:  Being older than 50 years.  Family history of colon polyps or colon cancer.  Long-term colon diseases, such as colitis or Crohn disease.  Being overweight.  Smoking.  Being inactive.  Drinking too much alcohol. SYMPTOMS  Most small polyps do not cause symptoms. If symptoms are present, they may include:  Blood in the stool. The stool may look dark red or  black.  Constipation or diarrhea that lasts longer than 1 week. DIAGNOSIS People often do not know they have polyps until their caregiver finds them during a regular checkup. Your caregiver can use 4 tests to check for polyps:  Digital rectal exam. The caregiver wears gloves and feels inside the rectum. This test would find polyps only in the  rectum.  Barium enema. The caregiver puts a liquid called barium into your rectum before taking X-rays of your colon. Barium makes your colon look white. Polyps are dark, so they are easy to see in the X-ray pictures.  Sigmoidoscopy. A thin, flexible tube (sigmoidoscope) is placed into your rectum. The sigmoidoscope has a light and tiny camera in it. The caregiver uses the sigmoidoscope to look at the last third of your colon.  Colonoscopy. This test is like sigmoidoscopy, but the caregiver looks at the entire colon. This is the most common method for finding and removing polyps. TREATMENT  Any polyps will be removed during a sigmoidoscopy or colonoscopy. The polyps are then tested for cancer. PREVENTION  To help lower your risk of getting more colon polyps:  Eat plenty of fruits and vegetables. Avoid eating fatty foods.  Do not smoke.  Avoid drinking alcohol.  Exercise every day.  Lose weight if recommended by your caregiver.  Eat plenty of calcium and folate. Foods that are rich in calcium include milk, cheese, and broccoli. Foods that are rich in folate include chickpeas, kidney beans, and spinach. HOME CARE INSTRUCTIONS Keep all follow-up appointments as directed by your caregiver. You may need periodic exams to check for polyps. SEEK MEDICAL CARE IF: You notice bleeding during a bowel movement.   This information is not intended to replace advice given to you by your health care provider. Make sure you discuss any questions you have with your health care provider.   Document Released: 12/13/2003 Document Revised: 04/08/2014 Document Reviewed: 05/28/2011 Elsevier Interactive Patient Education [employer].

## 2015-08-15 ENCOUNTER — Other Ambulatory Visit (INDEPENDENT_AMBULATORY_CARE_PROVIDER_SITE_OTHER): Payer: Self-pay | Admitting: Internal Medicine

## 2015-08-16 NOTE — Progress Notes (Signed)
Dr.Rehman denied this Rx. He states that the patient will need to get this from his PCP. Patient was made aware this morning.

## 2015-08-17 ENCOUNTER — Encounter (HOSPITAL_COMMUNITY): Payer: Self-pay | Admitting: Internal Medicine

## 2015-08-22 ENCOUNTER — Other Ambulatory Visit (INDEPENDENT_AMBULATORY_CARE_PROVIDER_SITE_OTHER): Payer: Self-pay | Admitting: Internal Medicine

## 2015-09-15 ENCOUNTER — Encounter (HOSPITAL_COMMUNITY): Payer: Self-pay

## 2015-09-15 ENCOUNTER — Emergency Department (HOSPITAL_COMMUNITY): Payer: Medicare Other

## 2015-09-15 ENCOUNTER — Inpatient Hospital Stay (HOSPITAL_COMMUNITY)
Admission: EM | Admit: 2015-09-15 | Discharge: 2015-09-16 | DRG: 311 | Disposition: A | Discharge status: Other Acute Inpatient Hospital | Payer: Medicare Other | Attending: Internal Medicine | Admitting: Internal Medicine

## 2015-09-15 DIAGNOSIS — F329 Major depressive disorder, single episode, unspecified: Secondary | ICD-10-CM | POA: Diagnosis present

## 2015-09-15 DIAGNOSIS — I200 Unstable angina: Principal | ICD-10-CM | POA: Diagnosis present

## 2015-09-15 DIAGNOSIS — Z7982 Long term (current) use of aspirin: Secondary | ICD-10-CM

## 2015-09-15 DIAGNOSIS — E785 Hyperlipidemia, unspecified: Secondary | ICD-10-CM | POA: Diagnosis present

## 2015-09-15 DIAGNOSIS — I4510 Unspecified right bundle-branch block: Secondary | ICD-10-CM | POA: Diagnosis present

## 2015-09-15 DIAGNOSIS — I452 Bifascicular block: Secondary | ICD-10-CM | POA: Diagnosis present

## 2015-09-15 DIAGNOSIS — Z7902 Long term (current) use of antithrombotics/antiplatelets: Secondary | ICD-10-CM

## 2015-09-15 DIAGNOSIS — Z955 Presence of coronary angioplasty implant and graft: Secondary | ICD-10-CM | POA: Diagnosis not present

## 2015-09-15 DIAGNOSIS — J449 Chronic obstructive pulmonary disease, unspecified: Secondary | ICD-10-CM | POA: Diagnosis present

## 2015-09-15 DIAGNOSIS — E876 Hypokalemia: Secondary | ICD-10-CM | POA: Diagnosis present

## 2015-09-15 DIAGNOSIS — F32A Depression, unspecified: Secondary | ICD-10-CM | POA: Diagnosis present

## 2015-09-15 DIAGNOSIS — I25110 Atherosclerotic heart disease of native coronary artery with unstable angina pectoris: Secondary | ICD-10-CM | POA: Diagnosis not present

## 2015-09-15 DIAGNOSIS — F419 Anxiety disorder, unspecified: Secondary | ICD-10-CM | POA: Diagnosis present

## 2015-09-15 DIAGNOSIS — E119 Type 2 diabetes mellitus without complications: Secondary | ICD-10-CM | POA: Diagnosis present

## 2015-09-15 DIAGNOSIS — Z85828 Personal history of other malignant neoplasm of skin: Secondary | ICD-10-CM

## 2015-09-15 DIAGNOSIS — G4700 Insomnia, unspecified: Secondary | ICD-10-CM | POA: Diagnosis present

## 2015-09-15 DIAGNOSIS — Z87891 Personal history of nicotine dependence: Secondary | ICD-10-CM | POA: Diagnosis not present

## 2015-09-15 DIAGNOSIS — I429 Cardiomyopathy, unspecified: Secondary | ICD-10-CM | POA: Diagnosis present

## 2015-09-15 DIAGNOSIS — Z807 Family history of other malignant neoplasms of lymphoid, hematopoietic and related tissues: Secondary | ICD-10-CM

## 2015-09-15 DIAGNOSIS — I2510 Atherosclerotic heart disease of native coronary artery without angina pectoris: Secondary | ICD-10-CM | POA: Diagnosis present

## 2015-09-15 DIAGNOSIS — R079 Chest pain, unspecified: Secondary | ICD-10-CM | POA: Diagnosis not present

## 2015-09-15 DIAGNOSIS — I252 Old myocardial infarction: Secondary | ICD-10-CM | POA: Diagnosis not present

## 2015-09-15 DIAGNOSIS — Z833 Family history of diabetes mellitus: Secondary | ICD-10-CM

## 2015-09-15 DIAGNOSIS — I209 Angina pectoris, unspecified: Secondary | ICD-10-CM | POA: Diagnosis not present

## 2015-09-15 DIAGNOSIS — I10 Essential (primary) hypertension: Secondary | ICD-10-CM | POA: Diagnosis present

## 2015-09-15 LAB — CBC WITH DIFFERENTIAL/PLATELET
BASOS PCT: 0 %
Basophils Absolute: 0 10*3/uL (ref 0.0–0.1)
EOS ABS: 0.1 10*3/uL (ref 0.0–0.7)
Eosinophils Relative: 1 %
HEMATOCRIT: 43.1 % (ref 39.0–52.0)
Hemoglobin: 14.7 g/dL (ref 13.0–17.0)
Lymphocytes Relative: 23 %
Lymphs Abs: 2 10*3/uL (ref 0.7–4.0)
MCH: 30.9 pg (ref 26.0–34.0)
MCHC: 34.1 g/dL (ref 30.0–36.0)
MCV: 90.7 fL (ref 78.0–100.0)
MONO ABS: 0.5 10*3/uL (ref 0.1–1.0)
MONOS PCT: 5 %
Neutro Abs: 6.4 10*3/uL (ref 1.7–7.7)
Neutrophils Relative %: 71 %
Platelets: 206 10*3/uL (ref 150–400)
RBC: 4.75 MIL/uL (ref 4.22–5.81)
RDW: 13.5 % (ref 11.5–15.5)
WBC: 9 10*3/uL (ref 4.0–10.5)

## 2015-09-15 LAB — HEPATIC FUNCTION PANEL
ALBUMIN: 3.9 g/dL (ref 3.5–5.0)
ALT: 20 U/L (ref 17–63)
AST: 14 U/L — AB (ref 15–41)
Alkaline Phosphatase: 55 U/L (ref 38–126)
BILIRUBIN DIRECT: 0.2 mg/dL (ref 0.1–0.5)
Indirect Bilirubin: 0.7 mg/dL (ref 0.3–0.9)
TOTAL PROTEIN: 6.7 g/dL (ref 6.5–8.1)
Total Bilirubin: 0.9 mg/dL (ref 0.3–1.2)

## 2015-09-15 LAB — BASIC METABOLIC PANEL
Anion gap: 9 (ref 5–15)
BUN: 14 mg/dL (ref 6–20)
CALCIUM: 8.8 mg/dL — AB (ref 8.9–10.3)
CO2: 23 mmol/L (ref 22–32)
CREATININE: 0.81 mg/dL (ref 0.61–1.24)
Chloride: 106 mmol/L (ref 101–111)
GFR calc non Af Amer: >60 mL/min (ref >60)
Glucose, Bld: 122 mg/dL — ABNORMAL HIGH (ref 65–99)
Potassium: 3.1 mmol/L — ABNORMAL LOW (ref 3.5–5.1)
SODIUM: 138 mmol/L (ref 135–145)

## 2015-09-15 LAB — PROTIME-INR
INR: 1.07 (ref 0.00–1.49)
Prothrombin Time: 14.1 seconds (ref 11.6–15.2)

## 2015-09-15 LAB — TSH: TSH: 2.294 u[IU]/mL (ref 0.350–4.500)

## 2015-09-15 LAB — APTT: APTT: 26 s (ref 24–37)

## 2015-09-15 LAB — TROPONIN I
Troponin I: <0.03 ng/mL (ref <0.031)
Troponin I: <0.03 ng/mL (ref <0.031)

## 2015-09-15 LAB — GLUCOSE, CAPILLARY: Glucose-Capillary: 78 mg/dL (ref 65–99)

## 2015-09-15 LAB — MAGNESIUM: MAGNESIUM: 2.1 mg/dL (ref 1.7–2.4)

## 2015-09-15 MED ORDER — PROMETHAZINE HCL 12.5 MG PO TABS
12.5000 mg | ORAL_TABLET | Freq: Two times a day (BID) | ORAL | Status: DC | PRN
  Administered 2015-09-16: 12.5 mg via ORAL
  Filled 2015-09-15: qty 1

## 2015-09-15 MED ORDER — INSULIN ASPART 100 UNIT/ML ~~LOC~~ SOLN
0.0000 [IU] | Freq: Three times a day (TID) | SUBCUTANEOUS | Status: DC
  Administered 2015-09-16: 1 [IU] via SUBCUTANEOUS
  Administered 2015-09-16: 2 [IU] via SUBCUTANEOUS

## 2015-09-15 MED ORDER — TAMSULOSIN HCL 0.4 MG PO CAPS: 0.4000 mg | ORAL_CAPSULE | Freq: Every day | ORAL | Status: DC

## 2015-09-15 MED ORDER — CLOPIDOGREL BISULFATE 75 MG PO TABS
75.0000 mg | ORAL_TABLET | Freq: Every day | ORAL | Status: DC
  Administered 2015-09-16: 75 mg via ORAL
  Filled 2015-09-15: qty 1

## 2015-09-15 MED ORDER — POTASSIUM CHLORIDE CRYS ER 20 MEQ PO TBCR
EXTENDED_RELEASE_TABLET | ORAL | Status: AC
  Filled 2015-09-15: qty 1

## 2015-09-15 MED ORDER — POTASSIUM CHLORIDE 10 MEQ/100ML IV SOLN
10.0000 meq | INTRAVENOUS | Status: AC
  Administered 2015-09-15 (×2): 10 meq via INTRAVENOUS
  Filled 2015-09-15: qty 100

## 2015-09-15 MED ORDER — HEPARIN (PORCINE) IN NACL 100-0.45 UNIT/ML-% IJ SOLN
1150.0000 [IU]/h | INTRAMUSCULAR | Status: DC
  Administered 2015-09-15: 850 [IU]/h via INTRAVENOUS
  Filled 2015-09-15 (×2): qty 250

## 2015-09-15 MED ORDER — TAMSULOSIN HCL 0.4 MG PO CAPS
ORAL_CAPSULE | ORAL | Status: AC
  Filled 2015-09-15: qty 1

## 2015-09-15 MED ORDER — ISOSORBIDE MONONITRATE ER 60 MG PO TB24: 90.0000 mg | ORAL_TABLET | Freq: Every day | ORAL | Status: DC

## 2015-09-15 MED ORDER — POTASSIUM CHLORIDE 10 MEQ/100ML IV SOLN
INTRAVENOUS | Status: AC
  Filled 2015-09-15: qty 100

## 2015-09-15 MED ORDER — ONDANSETRON HCL 4 MG/2ML IJ SOLN
4.0000 mg | Freq: Once | INTRAMUSCULAR | Status: AC
  Administered 2015-09-15: 4 mg via INTRAVENOUS
  Filled 2015-09-15: qty 2

## 2015-09-15 MED ORDER — ACETAMINOPHEN 325 MG PO TABS
650.0000 mg | ORAL_TABLET | ORAL | Status: DC | PRN
  Administered 2015-09-15: 650 mg via ORAL

## 2015-09-15 MED ORDER — BUDESONIDE 0.25 MG/2ML IN SUSP
0.2500 mg | Freq: Two times a day (BID) | RESPIRATORY_TRACT | Status: DC
  Administered 2015-09-15 – 2015-09-16 (×2): 0.25 mg via RESPIRATORY_TRACT
  Filled 2015-09-15 (×2): qty 2

## 2015-09-15 MED ORDER — POTASSIUM CHLORIDE CRYS ER 20 MEQ PO TBCR
20.0000 meq | EXTENDED_RELEASE_TABLET | Freq: Once | ORAL | Status: AC
  Administered 2015-09-15: 20 meq via ORAL

## 2015-09-15 MED ORDER — METOPROLOL TARTRATE 25 MG PO TABS
25.0000 mg | ORAL_TABLET | Freq: Two times a day (BID) | ORAL | Status: DC
  Administered 2015-09-15 – 2015-09-16 (×2): 25 mg via ORAL
  Filled 2015-09-15 (×2): qty 1

## 2015-09-15 MED ORDER — ONDANSETRON HCL 4 MG/2ML IJ SOLN: 4.0000 mg | Freq: Four times a day (QID) | INTRAMUSCULAR | Status: DC | PRN

## 2015-09-15 MED ORDER — PANTOPRAZOLE SODIUM 40 MG PO TBEC
40.0000 mg | DELAYED_RELEASE_TABLET | Freq: Every day | ORAL | Status: DC
  Administered 2015-09-16: 40 mg via ORAL
  Filled 2015-09-15: qty 1

## 2015-09-15 MED ORDER — MORPHINE SULFATE (PF) 4 MG/ML IV SOLN
4.0000 mg | Freq: Once | INTRAVENOUS | Status: AC
  Administered 2015-09-15: 4 mg via INTRAVENOUS
  Filled 2015-09-15: qty 1

## 2015-09-15 MED ORDER — NITROGLYCERIN IN D5W 200-5 MCG/ML-% IV SOLN
3.0000 ug/min | INTRAVENOUS | Status: DC
  Administered 2015-09-15: 30 ug/min via INTRAVENOUS

## 2015-09-15 MED ORDER — DULOXETINE HCL 60 MG PO CPEP
ORAL_CAPSULE | ORAL | Status: AC
  Filled 2015-09-15: qty 1

## 2015-09-15 MED ORDER — ZOLPIDEM TARTRATE 5 MG PO TABS
10.0000 mg | ORAL_TABLET | Freq: Every evening | ORAL | Status: DC | PRN
Start: 2015-09-15 — End: 2015-09-16

## 2015-09-15 MED ORDER — DULOXETINE HCL 30 MG PO CPEP
60.0000 mg | ORAL_CAPSULE | Freq: Every day | ORAL | Status: DC
  Administered 2015-09-15 – 2015-09-16 (×2): 60 mg via ORAL
  Filled 2015-09-15: qty 2

## 2015-09-15 MED ORDER — LOSARTAN POTASSIUM 50 MG PO TABS: 50.0000 mg | ORAL_TABLET | Freq: Every day | ORAL | Status: DC

## 2015-09-15 MED ORDER — MORPHINE SULFATE (PF) 4 MG/ML IV SOLN
4.0000 mg | Freq: Once | INTRAVENOUS | Status: AC
Start: 2015-09-15 — End: 2015-09-15
  Administered 2015-09-15: 4 mg via INTRAVENOUS
  Filled 2015-09-15: qty 1

## 2015-09-15 MED ORDER — FLUTICASONE PROPIONATE 50 MCG/ACT NA SUSP: 2.0000 | Freq: Every day | NASAL | Status: DC | PRN

## 2015-09-15 MED ORDER — TRAZODONE HCL 50 MG PO TABS
50.0000 mg | ORAL_TABLET | Freq: Every day | ORAL | Status: DC
  Administered 2015-09-15: 50 mg via ORAL
  Filled 2015-09-15: qty 1

## 2015-09-15 MED ORDER — ACETAMINOPHEN 325 MG PO TABS
ORAL_TABLET | ORAL | Status: AC
Start: 2015-09-15 — End: 2015-09-16
  Filled 2015-09-15: qty 2

## 2015-09-15 MED ORDER — ASPIRIN EC 81 MG PO TBEC
81.0000 mg | DELAYED_RELEASE_TABLET | Freq: Every day | ORAL | Status: DC
  Administered 2015-09-16: 81 mg via ORAL
  Filled 2015-09-15: qty 1

## 2015-09-15 MED ORDER — MORPHINE SULFATE (PF) 2 MG/ML IV SOLN
2.0000 mg | INTRAVENOUS | Status: DC | PRN
  Administered 2015-09-16 (×2): 2 mg via INTRAVENOUS
  Filled 2015-09-15 (×2): qty 1

## 2015-09-15 MED ORDER — HEPARIN BOLUS VIA INFUSION
4000.0000 [IU] | Freq: Once | INTRAVENOUS | Status: AC
  Administered 2015-09-15: 4000 [IU] via INTRAVENOUS

## 2015-09-15 MED ORDER — BECLOMETHASONE DIPROPIONATE 80 MCG/ACT IN AERS: 1.0000 | INHALATION_SPRAY | Freq: Two times a day (BID) | RESPIRATORY_TRACT | Status: DC

## 2015-09-15 MED ORDER — ROSUVASTATIN CALCIUM 10 MG PO TABS: 10.0000 mg | ORAL_TABLET | Freq: Every day | ORAL | Status: DC

## 2015-09-15 MED ORDER — ALBUTEROL SULFATE (2.5 MG/3ML) 0.083% IN NEBU: 2.5000 mg | INHALATION_SOLUTION | Freq: Four times a day (QID) | RESPIRATORY_TRACT | Status: DC | PRN

## 2015-09-15 MED ORDER — NITROGLYCERIN IN D5W 200-5 MCG/ML-% IV SOLN
5.0000 ug/min | Freq: Once | INTRAVENOUS | Status: AC
  Administered 2015-09-15: 5 ug/min via INTRAVENOUS
  Filled 2015-09-15: qty 250

## 2015-09-15 MED ORDER — TAMSULOSIN HCL 0.4 MG PO CAPS
0.4000 mg | ORAL_CAPSULE | Freq: Every day | ORAL | Status: DC
  Administered 2015-09-15: 0.4 mg via ORAL

## 2015-09-15 MED ORDER — CLONAZEPAM 0.5 MG PO TABS: 2.0000 mg | ORAL_TABLET | Freq: Three times a day (TID) | ORAL | Status: DC | PRN

## 2015-09-15 NOTE — ED Notes (Signed)
Pt request pain meds - speaking on phone, facial features relaxed- awaiting eval from cardio

## 2015-09-15 NOTE — ED Notes (Signed)
Pt reports that his wife will be coming in the morning as she does not drive after dark

## 2015-09-15 NOTE — ED Notes (Signed)
Pt request something for n

## 2015-09-15 NOTE — H&P (Signed)
History and Physical    Derek Chen [id number] DOB: 1960-09-03 DOA: 09/15/2015  PCP: Pcp Not In System   Patient coming from: Home   Chief Complaint: Chest pain   HPI: Derek Chen is a 55 y.o. male with medical history significant for hypertension, type 2 diabetes mellitus, coronary artery disease with stent to LAD, COPD, anxiety, depression, and insomnia who presents to the ED recurring episodes of substernal chest pain for the last 2-3 months with acute worsening. Patient has history of non-STEMI in 2011 treated with DES to LAD and has been subsequently managed with aspirin, Plavix, beta blocker, and ARB. He had been asymptomatic until 2-3 months ago when he began having episodic chest pain for which he been taking nitroglycerin approximately once a week. Over the last 2-3 days, episodes have become much more frequent and severe. The night prior to his presentation, he took 2 nitroglycerin with no appreciable relief in his symptoms, which ultimately abated spontaneously. He saw his PCP today for routine follow-up, noted that he had been having severe substernal chest pain, denied any active pain while at the PCP office, and was evaluated in the clinic with EKG. An apparently new right bundle-branch block was noted on the EKG and the patient was advised to come to the emergency department for further evaluation. By the time of his arrival to the ED, patient was complaining of "10/10" substernal chest pain with radiation to the left arm and shoulder. He also endorses concomitant dyspnea, though attributes this to his COPD. Pain is present at rest, reportedly worsens with exertion, and his not been responding to nitroglycerin at home. Patient denies any significant nausea, vomiting, or diarrhea. He denies diaphoresis. Patient denies recent long distance travel, prolonged immobilization, active cancer, personal or family history of DVT, or unilateral swelling or tenderness in the lower  extremity.  ED Course: Upon arrival to the ED, patient is found to be afebrile, saturating well on room air, and with vital signs stable. EKG demonstrates a sinus rhythm with incomplete RBBB, septal Q-wave, and QTc of 505 ms. Troponin is undetectable and INR is 1.07. BMP is notable for hypokalemia to 3.1 and CBC is unremarkable. Chest x-ray was obtained and negative for acute cardiopulmonary disease. Patient was treated with nitroglycerin with no appreciable relief and was started on nitroglycerin infusion. Given the patient's concerning symptoms and history of non-STEMI with stent to the LAD, he was started on heparin infusion empirically. Morphine 4 mg IV was administered for pain not relieved by nitroglycerin. Cardiology was consulted by the ED physician and admission to [facility] was advised for possible cardiac catheterization the following day. Unfortunately, there are no appropriate beds available at [facility], and so the patient will be admitted to the stepdown unit here at [facility] for ongoing evaluation and management of chest pain concerning for possible ACS.  Review of Systems:  All other systems reviewed and apart from HPI, are negative.  Past Medical History  Diagnosis Date  . Essential hypertension, benign   . Dyslipidemia   . Basal cell cancer     Chest and back - status post skin grafting  . Hepatitis B antibody positive 2006  . Viral pneumonia, unspecified 2004  . Type 2 diabetes mellitus ([place]) 2012  . Coronary artery disease     NSTEMI - DES mid LAD December 2011 (patent December 2013)  . Anxiety   . Cardiomyopathy (HCC)     LVEF 45-50%  . COPD (chronic  obstructive pulmonary disease) ([place])   . MI (myocardial infarction) [facility])     Past Surgical History  Procedure Laterality Date  . Finger surgery    . Skin graft split thickness trunk    01/23/2010     Right chest;  Felicie Morn, M.D.  . Appendectomy    . Knee arthroscopy      Left  .  Vasectomy    . Coronary stent placement    . Left heart catheterization with coronary angiogram N/A 03/20/2012    Procedure: LEFT HEART CATHETERIZATION WITH CORONARY ANGIOGRAM;  Surgeon: Thayer Headings, MD;  Location: [facility] CATH LAB;  Service: Cardiovascular;  Laterality: N/A;  . Colonoscopy N/A 08/10/2015    Procedure: COLONOSCOPY;  Surgeon: Rogene Houston, MD;  Location: [facility] ENDO SUITE;  Service: Endoscopy;  Laterality: N/A;  730     reports that he quit smoking about 5 years ago. His smoking use included Cigarettes. He has a 28 pack-year smoking history. He has quit using smokeless tobacco. He reports that he drinks about 1.0 - 1.5 oz of alcohol per week. He reports that he does not use illicit drugs.  Allergies  Allergen Reactions  . Advil [Ibuprofen] Itching  . Mobic [Meloxicam]   . Naproxen Nausea Only  . Tramadol   . Cimetidine Other (See Comments) and Rash    headaches Severe headache    Family History  Problem Relation Age of Onset  . Diabetes Mother   . Deep vein thrombosis Mother   . Pulmonary embolism Mother   . Cancer Father     hodgins lymphoma  . Diabetes Sister      Prior to Admission medications   Medication Sig Start Date End Date Taking? Authorizing Provider  albuterol (PROVENTIL HFA;VENTOLIN HFA) 108 (90 Base) MCG/ACT inhaler Inhale 1-2 puffs into the lungs every 6 (six) hours as needed for wheezing or shortness of breath.   Yes Historical Provider, MD  albuterol (PROVENTIL) (2.5 MG/3ML) 0.083% nebulizer solution Take 3 mLs (2.5 mg total) by nebulization every 6 (six) hours as needed for wheezing or shortness of breath. 11/10/13  Yes Mary-Margaret Hassell Done, FNP  aspirin EC 81 MG tablet Take 1 tablet (81 mg total) by mouth daily. 08/15/15  Yes Rogene Houston, MD  beclomethasone (QVAR) 80 MCG/ACT inhaler Inhale 1 puff into the lungs 2 (two) times daily. 03/07/14  Yes Chipper Herb, MD  butalbital-acetaminophen-caffeine (ESGIC) [phone] MG per tablet Take 1 tablet by  mouth 2 (two) times daily as needed for headache. Patient taking differently: Take 1 tablet by mouth 3 (three) times daily.  04/05/14  Yes Mary-Margaret Hassell Done, FNP  CINNAMON PO Take 1,000 mg by mouth daily.   Yes Historical Provider, MD  clonazePAM (KLONOPIN) 2 MG tablet Take 1 tablet (2 mg total) by mouth 3 (three) times daily as needed for anxiety. 04/05/14  Yes Mary-Margaret Hassell Done, FNP  clopidogrel (PLAVIX) 75 MG tablet Take 1 tablet (75 mg total) by mouth daily. 08/15/15  Yes Rogene Houston, MD  DULoxetine (CYMBALTA) 60 MG capsule Take 1 capsule (60 mg total) by mouth daily. 03/17/14  Yes Mary-Margaret Hassell Done, FNP  fluticasone (FLONASE) 50 MCG/ACT nasal spray Place 2 sprays into both nostrils daily as needed for allergies or rhinitis.  08/09/13  Yes Mary-Margaret Hassell Done, FNP  glimepiride (AMARYL) 2 MG tablet Take 1 tablet (2 mg total) by mouth 2 (two) times daily. 03/08/14  Yes Chipper Herb, MD  isosorbide mononitrate (IMDUR) 60 MG 24 hr tablet Take 1  tablet (60 mg total) by mouth daily. Patient taking differently: Take 90 mg by mouth daily.  06/29/14  Yes Mary-Margaret Hassell Done, FNP  lidocaine (XYLOCAINE) 5 % ointment Apply 1 application topically 3 (three) times daily. 08/24/15  Yes Historical Provider, MD  lisinopril (PRINIVIL,ZESTRIL) 20 MG tablet Take 1 tablet (20 mg total) by mouth daily. 11/10/13  Yes Mary-Margaret Hassell Done, FNP  losartan (COZAAR) 50 MG tablet Take 1 tablet by mouth daily. 01/31/15 01/31/16 Yes Historical Provider, MD  meclizine (ANTIVERT) 25 MG tablet TAKE ONE TABLET BY MOUTH THREE TIMES DAILY AS NEEDED FOR DIZZINESS. 12/14/13  Yes Mary-Margaret Hassell Done, FNP  metFORMIN (GLUCOPHAGE) 1000 MG tablet Take 1 tablet (1,000 mg total) by mouth 2 (two) times daily. 11/10/13  Yes Mary-Margaret Hassell Done, FNP  metoprolol tartrate (LOPRESSOR) 25 MG tablet Take 1 tablet (25 mg total) by mouth 2 (two) times daily. 01/27/14  Yes Tammy Eckard, PHARMD  nitroGLYCERIN (NITROSTAT) 0.4 MG SL tablet Place 1  tablet (0.4 mg total) under the tongue every 5 (five) minutes as needed. Patient taking differently: Place 0.4 mg under the tongue every 5 (five) minutes as needed for chest pain.  01/27/14  Yes Tammy Eckard, PHARMD  ondansetron (ZOFRAN) 8 MG tablet Take 1 tablet (8 mg total) by mouth every 8 (eight) hours as needed. for nausea Patient taking differently: Take 8 mg by mouth every 8 (eight) hours as needed for nausea or vomiting. for nausea 02/18/14  Yes Mary-Margaret Hassell Done, FNP  pantoprazole (PROTONIX) 40 MG tablet Take 1 tablet (40 mg total) by mouth daily. 11/10/13  Yes Mary-Margaret Hassell Done, FNP  promethazine (PHENERGAN) 12.5 MG tablet Take 1 tablet (12.5 mg total) by mouth 2 (two) times daily as needed for nausea or vomiting. 08/10/15  Yes Rogene Houston, MD  rosuvastatin (CRESTOR) 10 MG tablet Take 1 tablet (10 mg total) by mouth daily. 11/10/13  Yes Mary-Margaret Hassell Done, FNP  sildenafil (VIAGRA) 50 MG tablet Take 50-100 mg by mouth once a week.   Yes Historical Provider, MD  sitaGLIPtin (JANUVIA) 100 MG tablet Take 1 tablet (100 mg total) by mouth daily. 10/26/13  Yes Mary-Margaret Hassell Done, FNP  tamsulosin (FLOMAX) 0.4 MG CAPS capsule Take 1 capsule (0.4 mg total) by mouth daily. 03/17/14  Yes Mary-Margaret Hassell Done, FNP  traZODone (DESYREL) 50 MG tablet Take 50 mg by mouth at bedtime.   Yes Historical Provider, MD  zolpidem (AMBIEN) 10 MG tablet Take 1 tablet (10 mg total) by mouth at bedtime as needed for sleep. 04/05/14 09/15/15 Yes Mary-Margaret Hassell Done, FNP    Physical Exam: Filed Vitals:   09/15/15 1900 09/15/15 1916 09/15/15 1930 09/15/15 2000  BP: 113/95 113/95 112/98 118/83  Pulse:  68 67 63  Temp:      TempSrc:      Resp: 10 20 16 18   Height:      Weight:      SpO2:  98% 98% 97%      Constitutional: NAD, calm, comfortable Eyes: PERTLA, lids and conjunctivae normal ENMT: Mucous membranes are moist. Posterior pharynx clear of any exudate or lesions.   Neck: normal, supple, no  masses, no thyromegaly Respiratory: clear to auscultation bilaterally, no wheezing, no crackles. Normal respiratory effort.   Cardiovascular: S1 & S2 heard, regular rate and rhythm, no significant murmurs / rubs / gallops. No extremity edema. 2+ pedal pulses. No significant JVD. Abdomen: No distension, no tenderness, no masses palpated. Bowel sounds normal.  Musculoskeletal: no clubbing / cyanosis. No joint deformity upper and lower extremities. Normal muscle  tone.  Skin: no significant rashes, lesions, ulcers. Warm, dry, well-perfused. Neurologic: CN 2-12 grossly intact. Sensation intact, DTR normal. Strength 5/5 in all 4 limbs.  Psychiatric: Normal judgment and insight. Alert and oriented x 3. Normal mood and affect.     Labs on Admission: I have personally reviewed following labs and imaging studies  CBC:  Recent Labs Lab 09/15/15 1731  WBC 9.0  NEUTROABS 6.4  HGB 14.7  HCT 43.1  MCV 90.7  PLT 99991111   Basic Metabolic Panel:  Recent Labs Lab 09/15/15 1731  NA 138  K 3.1*  CL 106  CO2 23  GLUCOSE 122*  BUN 14  CREATININE 0.81  CALCIUM 8.8*   GFR: Estimated Creatinine Clearance: 105.7 mL/min (by C-G formula based on Cr of 0.81). Liver Function Tests: No results for input(s): AST, ALT, ALKPHOS, BILITOT, PROT, ALBUMIN in the last 168 hours. No results for input(s): LIPASE, AMYLASE in the last 168 hours. No results for input(s): AMMONIA in the last 168 hours. Coagulation Profile:  Recent Labs Lab 09/15/15 1654  INR 1.07   Cardiac Enzymes:  Recent Labs Lab 09/15/15 1731  TROPONINI <0.03   BNP (last 3 results) No results for input(s): PROBNP in the last 8760 hours. HbA1C: No results for input(s): HGBA1C in the last 72 hours. CBG: No results for input(s): GLUCAP in the last 168 hours. Lipid Profile: No results for input(s): CHOL, HDL, LDLCALC, TRIG, CHOLHDL, LDLDIRECT in the last 72 hours. Thyroid Function Tests: No results for input(s): TSH, T4TOTAL,  FREET4, T3FREE, THYROIDAB in the last 72 hours. Anemia Panel: No results for input(s): VITAMINB12, FOLATE, FERRITIN, TIBC, IRON, RETICCTPCT in the last 72 hours. Urine analysis:    Component Value Date/Time   COLORURINE YELLOW 03/18/2012 0402   APPEARANCEUR CLEAR 03/18/2012 0402   LABSPEC >1.030* 03/18/2012 0402   PHURINE 5.5 03/18/2012 0402   GLUCOSEU NEGATIVE 03/18/2012 0402   HGBUR NEGATIVE 03/18/2012 0402   BILIRUBINUR NEGATIVE 03/18/2012 0402   KETONESUR NEGATIVE 03/18/2012 0402   PROTEINUR NEGATIVE 03/18/2012 0402   UROBILINOGEN 0.2 03/18/2012 0402   NITRITE NEGATIVE 03/18/2012 0402   LEUKOCYTESUR NEGATIVE 03/18/2012 0402   Sepsis Labs: @LABRCNTIP (procalcitonin:4,lacticidven:4) )No results found for this or any previous visit (from the past 240 hour(s)).   Radiological Exams on Admission: Dg Chest Portable 1 View  09/15/2015  CLINICAL DATA:  Chest pain and abnormal EKG. EXAM: PORTABLE CHEST 1 VIEW COMPARISON:  06/05/2015 FINDINGS: Normal heart size and unchanged mediastinal contours when allowing for differences in technique. There is no edema, consolidation, effusion, or pneumothorax. No acute osseous finding. IMPRESSION: No acute finding. Electronically Signed   By: Monte Fantasia M.D.   On: 09/15/2015 16:30    EKG: Independently reviewed. Sinus rhythm, incomplete RBBB, septal Q-wave, QTc 505 ms  Assessment/Plan  1. Chest pain  - Episodic CP becoming more frequent and severe over past 2-3 mos, much more so in last 2-3 days - EKG with apparently new RBBB, initial troponin <0.03  - Pain not relieved with NTG infusion or morphine  - Cardiology consulted by EDP and advised admission to [facility], but unfortunately, they do not have available beds tonight  - Monitor in stepdown unit with continuous telemetry - Obtain serial cardiac biomarker measurements  - Continue NTG gtt with prn morphine for severe pain - Full-dose ASA chew taken PTA - Started on heparin infusion in ED,  will continue with pharmacy assistance  - Given beta-blocker and statin on admission  - Continue daily ASA 81 and  Plavix  - Check TTE  2. CAD  - History of NSTEMI in 2011 with DES to LAD - Cath in December 2013 with diffuse mild disease in LAD, mild in-stent irregularities, and moderate irregularities in 1st OM  - Managed with ASA 81, Plavix, Imdur, Lopressor, Crestor, lisinopril, and losartan  - Hold lisinopril and continue losartan; otherwise, continue current management  3. Hypertension  - At goal at time of admission  - Managed with Lopressor, lisinopril, and losartan at home  - Hold lisinopril and continue losartan and Lopressor    4. Type II DM  - A1c was 7.9% in November 2015, reflecting poor glycemic-control at that time   - Managed with metformin, glimepiride, and Januvia at home  - Hold the oral agents while in hospital - Check CBG with meals and qHS - Start with low-intensity sliding-scale insulin and adjust prn    5. COPD  - Stable at time of admission with no indication of exacerbation  - Continue current management with scheduled Qvar and prn albuterol nebs   6. Hypokalemia  - Serum potassium 3.1 on admission with uncertain etiology  - Supplemented with 20 mEq IV potassium, and 20 mEq orally  - Check mag level and replete prn   7. Anxiety, depression, insomnia  - Appears stable; pt denies SI, HI, or hallucinations  - Continue current management with Cymbalta, trazodone, Klonopin, and prn Ambien     DVT prophylaxis: Treatment-dose IV heparin   Code Status: Full Family Communication: Discussed with patient  Disposition Plan: Admit to stepdown   Consults called: Cardiology   Admission status: Inpatient    Vianne Bulls, MD [facility] Pager [phone]  If 7PM-7AM, please contact night-coverage [email or web address] Password [facility]  09/15/2015, 8:33 PM

## 2015-09-15 NOTE — ED Provider Notes (Addendum)
CSN: [id number]     Arrival date & time 09/15/15  1555 History   First MD Initiated Contact with Patient 09/15/15 1615     Chief Complaint  Patient presents with  . Chest Pain      Patient is a 55 y.o. male presenting with chest pain. The history is provided by the patient.  Chest Pain Associated symptoms: shortness of breath   Associated symptoms: no back pain and no numbness   Patient was sent in by his primary care doctor for unstable angina. Has reportedly had some chest pain over last 2 weeks but worse today. Continued dull chest pressure. States does feel somewhat like his previous coronary disease. Previously had an LAD stent. Also has had 2 caths since then with elevated troponins but reassuring heart caths with possibly some microvascular disease. No more stents. Has had pain that was not necessarily associated with exertion. Has had some shortness of breath, which has chronic issues for the patient. Occasional cough. No diaphoresis. Slight nausea. No relief of his nausea home. Previously seen at [facility] cardiology but has been at [facility] more recently. States he went by there but it seemed to busy so he came here instead. Denies substance abuse.  Past Medical History  Diagnosis Date  . Essential hypertension, benign   . Dyslipidemia   . Basal cell cancer     Chest and back - status post skin grafting  . Hepatitis B antibody positive 2006  . Viral pneumonia, unspecified 2004  . Type 2 diabetes mellitus ([place]) 2012  . Coronary artery disease     NSTEMI - DES mid LAD December 2011 (patent December 2013)  . Anxiety   . Cardiomyopathy (HCC)     LVEF 45-50%  . COPD (chronic obstructive pulmonary disease) ([place])   . MI (myocardial infarction) [facility])    Past Surgical History  Procedure Laterality Date  . Finger surgery    . Skin graft split thickness trunk    01/23/2010     Right chest;  Felicie Morn, M.D.  . Appendectomy    . Knee arthroscopy      Left  . Vasectomy     . Coronary stent placement    . Left heart catheterization with coronary angiogram N/A 03/20/2012    Procedure: LEFT HEART CATHETERIZATION WITH CORONARY ANGIOGRAM;  Surgeon: Thayer Headings, MD;  Location: [facility] CATH LAB;  Service: Cardiovascular;  Laterality: N/A;  . Colonoscopy N/A 08/10/2015    Procedure: COLONOSCOPY;  Surgeon: Rogene Houston, MD;  Location: [facility];  Service: Endoscopy;  Laterality: N/A;  730   Family History  Problem Relation Age of Onset  . Diabetes Mother   . Deep vein thrombosis Mother   . Pulmonary embolism Mother   . Cancer Father     hodgins lymphoma  . Diabetes Sister    Social History  Substance Use Topics  . Smoking status: Former Smoker -- 1.00 packs/day for 28 years    Types: Cigarettes    Quit date: 05/08/2010  . Smokeless tobacco: Former Systems developer  . Alcohol Use: 1.0 - 1.5 oz/week    2-3 Standard drinks or equivalent per week     Comment: Drinks occasional beer and wine    Review of Systems  Constitutional: Negative for appetite change.  Respiratory: Positive for shortness of breath. Negative for choking.   Cardiovascular: Positive for chest pain. Negative for leg swelling.  Genitourinary: Negative for hematuria.  Musculoskeletal: Negative for back pain.  Skin: Negative  for wound.  Neurological: Negative for numbness.  Psychiatric/Behavioral: Negative for behavioral problems.      Allergies  Advil; Mobic; Naproxen; Tramadol; and Cimetidine  Home Medications   Prior to Admission medications   Medication Sig Start Date End Date Taking? Authorizing Provider  albuterol (PROVENTIL HFA;VENTOLIN HFA) 108 (90 Base) MCG/ACT inhaler Inhale 1-2 puffs into the lungs every 6 (six) hours as needed for wheezing or shortness of breath.   Yes Historical Provider, MD  albuterol (PROVENTIL) (2.5 MG/3ML) 0.083% nebulizer solution Take 3 mLs (2.5 mg total) by nebulization every 6 (six) hours as needed for wheezing or shortness of breath. 11/10/13  Yes  Mary-Margaret Hassell Done, FNP  aspirin EC 81 MG tablet Take 1 tablet (81 mg total) by mouth daily. 08/15/15  Yes Rogene Houston, MD  beclomethasone (QVAR) 80 MCG/ACT inhaler Inhale 1 puff into the lungs 2 (two) times daily. 03/07/14  Yes Chipper Herb, MD  butalbital-acetaminophen-caffeine (ESGIC) [phone] MG per tablet Take 1 tablet by mouth 2 (two) times daily as needed for headache. Patient taking differently: Take 1 tablet by mouth 3 (three) times daily.  04/05/14  Yes Mary-Margaret Hassell Done, FNP  CINNAMON PO Take 1,000 mg by mouth daily.   Yes Historical Provider, MD  clonazePAM (KLONOPIN) 2 MG tablet Take 1 tablet (2 mg total) by mouth 3 (three) times daily as needed for anxiety. 04/05/14  Yes Mary-Margaret Hassell Done, FNP  clopidogrel (PLAVIX) 75 MG tablet Take 1 tablet (75 mg total) by mouth daily. 08/15/15  Yes Rogene Houston, MD  DULoxetine (CYMBALTA) 60 MG capsule Take 1 capsule (60 mg total) by mouth daily. 03/17/14  Yes Mary-Margaret Hassell Done, FNP  fluticasone (FLONASE) 50 MCG/ACT nasal spray Place 2 sprays into both nostrils daily as needed for allergies or rhinitis.  08/09/13  Yes Mary-Margaret Hassell Done, FNP  glimepiride (AMARYL) 2 MG tablet Take 1 tablet (2 mg total) by mouth 2 (two) times daily. 03/08/14  Yes Chipper Herb, MD  isosorbide mononitrate (IMDUR) 60 MG 24 hr tablet Take 1 tablet (60 mg total) by mouth daily. Patient taking differently: Take 90 mg by mouth daily.  06/29/14  Yes Mary-Margaret Hassell Done, FNP  lidocaine (XYLOCAINE) 5 % ointment Apply 1 application topically 3 (three) times daily. 08/24/15  Yes Historical Provider, MD  lisinopril (PRINIVIL,ZESTRIL) 20 MG tablet Take 1 tablet (20 mg total) by mouth daily. 11/10/13  Yes Mary-Margaret Hassell Done, FNP  losartan (COZAAR) 50 MG tablet Take 1 tablet by mouth daily. 01/31/15 01/31/16 Yes Historical Provider, MD  meclizine (ANTIVERT) 25 MG tablet TAKE ONE TABLET BY MOUTH THREE TIMES DAILY AS NEEDED FOR DIZZINESS. 12/14/13  Yes Mary-Margaret Hassell Done,  FNP  metFORMIN (GLUCOPHAGE) 1000 MG tablet Take 1 tablet (1,000 mg total) by mouth 2 (two) times daily. 11/10/13  Yes Mary-Margaret Hassell Done, FNP  metoprolol tartrate (LOPRESSOR) 25 MG tablet Take 1 tablet (25 mg total) by mouth 2 (two) times daily. 01/27/14  Yes Tammy Eckard, PHARMD  nitroGLYCERIN (NITROSTAT) 0.4 MG SL tablet Place 1 tablet (0.4 mg total) under the tongue every 5 (five) minutes as needed. Patient taking differently: Place 0.4 mg under the tongue every 5 (five) minutes as needed for chest pain.  01/27/14  Yes Tammy Eckard, PHARMD  ondansetron (ZOFRAN) 8 MG tablet Take 1 tablet (8 mg total) by mouth every 8 (eight) hours as needed. for nausea Patient taking differently: Take 8 mg by mouth every 8 (eight) hours as needed for nausea or vomiting. for nausea 02/18/14  Yes Mary-Margaret Hassell Done, FNP  pantoprazole (PROTONIX) 40 MG tablet Take 1 tablet (40 mg total) by mouth daily. 11/10/13  Yes Mary-Margaret Hassell Done, FNP  promethazine (PHENERGAN) 12.5 MG tablet Take 1 tablet (12.5 mg total) by mouth 2 (two) times daily as needed for nausea or vomiting. 08/10/15  Yes Rogene Houston, MD  rosuvastatin (CRESTOR) 10 MG tablet Take 1 tablet (10 mg total) by mouth daily. 11/10/13  Yes Mary-Margaret Hassell Done, FNP  sildenafil (VIAGRA) 50 MG tablet Take 50-100 mg by mouth once a week.   Yes Historical Provider, MD  sitaGLIPtin (JANUVIA) 100 MG tablet Take 1 tablet (100 mg total) by mouth daily. 10/26/13  Yes Mary-Margaret Hassell Done, FNP  tamsulosin (FLOMAX) 0.4 MG CAPS capsule Take 1 capsule (0.4 mg total) by mouth daily. 03/17/14  Yes Mary-Margaret Hassell Done, FNP  traZODone (DESYREL) 50 MG tablet Take 50 mg by mouth at bedtime.   Yes Historical Provider, MD  zolpidem (AMBIEN) 10 MG tablet Take 1 tablet (10 mg total) by mouth at bedtime as needed for sleep. 04/05/14 09/15/15 Yes Mary-Margaret Hassell Done, FNP   BP 113/95 mmHg  Pulse 68  Temp(Src) 99.5 F (37.5 C) (Oral)  Resp 20  Ht 5\' 11"  (1.803 m)  Wt 158 lb (71.668  kg)  BMI 22.05 kg/m2  SpO2 98% Physical Exam  Constitutional: He appears well-developed.  HENT:  Head: Atraumatic.  Eyes: EOM are normal.  Cardiovascular: Normal rate.   Pulmonary/Chest: Effort normal.  Abdominal: Soft.  Musculoskeletal: He exhibits no edema.  Neurological: He is alert.    ED Course  Procedures (including critical care time) Labs Review Labs Reviewed  BASIC METABOLIC PANEL - Abnormal; Notable for the following:    Potassium 3.1 (*)    Glucose, Bld 122 (*)    Calcium 8.8 (*)    All other components within normal limits  CBC WITH DIFFERENTIAL/PLATELET  TROPONIN I  APTT  PROTIME-INR  HEPARIN LEVEL (UNFRACTIONATED)  CBC  HEPARIN LEVEL (UNFRACTIONATED)    Imaging Review Dg Chest Portable 1 View  09/15/2015  CLINICAL DATA:  Chest pain and abnormal EKG. EXAM: PORTABLE CHEST 1 VIEW COMPARISON:  06/05/2015 FINDINGS: Normal heart size and unchanged mediastinal contours when allowing for differences in technique. There is no edema, consolidation, effusion, or pneumothorax. No acute osseous finding. IMPRESSION: No acute finding. Electronically Signed   By: Monte Fantasia M.D.   On: 09/15/2015 16:30   I have personally reviewed and evaluated these images and lab results as part of my medical decision-making.   EKG Interpretation   Date/Time:  Friday September 15 2015 17:05:21 EDT Ventricular Rate:  80 PR Interval:  138 QRS Duration: 134 QT Interval:  426 QTC Calculation: 491 R Axis:   171 Text Interpretation:  Sinus rhythm Consider right atrial enlargement Right  bundle branch block Baseline wander in lead(s) V4 Confirmed by Alvino Chapel   MD, Ovid Curd [phone]) on 09/15/2015 7:02:27 PM      MDM   Final diagnoses:  Unstable angina ([place])    Patient with chest pain. Sent in for unstable angina. EKG shows new right bundle branch block and some nonspecific ST changes. Continued pain. Has had similar episodes in the past. Enzymes negative this time. Discussed with  cardiology at [facility] for the patient requests and his cardiologist is, however they are full and cannot accept him in transfer for around at least 12 hours. Discussed with Dr. Wynonia Lawman also. As long as enzymes are negative he could be a medical admission. If enzymes do come positive he will likely require  sooner cath. Can transfer down to [place].    Davonna Belling, MD 09/15/15 1955  CRITICAL CARE Performed by: Mackie Pai Total critical care time: 30 minutes Critical care time was exclusive of separately billable procedures and treating other patients. Critical care was necessary to treat or prevent imminent or life-threatening deterioration. Critical care was time spent personally by me on the following activities: development of treatment plan with patient and/or surrogate as well as nursing, discussions with consultants, evaluation of patient's response to treatment, examination of patient, obtaining history from patient or surrogate, ordering and performing treatments and interventions, ordering and review of laboratory studies, ordering and review of radiographic studies, pulse oximetry and re-evaluation of patient's condition.   Davonna Belling, MD 09/15/15 2144

## 2015-09-15 NOTE — ED Notes (Signed)
Pt complain of chest pain only when taking a deep breath. EDP aware

## 2015-09-15 NOTE — ED Notes (Signed)
Pt reports that his pain is not helped by morphine save for a few minutes

## 2015-09-15 NOTE — ED Notes (Signed)
Pt reports that his cardiologist is Dr Ronn Melena at [facility] in [facility]. He reports that his pain continues in spite of pain meds and nitro

## 2015-09-15 NOTE — Progress Notes (Signed)
ANTICOAGULATION CONSULT NOTE - Initial Consult  Pharmacy Consult for Heparin Indication: chest pain/ACS  Allergies  Allergen Reactions  . Advil [Ibuprofen] Itching  . Mobic [Meloxicam]   . Naproxen Nausea Only  . Tramadol   . Cimetidine Other (See Comments) and Rash    headaches Severe headache   Patient Measurements: Height: 5\' 11"  (180.3 cm) Weight: 158 lb (71.668 kg) IBW/kg (Calculated) : 75.3  HEPARIN DW (KG): 71.7  Vital Signs: Temp: 99.5 F (37.5 C) (06/16 1604) Temp Source: Oral (06/16 1604) BP: 126/82 mmHg (06/16 1640) Pulse Rate: 77 (06/16 1640)  Labs: No results for input(s): HGB, HCT, PLT, APTT, LABPROT, INR, HEPARINUNFRC, HEPRLOWMOCWT, CREATININE, CKTOTAL, CKMB, TROPONINI in the last 72 hours.  CrCl cannot be calculated (Patient has no serum creatinine result on file.).  Medical History: Past Medical History  Diagnosis Date  . Essential hypertension, benign   . Dyslipidemia   . Basal cell cancer     Chest and back - status post skin grafting  . Hepatitis B antibody positive 2006  . Viral pneumonia, unspecified 2004  . Type 2 diabetes mellitus ([place]) 2012  . Coronary artery disease     NSTEMI - DES mid LAD December 2011 (patent December 2013)  . Anxiety   . Cardiomyopathy (HCC)     LVEF 45-50%  . COPD (chronic obstructive pulmonary disease) ([place])   . MI (myocardial infarction) [facility])    Assessment: 55yo male.  Asked to initiate Heparin for ACS.  Pt c/o CP, SOB, and abnormal EKG.   Goal of Therapy:  Heparin level 0.3-0.7 units/ml Monitor platelets by anticoagulation protocol: Yes   Plan:  Heparin 4000 units bolus now x 1 Heparin infusion at 850 units/hr Heparin level in 6-8 hrs then daily CBC daily while on Heparin  [place] Crane, Jim Lundin A 09/15/2015,5:07 PM

## 2015-09-15 NOTE — ED Notes (Signed)
Pt on phone every time this RN to room.

## 2015-09-15 NOTE — ED Notes (Signed)
Dr Alvino Chapel noticed of chest pain.  Increased NTG drip to 53mcg/min.

## 2015-09-15 NOTE — ED Notes (Signed)
Pt sent from PCP's office for chest pain and abnormal ekg.  Reports chest pain, SOB, decreased appetite, weight loss, and left arm tingling x 2 weeks.

## 2015-09-16 ENCOUNTER — Inpatient Hospital Stay (HOSPITAL_COMMUNITY): Payer: Medicare Other

## 2015-09-16 DIAGNOSIS — I200 Unstable angina: Principal | ICD-10-CM

## 2015-09-16 DIAGNOSIS — I209 Angina pectoris, unspecified: Secondary | ICD-10-CM

## 2015-09-16 LAB — GLUCOSE, CAPILLARY
Glucose-Capillary: 134 mg/dL — ABNORMAL HIGH (ref 65–99)
Glucose-Capillary: 197 mg/dL — ABNORMAL HIGH (ref 65–99)

## 2015-09-16 LAB — ECHOCARDIOGRAM COMPLETE
CHL CUP MV DEC (S): 225
E decel time: 225 msec
EERAT: 11.41
FS: 31 % (ref 28–44)
Height: 71 in
IV/PV OW: 0.93
LA diam end sys: 31 mm
LA diam index: 1.64 cm/m2
LASIZE: 31 mm
LAVOLA4C: 40 mL
LDCA: 4.15 cm2
LV E/e'average: 11.41
LV PW d: 7.5 mm — AB (ref 0.6–1.1)
LV TDI E'LATERAL: 6.16
LV e' LATERAL: 6.16 cm/s
LVEEMED: 11.41
LVOT diameter: 23 mm
MVPKAVEL: 50.9 m/s
MVPKEVEL: 70.3 m/s
TDI e' medial: 6.49
WEIGHTICAEL: 2529.12 [oz_av]

## 2015-09-16 LAB — BASIC METABOLIC PANEL
Anion gap: 7 (ref 5–15)
BUN: 16 mg/dL (ref 6–20)
CO2: 23 mmol/L (ref 22–32)
CREATININE: 0.8 mg/dL (ref 0.61–1.24)
Calcium: 8.6 mg/dL — ABNORMAL LOW (ref 8.9–10.3)
Chloride: 108 mmol/L (ref 101–111)
GFR calc Af Amer: >60 mL/min (ref >60)
GLUCOSE: 140 mg/dL — AB (ref 65–99)
POTASSIUM: 3.8 mmol/L (ref 3.5–5.1)
Sodium: 138 mmol/L (ref 135–145)

## 2015-09-16 LAB — CBC
HEMATOCRIT: 43.9 % (ref 39.0–52.0)
HEMOGLOBIN: 14.9 g/dL (ref 13.0–17.0)
MCH: 31.3 pg (ref 26.0–34.0)
MCHC: 33.9 g/dL (ref 30.0–36.0)
MCV: 92.2 fL (ref 78.0–100.0)
Platelets: 192 10*3/uL (ref 150–400)
RBC: 4.76 MIL/uL (ref 4.22–5.81)
RDW: 13.7 % (ref 11.5–15.5)
WBC: 18.7 10*3/uL — ABNORMAL HIGH (ref 4.0–10.5)

## 2015-09-16 LAB — LIPID PANEL
CHOL/HDL RATIO: 3.9 ratio
Cholesterol: 142 mg/dL (ref 0–200)
HDL: 36 mg/dL — AB (ref >40)
LDL CALC: 84 mg/dL (ref 0–99)
TRIGLYCERIDES: 112 mg/dL (ref <150)
VLDL: 22 mg/dL (ref 0–40)

## 2015-09-16 LAB — MRSA PCR SCREENING: MRSA by PCR: NEGATIVE

## 2015-09-16 LAB — TROPONIN I
Troponin I: <0.03 ng/mL (ref <0.031)
Troponin I: <0.03 ng/mL (ref <0.031)

## 2015-09-16 LAB — HEPARIN LEVEL (UNFRACTIONATED)
Heparin Unfractionated: <0.1 IU/mL — ABNORMAL LOW (ref 0.30–0.70)
Heparin Unfractionated: 0.12 IU/mL — ABNORMAL LOW (ref 0.30–0.70)
Heparin Unfractionated: 0.49 IU/mL (ref 0.30–0.70)

## 2015-09-16 MED ORDER — BUDESONIDE 0.25 MG/2ML IN SUSP: 0.2500 mg | Freq: Two times a day (BID) | RESPIRATORY_TRACT | Status: DC

## 2015-09-16 MED ORDER — MORPHINE SULFATE (PF) 2 MG/ML IV SOLN
2.0000 mg | INTRAVENOUS | Status: DC | PRN
  Administered 2015-09-16: 3 mg via INTRAVENOUS
  Filled 2015-09-16: qty 2

## 2015-09-16 MED ORDER — ACETAMINOPHEN 325 MG PO TABS: 650.0000 mg | ORAL_TABLET | ORAL | Status: DC | PRN

## 2015-09-16 MED ORDER — HEPARIN (PORCINE) IN NACL 100-0.45 UNIT/ML-% IJ SOLN: INTRAMUSCULAR | Status: DC

## 2015-09-16 MED ORDER — MORPHINE SULFATE (PF) 2 MG/ML IV SOLN: 2.0000 mg | INTRAVENOUS | Status: DC | PRN

## 2015-09-16 MED ORDER — INSULIN ASPART 100 UNIT/ML ~~LOC~~ SOLN: 0.0000 [IU] | Freq: Three times a day (TID) | SUBCUTANEOUS | Status: DC

## 2015-09-16 MED ORDER — NITROGLYCERIN IN D5W 200-5 MCG/ML-% IV SOLN: INTRAVENOUS | Status: DC

## 2015-09-16 NOTE — Progress Notes (Signed)
Orders received for pt to be discharged to [facility]. Report given to [facility] on [address] floor. Pt transported by [facility] with belongings in stable condition.

## 2015-09-16 NOTE — Progress Notes (Signed)
ANTICOAGULATION CONSULT NOTE -   Pharmacy Consult for Heparin Indication: chest pain/ACS  Allergies  Allergen Reactions  . Advil [Ibuprofen] Itching  . Mobic [Meloxicam]   . Naproxen Nausea Only  . Tramadol   . Cimetidine Other (See Comments) and Rash    headaches Severe headache   Patient Measurements: Height: 5\' 11"  (180.3 cm) Weight: 158 lb 1.1 oz (71.7 kg) IBW/kg (Calculated) : 75.3  HEPARIN DW (KG): 73.3  Vital Signs: Temp: 97.2 F (36.2 C) (06/17 1236) Temp Source: Oral (06/17 1236) BP: 114/85 mmHg (06/17 1115) Pulse Rate: 64 (06/17 1115)  Labs:  Recent Labs  09/15/15 1654  09/15/15 1731 09/15/15 2049 09/16/15 0046 09/16/15 0410 09/16/15 0414 09/16/15 0806 09/16/15 1141  HGB  --   --  14.7  --   --  14.9  --   --   --   HCT  --   --  43.1  --   --  43.9  --   --   --   PLT  --   --  206  --   --  192  --   --   --   APTT 26  --   --   --   --   --   --   --   --   LABPROT 14.1  --   --   --   --   --   --   --   --   INR 1.07  --   --   --   --   --   --   --   --   HEPARINUNFRC  --   --   --   --  <0.10* 0.12*  --   --  0.49  CREATININE  --   --  0.81  --   --  0.80  --   --   --   TROPONINI  --   < > <0.03 <0.03  --   --  <0.03 <0.03  --   < > = values in this interval not displayed.  Estimated Creatinine Clearance: 107.1 mL/min (by C-G formula based on Cr of 0.8).  Medical History: Past Medical History  Diagnosis Date  . Essential hypertension, benign   . Dyslipidemia   . Basal cell cancer     Chest and back - status post skin grafting  . Hepatitis B antibody positive 2006  . Viral pneumonia, unspecified 2004  . Type 2 diabetes mellitus ([place]) 2012  . Coronary artery disease     NSTEMI - DES mid LAD December 2011 (patent December 2013)  . Anxiety   . Cardiomyopathy (HCC)     LVEF 45-50%  . COPD (chronic obstructive pulmonary disease) ([place])   . MI (myocardial infarction) [facility])    Assessment: 55yo male.  Asked to initiate Heparin for ACS.   Pt c/o CP, SOB, and abnormal EKG.  Heparin level now therapeutic after rate increase.  No bleeding reported.  Goal of Therapy:  Heparin level 0.3-0.7 units/ml Monitor platelets by anticoagulation protocol: Yes   Plan:  Continue Heparin infusion at 1150 units/hr Heparin level daily CBC daily while on Heparin  [place] Crane, Shanai Lartigue A 09/16/2015,12:52 PM

## 2015-09-16 NOTE — Discharge Summary (Addendum)
Derek Chen, is a 55 y.o. male  DOB 1960/10/03  MRN [id number].  Admission date:  09/15/2015  Admitting Physician  Vianne Bulls, MD  Discharge Date:  09/16/2015   Primary MD  Pcp Not In System  Recommendations for  for things to follow:  - Further management per [facility] - Patient accepted by Dr Ursula Alert.  Admission Diagnosis  Unstable angina (HCC) [I20.0]   Discharge Diagnosis  Unstable angina ([place]) [I20.0]    Principal Problem:   Chest pain Active Problems:   Hyperlipidemia with target LDL less than 100   Essential hypertension, benign   Coronary atherosclerosis of native coronary artery   Diabetes (HCC)   COPD, moderate (HCC)   Anxiety   Hypokalemia   Insomnia   Depression   RBBB (right bundle branch block with left anterior fascicular block)      Past Medical History  Diagnosis Date  . Essential hypertension, benign   . Dyslipidemia   . Basal cell cancer     Chest and back - status post skin grafting  . Hepatitis B antibody positive 2006  . Viral pneumonia, unspecified 2004  . Type 2 diabetes mellitus ([place]) 2012  . Coronary artery disease     NSTEMI - DES mid LAD December 2011 (patent December 2013)  . Anxiety   . Cardiomyopathy (HCC)     LVEF 45-50%  . COPD (chronic obstructive pulmonary disease) ([place])   . MI (myocardial infarction) [facility])     Past Surgical History  Procedure Laterality Date  . Finger surgery    . Skin graft split thickness trunk    01/23/2010     Right chest;  Felicie Morn, M.D.  . Appendectomy    . Knee arthroscopy      Left  . Vasectomy    . Coronary stent placement    . Left heart catheterization with coronary angiogram N/A 03/20/2012    Procedure: LEFT HEART CATHETERIZATION WITH CORONARY ANGIOGRAM;  Surgeon: Thayer Headings, MD;  Location: [facility] CATH LAB;  Service: Cardiovascular;  Laterality: N/A;  . Colonoscopy N/A  08/10/2015    Procedure: COLONOSCOPY;  Surgeon: Rogene Houston, MD;  Location: [facility] ENDO SUITE;  Service: Endoscopy;  Laterality: N/A;  730       History of present illness and  Hospital Course:     Kindly see H&P for history of present illness and admission details, please review complete Labs, Consult reports and Test reports for all details in brief  HPI  from the history and physical done on the day of admission 09/15/2015 HPI: Derek Chen is a 55 y.o. male with medical history significant for hypertension, type 2 diabetes mellitus, coronary artery disease with stent to LAD, COPD, anxiety, depression, and insomnia who presents to the ED recurring episodes of substernal chest pain for the last 2-3 months with acute worsening. Patient has history of non-STEMI in 2011 treated with DES to LAD and has been subsequently managed with aspirin, Plavix, beta blocker, and  ARB. He had been asymptomatic until 2-3 months ago when he began having episodic chest pain for which he been taking nitroglycerin approximately once a week. Over the last 2-3 days, episodes have become much more frequent and severe. The night prior to his presentation, he took 2 nitroglycerin with no appreciable relief in his symptoms, which ultimately abated spontaneously. He saw his PCP today for routine follow-up, noted that he had been having severe substernal chest pain, denied any active pain while at the PCP office, and was evaluated in the clinic with EKG. An apparently new right bundle-branch block was noted on the EKG and the patient was advised to come to the emergency department for further evaluation. By the time of his arrival to the ED, patient was complaining of "10/10" substernal chest pain with radiation to the left arm and shoulder. He also endorses concomitant dyspnea, though attributes this to his COPD. Pain is present at rest, reportedly worsens with exertion, and his not been responding to nitroglycerin at home. Patient  denies any significant nausea, vomiting, or diarrhea. He denies diaphoresis. Patient denies recent long distance travel, prolonged immobilization, active cancer, personal or family history of DVT, or unilateral swelling or tenderness in the lower extremity.  ED Course: Upon arrival to the ED, patient is found to be afebrile, saturating well on room air, and with vital signs stable. EKG demonstrates a sinus rhythm with incomplete RBBB, septal Q-wave, and QTc of 505 ms. Troponin is undetectable and INR is 1.07. BMP is notable for hypokalemia to 3.1 and CBC is unremarkable. Chest x-ray was obtained and negative for acute cardiopulmonary disease. Patient was treated with nitroglycerin with no appreciable relief and was started on nitroglycerin infusion. Given the patient's concerning symptoms and history of non-STEMI with stent to the LAD, he was started on heparin infusion empirically. Morphine 4 mg IV was administered for pain not relieved by nitroglycerin. Cardiology was consulted by the ED physician and admission to [facility] was advised for possible cardiac catheterization the following day. Unfortunately, there are no appropriate beds available at [facility], and so the patient will be admitted to the stepdown unit here at [facility] for ongoing evaluation and management of chest pain concerning for possible ACS.   Hospital Course   Chest pain / unstable angina - Episodic CP becoming more frequent and severe over past 2-3 mos, much more so in last 2-3 days - EKG with apparently new RBBB, troponins negative 3 - Started on heparin GTT out of concern of unstable angina, given significant chest pain he was started on nitroglycerin GTT, continue with when necessary morphine for severe pain. - Continue aspirin and Plavix - Blood pressure is soft, continue with beta blockers, Cozaar, will hold lisinopril and Imdur - 2-D echo was done, but reading still pending - Patient will be  transferred to [facility] further workup by his primary cardiology team.  CAD  - History of NSTEMI in 2011 with DES to LAD - Cath in December 2013 with diffuse mild disease in LAD, mild in-stent irregularities, and moderate irregularities in 1st OM  - Managed with ASA 81, Plavix, Imdur, Lopressor, Crestor, lisinopril, and losartan  - Hold lisinopril and continue losartan; will hold Imdur giving his a nitro drip, otherwise, continue current management  Hypertension  - Blood pressure is soft giving he is a nitro drip, and when necessary morphine, will hold lisinopril and Imdur  Type II DM  - A1c was 7.9% in  November 2015, reflecting poor glycemic-control at that time  - Managed with metformin, glimepiride, and Januvia at home  - Hold the oral agents while in hospital - Check CBG with meals and qHS - Start with low-intensity sliding-scale insulin and adjust prn   COPD  - Stable  with no indication of exacerbation  - Continue current management with scheduled Qvar and prn albuterol nebs   Hypokalemia  - Serum potassium 3.1 on admission, repleted, potassium is 3.8 on discharge  Anxiety, depression, insomnia  - Appears stable; pt denies SI, HI, or hallucinations  - Continue current management with Cymbalta, trazodone, Klonopin, and prn Ambien     Discharge Condition:  Transferred to [facility] for further management       Discharge Instructions  and  Discharge Medications       Discharge Instructions    Discharge instructions    Complete by:  As directed   Diet heart healthy carb modified     Increase activity slowly    Complete by:  As directed             Medication List    STOP taking these medications        butalbital-acetaminophen-caffeine 50-325-40 MG tablet  Commonly known as:  ESGIC     CINNAMON PO     glimepiride 2 MG tablet  Commonly known as:  AMARYL     isosorbide mononitrate 60 MG 24 hr tablet    Commonly known as:  IMDUR     lidocaine 5 % ointment  Commonly known as:  XYLOCAINE     lisinopril 20 MG tablet  Commonly known as:  PRINIVIL,ZESTRIL     meclizine 25 MG tablet  Commonly known as:  ANTIVERT     metFORMIN 1000 MG tablet  Commonly known as:  GLUCOPHAGE     nitroGLYCERIN 0.4 MG SL tablet  Commonly known as:  NITROSTAT     ondansetron 8 MG tablet  Commonly known as:  ZOFRAN     sildenafil 50 MG tablet  Commonly known as:  VIAGRA     sitaGLIPtin 100 MG tablet  Commonly known as:  JANUVIA      TAKE these medications        acetaminophen 325 MG tablet  Commonly known as:  TYLENOL  Take 2 tablets (650 mg total) by mouth every 4 (four) hours as needed for headache or mild pain.     albuterol 108 (90 Base) MCG/ACT inhaler  Commonly known as:  PROVENTIL HFA;VENTOLIN HFA  Inhale 1-2 puffs into the lungs every 6 (six) hours as needed for wheezing or shortness of breath.     albuterol (2.5 MG/3ML) 0.083% nebulizer solution  Commonly known as:  PROVENTIL  Take 3 mLs (2.5 mg total) by nebulization every 6 (six) hours as needed for wheezing or shortness of breath.     aspirin EC 81 MG tablet  Take 1 tablet (81 mg total) by mouth daily.     beclomethasone 80 MCG/ACT inhaler  Commonly known as:  QVAR  Inhale 1 puff into the lungs 2 (two) times daily.     clonazePAM 2 MG tablet  Commonly known as:  KLONOPIN  Take 1 tablet (2 mg total) by mouth 3 (three) times daily as needed for anxiety.     clopidogrel 75 MG tablet  Commonly known as:  PLAVIX  Take 1 tablet (75 mg total) by mouth daily.     DULoxetine 60 MG capsule  Commonly known as:  CYMBALTA  Take 1 capsule (60 mg total) by mouth daily.     fluticasone 50 MCG/ACT nasal spray  Commonly known as:  FLONASE  Place 2 sprays into both nostrils daily as needed for allergies or rhinitis.     heparin 100-0.45 UNIT/ML-% infusion  Heparin drip as per pharmacy protocol at the accepting facility     insulin  aspart 100 UNIT/ML injection  Commonly known as:  novoLOG  Inject 0-9 Units into the skin 3 (three) times daily with meals.     losartan 50 MG tablet  Commonly known as:  COZAAR  Take 1 tablet by mouth daily.     metoprolol tartrate 25 MG tablet  Commonly known as:  LOPRESSOR  Take 1 tablet (25 mg total) by mouth 2 (two) times daily.     morphine 2 MG/ML injection  Inject 1-2 mLs (2-4 mg total) into the vein every 3 (three) hours as needed.     nitroGLYCERIN 0.2 mg/mL infusion  Measurement per pharmacy at the accepting facility     pantoprazole 40 MG tablet  Commonly known as:  PROTONIX  Take 1 tablet (40 mg total) by mouth daily.     promethazine 12.5 MG tablet  Commonly known as:  PHENERGAN  Take 1 tablet (12.5 mg total) by mouth 2 (two) times daily as needed for nausea or vomiting.     rosuvastatin 10 MG tablet  Commonly known as:  CRESTOR  Take 1 tablet (10 mg total) by mouth daily.     tamsulosin 0.4 MG Caps capsule  Commonly known as:  FLOMAX  Take 1 capsule (0.4 mg total) by mouth daily.     traZODone 50 MG tablet  Commonly known as:  DESYREL  Take 50 mg by mouth at bedtime.     zolpidem 10 MG tablet  Commonly known as:  AMBIEN  Take 1 tablet (10 mg total) by mouth at bedtime as needed for sleep.          Diet and Activity recommendation: See Discharge Instructions above   Consults obtained -  None   Major procedures and Radiology Reports - PLEASE review detailed and final reports for all details, in brief -     Dg Chest Portable 1 View  09/15/2015  CLINICAL DATA:  Chest pain and abnormal EKG. EXAM: PORTABLE CHEST 1 VIEW COMPARISON:  06/05/2015 FINDINGS: Normal heart size and unchanged mediastinal contours when allowing for differences in technique. There is no edema, consolidation, effusion, or pneumothorax. No acute osseous finding. IMPRESSION: No acute finding. Electronically Signed   By: Monte Fantasia M.D.   On: 09/15/2015 16:30    Micro  Results    Recent Results (from the past 240 hour(s))  MRSA PCR Screening     Status: None   Collection Time: 09/15/15  9:39 PM  Result Value Ref Range Status   MRSA by PCR NEGATIVE NEGATIVE Final    Comment:        The GeneXpert MRSA Assay (FDA approved for NASAL specimens only), is one component of a comprehensive MRSA colonization surveillance program. It is not intended to diagnose MRSA infection nor to guide or monitor treatment for MRSA infections.        Today   Subjective:   Derek Chen today has no headache,noabdominal pain, complaining of chest pain.  Objective:   Blood pressure 102/68, pulse 64, temperature 97.2 F (36.2 C), temperature source Oral, resp. rate 21, height 5\' 11"  (1.803 m), weight 71.7 kg (158 lb  1.1 oz), SpO2 100 %.   Intake/Output Summary (Last 24 hours) at 09/16/15 1359 Last data filed at 09/16/15 1300  Gross per 24 hour  Intake 727.36 ml  Output    650 ml  Net  77.36 ml    Exam Awake Alert, Oriented x 3,  Supple Neck,No JVD, Symmetrical Chest wall movement, Good air movement bilaterally, CTAB RRR,No Gallops,Rubs or new Murmurs, No Parasternal Heave +ve B.Sounds, Abd Soft, Non tender, No rebound -guarding or rigidity. No Cyanosis, Clubbing or edema, No new Rash or bruise  Data Review   CBC w Diff:  Lab Results  Component Value Date   WBC 18.7* 09/16/2015   HGB 14.9 09/16/2015   HCT 43.9 09/16/2015   PLT 192 09/16/2015   LYMPHOPCT 23 09/15/2015   MONOPCT 5 09/15/2015   EOSPCT 1 09/15/2015   BASOPCT 0 09/15/2015    CMP:  Lab Results  Component Value Date   NA 138 09/16/2015   NA 136 02/17/2014   K 3.8 09/16/2015   CL 108 09/16/2015   CO2 23 09/16/2015   BUN 16 09/16/2015   BUN 18 02/17/2014   CREATININE 0.80 09/16/2015   CREATININE 1.01 09/11/2012   PROT 6.7 09/15/2015   PROT 7.1 02/17/2014   ALBUMIN 3.9 09/15/2015   ALBUMIN 4.8 02/17/2014   BILITOT 0.9 09/15/2015   ALKPHOS 55 09/15/2015   AST 14*  09/15/2015   ALT 20 09/15/2015  .   Total Time in preparing paper work, data evaluation and todays exam - 35 minutes  Rishika Mccollom M.D on 09/16/2015 at 1:59 PM  [facility]   Office  [phone]

## 2015-09-16 NOTE — Progress Notes (Signed)
ANTICOAGULATION CONSULT NOTE -   Pharmacy Consult for Heparin Indication: chest pain/ACS  Allergies  Allergen Reactions  . Advil [Ibuprofen] Itching  . Mobic [Meloxicam]   . Naproxen Nausea Only  . Tramadol   . Cimetidine Other (See Comments) and Rash    headaches Severe headache   Patient Measurements: Height: 5\' 11"  (180.3 cm) Weight: 158 lb 1.1 oz (71.7 kg) IBW/kg (Calculated) : 75.3  HEPARIN DW (KG): 73.3  Vital Signs: Temp: 96.8 F (36 C) (06/17 0354) Temp Source: Oral (06/17 0354) BP: 94/58 mmHg (06/17 0700) Pulse Rate: 68 (06/17 0700)  Labs:  Recent Labs  09/15/15 1654 09/15/15 1731 09/15/15 2049 09/16/15 0046 09/16/15 0410 09/16/15 0414  HGB  --  14.7  --   --  14.9  --   HCT  --  43.1  --   --  43.9  --   PLT  --  206  --   --  192  --   APTT 26  --   --   --   --   --   LABPROT 14.1  --   --   --   --   --   INR 1.07  --   --   --   --   --   HEPARINUNFRC  --   --   --  <0.10* 0.12*  --   CREATININE  --  0.81  --   --  0.80  --   TROPONINI  --  <0.03 <0.03  --   --  <0.03    Estimated Creatinine Clearance: 107.1 mL/min (by C-G formula based on Cr of 0.8).  Medical History: Past Medical History  Diagnosis Date  . Essential hypertension, benign   . Dyslipidemia   . Basal cell cancer     Chest and back - status post skin grafting  . Hepatitis B antibody positive 2006  . Viral pneumonia, unspecified 2004  . Type 2 diabetes mellitus ([place]) 2012  . Coronary artery disease     NSTEMI - DES mid LAD December 2011 (patent December 2013)  . Anxiety   . Cardiomyopathy (HCC)     LVEF 45-50%  . COPD (chronic obstructive pulmonary disease) ([place])   . MI (myocardial infarction) [facility])    Assessment: 55yo male.  Asked to initiate Heparin for ACS.  Pt c/o CP, SOB, and abnormal EKG.  Heparin level is below goal.  Goal of Therapy:  Heparin level 0.3-0.7 units/ml Monitor platelets by anticoagulation protocol: Yes   Plan:  Increase Heparin infusion to  1150 units/hr Heparin level in 6-8 hrs then daily CBC daily while on Heparin  [place] Crane, Adelita Hone A 09/16/2015,7:52 AM

## 2015-09-16 NOTE — Progress Notes (Signed)
  Echocardiogram 2D Echocardiogram has been performed.  Derek Chen 09/16/2015, 1:52 PM

## 2015-09-18 LAB — HEMOGLOBIN A1C
Hgb A1c MFr Bld: 6.4 % — ABNORMAL HIGH (ref 4.8–5.6)
Mean Plasma Glucose: 137 mg/dL

## 2015-10-10 ENCOUNTER — Other Ambulatory Visit: Payer: Self-pay | Admitting: Nurse Practitioner

## 2015-11-15 ENCOUNTER — Encounter (HOSPITAL_COMMUNITY): Payer: Self-pay | Admitting: Emergency Medicine

## 2015-11-15 ENCOUNTER — Emergency Department (HOSPITAL_COMMUNITY): Payer: Medicare Other

## 2015-11-15 ENCOUNTER — Inpatient Hospital Stay (HOSPITAL_COMMUNITY)
Admission: EM | Admit: 2015-11-15 | Discharge: 2015-11-19 | DRG: 313 | Disposition: A | Discharge status: Home / Self Care | Payer: Medicare Other | Attending: Internal Medicine | Admitting: Internal Medicine

## 2015-11-15 DIAGNOSIS — Z85828 Personal history of other malignant neoplasm of skin: Secondary | ICD-10-CM

## 2015-11-15 DIAGNOSIS — I252 Old myocardial infarction: Secondary | ICD-10-CM

## 2015-11-15 DIAGNOSIS — F419 Anxiety disorder, unspecified: Secondary | ICD-10-CM | POA: Diagnosis present

## 2015-11-15 DIAGNOSIS — I429 Cardiomyopathy, unspecified: Secondary | ICD-10-CM | POA: Diagnosis present

## 2015-11-15 DIAGNOSIS — Z87891 Personal history of nicotine dependence: Secondary | ICD-10-CM

## 2015-11-15 DIAGNOSIS — Z955 Presence of coronary angioplasty implant and graft: Secondary | ICD-10-CM

## 2015-11-15 DIAGNOSIS — Z833 Family history of diabetes mellitus: Secondary | ICD-10-CM

## 2015-11-15 DIAGNOSIS — R0789 Other chest pain: Principal | ICD-10-CM | POA: Diagnosis present

## 2015-11-15 DIAGNOSIS — F411 Generalized anxiety disorder: Secondary | ICD-10-CM

## 2015-11-15 DIAGNOSIS — I10 Essential (primary) hypertension: Secondary | ICD-10-CM | POA: Diagnosis present

## 2015-11-15 DIAGNOSIS — E876 Hypokalemia: Secondary | ICD-10-CM | POA: Diagnosis present

## 2015-11-15 DIAGNOSIS — Z7902 Long term (current) use of antithrombotics/antiplatelets: Secondary | ICD-10-CM

## 2015-11-15 DIAGNOSIS — I2510 Atherosclerotic heart disease of native coronary artery without angina pectoris: Secondary | ICD-10-CM | POA: Diagnosis present

## 2015-11-15 DIAGNOSIS — J449 Chronic obstructive pulmonary disease, unspecified: Secondary | ICD-10-CM | POA: Diagnosis present

## 2015-11-15 DIAGNOSIS — Z7982 Long term (current) use of aspirin: Secondary | ICD-10-CM

## 2015-11-15 DIAGNOSIS — E119 Type 2 diabetes mellitus without complications: Secondary | ICD-10-CM | POA: Diagnosis present

## 2015-11-15 DIAGNOSIS — E785 Hyperlipidemia, unspecified: Secondary | ICD-10-CM | POA: Diagnosis present

## 2015-11-15 DIAGNOSIS — R079 Chest pain, unspecified: Secondary | ICD-10-CM

## 2015-11-15 DIAGNOSIS — Z794 Long term (current) use of insulin: Secondary | ICD-10-CM

## 2015-11-15 DIAGNOSIS — F329 Major depressive disorder, single episode, unspecified: Secondary | ICD-10-CM | POA: Diagnosis present

## 2015-11-15 DIAGNOSIS — Z807 Family history of other malignant neoplasms of lymphoid, hematopoietic and related tissues: Secondary | ICD-10-CM

## 2015-11-15 DIAGNOSIS — N400 Enlarged prostate without lower urinary tract symptoms: Secondary | ICD-10-CM | POA: Diagnosis present

## 2015-11-15 DIAGNOSIS — Z7951 Long term (current) use of inhaled steroids: Secondary | ICD-10-CM

## 2015-11-15 LAB — RAPID URINE DRUG SCREEN, HOSP PERFORMED
AMPHETAMINES: NOT DETECTED
BENZODIAZEPINES: POSITIVE — AB
Barbiturates: NOT DETECTED
Cocaine: NOT DETECTED
OPIATES: NOT DETECTED
Tetrahydrocannabinol: NOT DETECTED

## 2015-11-15 LAB — COMPREHENSIVE METABOLIC PANEL
ALT: 39 U/L (ref 17–63)
AST: 28 U/L (ref 15–41)
Albumin: 3.9 g/dL (ref 3.5–5.0)
Alkaline Phosphatase: 53 U/L (ref 38–126)
Anion gap: 17 — ABNORMAL HIGH (ref 5–15)
BILIRUBIN TOTAL: 0.5 mg/dL (ref 0.3–1.2)
BUN: 17 mg/dL (ref 6–20)
CHLORIDE: 99 mmol/L — AB (ref 101–111)
CO2: 20 mmol/L — ABNORMAL LOW (ref 22–32)
CREATININE: 0.99 mg/dL (ref 0.61–1.24)
Calcium: 9.2 mg/dL (ref 8.9–10.3)
Glucose, Bld: 106 mg/dL — ABNORMAL HIGH (ref 65–99)
POTASSIUM: 2.9 mmol/L — AB (ref 3.5–5.1)
Sodium: 136 mmol/L (ref 135–145)
TOTAL PROTEIN: 7 g/dL (ref 6.5–8.1)

## 2015-11-15 LAB — CBC WITH DIFFERENTIAL/PLATELET
BASOS ABS: 0 10*3/uL (ref 0.0–0.1)
BASOS PCT: 0 %
EOS ABS: 0.1 10*3/uL (ref 0.0–0.7)
Eosinophils Relative: 1 %
HCT: 48.2 % (ref 39.0–52.0)
Hemoglobin: 16.6 g/dL (ref 13.0–17.0)
Lymphocytes Relative: 21 %
Lymphs Abs: 2.7 10*3/uL (ref 0.7–4.0)
MCH: 31.9 pg (ref 26.0–34.0)
MCHC: 34.4 g/dL (ref 30.0–36.0)
MCV: 92.5 fL (ref 78.0–100.0)
MONO ABS: 0.7 10*3/uL (ref 0.1–1.0)
MONOS PCT: 6 %
NEUTROS ABS: 9.5 10*3/uL — AB (ref 1.7–7.7)
NEUTROS PCT: 72 %
Platelets: 214 10*3/uL (ref 150–400)
RBC: 5.21 MIL/uL (ref 4.22–5.81)
RDW: 13.5 % (ref 11.5–15.5)
WBC: 13 10*3/uL — ABNORMAL HIGH (ref 4.0–10.5)

## 2015-11-15 LAB — TROPONIN I

## 2015-11-15 LAB — ETHANOL: ALCOHOL ETHYL (B): 76 mg/dL — AB (ref <5)

## 2015-11-15 MED ORDER — MORPHINE SULFATE (PF) 2 MG/ML IV SOLN
2.0000 mg | INTRAVENOUS | Status: DC | PRN
  Administered 2015-11-15 – 2015-11-18 (×16): 2 mg via INTRAVENOUS
  Filled 2015-11-15 (×17): qty 1

## 2015-11-15 MED ORDER — FENTANYL CITRATE (PF) 100 MCG/2ML IJ SOLN
50.0000 ug | Freq: Once | INTRAMUSCULAR | Status: AC
  Administered 2015-11-15: 50 ug via INTRAVENOUS
  Filled 2015-11-15: qty 2

## 2015-11-15 MED ORDER — POTASSIUM CHLORIDE CRYS ER 20 MEQ PO TBCR
40.0000 meq | EXTENDED_RELEASE_TABLET | Freq: Every day | ORAL | Status: DC
  Administered 2015-11-15 – 2015-11-19 (×5): 40 meq via ORAL
  Filled 2015-11-15 (×6): qty 2

## 2015-11-15 NOTE — ED Triage Notes (Signed)
Pt c/o left sided chest pain that got worse around 1700. Pt states he has taken 2 nitro with no relief.

## 2015-11-15 NOTE — ED Provider Notes (Signed)
[facility] DEPT Provider Note   CSN: [id number] Arrival date & time: 11/15/15  2015     History   Chief Complaint Chief Complaint  Patient presents with  . Chest Pain    HPI Derek Chen is a 55 y.o. male.  The history is provided by the patient.  Patient presents with sharp left-sided chest pain. States began Saturday afternoon but has had some pain also the last few days. It is sharp. It is similar to pain he had 2 months ago but more severe. At that time he was initially admitted to the hospital and then transferred to [facility]. It was determined by [facility] that was chest wall pain at that time. Had a EF of 55% at that time. Previous LAD stent in 2011. It was patent in December 2013.  No fevers. Some shortness of breath. Occasional cough. No swelling in his legs. Patient states that it took nitroglycerin twice, Imdur, and a Klonopin to help with the pain. States the edge glycerin was 1 hour ago and 2 hours ago. States did not help the pain. Patient smells of alcohol but states that he only had one beer 6 hours ago at 3:00. He is also tearful. Denies drug use.  Past Medical History:  Diagnosis Date  . Anxiety   . Basal cell cancer    Chest and back - status post skin grafting  . Cardiomyopathy (HCC)    LVEF 45-50%  . COPD (chronic obstructive pulmonary disease) ([place])   . Coronary artery disease    NSTEMI - DES mid LAD December 2011 (patent December 2013)  . Dyslipidemia   . Essential hypertension, benign   . Hepatitis B antibody positive 2006  . MI (myocardial infarction) ([place])   . Type 2 diabetes mellitus ([place]) 2012  . Viral pneumonia, unspecified 2004    Patient Active Problem List   Diagnosis Date Noted  . Hypokalemia 09/15/2015  . Chest pain 09/15/2015  . Insomnia 09/15/2015  . Depression 09/15/2015  . 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 Non-insulin dependent type 2 diabetes mellitus ([place])   . Erectile  dysfunction associated with type 2 diabetes mellitus ([place]) 01/21/2014  . COPD, moderate ([place]) 07/15/2013  . Diabetes ([place]) 11/06/2012  . BPH (benign prostatic hyperplasia) 11/06/2012  . 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 Palpitations 03/29/2012  . Dyspnea and respiratory abnormality 03/29/2012  . Breath shortness 03/29/2012  . Acute myocardial infarction, subendocardial infarction ([place]) 03/27/2012  . Heart disease 03/27/2012  . 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 Coronary atherosclerosis 03/27/2012  . Acute non-ST segment elevation myocardial infarction ([place]) 03/27/2012  . Left ventricular dysfunction 03/27/2012  . Anxiety 03/27/2012  . Essential hypertension, benign   . Coronary atherosclerosis of native coronary artery   . Hyperlipidemia with target LDL less than 100 04/19/2010    Past Surgical History:  Procedure Laterality Date  . APPENDECTOMY    . COLONOSCOPY N/A 08/10/2015   Procedure: COLONOSCOPY;  Surgeon: Rogene Houston, MD;  Location[facility];  Service: Endoscopy;  Laterality: N/A;  730  . CORONARY STENT PLACEMENT    . FINGER SURGERY    . KNEE ARTHROSCOPY     Left  . LEFT HEART CATHETERIZATION WITH CORONARY ANGIOGRAM N/A 03/20/2012   Procedure: LEFT HEART CATHETERIZATION WITH CORONARY ANGIOGRAM;  Surgeon: Thayer Headings, MD;  Location: [facility] CATH LAB;  Service: Cardiovascular;  Laterality: N/A;  . SKIN GRAFT SPLIT THICKNESS TRUNK  01/23/2010    Right chest;  Felicie Morn, M.D.  . VASECTOMY         Home Medications    Prior to Admission medications   Medication Sig Start Date End Date Taking? Authorizing Provider  acetaminophen (TYLENOL) 325 MG tablet Take 2 tablets (650 mg total) by mouth every 4 (four) hours as needed for headache or mild pain. 09/16/15   Silver Huguenin Elgergawy, MD  albuterol (PROVENTIL HFA;VENTOLIN HFA) 108 (90 Base) MCG/ACT inhaler Inhale 1-2 puffs into the lungs every 6 (six) hours as needed for wheezing or  shortness of breath.    Historical Provider, MD  albuterol (PROVENTIL) (2.5 MG/3ML) 0.083% nebulizer solution Take 3 mLs (2.5 mg total) by nebulization every 6 (six) hours as needed for wheezing or shortness of breath. 11/10/13   Mary-Margaret Hassell Done, FNP  aspirin EC 81 MG tablet Take 1 tablet (81 mg total) by mouth daily. 08/15/15   Rogene Houston, MD  beclomethasone (QVAR) 80 MCG/ACT inhaler Inhale 1 puff into the lungs 2 (two) times daily. 03/07/14   Chipper Herb, MD  clonazePAM (KLONOPIN) 2 MG tablet Take 1 tablet (2 mg total) by mouth 3 (three) times daily as needed for anxiety. 04/05/14   Mary-Margaret Hassell Done, FNP  clopidogrel (PLAVIX) 75 MG tablet Take 1 tablet (75 mg total) by mouth daily. 08/15/15   Rogene Houston, MD  DULoxetine (CYMBALTA) 60 MG capsule Take 1 capsule (60 mg total) by mouth daily. 03/17/14   Mary-Margaret Hassell Done, FNP  fluticasone (FLONASE) 50 MCG/ACT nasal spray Place 2 sprays into both nostrils daily as needed for allergies or rhinitis.  08/09/13   Mary-Margaret Hassell Done, FNP  heparin 100-0.45 UNIT/ML-% infusion Heparin drip as per pharmacy protocol at the accepting facility 09/16/15   Albertine Patricia, MD  insulin aspart (NOVOLOG) 100 UNIT/ML injection Inject 0-9 Units into the skin 3 (three) times daily with meals. 09/16/15   Silver Huguenin Elgergawy, MD  losartan (COZAAR) 50 MG tablet Take 1 tablet by mouth daily. 01/31/15 01/31/16  Historical Provider, MD  metoprolol tartrate (LOPRESSOR) 25 MG tablet Take 1 tablet (25 mg total) by mouth 2 (two) times daily. 01/27/14   Cherre Robins, PharmD  morphine 2 MG/ML injection Inject 1-2 mLs (2-4 mg total) into the vein every 3 (three) hours as needed. 09/16/15   Silver Huguenin Elgergawy, MD  nitroGLYCERIN 0.2 mg/mL infusion Measurement per pharmacy at the accepting facility 09/16/15   Albertine Patricia, MD  pantoprazole (PROTONIX) 40 MG tablet Take 1 tablet (40 mg total) by mouth daily. 11/10/13   Mary-Margaret Hassell Done, FNP  promethazine (PHENERGAN)  12.5 MG tablet Take 1 tablet (12.5 mg total) by mouth 2 (two) times daily as needed for nausea or vomiting. 08/10/15   Rogene Houston, MD  rosuvastatin (CRESTOR) 10 MG tablet Take 1 tablet (10 mg total) by mouth daily. 11/10/13   Mary-Margaret Hassell Done, FNP  tamsulosin (FLOMAX) 0.4 MG CAPS capsule Take 1 capsule (0.4 mg total) by mouth daily. 03/17/14   Mary-Margaret Hassell Done, FNP  traZODone (DESYREL) 50 MG tablet Take 50 mg by mouth at bedtime.    Historical Provider, MD  zolpidem (AMBIEN) 10 MG tablet Take 1 tablet (10 mg total) by mouth at bedtime as needed for sleep. 04/05/14 09/15/15  Mary-Margaret Hassell Done, FNP    Family History Family History  Problem Relation Age of Onset  . Diabetes Mother   . Deep vein thrombosis Mother   . Pulmonary embolism Mother   . Cancer Father  hodgins lymphoma  . Diabetes Sister     Social History Social History  Substance Use Topics  . Smoking status: Former Smoker    Packs/day: 1.00    Years: 28.00    Types: Cigarettes    Quit date: 05/08/2010  . Smokeless tobacco: Former Systems developer  . Alcohol use 1.0 - 1.5 oz/week    2 - 3 Standard drinks or equivalent per week     Comment: Drinks occasional beer and wine     Allergies   Advil [ibuprofen]; Mobic [meloxicam]; Naproxen; Tramadol; and Cimetidine   Review of Systems Review of Systems  Constitutional: Negative for appetite change and diaphoresis.  HENT: Negative for trouble swallowing.   Eyes: Negative for photophobia.  Respiratory: Positive for shortness of breath.   Cardiovascular: Positive for chest pain.  Gastrointestinal: Negative for abdominal pain.  Genitourinary: Negative for flank pain.  Musculoskeletal: Negative for back pain.  Neurological: Negative for headaches.  Hematological: Negative for adenopathy.  Psychiatric/Behavioral: The patient is nervous/anxious.      Physical Exam Updated Vital Signs BP 109/80   Pulse 88   Temp 98.2 F (36.8 C)   Resp 18   Ht 5\' 11"  (1.803 m)    Wt 158 lb (71.7 kg)   SpO2 98%   BMI 22.04 kg/m   Physical Exam  Constitutional: He appears well-developed.  HENT:  Head: Atraumatic.  Eyes: EOM are normal.  Neck: Neck supple. No JVD present.  Pulmonary/Chest: Effort normal. He exhibits tenderness.  Tenderness to left anterior and somewhat lateral chest wall. No rash. No crepitance or deformity.  Musculoskeletal: He exhibits no edema.  Neurological: He is alert.  Skin: Capillary refill takes more than 3 seconds.  Patient has decreased capillary refill on the flushed area on his neck.  Psychiatric:  Patient is tearful. Smells of alcohol.     ED Treatments / Results  Labs (all labs ordered are listed, but only abnormal results are displayed) Labs Reviewed  COMPREHENSIVE METABOLIC PANEL - Abnormal; Notable for the following:       Result Value   Potassium 2.9 (*)    Chloride 99 (*)    CO2 20 (*)    Glucose, Bld 106 (*)    Anion gap 17 (*)    All other components within normal limits  ETHANOL - Abnormal; Notable for the following:    Alcohol, Ethyl (B) 76 (*)    All other components within normal limits  CBC WITH DIFFERENTIAL/PLATELET - Abnormal; Notable for the following:    WBC 13.0 (*)    Neutro Abs 9.5 (*)    All other components within normal limits  TROPONIN I  URINE RAPID DRUG SCREEN, HOSP PERFORMED    EKG  EKG Interpretation  Date/Time:  Wednesday November 15 2015 20:24:34 EDT Ventricular Rate:  101 PR Interval:    QRS Duration: 126 QT Interval:  371 QTC Calculation: 481 R Axis:   -128 Text Interpretation:  Sinus tachycardia Probable left atrial enlargement Right bundle branch block Confirmed by Alvino Chapel  MD, Ovid Curd [phone]) on 11/15/2015 8:29:30 PM       Radiology Dg Chest Portable 1 View  Result Date: 11/15/2015 CLINICAL DATA:  55 year old male with chest pain radiating to the back for 1 day. Initial encounter. Former smoker. EXAM: PORTABLE CHEST 1 VIEW COMPARISON:  09/15/2015 and earlier. FINDINGS:  Portable AP upright view at 2048 hours. Stable lung volumes. Mediastinal contours are stable and within normal limits. Visualized tracheal air column is within normal limits. Mild  increased interstitial markings are stable. No pneumothorax, pleural effusion or acute pulmonary opacity. IMPRESSION: No acute cardiopulmonary abnormality. Electronically Signed   By: Genevie Ann M.D.   On: 11/15/2015 21:04    Procedures Procedures (including critical care time)  Medications Ordered in ED Medications  fentaNYL (SUBLIMAZE) injection 50 mcg (not administered)     Initial Impression / Assessment and Plan / ED Course  I have reviewed the triage vital signs and the nursing notes.  Pertinent labs & imaging results that were available during my care of the patient were reviewed by me and considered in my medical decision making (see chart for details).  Clinical Course    Patient with chest pain. EKG reassuring but has right bundle blanch block. Initially had some hypotension. Smells of alcohol and level was 80 despite only having one beer 6 hours ago. Troponin negative. Chest x-ray reassuring.he does howevronary artery disease. Will admit to internal medicine.nitial hypotension potentially could be from his nitroglycerin Imdur and Klonopin that he had taken.  Final Clinical Impressions(s) / ED Diagnoses   Final diagnoses:  Chest pain, unspecified chest pain type    New Prescriptions New Prescriptions   No medications on file     Davonna Belling, MD 11/15/15 2202

## 2015-11-15 NOTE — ED Notes (Signed)
Patient called for pain medication at this time. Also aware a urine specimen is needed.

## 2015-11-15 NOTE — H&P (Signed)
History and Physical    Derek Chen [id number] DOB: 04-20-1960 DOA: 11/15/2015  Referring MD/NP/PA: Davonna Belling, MD PCP: Pcp Not In System Outpatient Specialists:   Cardiology; Eileen Stanford, MD  Patient coming from: home  Chief Complaint: Chest pain  HPI: Derek Chen is a 55 y.o. male with medical history significant of MI x3, CAD s/p stent, DM2, HTN, HLD, and COPD who presents with complaints of constant, sharp left-sided chest pain which initially onset Saturday. Pain became more severe today around 4pm. He took two NTG this afternoon with no noted improvement. Current pain is similar to pain from two months ago, though more severe, and is described as 10/10. During past chest pain rule out, patient was transferred to [facility] where it was determined he had chest wall pain. EF at that time was normal at 55%.    ED Course: While in the ED, CMP showed potassium 2.9, but was otherwise unremarkable. CBC normal, troponin negative. Tox screen positive for alcohol. CXR was negative for any acute cardiopulmonary disease. Hospitalist was asked to refer for admission for chest pain rule out.  Review of Systems: As per HPI otherwise 10 point review of systems negative.   Past Medical History:  Diagnosis Date  . Anxiety   . Basal cell cancer    Chest and back - status post skin grafting  . Cardiomyopathy (HCC)    LVEF 45-50%  . COPD (chronic obstructive pulmonary disease) ([place])   . Coronary artery disease    NSTEMI - DES mid LAD December 2011 (patent December 2013)  . Dyslipidemia   . Essential hypertension, benign   . Hepatitis B antibody positive 2006  . MI (myocardial infarction) ([place])   . Type 2 diabetes mellitus ([place]) 2012  . Viral pneumonia, unspecified 2004    Past Surgical History:  Procedure Laterality Date  . APPENDECTOMY    . COLONOSCOPY N/A 08/10/2015   Procedure: COLONOSCOPY;  Surgeon: Rogene Houston, MD;  Location[facility];  Service:  Endoscopy;  Laterality: N/A;  730  . CORONARY STENT PLACEMENT    . FINGER SURGERY    . KNEE ARTHROSCOPY     Left  . LEFT HEART CATHETERIZATION WITH CORONARY ANGIOGRAM N/A 03/20/2012   Procedure: LEFT HEART CATHETERIZATION WITH CORONARY ANGIOGRAM;  Surgeon: Thayer Headings, MD;  Location: [facility] CATH LAB;  Service: Cardiovascular;  Laterality: N/A;  . SKIN GRAFT SPLIT THICKNESS TRUNK    01/23/2010    Right chest;  Felicie Morn, M.D.  . VASECTOMY       reports that he quit smoking about 5 years ago. His smoking use included Cigarettes. He has a 28.00 pack-year smoking history. He has quit using smokeless tobacco. He reports that he drinks about 1.0 - 1.5 oz of alcohol per week . He reports that he does not use drugs.  Allergies  Allergen Reactions  . Advil [Ibuprofen] Itching  . Mobic [Meloxicam]   . Naproxen Nausea Only  . Tramadol   . Cimetidine Other (See Comments) and Rash    headaches Severe headache    Family History  Problem Relation Age of Onset  . Diabetes Mother   . Deep vein thrombosis Mother   . Pulmonary embolism Mother   . Cancer Father     hodgins lymphoma  . Diabetes Sister    Prior to Admission medications   Medication Sig Start Date End Date Taking? Authorizing Provider  acetaminophen (TYLENOL) 325 MG tablet Take 2 tablets (650 mg total)  by mouth every 4 (four) hours as needed for headache or mild pain. 09/16/15   Silver Huguenin Elgergawy, MD  albuterol (PROVENTIL HFA;VENTOLIN HFA) 108 (90 Base) MCG/ACT inhaler Inhale 1-2 puffs into the lungs every 6 (six) hours as needed for wheezing or shortness of breath.    Historical Provider, MD  albuterol (PROVENTIL) (2.5 MG/3ML) 0.083% nebulizer solution Take 3 mLs (2.5 mg total) by nebulization every 6 (six) hours as needed for wheezing or shortness of breath. 11/10/13   Mary-Margaret Hassell Done, FNP  aspirin EC 81 MG tablet Take 1 tablet (81 mg total) by mouth daily. 08/15/15   Rogene Houston, MD  beclomethasone (QVAR) 80 MCG/ACT  inhaler Inhale 1 puff into the lungs 2 (two) times daily. 03/07/14   Chipper Herb, MD  clonazePAM (KLONOPIN) 2 MG tablet Take 1 tablet (2 mg total) by mouth 3 (three) times daily as needed for anxiety. 04/05/14   Mary-Margaret Hassell Done, FNP  clopidogrel (PLAVIX) 75 MG tablet Take 1 tablet (75 mg total) by mouth daily. 08/15/15   Rogene Houston, MD  DULoxetine (CYMBALTA) 60 MG capsule Take 1 capsule (60 mg total) by mouth daily. 03/17/14   Mary-Margaret Hassell Done, FNP  fluticasone (FLONASE) 50 MCG/ACT nasal spray Place 2 sprays into both nostrils daily as needed for allergies or rhinitis.  08/09/13   Mary-Margaret Hassell Done, FNP  heparin 100-0.45 UNIT/ML-% infusion Heparin drip as per pharmacy protocol at the accepting facility 09/16/15   Albertine Patricia, MD  insulin aspart (NOVOLOG) 100 UNIT/ML injection Inject 0-9 Units into the skin 3 (three) times daily with meals. 09/16/15   Silver Huguenin Elgergawy, MD  losartan (COZAAR) 50 MG tablet Take 1 tablet by mouth daily. 01/31/15 01/31/16  Historical Provider, MD  metoprolol tartrate (LOPRESSOR) 25 MG tablet Take 1 tablet (25 mg total) by mouth 2 (two) times daily. 01/27/14   Cherre Robins, PharmD  morphine 2 MG/ML injection Inject 1-2 mLs (2-4 mg total) into the vein every 3 (three) hours as needed. 09/16/15   Silver Huguenin Elgergawy, MD  nitroGLYCERIN 0.2 mg/mL infusion Measurement per pharmacy at the accepting facility 09/16/15   Albertine Patricia, MD  pantoprazole (PROTONIX) 40 MG tablet Take 1 tablet (40 mg total) by mouth daily. 11/10/13   Mary-Margaret Hassell Done, FNP  promethazine (PHENERGAN) 12.5 MG tablet Take 1 tablet (12.5 mg total) by mouth 2 (two) times daily as needed for nausea or vomiting. 08/10/15   Rogene Houston, MD  rosuvastatin (CRESTOR) 10 MG tablet Take 1 tablet (10 mg total) by mouth daily. 11/10/13   Mary-Margaret Hassell Done, FNP  tamsulosin (FLOMAX) 0.4 MG CAPS capsule Take 1 capsule (0.4 mg total) by mouth daily. 03/17/14   Mary-Margaret Hassell Done, FNP  traZODone  (DESYREL) 50 MG tablet Take 50 mg by mouth at bedtime.    Historical Provider, MD  zolpidem (AMBIEN) 10 MG tablet Take 1 tablet (10 mg total) by mouth at bedtime as needed for sleep. 04/05/14 09/15/15  Mary-Margaret Martin, FNP    Physical Exam: Vitals:   11/15/15 2019 11/15/15 2020 11/15/15 2100 11/15/15 2130  BP:  115/80 106/75 109/80  Pulse:  89 95 88  Resp:  25 19 18   Temp:  98.2 F (36.8 C)    SpO2:  97% 95% 98%  Weight: 71.7 kg (158 lb)     Height: 5\' 11"  (1.803 m)       Constitutional: NAD, calm, comfortable Vitals:   11/15/15 2019 11/15/15 2020 11/15/15 2100 11/15/15 2130  BP:  115/80 106/75  109/80  Pulse:  89 95 88  Resp:  25 19 18   Temp:  98.2 F (36.8 C)    SpO2:  97% 95% 98%  Weight: 71.7 kg (158 lb)     Height: 5\' 11"  (1.803 m)      Eyes: PERRL, lids and conjunctivae normal ENMT: Mucous membranes are moist. Posterior pharynx clear of any exudate or lesions.Normal dentition.  Neck: normal, supple, no masses, no thyromegaly Respiratory: clear to auscultation bilaterally, no wheezing, no crackles. Normal respiratory effort. No accessory muscle use.  Cardiovascular: Regular rate and rhythm, no murmurs / rubs / gallops. No extremity edema. 2+ pedal pulses. No carotid bruits.  Abdomen: no tenderness, no masses palpated. No hepatosplenomegaly. Bowel sounds positive.  Musculoskeletal: no clubbing / cyanosis. No joint deformity upper and lower extremities. Good ROM, no contractures. Normal muscle tone.  Skin: no rashes, lesions, ulcers. No induration Neurologic: CN 2-12 grossly intact. Sensation intact, DTR normal. Strength 5/5 in all 4.  Psychiatric: Normal judgment and insight. Alert and oriented x 3. Normal mood.   Labs on Admission: I have personally reviewed following labs and imaging studies  CBC:  Recent Labs Lab 11/15/15 2101  WBC 13.0*  NEUTROABS 9.5*  HGB 16.6  HCT 48.2  MCV 92.5  PLT [id number]   Basic Metabolic Panel:  Recent Labs Lab 11/15/15 2101  NA  136  K 2.9*  CL 99*  CO2 20*  GLUCOSE 106*  BUN 17  CREATININE 0.99  CALCIUM 9.2   GFR: Estimated Creatinine Clearance: 86.5 mL/min (by C-G formula based on SCr of 0.99 mg/dL). Liver Function Tests:  Recent Labs Lab 11/15/15 2101  AST 28  ALT 39  ALKPHOS 53  BILITOT 0.5  PROT 7.0  ALBUMIN 3.9   Cardiac Enzymes:  Recent Labs Lab 11/15/15 2101  TROPONINI <0.03  Urine analysis:    Component Value Date/Time   COLORURINE YELLOW 03/18/2012 0402   APPEARANCEUR CLEAR 03/18/2012 0402   LABSPEC >1.030 (H) 03/18/2012 0402   PHURINE 5.5 03/18/2012 0402   GLUCOSEU NEGATIVE 03/18/2012 0402   HGBUR NEGATIVE 03/18/2012 0402   BILIRUBINUR NEGATIVE 03/18/2012 0402   KETONESUR NEGATIVE 03/18/2012 0402   PROTEINUR NEGATIVE 03/18/2012 0402   UROBILINOGEN 0.2 03/18/2012 0402   NITRITE NEGATIVE 03/18/2012 0402   LEUKOCYTESUR NEGATIVE 03/18/2012 0402   Radiological Exams on Admission: Dg Chest Portable 1 View  Result Date: 11/15/2015 CLINICAL DATA:  55 year old male with chest pain radiating to the back for 1 day. Initial encounter. Former smoker. EXAM: PORTABLE CHEST 1 VIEW COMPARISON:  09/15/2015 and earlier. FINDINGS: Portable AP upright view at 2048 hours. Stable lung volumes. Mediastinal contours are stable and within normal limits. Visualized tracheal air column is within normal limits. Mild increased interstitial markings are stable. No pneumothorax, pleural effusion or acute pulmonary opacity. IMPRESSION: No acute cardiopulmonary abnormality. Electronically Signed   By: Genevie Ann M.D.   On: 11/15/2015 21:04    EKG: Independently reviewed. Sinus tachycardia.  Assessment/Plan Principal Problem:   Chest pain Active Problems:   Hyperlipidemia with target LDL less than 100   Essential hypertension, benign   Diabetes ([place])   Coronary atherosclerosis   COPD, moderate ([place])  1. Chest pain rule out. EKG showed sinus tachycardia. Patient has multiple comorbidities and hx of MI x3,  though doubt STEMI. Initial troponin negative. Will cycle troponins. Consult cardiology in the AM. Pain meds as blood pressure tolerates. 2. HLD. Continue statins. 3. HTN. Continue antihypertensives. 4. DM type 2. Blood sugars have been  stable. Will hold oral agents. Start patient on SSI. 5. Hypokalemia. Potassium 2.9, will replace. 6. BPH. Stable.  7. COPD. No evidence of wheeze. Will provide bronchodilators PRN.  8. Depression/ anxiety. Continue outpatient regimen.   DVT prophylaxis: heparin Code Status: Full Family Communication: discussed with patient. No family present at bedside. Disposition Plan: admit to telemetry, discharge home once improved Consults called: Cardiology Admission status: admit as obs to telemetry  Orvan Falconer, MD [facility]  If 7PM-7AM, please contact night-coverage [email or web address] Password [email or web address]  11/15/2015, 9:49 PM   By signing my name below, I, Delene Ruffini, attest that this documentation has been prepared under the direction and in the presence of Orvan Falconer, MD. Electronically Signed: Delene Ruffini, Scribe 11/15/15 9:50pm

## 2015-11-16 ENCOUNTER — Encounter (HOSPITAL_COMMUNITY): Payer: Self-pay

## 2015-11-16 DIAGNOSIS — E1159 Type 2 diabetes mellitus with other circulatory complications: Secondary | ICD-10-CM | POA: Diagnosis not present

## 2015-11-16 DIAGNOSIS — R0789 Other chest pain: Secondary | ICD-10-CM | POA: Diagnosis not present

## 2015-11-16 DIAGNOSIS — J449 Chronic obstructive pulmonary disease, unspecified: Secondary | ICD-10-CM | POA: Diagnosis not present

## 2015-11-16 DIAGNOSIS — E785 Hyperlipidemia, unspecified: Secondary | ICD-10-CM | POA: Diagnosis not present

## 2015-11-16 DIAGNOSIS — I10 Essential (primary) hypertension: Secondary | ICD-10-CM | POA: Diagnosis not present

## 2015-11-16 LAB — BASIC METABOLIC PANEL
ANION GAP: 10 (ref 5–15)
BUN: 15 mg/dL (ref 6–20)
CALCIUM: 8.6 mg/dL — AB (ref 8.9–10.3)
CO2: 23 mmol/L (ref 22–32)
CREATININE: 0.92 mg/dL (ref 0.61–1.24)
Chloride: 103 mmol/L (ref 101–111)
GFR calc Af Amer: >60 mL/min (ref >60)
GLUCOSE: 183 mg/dL — AB (ref 65–99)
Potassium: 4.5 mmol/L (ref 3.5–5.1)
Sodium: 136 mmol/L (ref 135–145)

## 2015-11-16 LAB — GLUCOSE, CAPILLARY
Glucose-Capillary: 122 mg/dL — ABNORMAL HIGH (ref 65–99)
Glucose-Capillary: 197 mg/dL — ABNORMAL HIGH (ref 65–99)
Glucose-Capillary: 131 mg/dL — ABNORMAL HIGH (ref 65–99)

## 2015-11-16 LAB — TROPONIN I: TROPONIN I: 0.03 ng/mL — AB (ref <0.03)

## 2015-11-16 MED ORDER — LOSARTAN POTASSIUM 50 MG PO TABS
50.0000 mg | ORAL_TABLET | Freq: Every day | ORAL | Status: DC
  Administered 2015-11-16 – 2015-11-19 (×4): 50 mg via ORAL
  Filled 2015-11-16 (×5): qty 1

## 2015-11-16 MED ORDER — PANTOPRAZOLE SODIUM 40 MG PO TBEC
40.0000 mg | DELAYED_RELEASE_TABLET | Freq: Two times a day (BID) | ORAL | Status: DC
  Administered 2015-11-16 – 2015-11-19 (×7): 40 mg via ORAL
  Filled 2015-11-16 (×6): qty 1

## 2015-11-16 MED ORDER — METOPROLOL TARTRATE 25 MG PO TABS
25.0000 mg | ORAL_TABLET | Freq: Two times a day (BID) | ORAL | Status: DC
  Administered 2015-11-16 – 2015-11-19 (×8): 25 mg via ORAL
  Filled 2015-11-16 (×7): qty 1

## 2015-11-16 MED ORDER — TAMSULOSIN HCL 0.4 MG PO CAPS
0.4000 mg | ORAL_CAPSULE | Freq: Every day | ORAL | Status: DC
  Administered 2015-11-16 – 2015-11-19 (×4): 0.4 mg via ORAL
  Filled 2015-11-16 (×5): qty 1

## 2015-11-16 MED ORDER — HEPARIN SODIUM (PORCINE) 5000 UNIT/ML IJ SOLN
5000.0000 [IU] | Freq: Three times a day (TID) | INTRAMUSCULAR | Status: DC
  Administered 2015-11-16 – 2015-11-19 (×12): 5000 [IU] via SUBCUTANEOUS
  Filled 2015-11-16 (×11): qty 1

## 2015-11-16 MED ORDER — DULOXETINE HCL 60 MG PO CPEP
60.0000 mg | ORAL_CAPSULE | Freq: Every day | ORAL | Status: DC
  Administered 2015-11-16 – 2015-11-19 (×4): 60 mg via ORAL
  Filled 2015-11-16 (×5): qty 1

## 2015-11-16 MED ORDER — CLOPIDOGREL BISULFATE 75 MG PO TABS
75.0000 mg | ORAL_TABLET | Freq: Every day | ORAL | Status: DC
  Administered 2015-11-16 – 2015-11-19 (×4): 75 mg via ORAL
  Filled 2015-11-16 (×5): qty 1

## 2015-11-16 MED ORDER — PANTOPRAZOLE SODIUM 40 MG PO TBEC
40.0000 mg | DELAYED_RELEASE_TABLET | Freq: Every day | ORAL | Status: DC
  Administered 2015-11-16: 40 mg via ORAL
  Filled 2015-11-16 (×2): qty 1

## 2015-11-16 MED ORDER — BUDESONIDE 0.25 MG/2ML IN SUSP: 0.2500 mg | Freq: Two times a day (BID) | RESPIRATORY_TRACT | Status: DC

## 2015-11-16 MED ORDER — ZOLPIDEM TARTRATE 5 MG PO TABS
10.0000 mg | ORAL_TABLET | Freq: Every evening | ORAL | Status: DC | PRN
Start: 2015-11-16 — End: 2015-11-19

## 2015-11-16 MED ORDER — CLONAZEPAM 0.5 MG PO TABS
1.0000 mg | ORAL_TABLET | Freq: Three times a day (TID) | ORAL | Status: DC | PRN
  Administered 2015-11-16 – 2015-11-19 (×6): 1 mg via ORAL
  Filled 2015-11-16 (×6): qty 2

## 2015-11-16 MED ORDER — ALBUTEROL SULFATE (2.5 MG/3ML) 0.083% IN NEBU
2.5000 mg | INHALATION_SOLUTION | Freq: Every day | RESPIRATORY_TRACT | Status: DC | PRN
  Filled 2015-11-16: qty 3

## 2015-11-16 MED ORDER — ASPIRIN EC 81 MG PO TBEC
81.0000 mg | DELAYED_RELEASE_TABLET | Freq: Every day | ORAL | Status: DC
  Administered 2015-11-16 – 2015-11-19 (×4): 81 mg via ORAL
  Filled 2015-11-16 (×5): qty 1

## 2015-11-16 MED ORDER — ALBUTEROL SULFATE (2.5 MG/3ML) 0.083% IN NEBU: 3.0000 mL | INHALATION_SOLUTION | Freq: Four times a day (QID) | RESPIRATORY_TRACT | Status: DC | PRN

## 2015-11-16 MED ORDER — ROSUVASTATIN CALCIUM 10 MG PO TABS
10.0000 mg | ORAL_TABLET | Freq: Every day | ORAL | Status: DC
  Administered 2015-11-16 – 2015-11-19 (×4): 10 mg via ORAL
  Filled 2015-11-16 (×5): qty 1

## 2015-11-16 MED ORDER — TRAZODONE HCL 50 MG PO TABS
50.0000 mg | ORAL_TABLET | Freq: Every day | ORAL | Status: DC
  Administered 2015-11-16 – 2015-11-18 (×4): 50 mg via ORAL
  Filled 2015-11-16 (×3): qty 1

## 2015-11-16 MED ORDER — OXYCODONE HCL 5 MG PO TABS
10.0000 mg | ORAL_TABLET | ORAL | Status: DC | PRN
  Administered 2015-11-16 – 2015-11-19 (×12): 10 mg via ORAL
  Filled 2015-11-16 (×12): qty 2

## 2015-11-16 MED ORDER — ACETAMINOPHEN 325 MG PO TABS: 650.0000 mg | ORAL_TABLET | ORAL | Status: DC | PRN

## 2015-11-16 MED ORDER — BUTALBITAL-APAP-CAFFEINE 50-325-40 MG PO TABS: 1.0000 | ORAL_TABLET | Freq: Three times a day (TID) | ORAL | Status: DC | PRN

## 2015-11-16 MED ORDER — INSULIN ASPART 100 UNIT/ML ~~LOC~~ SOLN
0.0000 [IU] | Freq: Three times a day (TID) | SUBCUTANEOUS | Status: DC
  Administered 2015-11-16: 2 [IU] via SUBCUTANEOUS
  Administered 2015-11-17 (×2): 1 [IU] via SUBCUTANEOUS
  Administered 2015-11-18: 3 [IU] via SUBCUTANEOUS
  Administered 2015-11-18: 1 [IU] via SUBCUTANEOUS
  Administered 2015-11-18 – 2015-11-19 (×2): 2 [IU] via SUBCUTANEOUS
  Administered 2015-11-19: 1 [IU] via SUBCUTANEOUS

## 2015-11-16 MED ORDER — INSULIN ASPART 100 UNIT/ML ~~LOC~~ SOLN: 0.0000 [IU] | Freq: Every day | SUBCUTANEOUS | Status: DC

## 2015-11-16 MED ORDER — TIOTROPIUM BROMIDE MONOHYDRATE 18 MCG IN CAPS
18.0000 ug | ORAL_CAPSULE | RESPIRATORY_TRACT | Status: DC
  Administered 2015-11-16: 18 ug via RESPIRATORY_TRACT
  Filled 2015-11-16: qty 5

## 2015-11-16 MED ORDER — BUDESONIDE 0.25 MG/2ML IN SUSP
0.2500 mg | Freq: Two times a day (BID) | RESPIRATORY_TRACT | Status: DC
  Administered 2015-11-16 – 2015-11-17 (×3): 0.25 mg via RESPIRATORY_TRACT
  Filled 2015-11-16 (×6): qty 2

## 2015-11-16 MED ORDER — ALBUTEROL SULFATE (2.5 MG/3ML) 0.083% IN NEBU: 2.5000 mg | INHALATION_SOLUTION | Freq: Four times a day (QID) | RESPIRATORY_TRACT | Status: DC | PRN

## 2015-11-16 MED ORDER — FLUTICASONE PROPIONATE 50 MCG/ACT NA SUSP: 2.0000 | Freq: Every day | NASAL | Status: DC | PRN

## 2015-11-16 NOTE — Progress Notes (Signed)
Dr. Marin Comment notified of troponin level of 0.03.

## 2015-11-16 NOTE — Progress Notes (Signed)
PROGRESS NOTE    Derek Chen  [id number] DOB: [birth year] DOA: 11/15/2015 PCP: Pcp Not In System   Brief Narrative: Derek Chen is a 55 y.o. male with medical history significant of MI x3, CAD s/p stent, DM2, HTN, HLD, and COPD who presents with complaints of constant, sharp left-sided chest pain which initially onset Saturday, persistent, worsening with deep breaths and coughing, and associated back pain. Tender to palpation, he reports the pain is similar to the last chest pain episode, . He was admitted for further evaluation and cardiology consulted.   Assessment & Plan:   Principal Problem:   Chest pain Active Problems:   Hyperlipidemia with target LDL less than 100   Essential hypertension, benign   Diabetes ([place])   Coronary atherosclerosis   COPD, moderate (HCC)   Atypical chest pain   Atypical chest pain: Substernal to epigastric area, associated with worsening with deep breathing, and coughing, tender to palpation over the sternum and epigastric area.  Cardiac enzymes are negative.  Repeat EKG ordered.  Echocardiogram from June shows LVEF of 35 to 40%.  Hypokinesis of the lateral and inferior myocardium.  Last cath in 2013.  H/o DES to LAD. Currently on aspirin , plavix , bb, ARB, crestor . Resume the same Cardiology consulted and awaiting recommendations.    Diabetes mellitus; Last hgba1c is 6.4  CBG (last 3)   Recent Labs  11/16/15 1209  GLUCAP 122*    Resume SSI.    Hypertension: controlled.  Resume home meds.    COPD; No wheezing heard.  Resume bronchodilators as needed.       DVT prophylaxis: heparin.  Code Status: (Full) Family Communication: none at bedside.  Disposition Plan: pending further evaluation.    Consultants:   Cardiology.    Procedures:none    Antimicrobials:   None.   Subjective: Reports back pain and chest wall pain.   Objective: Vitals:   11/16/15 0044 11/16/15 0556 11/16/15 1016 11/16/15 1327    BP: 100/75 110/68  114/83  Pulse: 80 77  67  Resp: 18 18  18   Temp: 98.4 F (36.9 C) 98 F (36.7 C)  97.9 F (36.6 C)  TempSrc: Oral Oral  Oral  SpO2: 97% 95% 97% 98%  Weight: 71.3 kg (157 lb 3.7 oz)     Height: 5\' 11"  (1.803 m)       Intake/Output Summary (Last 24 hours) at 11/16/15 1432 Last data filed at 11/16/15 1223  Gross per 24 hour  Intake              240 ml  Output              650 ml  Net             -410 ml   Filed Weights   11/15/15 2019 11/16/15 0044  Weight: 71.7 kg (158 lb) 71.3 kg (157 lb 3.7 oz)    Examination:  General exam: Appears calm and comfortable  Respiratory system: Clear to auscultation. Respiratory effort normal. Cardiovascular system: S1 & S2 heard, RRR. No JVD, murmurs, rubs, gallops or clicks. No pedal edema. Gastrointestinal system: Abdomen is nondistended, soft and nontender. No organomegaly or masses felt. Normal bowel sounds heard. Central nervous system: Alert and oriented. No focal neurological deficits. Extremities: Symmetric 5 x 5 power. Skin: No rashes, lesions or ulcers     Data Reviewed: I have personally reviewed following labs and imaging studies  CBC:  Recent Labs Lab 11/15/15 2101  WBC 13.0*  NEUTROABS 9.5*  HGB 16.6  HCT 48.2  MCV 92.5  PLT [id number]   Basic Metabolic Panel:  Recent Labs Lab 11/15/15 2101 11/16/15 0924  NA 136 136  K 2.9* 4.5  CL 99* 103  CO2 20* 23  GLUCOSE 106* 183*  BUN 17 15  CREATININE 0.99 0.92  CALCIUM 9.2 8.6*   GFR: Estimated Creatinine Clearance: 92.6 mL/min (by C-G formula based on SCr of 0.92 mg/dL). Liver Function Tests:  Recent Labs Lab 11/15/15 2101  AST 28  ALT 39  ALKPHOS 53  BILITOT 0.5  PROT 7.0  ALBUMIN 3.9   No results for input(s): LIPASE, AMYLASE in the last 168 hours. No results for input(s): AMMONIA in the last 168 hours. Coagulation Profile: No results for input(s): INR, PROTIME in the last 168 hours. Cardiac Enzymes:  Recent Labs Lab  11/15/15 2101 11/16/15 0308 11/16/15 0924  TROPONINI <0.03 0.03* <0.03   BNP (last 3 results) No results for input(s): PROBNP in the last 8760 hours. HbA1C: No results for input(s): HGBA1C in the last 72 hours. CBG:  Recent Labs Lab 11/16/15 1209  GLUCAP 122*   Lipid Profile: No results for input(s): CHOL, HDL, LDLCALC, TRIG, CHOLHDL, LDLDIRECT in the last 72 hours. Thyroid Function Tests: No results for input(s): TSH, T4TOTAL, FREET4, T3FREE, THYROIDAB in the last 72 hours. Anemia Panel: No results for input(s): VITAMINB12, FOLATE, FERRITIN, TIBC, IRON, RETICCTPCT in the last 72 hours. Sepsis Labs: No results for input(s): PROCALCITON, LATICACIDVEN in the last 168 hours.  No results found for this or any previous visit (from the past 240 hour(s)).       Radiology Studies: Dg Chest Portable 1 View  Result Date: 11/15/2015 CLINICAL DATA:  55 year old male with chest pain radiating to the back for 1 day. Initial encounter. Former smoker. EXAM: PORTABLE CHEST 1 VIEW COMPARISON:  09/15/2015 and earlier. FINDINGS: Portable AP upright view at 2048 hours. Stable lung volumes. Mediastinal contours are stable and within normal limits. Visualized tracheal air column is within normal limits. Mild increased interstitial markings are stable. No pneumothorax, pleural effusion or acute pulmonary opacity. IMPRESSION: No acute cardiopulmonary abnormality. Electronically Signed   By: Genevie Ann M.D.   On: 11/15/2015 21:04        Scheduled Meds: . aspirin EC  81 mg Oral Daily  . budesonide  0.25 mg Inhalation BID  . clopidogrel  75 mg Oral Daily  . DULoxetine  60 mg Oral Daily  . heparin  5,000 Units Subcutaneous Q8H  . insulin aspart  0-5 Units Subcutaneous QHS  . insulin aspart  0-9 Units Subcutaneous TID WC  . losartan  50 mg Oral Daily  . metoprolol tartrate  25 mg Oral BID  . pantoprazole  40 mg Oral BID  . potassium chloride  40 mEq Oral Daily  . rosuvastatin  10 mg Oral Daily   . tamsulosin  0.4 mg Oral Daily  . tiotropium  18 mcg Inhalation Once per day on Mon Thu  . traZODone  50 mg Oral QHS   Continuous Infusions:    LOS: 0 days    Time spent: 25 minutes.     Hosie Poisson, MD [facility] Pager [phone]   If 7PM-7AM, please contact night-coverage [email or web address] Password [facility] 11/16/2015, 2:32 PM

## 2015-11-16 NOTE — Progress Notes (Signed)
**Note De-Identified  Obfuscation** EKG complete; abnormal results reported to RN

## 2015-11-16 NOTE — Care Management Obs Status (Signed)
[place] NOTIFICATION   Patient Details  Name: Derek Chen MRN: [id number] Date of Birth: [birth year]   Medicare Observation Status Notification Given:  Yes    Sherald Barge, RN 11/16/2015, 1:35 PM

## 2015-11-16 NOTE — Progress Notes (Signed)
0950 patient c/o tension headache and is requesting an order for Fioricet which is listed on his PTA medication list. MD notified with request.

## 2015-11-16 NOTE — Progress Notes (Signed)
[id number] Patient is currently NPO and is requesting to eat. MD notified.

## 2015-11-16 NOTE — Progress Notes (Signed)
Inpatient Diabetes Program Recommendations  AACE/ADA: New Consensus Statement on Inpatient Glycemic Control (2015)  Target Ranges:  Prepandial:   less than 140 mg/dL      Peak postprandial:   less than 180 mg/dL (1-2 hours)      Critically ill patients:  140 - 180 mg/dL   Results for RASSAN, AWAD (MRN [id number]) as of 11/16/2015 11:13  Ref. Range 09/16/2015 11:44  Glucose-Capillary Latest Ref Range: 65 - 99 mg/dL 197 (H)   Results for WWLLIAM, ABUKAR (MRN [id number]) as of 11/16/2015 11:13  Ref. Range 11/15/2015 21:01 11/16/2015 09:24  Glucose Latest Ref Range: 65 - 99 mg/dL 106 (H) 183 (H)   Results for FOSTER, LORD (MRN [id number]) as of 11/16/2015 11:13  Ref. Range 09/15/2015 20:49  Hemoglobin A1C Latest Ref Range: 4.8 - 5.6 % 6.4 (H)   Review of Glycemic Control  Diabetes history: DM2 Outpatient Diabetes medications: Metformin 2000 mg daily, Januvia 100 mg daily Current orders for Inpatient glycemic control: None  Inpatient Diabetes Program Recommendations: Correction (SSI): While inpatient, please consider ordering CBGs with Novolog correction scale ACHS. HgbA1C: Last A1C 6.4% on 09/15/15.  Thanks, Barnie Alderman, RN, MSN, CDE Diabetes Coordinator Inpatient Diabetes Program [phone] (Team Pager from [place] to [place]) [phone] ([facility] office) [phone] [facility] office) [phone] [facility] office)

## 2015-11-17 DIAGNOSIS — E1159 Type 2 diabetes mellitus with other circulatory complications: Secondary | ICD-10-CM | POA: Diagnosis not present

## 2015-11-17 DIAGNOSIS — R0789 Other chest pain: Secondary | ICD-10-CM | POA: Diagnosis not present

## 2015-11-17 DIAGNOSIS — I2510 Atherosclerotic heart disease of native coronary artery without angina pectoris: Secondary | ICD-10-CM

## 2015-11-17 DIAGNOSIS — I10 Essential (primary) hypertension: Secondary | ICD-10-CM | POA: Diagnosis not present

## 2015-11-17 DIAGNOSIS — R079 Chest pain, unspecified: Secondary | ICD-10-CM | POA: Diagnosis not present

## 2015-11-17 DIAGNOSIS — J449 Chronic obstructive pulmonary disease, unspecified: Secondary | ICD-10-CM | POA: Diagnosis not present

## 2015-11-17 LAB — GLUCOSE, CAPILLARY
GLUCOSE-CAPILLARY: 99 mg/dL (ref 65–99)
GLUCOSE-CAPILLARY: 126 mg/dL — AB (ref 65–99)
Glucose-Capillary: 121 mg/dL — ABNORMAL HIGH (ref 65–99)
Glucose-Capillary: 125 mg/dL — ABNORMAL HIGH (ref 65–99)

## 2015-11-17 LAB — CBC
HCT: 47.6 % (ref 39.0–52.0)
Hemoglobin: 16 g/dL (ref 13.0–17.0)
MCH: 31.3 pg (ref 26.0–34.0)
MCHC: 33.6 g/dL (ref 30.0–36.0)
MCV: 93.2 fL (ref 78.0–100.0)
PLATELETS: 165 10*3/uL (ref 150–400)
RBC: 5.11 MIL/uL (ref 4.22–5.81)
RDW: 13.3 % (ref 11.5–15.5)
WBC: 7.4 10*3/uL (ref 4.0–10.5)

## 2015-11-17 NOTE — Progress Notes (Signed)
PROGRESS NOTE    Derek Chen  [id number] DOB: 1960-12-16 DOA: 11/15/2015 PCP: Pcp Not In System   Brief Narrative: Derek Chen is a 55 y.o. male with medical history significant of MI x3, CAD s/p stent, DM2, HTN, HLD, and COPD who presents with complaints of constant, sharp left-sided chest pain which initially onset Saturday, persistent, worsening with deep breaths and coughing, and associated back pain. Tender to palpation, he reports the pain is similar to the last chest pain episode, . He was admitted for further evaluation and cardiology consulted.   Assessment & Plan:   Principal Problem:   Chest pain Active Problems:   Hyperlipidemia with target LDL less than 100   Essential hypertension, benign   Diabetes ([place])   Coronary atherosclerosis   COPD, moderate (HCC)   Atypical chest pain   Atypical chest pain: Substernal to epigastric area, associated with worsening with deep breathing, and coughing, tender to palpation over the sternum and epigastric area.  Cardiac enzymes are negative.  Repeat EKG ordered.  It shows NSR.  Echocardiogram from June shows LVEF of 35 to 40%.  Hypokinesis of the lateral and inferior myocardium.  Last cath in 2013.  H/o DES to LAD. Currently on aspirin , plavix , bb, ARB, crestor . Resume the same Cardiology consulted , suggested non cardiac chest pain.    Difficult living situation: requested social worker to help.     Diabetes mellitus; Last hgba1c is 6.4  CBG (last 3)   Recent Labs  11/16/15 2021 11/17/15 0716 11/17/15 1113  GLUCAP 131* 121* 125*    Resume SSI.    Hypertension: controlled.  Resume home meds.    COPD; No wheezing heard.  Resume bronchodilators as needed.       DVT prophylaxis: heparin.  Code Status: (Full) Family Communication: none at bedside.  Disposition Plan: pending further evaluation.    Consultants:   Cardiology.    Procedures:none    Antimicrobials:    None.   Subjective: REPORTS PAIN  IS WORSE TODAY.   Objective: Vitals:   11/16/15 1946 11/16/15 1956 11/17/15 0519 11/17/15 0948  BP:  118/79 127/85   Pulse:  72 60   Resp:   18   Temp:   98 F (36.7 C)   TempSrc:   Oral   SpO2: 96%  97% 96%  Weight:      Height:        Intake/Output Summary (Last 24 hours) at 11/17/15 1406 Last data filed at 11/17/15 0900  Gross per 24 hour  Intake              720 ml  Output             1400 ml  Net             -680 ml   Filed Weights   11/15/15 2019 11/16/15 0044  Weight: 71.7 kg (158 lb) 71.3 kg (157 lb 3.7 oz)    Examination:  General exam: Appears calm and comfortable  Respiratory system: Clear to auscultation. Respiratory effort normal. Cardiovascular system: S1 & S2 heard, RRR. No JVD, murmurs, rubs, gallops or clicks. No pedal edema. Gastrointestinal system: Abdomen is nondistended, soft and nontender. No organomegaly or masses felt. Normal bowel sounds heard. Central nervous system: Alert and oriented. No focal neurological deficits. Extremities: Symmetric 5 x 5 power. Skin: No rashes, lesions or ulcers     Data Reviewed: I have personally reviewed following labs and imaging studies  CBC:  Recent Labs Lab 11/15/15 2101 11/17/15 0705  WBC 13.0* 7.4  NEUTROABS 9.5*  --   HGB 16.6 16.0  HCT 48.2 47.6  MCV 92.5 93.2  PLT 214 [id number]   Basic Metabolic Panel:  Recent Labs Lab 11/15/15 2101 11/16/15 0924  NA 136 136  K 2.9* 4.5  CL 99* 103  CO2 20* 23  GLUCOSE 106* 183*  BUN 17 15  CREATININE 0.99 0.92  CALCIUM 9.2 8.6*   GFR: Estimated Creatinine Clearance: 92.6 mL/min (by C-G formula based on SCr of 0.92 mg/dL). Liver Function Tests:  Recent Labs Lab 11/15/15 2101  AST 28  ALT 39  ALKPHOS 53  BILITOT 0.5  PROT 7.0  ALBUMIN 3.9   No results for input(s): LIPASE, AMYLASE in the last 168 hours. No results for input(s): AMMONIA in the last 168 hours. Coagulation Profile: No results for  input(s): INR, PROTIME in the last 168 hours. Cardiac Enzymes:  Recent Labs Lab 11/15/15 2101 11/16/15 0308 11/16/15 0924  TROPONINI <0.03 0.03* <0.03   BNP (last 3 results) No results for input(s): PROBNP in the last 8760 hours. HbA1C: No results for input(s): HGBA1C in the last 72 hours. CBG:  Recent Labs Lab 11/16/15 1209 11/16/15 1638 11/16/15 2021 11/17/15 0716 11/17/15 1113  GLUCAP 122* 197* 131* 121* 125*   Lipid Profile: No results for input(s): CHOL, HDL, LDLCALC, TRIG, CHOLHDL, LDLDIRECT in the last 72 hours. Thyroid Function Tests: No results for input(s): TSH, T4TOTAL, FREET4, T3FREE, THYROIDAB in the last 72 hours. Anemia Panel: No results for input(s): VITAMINB12, FOLATE, FERRITIN, TIBC, IRON, RETICCTPCT in the last 72 hours. Sepsis Labs: No results for input(s): PROCALCITON, LATICACIDVEN in the last 168 hours.  No results found for this or any previous visit (from the past 240 hour(s)).       Radiology Studies: Dg Chest Portable 1 View  Result Date: 11/15/2015 CLINICAL DATA:  55 year old male with chest pain radiating to the back for 1 day. Initial encounter. Former smoker. EXAM: PORTABLE CHEST 1 VIEW COMPARISON:  09/15/2015 and earlier. FINDINGS: Portable AP upright view at 2048 hours. Stable lung volumes. Mediastinal contours are stable and within normal limits. Visualized tracheal air column is within normal limits. Mild increased interstitial markings are stable. No pneumothorax, pleural effusion or acute pulmonary opacity. IMPRESSION: No acute cardiopulmonary abnormality. Electronically Signed   By: Genevie Ann M.D.   On: 11/15/2015 21:04        Scheduled Meds: . aspirin EC  81 mg Oral Daily  . budesonide  0.25 mg Inhalation BID  . clopidogrel  75 mg Oral Daily  . DULoxetine  60 mg Oral Daily  . heparin  5,000 Units Subcutaneous Q8H  . insulin aspart  0-5 Units Subcutaneous QHS  . insulin aspart  0-9 Units Subcutaneous TID WC  . losartan  50  mg Oral Daily  . metoprolol tartrate  25 mg Oral BID  . pantoprazole  40 mg Oral BID  . potassium chloride  40 mEq Oral Daily  . rosuvastatin  10 mg Oral Daily  . tamsulosin  0.4 mg Oral Daily  . tiotropium  18 mcg Inhalation Once per day on Mon Thu  . traZODone  50 mg Oral QHS   Continuous Infusions:    LOS: 0 days    Time spent: 25 minutes.     Hosie Poisson, MD [facility] Pager [phone]   If 7PM-7AM, please contact night-coverage [email or web address] Password [email or web address] 11/17/2015, 2:06 PM

## 2015-11-17 NOTE — Progress Notes (Signed)
Primary cardiologist: [facility] Consulting cardiologist: Dr Carlyle Dolly  Requesting physician: Dr Collier Salina LE Indication: chest pain  Clinical Summary Mr. Derek Chen is a 55 y.o.male history of DM2, HL, CAD with prior DES to LAD in 03/2010. Admit 08/2015 at [facility] with atypical chest pain reproducible with palpation, negative workup for ACS at the time. There looks to be some disagreement between LVEFs, echos in June 2017 at [facility] reported 35-40% and the one at [facility] reportedly 50-55%. From review of notes has had chronic intermittent left chest wall pain at least since 2013 at the time of his cath. Current episode has been constant since Saturday. Constant sharp left sided chest pain worst with breathing, moving, and palpation.    Chart review mentions regular use of fioricet and klonopin at home, previous admissions have noted request for opiates only. Admit with EtOH level of 76. Written for morphine 2mg  IV q 2 hrs prn, received at total of 7 doses yesterday along with 3 doses of prn oxycodone. From review of notes has had chronic intermittent left chest wall pain at least since 2013 at the time of his cath.    2013 Cath: patent LAD stent, otherwise moderate nonobstructive disease 08/2015 echo: LVEF 35-40%, hypokinesis lateral and inferior walls 03/2012 echo: LVEF 45-50%, inferolateral and anterolateral hypokinesis 03/2012 MPI: moderate infarct with peri-infarct ischemia anterolateral wall CXR no acute process K 4.5, Cr 0.92, Hgb 16, Plt 13, EtOH 76 Trop  Negative x 3 EKG SR, RBBB, LAFB 08/2015 nuclear stress test [facility] [place]:   Allergies  Allergen Reactions  . Advil [Ibuprofen] Itching  . Mobic [Meloxicam]   . Naproxen Nausea Only  . Tramadol   . Cimetidine Other (See Comments) and Rash    headaches Severe headache    Medications Scheduled Medications: . aspirin EC  81 mg Oral Daily  . budesonide  0.25 mg Inhalation BID  . clopidogrel  75 mg Oral Daily  .  DULoxetine  60 mg Oral Daily  . heparin  5,000 Units Subcutaneous Q8H  . insulin aspart  0-5 Units Subcutaneous QHS  . insulin aspart  0-9 Units Subcutaneous TID WC  . losartan  50 mg Oral Daily  . metoprolol tartrate  25 mg Oral BID  . pantoprazole  40 mg Oral BID  . potassium chloride  40 mEq Oral Daily  . rosuvastatin  10 mg Oral Daily  . tamsulosin  0.4 mg Oral Daily  . tiotropium  18 mcg Inhalation Once per day on Mon Thu  . traZODone  50 mg Oral QHS     Infusions:     PRN Medications:  acetaminophen, albuterol, albuterol, albuterol, clonazePAM, fluticasone, morphine, oxyCODONE, zolpidem   Past Medical History:  Diagnosis Date  . Anxiety   . Basal cell cancer    Chest and back - status post skin grafting  . Cardiomyopathy (HCC)    LVEF 45-50%  . COPD (chronic obstructive pulmonary disease) ([place])   . Coronary artery disease    NSTEMI - DES mid LAD December 2011 (patent December 2013)  . Dyslipidemia   . Essential hypertension, benign   . Hepatitis B antibody positive 2006  . MI (myocardial infarction) ([place])   . Type 2 diabetes mellitus ([place]) 2012  . Viral pneumonia, unspecified 2004    Past Surgical History:  Procedure Laterality Date  . APPENDECTOMY    . COLONOSCOPY N/A 08/10/2015   Procedure: COLONOSCOPY;  Surgeon: Rogene Houston, MD;  Location: [facility];  Service: Endoscopy;  Laterality: N/A;  730  . CORONARY STENT PLACEMENT    . FINGER SURGERY    . KNEE ARTHROSCOPY     Left  . LEFT HEART CATHETERIZATION WITH CORONARY ANGIOGRAM N/A 03/20/2012   Procedure: LEFT HEART CATHETERIZATION WITH CORONARY ANGIOGRAM;  Surgeon: Thayer Headings, MD;  Location: [facility] CATH LAB;  Service: Cardiovascular;  Laterality: N/A;  . SKIN GRAFT SPLIT THICKNESS TRUNK    01/23/2010    Right chest;  Felicie Morn, M.D.  . VASECTOMY      Family History  Problem Relation Age of Onset  . Diabetes Mother   . Deep vein thrombosis Mother   . Pulmonary embolism Mother   .  Cancer Father     hodgins lymphoma  . Diabetes Sister     Social History Mr. Benthall reports that he quit smoking about 5 years ago. His smoking use included Cigarettes. He has a 28.00 pack-year smoking history. He has quit using smokeless tobacco. Mr. Fiebiger reports that he drinks about 1.0 - 1.5 oz of alcohol per week .  Review of Systems CONSTITUTIONAL: No weight loss, fever, chills, weakness or fatigue.  HEENT: Eyes: No visual loss, blurred vision, double vision or yellow sclerae. No hearing loss, sneezing, congestion, runny nose or sore throat.  SKIN: No rash or itching.  CARDIOVASCULAR: per HPI RESPIRATORY: No shortness of breath, cough or sputum.  GASTROINTESTINAL: No anorexia, nausea, vomiting or diarrhea. No abdominal pain or blood.  GENITOURINARY: no polyuria, no dysuria NEUROLOGICAL: No headache, dizziness, syncope, paralysis, ataxia, numbness or tingling in the extremities. No change in bowel or bladder control.  MUSCULOSKELETAL: No muscle, back pain, joint pain or stiffness.  HEMATOLOGIC: No anemia, bleeding or bruising.  LYMPHATICS: No enlarged nodes. No history of splenectomy.  PSYCHIATRIC: No history of depression or anxiety.      Physical Examination Blood pressure 127/85, pulse 60, temperature 98 F (36.7 C), temperature source Oral, resp. rate 18, height 5\' 11"  (1.803 m), weight 157 lb 3.7 oz (71.3 kg), SpO2 97 %.  Intake/Output Summary (Last 24 hours) at 11/17/15 0944 Last data filed at 11/17/15 0900  Gross per 24 hour  Intake              960 ml  Output             1750 ml  Net             -790 ml    HEENT: sclera clear, throat clear  Cardiovascular: RRR, no m/r/g, no jvd  Respiratory: CTAB  GI: abdomen soft, NT, ND  MSK: left chest wall tender to palpation  Neuro: no focal deficits  Psych: appropriate affects   Lab Results  Basic Metabolic Panel:  Recent Labs Lab 11/15/15 2101 11/16/15 0924  NA 136 136  K 2.9* 4.5  CL 99* 103  CO2 20*  23  GLUCOSE 106* 183*  BUN 17 15  CREATININE 0.99 0.92  CALCIUM 9.2 8.6*    Liver Function Tests:  Recent Labs Lab 11/15/15 2101  AST 28  ALT 39  ALKPHOS 53  BILITOT 0.5  PROT 7.0  ALBUMIN 3.9    CBC:  Recent Labs Lab 11/15/15 2101 11/17/15 0705  WBC 13.0* 7.4  NEUTROABS 9.5*  --   HGB 16.6 16.0  HCT 48.2 47.6  MCV 92.5 93.2  PLT 214 165    Cardiac Enzymes:  Recent Labs Lab 11/15/15 2101 11/16/15 0308 11/16/15 0924  TROPONINI <0.03 0.03* <0.03  BNP: Invalid input(s): POCBNP     Impression/Recommendations 1. Noncardiac chest pain - noncardiac pain constantly nearly 1 week, positional, worst with breathing and palpation. Ongoing for several years on and off - no objective evidence of cardiac ischemia by EKG or enzymes - no plans for ischemic cardiac testing at time. He may f/u with his outpatient cardiologist.  - elements of his medical record are concerning for polysubstance and possible drug seeking, I would be leary of refilling any of his home opiates or benzos at discharge.     We will sign off inpatient care.    Carlyle Dolly, M.D.

## 2015-11-17 NOTE — Progress Notes (Signed)
1157 Patient requesting to speak with hospitalist, MD notified with request.

## 2015-11-18 DIAGNOSIS — Z955 Presence of coronary angioplasty implant and graft: Secondary | ICD-10-CM | POA: Diagnosis not present

## 2015-11-18 DIAGNOSIS — J449 Chronic obstructive pulmonary disease, unspecified: Secondary | ICD-10-CM | POA: Diagnosis present

## 2015-11-18 DIAGNOSIS — Z7951 Long term (current) use of inhaled steroids: Secondary | ICD-10-CM | POA: Diagnosis not present

## 2015-11-18 DIAGNOSIS — I252 Old myocardial infarction: Secondary | ICD-10-CM | POA: Diagnosis not present

## 2015-11-18 DIAGNOSIS — F329 Major depressive disorder, single episode, unspecified: Secondary | ICD-10-CM | POA: Diagnosis present

## 2015-11-18 DIAGNOSIS — Z85828 Personal history of other malignant neoplasm of skin: Secondary | ICD-10-CM | POA: Diagnosis not present

## 2015-11-18 DIAGNOSIS — E119 Type 2 diabetes mellitus without complications: Secondary | ICD-10-CM | POA: Diagnosis present

## 2015-11-18 DIAGNOSIS — Z807 Family history of other malignant neoplasms of lymphoid, hematopoietic and related tissues: Secondary | ICD-10-CM | POA: Diagnosis not present

## 2015-11-18 DIAGNOSIS — E785 Hyperlipidemia, unspecified: Secondary | ICD-10-CM | POA: Diagnosis present

## 2015-11-18 DIAGNOSIS — Z833 Family history of diabetes mellitus: Secondary | ICD-10-CM | POA: Diagnosis not present

## 2015-11-18 DIAGNOSIS — I10 Essential (primary) hypertension: Secondary | ICD-10-CM | POA: Diagnosis present

## 2015-11-18 DIAGNOSIS — Z794 Long term (current) use of insulin: Secondary | ICD-10-CM | POA: Diagnosis not present

## 2015-11-18 DIAGNOSIS — Z7902 Long term (current) use of antithrombotics/antiplatelets: Secondary | ICD-10-CM | POA: Diagnosis not present

## 2015-11-18 DIAGNOSIS — I2510 Atherosclerotic heart disease of native coronary artery without angina pectoris: Secondary | ICD-10-CM | POA: Diagnosis present

## 2015-11-18 DIAGNOSIS — F419 Anxiety disorder, unspecified: Secondary | ICD-10-CM | POA: Diagnosis present

## 2015-11-18 DIAGNOSIS — Z7982 Long term (current) use of aspirin: Secondary | ICD-10-CM | POA: Diagnosis not present

## 2015-11-18 DIAGNOSIS — I429 Cardiomyopathy, unspecified: Secondary | ICD-10-CM | POA: Diagnosis present

## 2015-11-18 DIAGNOSIS — N400 Enlarged prostate without lower urinary tract symptoms: Secondary | ICD-10-CM | POA: Diagnosis present

## 2015-11-18 DIAGNOSIS — R0789 Other chest pain: Secondary | ICD-10-CM | POA: Diagnosis present

## 2015-11-18 DIAGNOSIS — E876 Hypokalemia: Secondary | ICD-10-CM | POA: Diagnosis present

## 2015-11-18 DIAGNOSIS — Z87891 Personal history of nicotine dependence: Secondary | ICD-10-CM | POA: Diagnosis not present

## 2015-11-18 LAB — GLUCOSE, CAPILLARY
Glucose-Capillary: 121 mg/dL — ABNORMAL HIGH (ref 65–99)
Glucose-Capillary: 159 mg/dL — ABNORMAL HIGH (ref 65–99)
Glucose-Capillary: 209 mg/dL — ABNORMAL HIGH (ref 65–99)
Glucose-Capillary: 157 mg/dL — ABNORMAL HIGH (ref 65–99)

## 2015-11-18 LAB — HEMOGLOBIN A1C
Hgb A1c MFr Bld: 5.8 % — ABNORMAL HIGH (ref 4.8–5.6)
MEAN PLASMA GLUCOSE: 120 mg/dL

## 2015-11-18 MED ORDER — LORAZEPAM 1 MG PO TABS
2.0000 mg | ORAL_TABLET | Freq: Once | ORAL | Status: AC
  Administered 2015-11-18: 2 mg via ORAL
  Filled 2015-11-18: qty 2

## 2015-11-18 MED ORDER — PROMETHAZINE HCL 12.5 MG PO TABS
12.5000 mg | ORAL_TABLET | Freq: Four times a day (QID) | ORAL | Status: DC | PRN
  Administered 2015-11-18 – 2015-11-19 (×2): 12.5 mg via ORAL
  Filled 2015-11-18 (×2): qty 1

## 2015-11-18 MED ORDER — DULOXETINE HCL 30 MG PO CPEP
30.0000 mg | ORAL_CAPSULE | Freq: Every day | ORAL | Status: DC
  Administered 2015-11-18: 30 mg via ORAL
  Filled 2015-11-18: qty 1

## 2015-11-18 NOTE — Progress Notes (Signed)
PROGRESS NOTE    Derek Chen  [id number] DOB: 03-17-1961 DOA: 11/15/2015 PCP: Pcp Not In System   Brief Narrative: Derek Chen is a 55 y.o. male with medical history significant of MI x3, CAD s/p stent, DM2, HTN, HLD, and COPD who presents with complaints of constant, sharp left-sided chest pain which initially onset Saturday, persistent, worsening with deep breaths and coughing, and associated back pain. Tender to palpation, he reports the pain is similar to the last chest pain episode, . He was admitted for further evaluation and cardiology consulted.   Assessment & Plan:   Principal Problem:   Chest pain Active Problems:   Hyperlipidemia with target LDL less than 100   Essential hypertension, benign   Diabetes ([place])   Coronary atherosclerosis   COPD, moderate (HCC)   Atypical chest pain   Atypical chest pain: Substernal to epigastric area, associated with worsening with deep breathing, and coughing, tender to palpation over the sternum and epigastric area.  Cardiac enzymes are negative.  Repeat EKG ordered.  It shows NSR.  Echocardiogram from June shows LVEF of 35 to 40%.  Hypokinesis of the lateral and inferior myocardium.  Last cath in 2013.  H/o DES to LAD. Currently on aspirin , plavix , bb, ARB, crestor . Resume the same Cardiology consulted , suggested non cardiac chest pain.  No further recommendations.    Depression: crying , reports he is very depressed, denies suicidal ideation. Requesting for help with counseling.  Increased cymbalta to 90 mg daily and added ativan.  Requested psychiatry consult for medication assistance and to see if he is candidate for inpatient psychiatry hospitalization.    Difficult living situation: requested social worker to help.     Diabetes mellitus; Last hgba1c is 6.4  CBG (last 3)   Recent Labs  11/18/15 0742 11/18/15 1136 11/18/15 1736  GLUCAP 121* 159* 209*    Resume SSI.    Hypertension: controlled.    Resume home meds.    COPD; No wheezing heard.  Resume bronchodilators as needed.       DVT prophylaxis: heparin.  Code Status: (Full) Family Communication: none at bedside.  Disposition Plan: pending further evaluation.    Consultants:   Cardiology.   Psychiatry.    Procedures:none    Antimicrobials:   None.   Subjective: Crying and reports he is very sad and depressed, wants   Objective: Vitals:   11/17/15 2136 11/18/15 0504 11/18/15 1013 11/18/15 1456  BP:  114/71 125/72 111/69  Pulse:  60 63 79  Resp:  20  18  Temp:  97.5 F (36.4 C)  97.7 F (36.5 C)  TempSrc:  Oral  Oral  SpO2: 98% 99%  97%  Weight:      Height:        Intake/Output Summary (Last 24 hours) at 11/18/15 1847 Last data filed at 11/18/15 1240  Gross per 24 hour  Intake              480 ml  Output              300 ml  Net              180 ml   Filed Weights   11/15/15 2019 11/16/15 0044  Weight: 71.7 kg (158 lb) 71.3 kg (157 lb 3.7 oz)    Examination:  General exam: Appears calm and comfortable  Respiratory system: Clear to auscultation. Respiratory effort normal. Cardiovascular system: S1 & S2 heard, RRR. No  JVD, murmurs, rubs, gallops or clicks. No pedal edema. Gastrointestinal system: Abdomen is nondistended, soft and nontender. No organomegaly or masses felt. Normal bowel sounds heard. Central nervous system: Alert and oriented. No focal neurological deficits. Extremities: Symmetric 5 x 5 power. Skin: No rashes, lesions or ulcers Psychiatry: crying and apologizing.      Data Reviewed: I have personally reviewed following labs and imaging studies  CBC:  Recent Labs Lab 11/15/15 2101 11/17/15 0705  WBC 13.0* 7.4  NEUTROABS 9.5*  --   HGB 16.6 16.0  HCT 48.2 47.6  MCV 92.5 93.2  PLT 214 [id number]   Basic Metabolic Panel:  Recent Labs Lab 11/15/15 2101 11/16/15 0924  NA 136 136  K 2.9* 4.5  CL 99* 103  CO2 20* 23  GLUCOSE 106* 183*  BUN 17 15   CREATININE 0.99 0.92  CALCIUM 9.2 8.6*   GFR: Estimated Creatinine Clearance: 92.6 mL/min (by C-G formula based on SCr of 0.92 mg/dL). Liver Function Tests:  Recent Labs Lab 11/15/15 2101  AST 28  ALT 39  ALKPHOS 53  BILITOT 0.5  PROT 7.0  ALBUMIN 3.9   No results for input(s): LIPASE, AMYLASE in the last 168 hours. No results for input(s): AMMONIA in the last 168 hours. Coagulation Profile: No results for input(s): INR, PROTIME in the last 168 hours. Cardiac Enzymes:  Recent Labs Lab 11/15/15 2101 11/16/15 0308 11/16/15 0924  TROPONINI <0.03 0.03* <0.03   BNP (last 3 results) No results for input(s): PROBNP in the last 8760 hours. HbA1C:  Recent Labs  11/17/15 0705  HGBA1C 5.8*   CBG:  Recent Labs Lab 11/17/15 1638 11/17/15 2040 11/18/15 0742 11/18/15 1136 11/18/15 1736  GLUCAP 99 126* 121* 159* 209*   Lipid Profile: No results for input(s): CHOL, HDL, LDLCALC, TRIG, CHOLHDL, LDLDIRECT in the last 72 hours. Thyroid Function Tests: No results for input(s): TSH, T4TOTAL, FREET4, T3FREE, THYROIDAB in the last 72 hours. Anemia Panel: No results for input(s): VITAMINB12, FOLATE, FERRITIN, TIBC, IRON, RETICCTPCT in the last 72 hours. Sepsis Labs: No results for input(s): PROCALCITON, LATICACIDVEN in the last 168 hours.  No results found for this or any previous visit (from the past 240 hour(s)).       Radiology Studies: No results found.      Scheduled Meds: . aspirin EC  81 mg Oral Daily  . budesonide  0.25 mg Inhalation BID  . clopidogrel  75 mg Oral Daily  . DULoxetine  30 mg Oral QHS  . DULoxetine  60 mg Oral Daily  . heparin  5,000 Units Subcutaneous Q8H  . insulin aspart  0-5 Units Subcutaneous QHS  . insulin aspart  0-9 Units Subcutaneous TID WC  . losartan  50 mg Oral Daily  . metoprolol tartrate  25 mg Oral BID  . pantoprazole  40 mg Oral BID  . potassium chloride  40 mEq Oral Daily  . rosuvastatin  10 mg Oral Daily  .  tamsulosin  0.4 mg Oral Daily  . tiotropium  18 mcg Inhalation Once per day on Mon Thu  . traZODone  50 mg Oral QHS   Continuous Infusions:    LOS: 0 days    Time spent: 25 minutes.     Hosie Poisson, MD [facility] Pager [phone]   If 7PM-7AM, please contact night-coverage [email or web address] Password [facility] 11/18/2015, 6:47 PM

## 2015-11-19 LAB — GLUCOSE, CAPILLARY
GLUCOSE-CAPILLARY: 155 mg/dL — AB (ref 65–99)
Glucose-Capillary: 132 mg/dL — ABNORMAL HIGH (ref 65–99)

## 2015-11-19 MED ORDER — OXYCODONE HCL 10 MG PO TABS: 10.0000 mg | ORAL_TABLET | Freq: Three times a day (TID) | ORAL | 0 refills | Status: DC | PRN

## 2015-11-19 MED ORDER — DULOXETINE HCL 30 MG PO CPEP: 30.0000 mg | ORAL_CAPSULE | Freq: Every day | ORAL | 0 refills | Status: DC

## 2015-11-19 MED ORDER — BUTALBITAL-APAP-CAFFEINE 50-325-40 MG PO TABS: 1.0000 | ORAL_TABLET | Freq: Three times a day (TID) | ORAL | 0 refills | Status: DC | PRN

## 2015-11-19 MED ORDER — CLONAZEPAM 2 MG PO TABS: 1.0000 mg | ORAL_TABLET | Freq: Three times a day (TID) | ORAL | 0 refills | Status: DC | PRN

## 2015-11-19 NOTE — Discharge Summary (Signed)
Physician Discharge Summary  Derek Chen [id number] DOB: Oct 03, 1960 DOA: 11/15/2015  PCP: Pcp Not In System  Admit date: 11/15/2015 Discharge date: 11/19/2015  Admitted From: [facility].  Disposition:  Home   Recommendations for Outpatient Follow-up:  1. Follow up with PCP in 1-2 weeks 2. Please obtain BMP/CBC in one week 3. Please follow up with psychiatry as recommended.     Discharge Condition: stable.  CODE STATUS:full code.  Diet recommendation: Heart Healthy   Brief/Interim Summary: Derek Chen a 55 y.o.malewith medical history significant of MI x3, CAD s/p stent, DM2, HTN, HLD, and COPD who presents with complaints of constant, sharp left-sided chest pain which initially onset Saturday, persistent, worsening with deep breaths and coughing, and associated back pain. Tender to palpation, he reports the pain is similar to the last chest pain episode, . He was admitted for further evaluation and cardiology consulted.   Discharge Diagnoses:  Principal Problem:   Chest pain Active Problems:   Hyperlipidemia with target LDL less than 100   Essential hypertension, benign   Diabetes ([place])   Coronary atherosclerosis   COPD, moderate (HCC)   Atypical chest pain  Atypical chest pain: Substernal to epigastric area, associated with worsening with deep breathing, and coughing, tender to palpation over the sternum and epigastric area.  Cardiac enzymes are negative.  Repeat EKG ordered.  It shows NSR.  Echocardiogram from June shows LVEF of 35 to 40%.  Hypokinesis of the lateral and inferior myocardium.  Last cath in 2013.  H/o DES to LAD. Currently on aspirin , plavix , bb, ARB, crestor . Resume the same Cardiology consulted , suggested non cardiac chest pain.  No further recommendations.  Pain only relieved with oxycodone, discharged on 10 tablets of oxy. Recommended outpatient pain clinic referral.    Depression: crying , reports he is very depressed, denies suicidal  ideation. Requesting for help with counseling.  Increased cymbalta to 90 mg daily and added ativan.  Requested psychiatry consult for medication assistance and to see if he is candidate for inpatient psychiatry hospitalization.  Requesting help with medications esp klonopin.   Difficult living situation: requested social worker to help.     Diabetes mellitus; Last hgba1c is 6.4  CBG (last 3)   Recent Labs (last 2 labs)    Recent Labs  11/18/15 0742 11/18/15 1136 11/18/15 1736  GLUCAP 121* 159* 209*      Resume SSI.    Hypertension: controlled.  Resume home meds.    COPD; No wheezing heard.  Resume bronchodilators as needed.     Discharge Instructions  Discharge Instructions    (HEART FAILURE PATIENTS) Call MD:  Anytime you have any of the following symptoms: 1) 3 pound weight gain in 24 hours or 5 pounds in 1 week 2) shortness of breath, with or without a dry hacking cough 3) swelling in the hands, feet or stomach 4) if you have to sleep on extra pillows at night in order to breathe.    Complete by:  As directed   Diet - low sodium heart healthy    Complete by:  As directed   Discharge instructions    Complete by:  As directed   Follow up with PCP in one week.  You will need to follow up with [facility] at [place]. [place]       Medication List    STOP taking these medications   zolpidem 10 MG tablet Commonly known as:  AMBIEN     TAKE these  medications   acetaminophen 325 MG tablet Commonly known as:  TYLENOL Take 2 tablets (650 mg total) by mouth every 4 (four) hours as needed for headache or mild pain.   albuterol (2.5 MG/3ML) 0.083% nebulizer solution Commonly known as:  PROVENTIL Take 3 mLs (2.5 mg total) by nebulization every 6 (six) hours as needed for wheezing or shortness of breath.   Albuterol Sulfate 108 (90 Base) MCG/ACT Aepb Inhale 1-2 puffs into the lungs daily as needed (for shortness of breath/wheezing).   aspirin EC 81 MG  tablet Take 1 tablet (81 mg total) by mouth daily.   budesonide 0.5 MG/2ML nebulizer solution Commonly known as:  PULMICORT Take 0.5 mg by nebulization daily as needed for shortness of breath/wheezing   butalbital-acetaminophen-caffeine 50-325-40 MG tablet Commonly known as:  FIORICET, ESGIC Take 1 tablet by mouth 3 (three) times daily as needed for headache or migraine.   Cinnamon 500 MG capsule Take 1,000 mg by mouth daily.   clonazePAM 2 MG tablet Commonly known as:  KLONOPIN Take 0.5 tablets (1 mg total) by mouth 3 (three) times daily as needed for anxiety. What changed:  how much to take   clopidogrel 75 MG tablet Commonly known as:  PLAVIX Take 1 tablet (75 mg total) by mouth daily.   DULoxetine 60 MG capsule Commonly known as:  CYMBALTA Take 1 capsule (60 mg total) by mouth daily. What changed:  Another medication with the same name was added. Make sure you understand how and when to take each.   DULoxetine 30 MG capsule Commonly known as:  CYMBALTA Take 1 capsule (30 mg total) by mouth at bedtime. What changed:  You were already taking a medication with the same name, and this prescription was added. Make sure you understand how and when to take each.   fluticasone 50 MCG/ACT nasal spray Commonly known as:  FLONASE Place 2 sprays into both nostrils daily as needed for allergies or rhinitis.   glimepiride 2 MG tablet Commonly known as:  AMARYL Take 2 mg by mouth daily with breakfast.   isosorbide mononitrate 60 MG 24 hr tablet Commonly known as:  IMDUR Take 90 mg by mouth daily.   lisinopril 10 MG tablet Commonly known as:  PRINIVIL,ZESTRIL Take 10 mg by mouth daily.   losartan 50 MG tablet Commonly known as:  COZAAR Take 1 tablet by mouth daily.   metFORMIN 1000 MG tablet Commonly known as:  GLUCOPHAGE Take 2,000 mg by mouth daily.   metoprolol succinate 50 MG 24 hr tablet Commonly known as:  TOPROL-XL Take 50 mg by mouth daily.   nitroGLYCERIN 0.4  MG SL tablet Commonly known as:  NITROSTAT Place 0.4 mg under the tongue every 5 (five) minutes as needed for chest pain.   Oxycodone HCl 10 MG Tabs Take 1 tablet (10 mg total) by mouth every 8 (eight) hours as needed for severe pain.   pantoprazole 40 MG tablet Commonly known as:  PROTONIX Take 1 tablet (40 mg total) by mouth daily.   promethazine 25 MG tablet Commonly known as:  PHENERGAN Take 25 mg by mouth every 8 (eight) hours as needed.   rosuvastatin 10 MG tablet Commonly known as:  CRESTOR Take 1 tablet (10 mg total) by mouth daily.   sitaGLIPtin 100 MG tablet Commonly known as:  JANUVIA Take 100 mg by mouth daily.   tamsulosin 0.4 MG Caps capsule Commonly known as:  FLOMAX Take 1 capsule (0.4 mg total) by mouth daily.   tiotropium  18 MCG inhalation capsule Commonly known as:  SPIRIVA Place 18 mcg into inhaler and inhale 2 (two) times a week.   traZODone 50 MG tablet Commonly known as:  DESYREL Take 50 mg by mouth at bedtime.      Follow-up Information    [facility]. Schedule an appointment as soon as possible for a visit in 1 week(s).   Specialty:  Pain Medicine Contact information: [id number] [place] [address] [phone]         Allergies  Allergen Reactions  . Advil [Ibuprofen] Itching  . Mobic [Meloxicam]   . Naproxen Nausea Only  . Tramadol   . Cimetidine Other (See Comments) and Rash    headaches Severe headache    Consultations:  Cardiology  Psychiatry.    Procedures/Studies: Dg Chest Portable 1 View  Result Date: 11/15/2015 CLINICAL DATA:  55 year old male with chest pain radiating to the back for 1 day. Initial encounter. Former smoker. EXAM: PORTABLE CHEST 1 VIEW COMPARISON:  09/15/2015 and earlier. FINDINGS: Portable AP upright view at 2048 hours. Stable lung volumes. Mediastinal contours are stable and within normal limits. Visualized  tracheal air column is within normal limits. Mild increased interstitial markings are stable. No pneumothorax, pleural effusion or acute pulmonary opacity. IMPRESSION: No acute cardiopulmonary abnormality. Electronically Signed   By: Genevie Ann M.D.   On: 11/15/2015 21:04       Subjective: Pain controlled.   Discharge Exam: Vitals:   11/19/15 0553 11/19/15 1113  BP: 106/66 112/72  Pulse: 60 62  Resp: 16   Temp: 97.5 F (36.4 C)    Vitals:   11/18/15 1930 11/18/15 1931 11/19/15 0553 11/19/15 1113  BP: 114/70  106/66 112/72  Pulse: 78 83 60 62  Resp: 16 16 16    Temp: 97.8 F (36.6 C)  97.5 F (36.4 C)   TempSrc: Oral  Oral   SpO2: 98% 93% 99%   Weight:      Height:        General: Pt is alert, awake, not in acute distress Cardiovascular: RRR, S1/S2 +, no rubs, no gallops Respiratory: CTA bilaterally, no wheezing, no rhonchi Abdominal: Soft, NT, ND, bowel sounds + Extremities: no edema, no cyanosis    The results of significant diagnostics from this hospitalization (including imaging, microbiology, ancillary and laboratory) are listed below for reference.     Microbiology: No results found for this or any previous visit (from the past 240 hour(s)).   Labs: BNP (last 3 results) No results for input(s): BNP in the last 8760 hours. Basic Metabolic Panel:  Recent Labs Lab 11/15/15 2101 11/16/15 0924  NA 136 136  K 2.9* 4.5  CL 99* 103  CO2 20* 23  GLUCOSE 106* 183*  BUN 17 15  CREATININE 0.99 0.92  CALCIUM 9.2 8.6*   Liver Function Tests:  Recent Labs Lab 11/15/15 2101  AST 28  ALT 39  ALKPHOS 53  BILITOT 0.5  PROT 7.0  ALBUMIN 3.9   No results for input(s): LIPASE, AMYLASE in the last 168 hours. No results for input(s): AMMONIA in the last 168 hours. CBC:  Recent Labs Lab 11/15/15 2101 11/17/15 0705  WBC 13.0* 7.4  NEUTROABS 9.5*  --   HGB 16.6 16.0  HCT 48.2 47.6  MCV 92.5 93.2  PLT 214 165   Cardiac Enzymes:  Recent Labs Lab  11/15/15 2101 11/16/15 0308 11/16/15 0924  TROPONINI <0.03 0.03* <0.03   BNP: Invalid  input(s): POCBNP CBG:  Recent Labs Lab 11/18/15 1136 11/18/15 1736 11/18/15 2109 11/19/15 0819 11/19/15 1129  GLUCAP 159* 209* 157* 155* 132*   D-Dimer No results for input(s): DDIMER in the last 72 hours. Hgb A1c  Recent Labs  11/17/15 0705  HGBA1C 5.8*   Lipid Profile No results for input(s): CHOL, HDL, LDLCALC, TRIG, CHOLHDL, LDLDIRECT in the last 72 hours. Thyroid function studies No results for input(s): TSH, T4TOTAL, T3FREE, THYROIDAB in the last 72 hours.  Invalid input(s): FREET3 Anemia work up No results for input(s): VITAMINB12, FOLATE, FERRITIN, TIBC, IRON, RETICCTPCT in the last 72 hours. Urinalysis    Component Value Date/Time   COLORURINE YELLOW 03/18/2012 0402   APPEARANCEUR CLEAR 03/18/2012 0402   LABSPEC >1.030 (H) 03/18/2012 0402   PHURINE 5.5 03/18/2012 0402   GLUCOSEU NEGATIVE 03/18/2012 0402   HGBUR NEGATIVE 03/18/2012 0402   BILIRUBINUR NEGATIVE 03/18/2012 0402   KETONESUR NEGATIVE 03/18/2012 0402   PROTEINUR NEGATIVE 03/18/2012 0402   UROBILINOGEN 0.2 03/18/2012 0402   NITRITE NEGATIVE 03/18/2012 0402   LEUKOCYTESUR NEGATIVE 03/18/2012 0402   Sepsis Labs Invalid input(s): PROCALCITONIN,  WBC,  LACTICIDVEN Microbiology No results found for this or any previous visit (from the past 240 hour(s)).   Time coordinating discharge: Over 30 minutes  SIGNED:   Hosie Poisson, MD  [facility] 11/19/2015, 4:48 PM Pager   If 7PM-7AM, please contact night-coverage [email or web address] Password [email or web address]

## 2015-11-19 NOTE — Consult Note (Signed)
Telepsych Consultation   Reason for Consult:  Psych evaluation  Referring Physician:  Dr. Karleen Hampshire  Patient Identification: Derek Chen MRN:  [id number] Principal Diagnosis: Chest pain Diagnosis:   Patient Active Problem List   Diagnosis Date Noted  . Atypical chest pain [R07.89] 11/15/2015  . Hypokalemia [E87.6] 09/15/2015  . Chest pain [R07.9] 09/15/2015  . Insomnia [G47.00] 09/15/2015  . Depression [F32.9] 09/15/2015  . 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 Non-insulin dependent type 2 diabetes mellitus ([place]) [E11.9]   . Erectile dysfunction associated with type 2 diabetes mellitus ([place]) [E11.69, N52.1] 01/21/2014  . COPD, moderate ([place]) [J44.9] 07/15/2013  . Diabetes ([place]) [E11.9] 11/06/2012  . BPH (benign prostatic hyperplasia) [N40.0] 11/06/2012  . 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 Palpitations [R00.2] 03/29/2012  . Dyspnea and respiratory abnormality [R06.00, R06.89] 03/29/2012  . Breath shortness [R06.02] 03/29/2012  . Acute myocardial infarction, subendocardial infarction ([place]) [I21.4] 03/27/2012  . Heart disease [I51.9] 03/27/2012  . 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 Coronary atherosclerosis [I25.10] 03/27/2012  . Acute non-ST segment elevation myocardial infarction ([place]) [I21.4] 03/27/2012  . Left ventricular dysfunction [I51.9] 03/27/2012  . Anxiety [F41.9] 03/27/2012  . Essential hypertension, benign [I10]   . Coronary atherosclerosis of native coronary artery [I25.10]   . Hyperlipidemia with target LDL less than 100 [E78.5] 04/19/2010    Total Time spent with patient: 30 minutes  Subjective:   Derek Chen is a 55 y.o. male patient admitted with chest pain. He did report some increased stressors "old and new". He reports a history of depression and anxiety that is being managed by his PCP doctor Cyd Silence at [facility]. He reports being  compliant with his medication Cymbalta 60mg  that was increased to 90mg  during his current hospital stay. He also reported taking Klonopin 2mg  po TID for anxiety. He denies any suicidal ideation, homicidal ideation, hallucinations or psychosis. He reports being followed by his PCP because he is unable to find a psychiatrist and he has medicare. He also reports a history of bulging disc and has been unable to get medication to treat his pain.   HPI:  HPI: Derek Chen is a 55 y.o. male with medical history significant of MI x3, CAD s/p stent, DM2, HTN, HLD, and COPD who presents with complaints of constant, sharp left-sided chest pain which initially onset Saturday. Pain became more severe today around 4pm. He took two NTG this afternoon with no noted improvement. Current pain is similar to pain from two months ago, though more severe, and is described as 10/10. During past chest pain rule out, patient was transferred to [facility] where it was determined he had chest wall pain. EF at that time was normal at 55%.    ED Course: While in the ED, CMP showed potassium 2.9, but was otherwise unremarkable. CBC normal, troponin negative. Tox screen positive for alcohol. CXR was negative for any acute cardiopulmonary disease. Hospitalist was asked to refer for admission for chest pain rule out.  Past Psychiatric History: Depression and Anxiety  Risk to Self: Is patient at risk for suicide?: No Risk to Others:   Prior Inpatient Therapy:   None Prior Outpatient Therapy:  None   Past Medical History:  Past Medical History:  Diagnosis Date  . Anxiety   . Basal cell cancer    Chest and back - status post skin grafting  . Cardiomyopathy ([place])    LVEF  45-50%  . COPD (chronic obstructive pulmonary disease) ([place])   . Coronary artery disease    NSTEMI - DES mid LAD December 2011 (patent December 2013)  . Dyslipidemia   . Essential hypertension, benign   . Hepatitis B antibody positive 2006  . MI  (myocardial infarction) ([place])   . Type 2 diabetes mellitus ([place]) 2012  . Viral pneumonia, unspecified 2004    Past Surgical History:  Procedure Laterality Date  . APPENDECTOMY    . COLONOSCOPY N/A 08/10/2015   Procedure: COLONOSCOPY;  Surgeon: Rogene Houston, MD;  Location: [facility] ENDO SUITE;  Service: Endoscopy;  Laterality: N/A;  730  . CORONARY STENT PLACEMENT    . FINGER SURGERY    . KNEE ARTHROSCOPY     Left  . LEFT HEART CATHETERIZATION WITH CORONARY ANGIOGRAM N/A 03/20/2012   Procedure: LEFT HEART CATHETERIZATION WITH CORONARY ANGIOGRAM;  Surgeon: Thayer Headings, MD;  Location: [facility] CATH LAB;  Service: Cardiovascular;  Laterality: N/A;  . SKIN GRAFT SPLIT THICKNESS TRUNK    01/23/2010    Right chest;  Felicie Morn, M.D.  . VASECTOMY     Family History:  Family History  Problem Relation Age of Onset  . Diabetes Mother   . Deep vein thrombosis Mother   . Pulmonary embolism Mother   . Cancer Father     hodgins lymphoma  . Diabetes Sister    Family Psychiatric  History: None Social History:  History  Alcohol Use  . 1.0 - 1.5 oz/week  . 2 - 3 Standard drinks or equivalent per week    Comment: Drinks occasional beer and wine     History  Drug Use No    Social History   Social History  . Marital status: Married    Spouse name: N/A  . Number of children: 2  . Years of education: N/A   Occupational History  . [employer]   Social History Main Topics  . Smoking status: Former Smoker    Packs/day: 1.00    Years: 28.00    Types: Cigarettes    Quit date: 05/08/2010  . Smokeless tobacco: Former Systems developer  . Alcohol use 1.0 - 1.5 oz/week    2 - 3 Standard drinks or equivalent per week     Comment: Drinks occasional beer and wine  . Drug use: No  . Sexual activity: Not Asked   Other Topics Concern  . None   Social History Narrative  . None   Additional Social History:    Allergies:   Allergies  Allergen Reactions  . Advil  [Ibuprofen] Itching  . Mobic [Meloxicam]   . Naproxen Nausea Only  . Tramadol   . Cimetidine Other (See Comments) and Rash    headaches Severe headache    Labs:  Results for orders placed or performed during the hospital encounter of 11/15/15 (from the past 48 hour(s))  Glucose, capillary     Status: None   Collection Time: 11/17/15  4:38 PM  Result Value Ref Range   Glucose-Capillary 99 65 - 99 mg/dL  Glucose, capillary     Status: Abnormal   Collection Time: 11/17/15  8:40 PM  Result Value Ref Range   Glucose-Capillary 126 (H) 65 - 99 mg/dL   Comment 1 Notify RN    Comment 2 Document in Chart   Glucose, capillary     Status: Abnormal   Collection Time: 11/18/15  7:42 AM  Result Value Ref Range  Glucose-Capillary 121 (H) 65 - 99 mg/dL  Glucose, capillary     Status: Abnormal   Collection Time: 11/18/15 11:36 AM  Result Value Ref Range   Glucose-Capillary 159 (H) 65 - 99 mg/dL  Glucose, capillary     Status: Abnormal   Collection Time: 11/18/15  5:36 PM  Result Value Ref Range   Glucose-Capillary 209 (H) 65 - 99 mg/dL  Glucose, capillary     Status: Abnormal   Collection Time: 11/18/15  9:09 PM  Result Value Ref Range   Glucose-Capillary 157 (H) 65 - 99 mg/dL   Comment 1 Notify RN    Comment 2 Document in Chart   Glucose, capillary     Status: Abnormal   Collection Time: 11/19/15  8:19 AM  Result Value Ref Range   Glucose-Capillary 155 (H) 65 - 99 mg/dL  Glucose, capillary     Status: Abnormal   Collection Time: 11/19/15 11:29 AM  Result Value Ref Range   Glucose-Capillary 132 (H) 65 - 99 mg/dL    Current Facility-Administered Medications  Medication Dose Route Frequency Provider Last Rate Last Dose  . acetaminophen (TYLENOL) tablet 650 mg  650 mg Oral Q4H PRN Orvan Falconer, MD      . albuterol (PROVENTIL) (2.5 MG/3ML) 0.083% nebulizer solution 2.5 mg  2.5 mg Nebulization Q6H PRN Orvan Falconer, MD      . albuterol (PROVENTIL) (2.5 MG/3ML) 0.083% nebulizer solution 2.5 mg   2.5 mg Inhalation Daily PRN Hosie Poisson, MD      . aspirin EC tablet 81 mg  81 mg Oral Daily Orvan Falconer, MD   81 mg at 11/19/15 1112  . budesonide (PULMICORT) nebulizer solution 0.25 mg  0.25 mg Inhalation BID Orvan Falconer, MD   0.25 mg at 11/17/15 2133  . clonazePAM (KLONOPIN) tablet 1 mg  1 mg Oral TID PRN Orvan Falconer, MD   1 mg at 11/19/15 0650  . clopidogrel (PLAVIX) tablet 75 mg  75 mg Oral Daily Orvan Falconer, MD   75 mg at 11/19/15 1112  . DULoxetine (CYMBALTA) DR capsule 30 mg  30 mg Oral QHS Hosie Poisson, MD   30 mg at 11/18/15 2116  . DULoxetine (CYMBALTA) DR capsule 60 mg  60 mg Oral Daily Orvan Falconer, MD   60 mg at 11/19/15 1112  . fluticasone (FLONASE) 50 MCG/ACT nasal spray 2 spray  2 spray Each Nare Daily PRN Orvan Falconer, MD      . heparin injection 5,000 Units  5,000 Units Subcutaneous Q8H Orvan Falconer, MD   5,000 Units at 11/19/15 [phone]  . insulin aspart (novoLOG) injection 0-5 Units  0-5 Units Subcutaneous QHS Hosie Poisson, MD      . insulin aspart (novoLOG) injection 0-9 Units  0-9 Units Subcutaneous TID WC Hosie Poisson, MD   1 Units at 11/19/15 1146  . losartan (COZAAR) tablet 50 mg  50 mg Oral Daily Orvan Falconer, MD   50 mg at 11/19/15 1112  . metoprolol tartrate (LOPRESSOR) tablet 25 mg  25 mg Oral BID Orvan Falconer, MD   25 mg at 11/19/15 1113  . morphine 2 MG/ML injection 2 mg  2 mg Intravenous Q2H PRN Orvan Falconer, MD   2 mg at 11/18/15 1732  . oxyCODONE (Oxy IR/ROXICODONE) immediate release tablet 10 mg  10 mg Oral Q4H PRN Hosie Poisson, MD   10 mg at 11/19/15 1110  . pantoprazole (PROTONIX) EC tablet 40 mg  40 mg Oral BID Hosie Poisson, MD   40 mg  at 11/19/15 1112  . potassium chloride SA (K-DUR,KLOR-CON) CR tablet 40 mEq  40 mEq Oral Daily Orvan Falconer, MD   40 mEq at 11/19/15 1111  . promethazine (PHENERGAN) tablet 12.5 mg  12.5 mg Oral Q6H PRN Hosie Poisson, MD   12.5 mg at 11/19/15 1112  . rosuvastatin (CRESTOR) tablet 10 mg  10 mg Oral Daily Orvan Falconer, MD   10 mg at 11/19/15 1112  . tamsulosin (FLOMAX) capsule  0.4 mg  0.4 mg Oral Daily Orvan Falconer, MD   0.4 mg at 11/19/15 1111  . tiotropium (SPIRIVA) inhalation capsule 18 mcg  18 mcg Inhalation Once per day on Mon Thu Hosie Poisson, MD   18 mcg at 11/16/15 1016  . traZODone (DESYREL) tablet 50 mg  50 mg Oral QHS Orvan Falconer, MD   50 mg at 11/18/15 2115  . zolpidem (AMBIEN) tablet 10 mg  10 mg Oral QHS PRN Orvan Falconer, MD        Musculoskeletal: Strength & Muscle Tone: within normal limits Gait & Station: normal Patient leans: N/A  Psychiatric Specialty Exam: Physical Exam  ROS  Blood pressure 112/72, pulse 62, temperature 97.5 F (36.4 C), temperature source Oral, resp. rate 16, height 5\' 11"  (1.803 m), weight 71.3 kg (157 lb 3.7 oz), SpO2 99 %.Body mass index is 21.93 kg/m.  General Appearance: Fairly Groomed  Eye Contact:  Good  Speech:  Clear and Coherent and Normal Rate  Volume:  Normal  Mood:  Euthymic  Affect:  Appropriate and Congruent  Thought Process:  Coherent and Goal Directed  Orientation:  Full (Time, Place, and Person)  Thought Content:  WDL  Suicidal Thoughts:  No  Homicidal Thoughts:  No  Memory:  Immediate;   Fair Recent;   Fair  Judgement:  Fair  Insight:  Fair  Psychomotor Activity:  Normal  Concentration:  Concentration: Fair  Recall:  [employer] of Knowledge:  Fair  Language:  Fair  Akathisia:  No  Handed:  Right  AIMS (if indicated):     Assets:  Communication Skills Desire for Improvement Financial Resources/Insurance Leisure Time Physical Health Social Support Talents/Skills Vocational/Educational  ADL's:  Intact  Cognition:  WNL  Sleep:      Assessment: Derek Chen is a 55 year old male that initially presented to [facility] for chest pain. He was admitted to the medical floor for further management of the chest pain, the Hospitalist wanted to have him assessed for depression and suicide. Upon evaluation he does report increased stressors, however he is compliant with medication and denies SI/HI/AVH, and does not  appear to be responding to internal stimuli. He compliant with his medication and being seen by PCP, he does not see a psychiatrist but is willing to see one for further management. He his chronic pain does add to worsening depression, and Dr. Karleen Hampshire is placing a referral to pain management. He does admit he doesn't  Want to go to see a physician in [place] but willing to travel to get better care.   Treatment Plan Summary: Daily contact with patient to assess and evaluate symptoms and progress in treatment and Medication management  Disposition: No evidence of imminent risk to self or others at present.   Patient does not meet criteria for psychiatric inpatient admission. Supportive therapy provided about ongoing stressors. Discussed crisis plan, support from social network, calling 911, coming to the Emergency Department, and calling Suicide Hotline. Will place referral to [place].   Nanci Pina,  FNP 11/19/2015 4:19 PM

## 2015-12-03 ENCOUNTER — Emergency Department (HOSPITAL_COMMUNITY)
Admission: EM | Admit: 2015-12-03 | Discharge: 2015-12-03 | Disposition: A | Discharge status: Home / Self Care | Payer: Medicare Other | Attending: Emergency Medicine | Admitting: Emergency Medicine

## 2015-12-03 ENCOUNTER — Encounter (HOSPITAL_COMMUNITY): Payer: Self-pay | Admitting: Emergency Medicine

## 2015-12-03 ENCOUNTER — Emergency Department (HOSPITAL_COMMUNITY): Payer: Medicare Other

## 2015-12-03 DIAGNOSIS — I10 Essential (primary) hypertension: Secondary | ICD-10-CM | POA: Diagnosis not present

## 2015-12-03 DIAGNOSIS — Z79891 Long term (current) use of opiate analgesic: Secondary | ICD-10-CM | POA: Diagnosis not present

## 2015-12-03 DIAGNOSIS — Z79899 Other long term (current) drug therapy: Secondary | ICD-10-CM | POA: Insufficient documentation

## 2015-12-03 DIAGNOSIS — Z87891 Personal history of nicotine dependence: Secondary | ICD-10-CM | POA: Insufficient documentation

## 2015-12-03 DIAGNOSIS — E119 Type 2 diabetes mellitus without complications: Secondary | ICD-10-CM | POA: Diagnosis not present

## 2015-12-03 DIAGNOSIS — Z7982 Long term (current) use of aspirin: Secondary | ICD-10-CM | POA: Insufficient documentation

## 2015-12-03 DIAGNOSIS — R0789 Other chest pain: Secondary | ICD-10-CM | POA: Insufficient documentation

## 2015-12-03 DIAGNOSIS — I2510 Atherosclerotic heart disease of native coronary artery without angina pectoris: Secondary | ICD-10-CM | POA: Diagnosis not present

## 2015-12-03 DIAGNOSIS — Z7984 Long term (current) use of oral hypoglycemic drugs: Secondary | ICD-10-CM | POA: Insufficient documentation

## 2015-12-03 DIAGNOSIS — J449 Chronic obstructive pulmonary disease, unspecified: Secondary | ICD-10-CM | POA: Insufficient documentation

## 2015-12-03 DIAGNOSIS — Z760 Encounter for issue of repeat prescription: Secondary | ICD-10-CM | POA: Diagnosis present

## 2015-12-03 LAB — COMPREHENSIVE METABOLIC PANEL
ALT: 39 U/L (ref 17–63)
ANION GAP: 10 (ref 5–15)
AST: 23 U/L (ref 15–41)
Albumin: 3.9 g/dL (ref 3.5–5.0)
Alkaline Phosphatase: 59 U/L (ref 38–126)
BUN: 12 mg/dL (ref 6–20)
CHLORIDE: 102 mmol/L (ref 101–111)
CO2: 22 mmol/L (ref 22–32)
CREATININE: 0.76 mg/dL (ref 0.61–1.24)
Calcium: 8.7 mg/dL — ABNORMAL LOW (ref 8.9–10.3)
Glucose, Bld: 220 mg/dL — ABNORMAL HIGH (ref 65–99)
Potassium: 4 mmol/L (ref 3.5–5.1)
Sodium: 134 mmol/L — ABNORMAL LOW (ref 135–145)
Total Bilirubin: 0.6 mg/dL (ref 0.3–1.2)
Total Protein: 6.7 g/dL (ref 6.5–8.1)

## 2015-12-03 LAB — CBC WITH DIFFERENTIAL/PLATELET
Basophils Absolute: 0 10*3/uL (ref 0.0–0.1)
Basophils Relative: 0 %
EOS ABS: 0.1 10*3/uL (ref 0.0–0.7)
EOS PCT: 1 %
HCT: 46.5 % (ref 39.0–52.0)
Hemoglobin: 16.2 g/dL (ref 13.0–17.0)
LYMPHS ABS: 2 10*3/uL (ref 0.7–4.0)
LYMPHS PCT: 19 %
MCH: 32.1 pg (ref 26.0–34.0)
MCHC: 34.8 g/dL (ref 30.0–36.0)
MCV: 92.1 fL (ref 78.0–100.0)
MONO ABS: 0.7 10*3/uL (ref 0.1–1.0)
MONOS PCT: 6 %
Neutro Abs: 7.8 10*3/uL — ABNORMAL HIGH (ref 1.7–7.7)
Neutrophils Relative %: 74 %
PLATELETS: 221 10*3/uL (ref 150–400)
RBC: 5.05 MIL/uL (ref 4.22–5.81)
RDW: 13.7 % (ref 11.5–15.5)
WBC: 10.6 10*3/uL — ABNORMAL HIGH (ref 4.0–10.5)

## 2015-12-03 LAB — TROPONIN I

## 2015-12-03 MED ORDER — OXYCODONE HCL 5 MG PO TABS
5.0000 mg | ORAL_TABLET | Freq: Once | ORAL | Status: AC
  Administered 2015-12-03: 5 mg via ORAL
  Filled 2015-12-03: qty 1

## 2015-12-03 NOTE — ED Triage Notes (Signed)
Pt reports being admitted in early August for chest wall pain and was given referral to pain management, pt was given oxycodone prescription and has ran out of medication.  Pt requesting refill .

## 2015-12-03 NOTE — ED Provider Notes (Signed)
[facility] DEPT Provider Note   CSN: [id number] Arrival date & time: 12/03/15  1417     History   Chief Complaint Chief Complaint  Patient presents with  . Medication Refill    HPI Derek Chen is a 55 y.o. male.  Patient reports ongoing "chest wall pain" since his hospital discharge on August 20. He is requesting a refill of his oxycodone. He is referred to pain management was not able to see them yet. Reports the pain is in the left side of his chest that is sharp and constant. It does not radiate. It is worse with palpation and movement. Denies any shortness of breath, nausea, vomiting, diaphoresis. Pain is not exertional or pleuritic. Patient does have history of CAD with 1 stent last catheterization in 2013. States he's had an MI 3. Reports this pain is different than his cardiac pain. He reports allergies to NSAIDs. He denies any abdominal pain, nausea vomiting. No fever. No focal weakness, numbness or tingling.   The history is provided by the patient.  Medication Refill    Past Medical History:  Diagnosis Date  . Anxiety   . Basal cell cancer    Chest and back - status post skin grafting  . Cardiomyopathy (HCC)    LVEF 45-50%  . COPD (chronic obstructive pulmonary disease) ([place])   . Coronary artery disease    NSTEMI - DES mid LAD December 2011 (patent December 2013)  . Dyslipidemia   . Essential hypertension, benign   . Hepatitis B antibody positive 2006  . MI (myocardial infarction) ([place])   . Type 2 diabetes mellitus ([place]) 2012  . Viral pneumonia, unspecified 2004    Patient Active Problem List   Diagnosis Date Noted  . Atypical chest pain 11/15/2015  . Hypokalemia 09/15/2015  . Chest pain 09/15/2015  . Insomnia 09/15/2015  . Depression 09/15/2015  . 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 Non-insulin dependent type 2 diabetes mellitus ([place])   . Erectile dysfunction associated with type 2 diabetes mellitus  ([place]) 01/21/2014  . COPD, moderate ([place]) 07/15/2013  . Diabetes ([place]) 11/06/2012  . BPH (benign prostatic hyperplasia) 11/06/2012  . 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 Palpitations 03/29/2012  . Dyspnea and respiratory abnormality 03/29/2012  . Breath shortness 03/29/2012  . Acute myocardial infarction, subendocardial infarction ([place]) 03/27/2012  . Heart disease 03/27/2012  . 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 Coronary atherosclerosis 03/27/2012  . Acute non-ST segment elevation myocardial infarction ([place]) 03/27/2012  . Left ventricular dysfunction 03/27/2012  . Anxiety 03/27/2012  . Essential hypertension, benign   . Coronary atherosclerosis of native coronary artery   . Hyperlipidemia with target LDL less than 100 04/19/2010    Past Surgical History:  Procedure Laterality Date  . APPENDECTOMY    . COLONOSCOPY N/A 08/10/2015   Procedure: COLONOSCOPY;  Surgeon: Rogene Houston, MD;  Location: [facility];  Service: Endoscopy;  Laterality: N/A;  730  . CORONARY STENT PLACEMENT    . FINGER SURGERY    . KNEE ARTHROSCOPY     Left  . LEFT HEART CATHETERIZATION WITH CORONARY ANGIOGRAM N/A 03/20/2012   Procedure: LEFT HEART CATHETERIZATION WITH CORONARY ANGIOGRAM;  Surgeon: Thayer Headings, MD;  Location: [facility] CATH LAB;  Service: Cardiovascular;  Laterality: N/A;  . SKIN GRAFT SPLIT THICKNESS TRUNK    01/23/2010    Right chest;  Felicie Morn, M.D.  . VASECTOMY         Home  Medications    Prior to Admission medications   Medication Sig Start Date End Date Taking? Authorizing Provider  acetaminophen (TYLENOL) 325 MG tablet Take 2 tablets (650 mg total) by mouth every 4 (four) hours as needed for headache or mild pain. 09/16/15  Yes Albertine Patricia, MD  albuterol (PROVENTIL) (2.5 MG/3ML) 0.083% nebulizer solution Take 3 mLs (2.5 mg total) by nebulization every 6 (six) hours as needed for wheezing or shortness of breath. 11/10/13  Yes Mary-Margaret  Hassell Done, FNP  Albuterol Sulfate 108 (90 Base) MCG/ACT AEPB Inhale 1-2 puffs into the lungs daily as needed (for shortness of breath/wheezing).  10/04/15  Yes Historical Provider, MD  aspirin EC 81 MG tablet Take 1 tablet (81 mg total) by mouth daily. 08/15/15  Yes Rogene Houston, MD  budesonide (PULMICORT) 0.5 MG/2ML nebulizer solution Take 0.5 mg by nebulization daily as needed for shortness of breath/wheezing   Yes Historical Provider, MD  butalbital-acetaminophen-caffeine (FIORICET, ESGIC) 50-325-40 MG tablet Take 1 tablet by mouth 3 (three) times daily as needed for headache or migraine. 11/19/15  Yes Hosie Poisson, MD  Cinnamon 500 MG capsule Take 1,000 mg by mouth daily.   Yes Historical Provider, MD  clonazePAM (KLONOPIN) 2 MG tablet Take 0.5 tablets (1 mg total) by mouth 3 (three) times daily as needed for anxiety. 11/19/15  Yes Hosie Poisson, MD  clopidogrel (PLAVIX) 75 MG tablet Take 1 tablet (75 mg total) by mouth daily. 08/15/15  Yes Rogene Houston, MD  DULoxetine (CYMBALTA) 30 MG capsule Take 1 capsule (30 mg total) by mouth at bedtime. 11/19/15  Yes Hosie Poisson, MD  DULoxetine (CYMBALTA) 60 MG capsule Take 1 capsule (60 mg total) by mouth daily. 03/17/14  Yes Mary-Margaret Hassell Done, FNP  fluticasone (FLONASE) 50 MCG/ACT nasal spray Place 2 sprays into both nostrils daily as needed for allergies or rhinitis.  08/09/13  Yes Mary-Margaret Hassell Done, FNP  glimepiride (AMARYL) 2 MG tablet Take 2 mg by mouth daily with breakfast.  02/15/13  Yes Historical Provider, MD  isosorbide mononitrate (IMDUR) 60 MG 24 hr tablet Take 90 mg by mouth daily. 09/06/15  Yes Historical Provider, MD  lisinopril (PRINIVIL,ZESTRIL) 10 MG tablet Take 10 mg by mouth daily.  09/18/15  Yes Historical Provider, MD  losartan (COZAAR) 50 MG tablet Take 1 tablet by mouth daily. 01/31/15 01/31/16 Yes Historical Provider, MD  metFORMIN (GLUCOPHAGE) 1000 MG tablet Take 2,000 mg by mouth daily. 03/22/12  Yes Historical Provider, MD    metoprolol succinate (TOPROL-XL) 50 MG 24 hr tablet Take 50 mg by mouth daily. 10/10/15  Yes Historical Provider, MD  nitroGLYCERIN (NITROSTAT) 0.4 MG SL tablet Place 0.4 mg under the tongue every 5 (five) minutes as needed for chest pain.   Yes Historical Provider, MD  oxyCODONE 10 MG TABS Take 1 tablet (10 mg total) by mouth every 8 (eight) hours as needed for severe pain. 11/19/15  Yes Hosie Poisson, MD  pantoprazole (PROTONIX) 40 MG tablet Take 1 tablet (40 mg total) by mouth daily. 11/10/13  Yes Mary-Margaret Hassell Done, FNP  promethazine (PHENERGAN) 25 MG tablet Take 25 mg by mouth every 8 (eight) hours as needed for nausea or vomiting.  11/14/15  Yes Historical Provider, MD  rosuvastatin (CRESTOR) 10 MG tablet Take 1 tablet (10 mg total) by mouth daily. 11/10/13  Yes Mary-Margaret Hassell Done, FNP  sitaGLIPtin (JANUVIA) 100 MG tablet Take 100 mg by mouth daily. 10/23/15  Yes Historical Provider, MD  tamsulosin (FLOMAX) 0.4 MG CAPS capsule Take 1 capsule (  0.4 mg total) by mouth daily. 03/17/14  Yes Mary-Margaret Hassell Done, FNP  tiotropium (SPIRIVA) 18 MCG inhalation capsule Place 18 mcg into inhaler and inhale 2 (two) times a week.  04/20/12  Yes Historical Provider, MD  traZODone (DESYREL) 50 MG tablet Take 50 mg by mouth at bedtime.   Yes Historical Provider, MD  zolpidem (AMBIEN) 10 MG tablet Take 10 mg by mouth at bedtime as needed for sleep. for sleep 11/28/15  Yes Historical Provider, MD    Family History Family History  Problem Relation Age of Onset  . Diabetes Mother   . Deep vein thrombosis Mother   . Pulmonary embolism Mother   . Cancer Father     hodgins lymphoma  . Diabetes Sister     Social History Social History  Substance Use Topics  . Smoking status: Former Smoker    Packs/day: 1.00    Years: 28.00    Types: Cigarettes    Quit date: 05/08/2010  . Smokeless tobacco: Former Systems developer  . Alcohol use 1.0 - 1.5 oz/week    2 - 3 Standard drinks or equivalent per week     Comment: Drinks  occasional beer and wine     Allergies   Advil [ibuprofen]; Mobic [meloxicam]; Naproxen; Tramadol; Vicodin [hydrocodone-acetaminophen]; and Cimetidine   Review of Systems Review of Systems  Constitutional: Negative for activity change, appetite change and fever.  HENT: Negative for congestion and rhinorrhea.   Eyes: Negative for visual disturbance.  Respiratory: Positive for chest tightness. Negative for cough and shortness of breath.   Cardiovascular: Positive for chest pain.  Gastrointestinal: Negative for abdominal pain, nausea and vomiting.  Genitourinary: Negative for dysuria, hematuria and testicular pain.  Musculoskeletal: Negative for arthralgias and myalgias.  Skin: Negative for rash.  Neurological: Negative for dizziness, light-headedness and headaches.   A complete 10 system review of systems was obtained and all systems are negative except as noted in the HPI and PMH.    Physical Exam Updated Vital Signs Pulse 87   Temp 98.1 F (36.7 C) (Oral)   Resp 18   Ht 5\' 11"  (1.803 m)   Wt 154 lb (69.9 kg)   SpO2 100%   BMI 21.48 kg/m   Physical Exam  Constitutional: He is oriented to person, place, and time. He appears well-developed and well-nourished. No distress.  HENT:  Head: Normocephalic and atraumatic.  Mouth/Throat: Oropharynx is clear and moist. No oropharyngeal exudate.  Eyes: Conjunctivae and EOM are normal. Pupils are equal, round, and reactive to light.  Neck: Normal range of motion. Neck supple.  No meningismus.  Cardiovascular: Normal rate, regular rhythm, normal heart sounds and intact distal pulses.   No murmur heard. Pulmonary/Chest: Effort normal and breath sounds normal. No respiratory distress. He exhibits tenderness.  Reproducible L sided chest pain  Abdominal: Soft. There is no tenderness. There is no rebound and no guarding.  Musculoskeletal: Normal range of motion. He exhibits no edema or tenderness.  Neurological: He is alert and oriented  to person, place, and time. No cranial nerve deficit. He exhibits normal muscle tone. Coordination normal.  No ataxia on finger to nose bilaterally. No pronator drift. 5/5 strength throughout. CN 2-12 intact.Equal grip strength. Sensation intact.   Skin: Skin is warm.  Psychiatric: He has a normal mood and affect. His behavior is normal.  Nursing note and vitals reviewed.    ED Treatments / Results  Labs (all labs ordered are listed, but only abnormal results are displayed) Labs Reviewed  CBC  WITH DIFFERENTIAL/PLATELET - Abnormal; Notable for the following:       Result Value   WBC 10.6 (*)    Neutro Abs 7.8 (*)    All other components within normal limits  COMPREHENSIVE METABOLIC PANEL - Abnormal; Notable for the following:    Sodium 134 (*)    Glucose, Bld 220 (*)    Calcium 8.7 (*)    All other components within normal limits  TROPONIN I    EKG  EKG Interpretation  Date/Time:  Sunday December 03 2015 15:24:07 EDT Ventricular Rate:  81 PR Interval:    QRS Duration: 130 QT Interval:  397 QTC Calculation: 461 R Axis:   -124 Text Interpretation:  Sinus rhythm Right bundle branch block Probable lateral infarct, old No significant change was found Confirmed by Wyvonnia Dusky  MD, Mindi Akerson [phone]) on 12/03/2015 3:33:57 PM       Radiology No results found.  Procedures Procedures (including critical care time)  Medications Ordered in ED Medications - No data to display   Initial Impression / Assessment and Plan / ED Course  I have reviewed the triage vital signs and the nursing notes.  Pertinent labs & imaging results that were available during my care of the patient were reviewed by me and considered in my medical decision making (see chart for details).  Clinical Course  Patient reports ongoing chest pain since discharge August 20 that is constant and never goes away. Requests refill of oxycodone. Was referred to pain management clinic.  History of CAD with 1 stent.  Records reviewed. Patient was seen by cardiology during previous admission and thought to have noncardiac chest pain. Last catheterization in 2013 showed patent stent. 08/2015 nuclear stress test [facility]:   Troponin negative. Chest x-ray negative. With ongoing pain for the past 2 weeks, ACS seems unlikely with negative troponin today. Low suspicion for aortic dissection or PE. Pain is reproducible. Patient informed ED cannot provide further narcotics. He states he is allergic to all NSAIDs. He was offered Voltaren gel which he says his insurance will not pay for. Discussed need to follow up with his PCP and pain clinic. Return precautions discussed.   Final Clinical Impressions(s) / ED Diagnoses   Final diagnoses:  Chest wall pain    New Prescriptions New Prescriptions   No medications on file     Ezequiel Essex, MD 12/03/15 1818

## 2015-12-03 NOTE — Discharge Instructions (Signed)
There is no evidence of heart attack. Follow up with your doctor and the pain clinic. Return to the ED if you develop new or worsening symptoms.

## 2016-05-29 ENCOUNTER — Other Ambulatory Visit (HOSPITAL_COMMUNITY): Payer: Self-pay | Admitting: Internal Medicine

## 2016-05-29 DIAGNOSIS — M5412 Radiculopathy, cervical region: Secondary | ICD-10-CM

## 2016-06-25 ENCOUNTER — Ambulatory Visit (HOSPITAL_COMMUNITY)
Admission: RE | Admit: 2016-06-25 | Discharge: 2016-06-25 | Disposition: A | Discharge status: Home / Self Care | Payer: Medicare Other | Source: Ambulatory Visit | Attending: Pain Medicine | Admitting: Pain Medicine

## 2016-06-25 ENCOUNTER — Ambulatory Visit (HOSPITAL_COMMUNITY)
Admission: RE | Admit: 2016-06-25 | Discharge: 2016-06-25 | Disposition: A | Discharge status: Home / Self Care | Payer: Medicare Other | Source: Ambulatory Visit | Attending: Internal Medicine | Admitting: Internal Medicine

## 2016-06-25 ENCOUNTER — Other Ambulatory Visit (HOSPITAL_COMMUNITY): Payer: Self-pay | Admitting: Pain Medicine

## 2016-06-25 ENCOUNTER — Encounter (HOSPITAL_COMMUNITY): Payer: Self-pay

## 2016-06-25 DIAGNOSIS — R52 Pain, unspecified: Secondary | ICD-10-CM

## 2016-06-25 DIAGNOSIS — R51 Headache: Secondary | ICD-10-CM | POA: Diagnosis not present

## 2016-06-25 DIAGNOSIS — M50322 Other cervical disc degeneration at C5-C6 level: Secondary | ICD-10-CM | POA: Diagnosis not present

## 2016-06-25 DIAGNOSIS — M5412 Radiculopathy, cervical region: Secondary | ICD-10-CM

## 2016-07-03 ENCOUNTER — Other Ambulatory Visit (HOSPITAL_COMMUNITY): Payer: Self-pay | Admitting: Pain Medicine

## 2016-07-03 DIAGNOSIS — R51 Headache: Secondary | ICD-10-CM

## 2016-07-03 DIAGNOSIS — R519 Headache, unspecified: Secondary | ICD-10-CM

## 2016-07-03 DIAGNOSIS — M5030 Other cervical disc degeneration, unspecified cervical region: Secondary | ICD-10-CM

## 2016-07-03 DIAGNOSIS — M5412 Radiculopathy, cervical region: Secondary | ICD-10-CM

## 2016-07-08 ENCOUNTER — Encounter (INDEPENDENT_AMBULATORY_CARE_PROVIDER_SITE_OTHER): Payer: Self-pay

## 2016-07-08 ENCOUNTER — Encounter (INDEPENDENT_AMBULATORY_CARE_PROVIDER_SITE_OTHER): Payer: Self-pay | Admitting: Internal Medicine

## 2016-07-08 ENCOUNTER — Encounter (HOSPITAL_COMMUNITY): Payer: Self-pay

## 2016-07-08 ENCOUNTER — Ambulatory Visit (HOSPITAL_COMMUNITY): Payer: Medicare Other

## 2016-07-15 ENCOUNTER — Encounter (INDEPENDENT_AMBULATORY_CARE_PROVIDER_SITE_OTHER): Payer: Self-pay | Admitting: Internal Medicine

## 2016-07-15 ENCOUNTER — Ambulatory Visit (INDEPENDENT_AMBULATORY_CARE_PROVIDER_SITE_OTHER): Payer: Medicare Other | Admitting: Internal Medicine

## 2016-07-17 ENCOUNTER — Ambulatory Visit (HOSPITAL_COMMUNITY)
Admission: RE | Admit: 2016-07-17 | Discharge: 2016-07-17 | Disposition: A | Discharge status: Home / Self Care | Payer: Medicare Other | Source: Ambulatory Visit | Attending: Pain Medicine | Admitting: Pain Medicine

## 2016-07-17 DIAGNOSIS — R51 Headache: Secondary | ICD-10-CM | POA: Insufficient documentation

## 2016-07-17 DIAGNOSIS — M2578 Osteophyte, vertebrae: Secondary | ICD-10-CM | POA: Diagnosis not present

## 2016-07-17 DIAGNOSIS — M4802 Spinal stenosis, cervical region: Secondary | ICD-10-CM | POA: Insufficient documentation

## 2016-07-17 DIAGNOSIS — M5412 Radiculopathy, cervical region: Secondary | ICD-10-CM | POA: Diagnosis not present

## 2016-07-17 DIAGNOSIS — M5030 Other cervical disc degeneration, unspecified cervical region: Secondary | ICD-10-CM | POA: Insufficient documentation

## 2016-07-17 DIAGNOSIS — R519 Headache, unspecified: Secondary | ICD-10-CM

## 2016-08-15 ENCOUNTER — Encounter: Payer: Self-pay | Admitting: General Surgery

## 2016-08-15 ENCOUNTER — Ambulatory Visit (INDEPENDENT_AMBULATORY_CARE_PROVIDER_SITE_OTHER): Payer: Medicare Other | Admitting: General Surgery

## 2016-08-15 VITALS — BP 148/94 | HR 107 | Temp 99.5°F | Resp 18 | Ht 71.0 in | Wt 160.0 lb

## 2016-08-15 DIAGNOSIS — C44519 Basal cell carcinoma of skin of other part of trunk: Secondary | ICD-10-CM

## 2016-08-15 NOTE — Progress Notes (Signed)
Derek Chen; [id number]; [birth year]   HPI Patient is a 56 year old white male with history of basal cell carcinoma on the chest and back who presents with a recurrent basal cell carcinoma in the right upper back. This is biopsy-proven and the patient has been referred to my care for formal excision by Dr. Nevada Crane of dermatology. Patient has 9 out of 10 pain secondary to multiple issues. He does have tenderness at the biopsy site on his back. No fever, chills, drainage have been noted. Past Medical History:  Diagnosis Date  . Anxiety   . Basal cell cancer    Chest and back - status post skin grafting  . Cardiomyopathy (HCC)    LVEF 45-50%  . COPD (chronic obstructive pulmonary disease) ([place])   . Coronary artery disease    NSTEMI - DES mid LAD December 2011 (patent December 2013)  . Dyslipidemia   . Essential hypertension, benign   . Hepatitis B antibody positive 2006  . MI (myocardial infarction) ([place])   . Type 2 diabetes mellitus ([place]) 2012  . Viral pneumonia, unspecified 2004    Past Surgical History:  Procedure Laterality Date  . APPENDECTOMY    . COLONOSCOPY N/A 08/10/2015   Procedure: COLONOSCOPY;  Surgeon: Rogene Houston, MD;  Location: [facility];  Service: Endoscopy;  Laterality: N/A;  730  . CORONARY STENT PLACEMENT    . FINGER SURGERY    . KNEE ARTHROSCOPY     Left  . LEFT HEART CATHETERIZATION WITH CORONARY ANGIOGRAM N/A 03/20/2012   Procedure: LEFT HEART CATHETERIZATION WITH CORONARY ANGIOGRAM;  Surgeon: Thayer Headings, MD;  Location: [facility] CATH LAB;  Service: Cardiovascular;  Laterality: N/A;  . SKIN GRAFT SPLIT THICKNESS TRUNK    01/23/2010    Right chest;  Felicie Morn, M.D.  . VASECTOMY      Family History  Problem Relation Age of Onset  . Diabetes Mother   . Deep vein thrombosis Mother   . Pulmonary embolism Mother   . Cancer Father        hodgins lymphoma  . Diabetes Sister     Current Outpatient Prescriptions on File Prior to Visit  Medication Sig  Dispense Refill  . acetaminophen (TYLENOL) 325 MG tablet Take 2 tablets (650 mg total) by mouth every 4 (four) hours as needed for headache or mild pain.    Marland Kitchen albuterol (PROVENTIL) (2.5 MG/3ML) 0.083% nebulizer solution Take 3 mLs (2.5 mg total) by nebulization every 6 (six) hours as needed for wheezing or shortness of breath. 75 mL 3  . Albuterol Sulfate 108 (90 Base) MCG/ACT AEPB Inhale 1-2 puffs into the lungs daily as needed (for shortness of breath/wheezing).     Marland Kitchen aspirin EC 81 MG tablet Take 1 tablet (81 mg total) by mouth daily.    . budesonide (PULMICORT) 0.5 MG/2ML nebulizer solution Take 0.5 mg by nebulization daily as needed for shortness of breath/wheezing    . butalbital-acetaminophen-caffeine (FIORICET, ESGIC) 50-325-40 MG tablet Take 1 tablet by mouth 3 (three) times daily as needed for headache or migraine. 14 tablet 0  . Cinnamon 500 MG capsule Take 1,000 mg by mouth daily.    . clonazePAM (KLONOPIN) 2 MG tablet Take 0.5 tablets (1 mg total) by mouth 3 (three) times daily as needed for anxiety. 10 tablet 0  . clopidogrel (PLAVIX) 75 MG tablet Take 1 tablet (75 mg total) by mouth daily. 90 tablet 0  . DULoxetine (CYMBALTA) 30 MG capsule Take 1 capsule (30 mg total)  by mouth at bedtime. 30 capsule 0  . DULoxetine (CYMBALTA) 60 MG capsule Take 1 capsule (60 mg total) by mouth daily. 90 capsule 1  . fluticasone (FLONASE) 50 MCG/ACT nasal spray Place 2 sprays into both nostrils daily as needed for allergies or rhinitis.     Marland Kitchen glimepiride (AMARYL) 2 MG tablet Take 2 mg by mouth daily with breakfast.     . isosorbide mononitrate (IMDUR) 60 MG 24 hr tablet Take 90 mg by mouth daily.    Marland Kitchen lisinopril (PRINIVIL,ZESTRIL) 10 MG tablet Take 10 mg by mouth daily.     Marland Kitchen losartan (COZAAR) 50 MG tablet Take 1 tablet by mouth daily.    . metFORMIN (GLUCOPHAGE) 1000 MG tablet Take 2,000 mg by mouth daily.    . metoprolol succinate (TOPROL-XL) 50 MG 24 hr tablet Take 50 mg by mouth daily.  1  .  nitroGLYCERIN (NITROSTAT) 0.4 MG SL tablet Place 0.4 mg under the tongue every 5 (five) minutes as needed for chest pain.    Marland Kitchen oxyCODONE 10 MG TABS Take 1 tablet (10 mg total) by mouth every 8 (eight) hours as needed for severe pain. 10 tablet 0  . pantoprazole (PROTONIX) 40 MG tablet Take 1 tablet (40 mg total) by mouth daily. 90 tablet 1  . promethazine (PHENERGAN) 25 MG tablet Take 25 mg by mouth every 8 (eight) hours as needed for nausea or vomiting.   0  . rosuvastatin (CRESTOR) 10 MG tablet Take 1 tablet (10 mg total) by mouth daily. 90 tablet 1  . sitaGLIPtin (JANUVIA) 100 MG tablet Take 100 mg by mouth daily.    . tamsulosin (FLOMAX) 0.4 MG CAPS capsule Take 1 capsule (0.4 mg total) by mouth daily. 90 capsule 3  . tiotropium (SPIRIVA) 18 MCG inhalation capsule Place 18 mcg into inhaler and inhale 2 (two) times a week.     . traZODone (DESYREL) 50 MG tablet Take 50 mg by mouth at bedtime.    Marland Kitchen zolpidem (AMBIEN) 10 MG tablet Take 10 mg by mouth at bedtime as needed for sleep. for sleep  1  . [DISCONTINUED] fluticasone (FLOVENT HFA) 110 MCG/ACT inhaler Inhale 2 puffs into the lungs 2 (two) times daily. Inhale 1 puff into the lungs 2 times daily.     No current facility-administered medications on file prior to visit.     Allergies  Allergen Reactions  . Advil [Ibuprofen] Itching  . Mobic [Meloxicam]   . Naproxen Nausea Only  . Tramadol   . Vicodin [Hydrocodone-Acetaminophen] Hives  . Cimetidine Other (See Comments) and Rash    headaches Severe headache    History  Alcohol Use  . 1.0 - 1.5 oz/week  . 2 - 3 Standard drinks or equivalent per week    Comment: Drinks occasional beer and wine    History  Smoking Status  . Former Smoker  . Packs/day: 1.00  . Years: 28.00  . Types: Cigarettes  . Quit date: 05/08/2010  Smokeless Tobacco  . Former Systems developer    Review of Systems  Constitutional: Negative.   HENT: Positive for sinus pain.   Eyes: Positive for blurred vision.   Respiratory: Negative.   Cardiovascular: Positive for chest pain.  Gastrointestinal: Positive for heartburn.  Genitourinary: Negative.   Musculoskeletal: Positive for back pain.  Neurological: Positive for headaches.  Endo/Heme/Allergies: Negative.   Psychiatric/Behavioral: Negative.     Objective   Vitals:   08/15/16 1003  BP: (!) 148/94  Pulse: (!) 107  Resp: 18  Temp: 99.5 F (37.5 C)    Physical Exam  Constitutional: He is oriented to person, place, and time and well-developed, well-nourished, and in no distress.  HENT:  Head: Normocephalic and atraumatic.  Neck: Normal range of motion. Neck supple.  Cardiovascular: Normal rate, regular rhythm and normal heart sounds.   No murmur heard. Pulmonary/Chest: Effort normal and breath sounds normal. He has no wheezes. He has no rales.  Neurological: He is alert and oriented to person, place, and time.  Skin:  2 x 3 cm area of pink irregular skin with biopsies scabbed noted.  Vitals reviewed.  Pathology report reviewed Assessment  Basal cell carcinoma, back Plan   Patient is scheduled for excision of basal cell carcinoma back on 08/23/2016. The risks and benefits of the procedure including bleeding, infection, wound separation, and the possibility of needing further excision were fully explained to the patient, who gave informed consent.

## 2016-08-15 NOTE — H&P (Signed)
Derek Chen; [id number]; [birth year]   HPI Patient is a 56 year old white male with history of basal cell carcinoma on the chest and back who presents with a recurrent basal cell carcinoma in the right upper back. This is biopsy-proven and the patient has been referred to my care for formal excision by Dr. Nevada Crane of dermatology. Patient has 9 out of 10 pain secondary to multiple issues. He does have tenderness at the biopsy site on his back. No fever, chills, drainage have been noted.     Past Medical History:  Diagnosis Date  . Anxiety   . Basal cell cancer    Chest and back - status post skin grafting  . Cardiomyopathy (HCC)    LVEF 45-50%  . COPD (chronic obstructive pulmonary disease) ([place])   . Coronary artery disease    NSTEMI - DES mid LAD December 2011 (patent December 2013)  . Dyslipidemia   . Essential hypertension, benign   . Hepatitis B antibody positive 2006  . MI (myocardial infarction) ([place])   . Type 2 diabetes mellitus ([place]) 2012  . Viral pneumonia, unspecified 2004         Past Surgical History:  Procedure Laterality Date  . APPENDECTOMY    . COLONOSCOPY N/A 08/10/2015   Procedure: COLONOSCOPY;  Surgeon: Rogene Houston, MD;  Location: [facility];  Service: Endoscopy;  Laterality: N/A;  730  . CORONARY STENT PLACEMENT    . FINGER SURGERY    . KNEE ARTHROSCOPY     Left  . LEFT HEART CATHETERIZATION WITH CORONARY ANGIOGRAM N/A 03/20/2012   Procedure: LEFT HEART CATHETERIZATION WITH CORONARY ANGIOGRAM;  Surgeon: Thayer Headings, MD;  Location: [facility] CATH LAB;  Service: Cardiovascular;  Laterality: N/A;  . SKIN GRAFT SPLIT THICKNESS TRUNK    01/23/2010    Right chest;  Felicie Morn, M.D.  . VASECTOMY           Family History  Problem Relation Age of Onset  . Diabetes Mother   . Deep vein thrombosis Mother   . Pulmonary embolism Mother   . Cancer Father        hodgins lymphoma  . Diabetes Sister           Current  Outpatient Prescriptions on File Prior to Visit  Medication Sig Dispense Refill  . acetaminophen (TYLENOL) 325 MG tablet Take 2 tablets (650 mg total) by mouth every 4 (four) hours as needed for headache or mild pain.    Marland Kitchen albuterol (PROVENTIL) (2.5 MG/3ML) 0.083% nebulizer solution Take 3 mLs (2.5 mg total) by nebulization every 6 (six) hours as needed for wheezing or shortness of breath. 75 mL 3  . Albuterol Sulfate 108 (90 Base) MCG/ACT AEPB Inhale 1-2 puffs into the lungs daily as needed (for shortness of breath/wheezing).     Marland Kitchen aspirin EC 81 MG tablet Take 1 tablet (81 mg total) by mouth daily.    . budesonide (PULMICORT) 0.5 MG/2ML nebulizer solution Take 0.5 mg by nebulization daily as needed for shortness of breath/wheezing    . butalbital-acetaminophen-caffeine (FIORICET, ESGIC) 50-325-40 MG tablet Take 1 tablet by mouth 3 (three) times daily as needed for headache or migraine. 14 tablet 0  . Cinnamon 500 MG capsule Take 1,000 mg by mouth daily.    . clonazePAM (KLONOPIN) 2 MG tablet Take 0.5 tablets (1 mg total) by mouth 3 (three) times daily as needed for anxiety. 10 tablet 0  . clopidogrel (PLAVIX) 75 MG tablet Take 1 tablet (75 mg  total) by mouth daily. 90 tablet 0  . DULoxetine (CYMBALTA) 30 MG capsule Take 1 capsule (30 mg total) by mouth at bedtime. 30 capsule 0  . DULoxetine (CYMBALTA) 60 MG capsule Take 1 capsule (60 mg total) by mouth daily. 90 capsule 1  . fluticasone (FLONASE) 50 MCG/ACT nasal spray Place 2 sprays into both nostrils daily as needed for allergies or rhinitis.     Marland Kitchen glimepiride (AMARYL) 2 MG tablet Take 2 mg by mouth daily with breakfast.     . isosorbide mononitrate (IMDUR) 60 MG 24 hr tablet Take 90 mg by mouth daily.    Marland Kitchen lisinopril (PRINIVIL,ZESTRIL) 10 MG tablet Take 10 mg by mouth daily.     Marland Kitchen losartan (COZAAR) 50 MG tablet Take 1 tablet by mouth daily.    . metFORMIN (GLUCOPHAGE) 1000 MG tablet Take 2,000 mg by mouth daily.    .  metoprolol succinate (TOPROL-XL) 50 MG 24 hr tablet Take 50 mg by mouth daily.  1  . nitroGLYCERIN (NITROSTAT) 0.4 MG SL tablet Place 0.4 mg under the tongue every 5 (five) minutes as needed for chest pain.    Marland Kitchen oxyCODONE 10 MG TABS Take 1 tablet (10 mg total) by mouth every 8 (eight) hours as needed for severe pain. 10 tablet 0  . pantoprazole (PROTONIX) 40 MG tablet Take 1 tablet (40 mg total) by mouth daily. 90 tablet 1  . promethazine (PHENERGAN) 25 MG tablet Take 25 mg by mouth every 8 (eight) hours as needed for nausea or vomiting.   0  . rosuvastatin (CRESTOR) 10 MG tablet Take 1 tablet (10 mg total) by mouth daily. 90 tablet 1  . sitaGLIPtin (JANUVIA) 100 MG tablet Take 100 mg by mouth daily.    . tamsulosin (FLOMAX) 0.4 MG CAPS capsule Take 1 capsule (0.4 mg total) by mouth daily. 90 capsule 3  . tiotropium (SPIRIVA) 18 MCG inhalation capsule Place 18 mcg into inhaler and inhale 2 (two) times a week.     . traZODone (DESYREL) 50 MG tablet Take 50 mg by mouth at bedtime.    Marland Kitchen zolpidem (AMBIEN) 10 MG tablet Take 10 mg by mouth at bedtime as needed for sleep. for sleep  1  . [DISCONTINUED] fluticasone (FLOVENT HFA) 110 MCG/ACT inhaler Inhale 2 puffs into the lungs 2 (two) times daily. Inhale 1 puff into the lungs 2 times daily.     No current facility-administered medications on file prior to visit.          Allergies  Allergen Reactions  . Advil [Ibuprofen] Itching  . Mobic [Meloxicam]   . Naproxen Nausea Only  . Tramadol   . Vicodin [Hydrocodone-Acetaminophen] Hives  . Cimetidine Other (See Comments) and Rash    headaches Severe headache        History  Alcohol Use  . 1.0 - 1.5 oz/week  . 2 - 3 Standard drinks or equivalent per week    Comment: Drinks occasional beer and wine        History  Smoking Status  . Former Smoker  . Packs/day: 1.00  . Years: 28.00  . Types: Cigarettes  . Quit date: 05/08/2010  Smokeless Tobacco  . Former Systems developer     Review of Systems  Constitutional: Negative.   HENT: Positive for sinus pain.   Eyes: Positive for blurred vision.  Respiratory: Negative.   Cardiovascular: Positive for chest pain.  Gastrointestinal: Positive for heartburn.  Genitourinary: Negative.   Musculoskeletal: Positive for back pain.  Neurological:  Positive for headaches.  Endo/Heme/Allergies: Negative.   Psychiatric/Behavioral: Negative.     Objective      Vitals:   08/15/16 1003  BP: (!) 148/94  Pulse: (!) 107  Resp: 18  Temp: 99.5 F (37.5 C)    Physical Exam  Constitutional: He is oriented to person, place, and time and well-developed, well-nourished, and in no distress.  HENT:  Head: Normocephalic and atraumatic.  Neck: Normal range of motion. Neck supple.  Cardiovascular: Normal rate, regular rhythm and normal heart sounds.   No murmur heard. Pulmonary/Chest: Effort normal and breath sounds normal. He has no wheezes. He has no rales.  Neurological: He is alert and oriented to person, place, and time.  Skin:  2 x 3 cm area of pink irregular skin with biopsies scabbed noted.  Vitals reviewed.  Pathology report reviewed Assessment  Basal cell carcinoma, back Plan   Patient is scheduled for excision of basal cell carcinoma back on 08/23/2016. The risks and benefits of the procedure including bleeding, infection, wound separation, and the possibility of needing further excision were fully explained to the patient, who gave informed consent.

## 2016-08-15 NOTE — Patient Instructions (Signed)
Basal Cell Carcinoma Basal cell carcinoma is the most common form of skin cancer. It begins in the basal cells, which are at the bottom of the outer skin layer (epidermis). It occurs most often on parts of the body that are frequently exposed to the sun, such as:  Parts of the head, including the scalp or face.  Ears.  Neck.  Arms or legs.  Backs of the hands. Basal cell carcinoma can almost always be cured. It rarely spreads to other areas of the body (metastasizes). Basal cell carcinoma may come back at the same location (recur), but it can be treated again if this occurs. What are the causes? This condition is usually caused by exposure to ultraviolet (UV) light. UV light may come from the sun or from tanning beds. Other causes include:  Exposure to arsenic.  Exposure to radiation.  Exposure to toxic tars and oils.  Certain genetic conditions, such as xeroderma pigmentosum. What increases the risk? This condition is more likely to develop in:  People who are older than 56 years of age.  People who have fair skin (light complexion).  People who have blonde or red hair.  People who have blue, green, or Clemons eyes.  People who have childhood freckling.  People who have had sun exposure over long periods of time, especially during childhood.  People who have had repeated sunburns.  People who have a weakened immune system.  People who have been exposed to certain chemicals, such as tar, soot, and arsenic.  People who have chronic inflammatory conditions.  People who have chronic infections.  People who use tanning beds. What are the signs or symptoms? The main symptom of this condition is a growth or lesion on the skin. The shape and color of the growth or lesion may vary. The five main types include:  An open sore that may remain open for 3 weeks or longer. The sore may bleed or crust. This type of lesion can be an early sign of basal cell carcinoma. Basal cell  carcinoma often shows up as a sore that does not heal.  A reddish area that may crust, itch, or cause discomfort. This may occur on areas that are exposed to the sun. These patches might be easier to feel than to see.  A shiny or clear bump that is red, white, or pink. In people who have dark hair, the bump is often tan, black, or brown. These bumps can look like moles.  A pink growth with a raised border. The growth will have a crusted and indented area in the center. Small blood vessels may appear on the surface of the growth as it gets bigger.  A scarlike area that looks like shiny, stretched skin. The area may be white, yellow, or waxy. It often has irregular borders. This may be a sign of more aggressive basal cell carcinoma. How is this diagnosed? This condition may be diagnosed with:  A physical exam.  Removal of a tissue sample to be examined under a microscope (biopsy). How is this treated? Treatment for this condition involves removing the cancerous tissue. The method that is used for this depends on the type, size, location, and number of tumors. Possible treatments include:  Mohs surgery. In this procedure, the cancerous skin cells are removed layer by layer until all of the tumor has been removed.  Surgical removal (excision) of the tumor. This involves removing the entire tumor and a small amount of normal skin that surrounds it.  Cryosurgery. This involves freezing the tumor with liquid nitrogen.  Plastic surgery. The tumor is removed, and healthy skin from another part of the body is used to cover the wound. This may be done for large tumors that are in areas where it is not possible to stretch the nearby skin to sew the edges of the wound together.  Radiation. This may be used for tumors on the face.  Photodynamic therapy. A chemical cream is applied to the skin, and light exposure is used to activate the chemical.  Electrodesiccation and curettage. This involves  alternately scraping and burning the tumor while using an electric current to control bleeding.  Chemical treatments, such as imiquimod cream and interferon injections. These may be used to remove superficial tumors with minimal scarring. Follow these instructions at home:  Avoid unprotected sun exposure.  Do self-exams as told by your health care provider. Look for new spots or changes in your skin.  Keep all follow-up visits as told by your health care provider. This is important. How is this prevented?  Avoid the sun when it is the strongest. This is usually between 10:00 a.m. and 4:00 p.m.  When you are out in the sun, use a sunscreen that has a sun protection factor (SPF) of at least 42.  Apply sunscreen at least 30 minutes before exposure to the sun.  Reapply sunscreen every 2-4 hours while you are outside. Also reapply it after swimming and after excessive sweating.  Always wear hats, protective clothing, and UV-blocking sunglasses when you are outdoors.  Do not use tanning beds. Contact a health care provider if:  You notice any new spots or any changes in your skin.  You have had a basal cell carcinoma tumor removed and you notice a new growth in the same location. This information is not intended to replace advice given to you by your health care provider. Make sure you discuss any questions you have with your health care provider. Document Released: 09/22/2002 Document Revised: 08/24/2015 Document Reviewed: 07/11/2014 Elsevier Interactive Patient Education  2017 [employer].

## 2016-08-19 NOTE — Patient Instructions (Signed)
Derek Chen  08/19/2016     @PREFPERIOPPHARMACY @   Your procedure is scheduled on 08/23/2016.  Report to [facility] at 6:15 A.M.  Call this number if you have problems the morning of surgery:  [phone]   Remember:  Do not eat food or drink liquids after midnight.  Take these medicines the morning of surgery with A SIP OF WATER : Cymbalta, Imdur, Lisinopril, Toprol XL, Percocet, Protonix, Phenergan.  Please use your inhalers and nebulizers  before leaving home and bring it with you to the hospital.   Do not wear jewelry, make-up or nail polish.  Do not wear lotions, powders, or perfumes, or deoderant.  Do not shave 48 hours prior to surgery.  Men may shave face and neck.  Do not bring valuables to the hospital.  [facility] is not responsible for any belongings or valuables.  Contacts, dentures or bridgework may not be worn into surgery.  Leave your suitcase in the car.  After surgery it may be brought to your room.  For patients admitted to the hospital, discharge time will be determined by your treatment team.  Patients discharged the day of surgery will not be allowed to drive home.   Name and phone number of your driver:   family Special instructions:  n/a  Please read over the following fact sheets that you were given. Care and Recovery After Surgery  Lipoma Removal Lipoma removal is a surgical procedure to remove a noncancerous (benign) tumor that is made up of fat cells (lipoma). Most lipomas are small and painless and do not require treatment. They can form in many areas of the body but are most common under the skin of the back, shoulders, arms, and thighs. You may need lipoma removal if you have a lipoma that is large, growing, or causing discomfort. Lipoma removal may also be done for cosmetic reasons. Tell a health care provider about:  Any allergies you have.  All medicines you are taking, including vitamins, herbs, eye drops, creams, and over-the-counter  medicines.  Any problems you or family members have had with anesthetic medicines.  Any blood disorders you have.  Any surgeries you have had.  Any medical conditions you have.  Whether you are pregnant or may be pregnant. What are the risks? Generally, this is a safe procedure. However, problems may occur, including:  Infection.  Bleeding.  Allergic reactions to medicines.  Damage to nerves or blood vessels near the lipoma.  Scarring. What happens before the procedure? Staying hydrated  Follow instructions from your health care provider about hydration, which may include:  Up to 2 hours before the procedure - you may continue to drink clear liquids, such as water, clear fruit juice, black coffee, and plain tea. Eating and drinking restrictions  Follow instructions from your health care provider about eating and drinking, which may include:  8 hours before the procedure - stop eating heavy meals or foods such as meat, fried foods, or fatty foods.  6 hours before the procedure - stop eating light meals or foods, such as toast or cereal.  6 hours before the procedure - stop drinking milk or drinks that contain milk.  2 hours before the procedure - stop drinking clear liquids. Medicines   Ask your health care provider about:  Changing or stopping your regular medicines. This is especially important if you are taking diabetes medicines or blood thinners.  Taking medicines such as aspirin and ibuprofen. These medicines can thin your  blood. Do not take these medicines before your procedure if your health care provider instructs you not to.  You may be given antibiotic medicine to help prevent infection. General instructions   Ask your health care provider how your surgical site will be marked or identified.  You will have a physical exam. Your health care provider will check the size of the lipoma and whether it can be moved easily.  You may have imaging tests, such  as:  X-rays.  CT scan.  MRI.  Plan to have someone take you home from the hospital or clinic. What happens during the procedure?  To reduce your risk of infection:  Your health care team will wash or sanitize their hands.  Your skin will be washed with soap.  You will be given one or more of the following:  A medicine to help you relax (sedative).  A medicine to numb the area (local anesthetic).  A medicine to make you fall asleep (general anesthetic).  A medicine that is injected into an area of your body to numb everything below the injection site (regional anesthetic).  An incision will be made over the lipoma or very near the lipoma. The incision may be made in a natural skin line or crease.  Tissues, nerves, and blood vessels near the lipoma will be moved out of the way.  The lipoma and the capsule that surrounds it will be separated from the surrounding tissues.  The lipoma will be removed.  The incision may be closed with stitches (sutures).  A bandage (dressing) will be placed over the incision. What happens after the procedure?  Do not drive for 24 hours if you received a sedative.  Your blood pressure, heart rate, breathing rate, and blood oxygen level will be monitored until the medicines you were given have worn off. This information is not intended to replace advice given to you by your health care provider. Make sure you discuss any questions you have with your health care provider. Document Released: 06/01/2015 Document Revised: 08/24/2015 Document Reviewed: 06/01/2015 Elsevier Interactive Patient Education  2017 [employer].

## 2016-08-21 ENCOUNTER — Other Ambulatory Visit: Payer: Self-pay

## 2016-08-21 ENCOUNTER — Encounter (HOSPITAL_COMMUNITY)
Admission: RE | Admit: 2016-08-21 | Discharge: 2016-08-21 | Disposition: A | Discharge status: Home / Self Care | Payer: Medicare Other | Source: Ambulatory Visit | Attending: General Surgery | Admitting: General Surgery

## 2016-08-21 ENCOUNTER — Encounter (HOSPITAL_COMMUNITY): Payer: Self-pay

## 2016-08-21 DIAGNOSIS — Z807 Family history of other malignant neoplasms of lymphoid, hematopoietic and related tissues: Secondary | ICD-10-CM | POA: Diagnosis not present

## 2016-08-21 DIAGNOSIS — G4730 Sleep apnea, unspecified: Secondary | ICD-10-CM | POA: Diagnosis not present

## 2016-08-21 DIAGNOSIS — I429 Cardiomyopathy, unspecified: Secondary | ICD-10-CM | POA: Diagnosis not present

## 2016-08-21 DIAGNOSIS — Z7982 Long term (current) use of aspirin: Secondary | ICD-10-CM | POA: Diagnosis not present

## 2016-08-21 DIAGNOSIS — I2510 Atherosclerotic heart disease of native coronary artery without angina pectoris: Secondary | ICD-10-CM | POA: Diagnosis not present

## 2016-08-21 DIAGNOSIS — J449 Chronic obstructive pulmonary disease, unspecified: Secondary | ICD-10-CM | POA: Diagnosis not present

## 2016-08-21 DIAGNOSIS — Z7951 Long term (current) use of inhaled steroids: Secondary | ICD-10-CM | POA: Diagnosis not present

## 2016-08-21 DIAGNOSIS — E785 Hyperlipidemia, unspecified: Secondary | ICD-10-CM | POA: Diagnosis not present

## 2016-08-21 DIAGNOSIS — E119 Type 2 diabetes mellitus without complications: Secondary | ICD-10-CM | POA: Diagnosis not present

## 2016-08-21 DIAGNOSIS — C44519 Basal cell carcinoma of skin of other part of trunk: Secondary | ICD-10-CM | POA: Diagnosis present

## 2016-08-21 DIAGNOSIS — Z886 Allergy status to analgesic agent status: Secondary | ICD-10-CM | POA: Diagnosis not present

## 2016-08-21 DIAGNOSIS — Z79899 Other long term (current) drug therapy: Secondary | ICD-10-CM | POA: Diagnosis not present

## 2016-08-21 DIAGNOSIS — Z8489 Family history of other specified conditions: Secondary | ICD-10-CM | POA: Diagnosis not present

## 2016-08-21 DIAGNOSIS — Z833 Family history of diabetes mellitus: Secondary | ICD-10-CM | POA: Diagnosis not present

## 2016-08-21 DIAGNOSIS — Z7984 Long term (current) use of oral hypoglycemic drugs: Secondary | ICD-10-CM | POA: Diagnosis not present

## 2016-08-21 DIAGNOSIS — Z885 Allergy status to narcotic agent status: Secondary | ICD-10-CM | POA: Diagnosis not present

## 2016-08-21 DIAGNOSIS — F419 Anxiety disorder, unspecified: Secondary | ICD-10-CM | POA: Diagnosis not present

## 2016-08-21 DIAGNOSIS — Z85828 Personal history of other malignant neoplasm of skin: Secondary | ICD-10-CM | POA: Diagnosis not present

## 2016-08-21 DIAGNOSIS — I252 Old myocardial infarction: Secondary | ICD-10-CM | POA: Diagnosis not present

## 2016-08-21 DIAGNOSIS — Z7902 Long term (current) use of antithrombotics/antiplatelets: Secondary | ICD-10-CM | POA: Diagnosis not present

## 2016-08-21 DIAGNOSIS — Z888 Allergy status to other drugs, medicaments and biological substances status: Secondary | ICD-10-CM | POA: Diagnosis not present

## 2016-08-21 DIAGNOSIS — I509 Heart failure, unspecified: Secondary | ICD-10-CM | POA: Diagnosis not present

## 2016-08-21 DIAGNOSIS — I110 Hypertensive heart disease with heart failure: Secondary | ICD-10-CM | POA: Diagnosis not present

## 2016-08-21 HISTORY — DX: Other specified postprocedural states: Z98.890

## 2016-08-21 HISTORY — DX: Gastro-esophageal reflux disease without esophagitis: K21.9

## 2016-08-21 HISTORY — DX: Personal history of urinary calculi: Z87.442

## 2016-08-21 HISTORY — DX: Benign prostatic hyperplasia without lower urinary tract symptoms: N40.0

## 2016-08-21 HISTORY — DX: Nausea with vomiting, unspecified: R11.2

## 2016-08-21 HISTORY — DX: Sleep apnea, unspecified: G47.30

## 2016-08-21 HISTORY — DX: Unspecified osteoarthritis, unspecified site: M19.90

## 2016-08-21 LAB — CBC WITH DIFFERENTIAL/PLATELET
Basophils Absolute: 0 10*3/uL (ref 0.0–0.1)
Basophils Relative: 0 %
Eosinophils Absolute: 0.1 10*3/uL (ref 0.0–0.7)
Eosinophils Relative: 1 %
HCT: 43.5 % (ref 39.0–52.0)
Hemoglobin: 15.5 g/dL (ref 13.0–17.0)
LYMPHS ABS: 1.9 10*3/uL (ref 0.7–4.0)
Lymphocytes Relative: 18 %
MCH: 31.3 pg (ref 26.0–34.0)
MCHC: 35.6 g/dL (ref 30.0–36.0)
MCV: 87.9 fL (ref 78.0–100.0)
MONO ABS: 0.7 10*3/uL (ref 0.1–1.0)
MONOS PCT: 6 %
NEUTROS PCT: 75 %
Neutro Abs: 7.7 10*3/uL (ref 1.7–7.7)
PLATELETS: 235 10*3/uL (ref 150–400)
RBC: 4.95 MIL/uL (ref 4.22–5.81)
RDW: 13.2 % (ref 11.5–15.5)
WBC: 10.4 10*3/uL (ref 4.0–10.5)

## 2016-08-21 LAB — BASIC METABOLIC PANEL
Anion gap: 10 (ref 5–15)
BUN: 13 mg/dL (ref 6–20)
CHLORIDE: 103 mmol/L (ref 101–111)
CO2: 22 mmol/L (ref 22–32)
CREATININE: 0.78 mg/dL (ref 0.61–1.24)
Calcium: 9.2 mg/dL (ref 8.9–10.3)
Glucose, Bld: 166 mg/dL — ABNORMAL HIGH (ref 65–99)
Potassium: 3.8 mmol/L (ref 3.5–5.1)
SODIUM: 135 mmol/L (ref 135–145)

## 2016-08-23 ENCOUNTER — Ambulatory Visit (HOSPITAL_COMMUNITY)
Admission: RE | Admit: 2016-08-23 | Discharge: 2016-08-23 | Disposition: A | Discharge status: Home / Self Care | Payer: Medicare Other | Source: Ambulatory Visit | Attending: General Surgery | Admitting: General Surgery

## 2016-08-23 ENCOUNTER — Encounter (HOSPITAL_COMMUNITY): Payer: Self-pay | Admitting: *Deleted

## 2016-08-23 ENCOUNTER — Encounter (HOSPITAL_COMMUNITY)
Admission: RE | Disposition: A | Discharge status: Home / Self Care | Payer: Self-pay | Source: Ambulatory Visit | Attending: General Surgery

## 2016-08-23 ENCOUNTER — Ambulatory Visit (HOSPITAL_COMMUNITY): Payer: Medicare Other | Admitting: Anesthesiology

## 2016-08-23 DIAGNOSIS — Z8489 Family history of other specified conditions: Secondary | ICD-10-CM | POA: Insufficient documentation

## 2016-08-23 DIAGNOSIS — J449 Chronic obstructive pulmonary disease, unspecified: Secondary | ICD-10-CM | POA: Insufficient documentation

## 2016-08-23 DIAGNOSIS — Z888 Allergy status to other drugs, medicaments and biological substances status: Secondary | ICD-10-CM | POA: Insufficient documentation

## 2016-08-23 DIAGNOSIS — C44519 Basal cell carcinoma of skin of other part of trunk: Secondary | ICD-10-CM | POA: Insufficient documentation

## 2016-08-23 DIAGNOSIS — Z807 Family history of other malignant neoplasms of lymphoid, hematopoietic and related tissues: Secondary | ICD-10-CM | POA: Insufficient documentation

## 2016-08-23 DIAGNOSIS — Z85828 Personal history of other malignant neoplasm of skin: Secondary | ICD-10-CM | POA: Diagnosis not present

## 2016-08-23 DIAGNOSIS — I252 Old myocardial infarction: Secondary | ICD-10-CM | POA: Insufficient documentation

## 2016-08-23 DIAGNOSIS — I2510 Atherosclerotic heart disease of native coronary artery without angina pectoris: Secondary | ICD-10-CM | POA: Insufficient documentation

## 2016-08-23 DIAGNOSIS — E785 Hyperlipidemia, unspecified: Secondary | ICD-10-CM | POA: Insufficient documentation

## 2016-08-23 DIAGNOSIS — G4730 Sleep apnea, unspecified: Secondary | ICD-10-CM | POA: Insufficient documentation

## 2016-08-23 DIAGNOSIS — Z7951 Long term (current) use of inhaled steroids: Secondary | ICD-10-CM | POA: Insufficient documentation

## 2016-08-23 DIAGNOSIS — E119 Type 2 diabetes mellitus without complications: Secondary | ICD-10-CM | POA: Insufficient documentation

## 2016-08-23 DIAGNOSIS — I429 Cardiomyopathy, unspecified: Secondary | ICD-10-CM | POA: Insufficient documentation

## 2016-08-23 DIAGNOSIS — Z7902 Long term (current) use of antithrombotics/antiplatelets: Secondary | ICD-10-CM | POA: Insufficient documentation

## 2016-08-23 DIAGNOSIS — I110 Hypertensive heart disease with heart failure: Secondary | ICD-10-CM | POA: Insufficient documentation

## 2016-08-23 DIAGNOSIS — I509 Heart failure, unspecified: Secondary | ICD-10-CM | POA: Insufficient documentation

## 2016-08-23 DIAGNOSIS — Z885 Allergy status to narcotic agent status: Secondary | ICD-10-CM | POA: Insufficient documentation

## 2016-08-23 DIAGNOSIS — Z7982 Long term (current) use of aspirin: Secondary | ICD-10-CM | POA: Insufficient documentation

## 2016-08-23 DIAGNOSIS — F419 Anxiety disorder, unspecified: Secondary | ICD-10-CM | POA: Insufficient documentation

## 2016-08-23 DIAGNOSIS — Z833 Family history of diabetes mellitus: Secondary | ICD-10-CM | POA: Insufficient documentation

## 2016-08-23 DIAGNOSIS — Z886 Allergy status to analgesic agent status: Secondary | ICD-10-CM | POA: Insufficient documentation

## 2016-08-23 DIAGNOSIS — Z7984 Long term (current) use of oral hypoglycemic drugs: Secondary | ICD-10-CM | POA: Insufficient documentation

## 2016-08-23 DIAGNOSIS — Z79899 Other long term (current) drug therapy: Secondary | ICD-10-CM | POA: Insufficient documentation

## 2016-08-23 HISTORY — PX: MASS EXCISION: SHX2000

## 2016-08-23 LAB — GLUCOSE, CAPILLARY
GLUCOSE-CAPILLARY: 144 mg/dL — AB (ref 65–99)
Glucose-Capillary: 174 mg/dL — ABNORMAL HIGH (ref 65–99)

## 2016-08-23 SURGERY — EXCISION MASS
Anesthesia: General

## 2016-08-23 MED ORDER — PHENYLEPHRINE HCL 10 MG/ML IJ SOLN
INTRAMUSCULAR | Status: DC | PRN
  Administered 2016-08-23 (×10): 40 ug via INTRAVENOUS

## 2016-08-23 MED ORDER — MIDAZOLAM HCL 2 MG/2ML IJ SOLN
1.0000 mg | INTRAMUSCULAR | Status: DC | PRN
  Administered 2016-08-23: 2 mg via INTRAVENOUS

## 2016-08-23 MED ORDER — FENTANYL CITRATE (PF) 250 MCG/5ML IJ SOLN
INTRAMUSCULAR | Status: AC
  Filled 2016-08-23: qty 5

## 2016-08-23 MED ORDER — PROPOFOL 10 MG/ML IV BOLUS
INTRAVENOUS | Status: DC | PRN
  Administered 2016-08-23: 150 mg via INTRAVENOUS
  Administered 2016-08-23: 10 mg via INTRAVENOUS

## 2016-08-23 MED ORDER — SODIUM CHLORIDE 0.9% FLUSH
INTRAVENOUS | Status: AC
  Filled 2016-08-23: qty 10

## 2016-08-23 MED ORDER — OXYCODONE-ACETAMINOPHEN 10-325 MG PO TABS: 1.0000 | ORAL_TABLET | ORAL | 0 refills | Status: DC | PRN

## 2016-08-23 MED ORDER — PHENYLEPHRINE 40 MCG/ML (10ML) SYRINGE FOR IV PUSH (FOR BLOOD PRESSURE SUPPORT)
PREFILLED_SYRINGE | INTRAVENOUS | Status: AC
  Filled 2016-08-23: qty 10

## 2016-08-23 MED ORDER — 0.9 % SODIUM CHLORIDE (POUR BTL) OPTIME
TOPICAL | Status: DC | PRN
  Administered 2016-08-23: 100 mL

## 2016-08-23 MED ORDER — FENTANYL CITRATE (PF) 100 MCG/2ML IJ SOLN
25.0000 ug | INTRAMUSCULAR | Status: DC | PRN
  Administered 2016-08-23 (×2): 25 ug via INTRAVENOUS
  Filled 2016-08-23: qty 2

## 2016-08-23 MED ORDER — PROMETHAZINE HCL 25 MG/ML IJ SOLN
12.5000 mg | Freq: Four times a day (QID) | INTRAMUSCULAR | Status: AC | PRN
  Administered 2016-08-23: 12.5 mg via INTRAVENOUS

## 2016-08-23 MED ORDER — CHLORHEXIDINE GLUCONATE CLOTH 2 % EX PADS: 6.0000 | MEDICATED_PAD | Freq: Once | CUTANEOUS | Status: DC

## 2016-08-23 MED ORDER — BUPIVACAINE HCL (PF) 0.5 % IJ SOLN
INTRAMUSCULAR | Status: AC
  Filled 2016-08-23: qty 30

## 2016-08-23 MED ORDER — SODIUM CHLORIDE 0.9 % IJ SOLN
INTRAMUSCULAR | Status: AC
  Filled 2016-08-23: qty 10

## 2016-08-23 MED ORDER — MIDAZOLAM HCL 2 MG/2ML IJ SOLN
INTRAMUSCULAR | Status: AC
  Filled 2016-08-23: qty 2

## 2016-08-23 MED ORDER — PROMETHAZINE HCL 25 MG PO TABS: 50.0000 mg | ORAL_TABLET | Freq: Four times a day (QID) | ORAL | 1 refills | Status: DC | PRN

## 2016-08-23 MED ORDER — ONDANSETRON HCL 4 MG/2ML IJ SOLN
4.0000 mg | Freq: Once | INTRAMUSCULAR | Status: AC
  Administered 2016-08-23: 4 mg via INTRAVENOUS

## 2016-08-23 MED ORDER — BUPIVACAINE HCL (PF) 0.5 % IJ SOLN
INTRAMUSCULAR | Status: DC | PRN
  Administered 2016-08-23: 6 mL

## 2016-08-23 MED ORDER — FENTANYL CITRATE (PF) 100 MCG/2ML IJ SOLN
25.0000 ug | INTRAMUSCULAR | Status: DC | PRN
  Administered 2016-08-23: 25 ug via INTRAVENOUS

## 2016-08-23 MED ORDER — FENTANYL CITRATE (PF) 100 MCG/2ML IJ SOLN
INTRAMUSCULAR | Status: DC | PRN
  Administered 2016-08-23 (×2): 50 ug via INTRAVENOUS

## 2016-08-23 MED ORDER — PHENYLEPHRINE HCL 10 MG/ML IJ SOLN
INTRAMUSCULAR | Status: AC
  Filled 2016-08-23: qty 1

## 2016-08-23 MED ORDER — PROPOFOL 10 MG/ML IV BOLUS
INTRAVENOUS | Status: AC
  Filled 2016-08-23: qty 20

## 2016-08-23 MED ORDER — POVIDONE-IODINE 10 % EX OINT
TOPICAL_OINTMENT | CUTANEOUS | Status: AC
  Filled 2016-08-23: qty 1

## 2016-08-23 MED ORDER — POVIDONE-IODINE 10 % OINT PACKET
TOPICAL_OINTMENT | CUTANEOUS | Status: DC | PRN
  Administered 2016-08-23: 1 via TOPICAL

## 2016-08-23 MED ORDER — PROMETHAZINE HCL 25 MG/ML IJ SOLN
INTRAMUSCULAR | Status: AC
  Filled 2016-08-23: qty 1

## 2016-08-23 MED ORDER — EPHEDRINE SULFATE 50 MG/ML IJ SOLN
INTRAMUSCULAR | Status: AC
  Filled 2016-08-23: qty 1

## 2016-08-23 MED ORDER — EPHEDRINE SULFATE 50 MG/ML IJ SOLN
INTRAMUSCULAR | Status: DC | PRN
  Administered 2016-08-23 (×3): 5 mg via INTRAVENOUS
  Administered 2016-08-23: 10 mg via INTRAVENOUS
  Administered 2016-08-23 (×2): 5 mg via INTRAVENOUS
  Administered 2016-08-23 (×3): 10 mg via INTRAVENOUS

## 2016-08-23 MED ORDER — LACTATED RINGERS IV SOLN
INTRAVENOUS | Status: DC
  Administered 2016-08-23: 07:00:00 via INTRAVENOUS

## 2016-08-23 MED ORDER — LIDOCAINE HCL (CARDIAC) 10 MG/ML IV SOLN
INTRAVENOUS | Status: DC | PRN
  Administered 2016-08-23: 50 mg via INTRAVENOUS

## 2016-08-23 MED ORDER — LIDOCAINE HCL (PF) 1 % IJ SOLN
INTRAMUSCULAR | Status: AC
  Filled 2016-08-23: qty 5

## 2016-08-23 MED ORDER — ONDANSETRON HCL 4 MG/2ML IJ SOLN
INTRAMUSCULAR | Status: AC
  Filled 2016-08-23: qty 2

## 2016-08-23 MED ORDER — FENTANYL CITRATE (PF) 100 MCG/2ML IJ SOLN
INTRAMUSCULAR | Status: AC
  Filled 2016-08-23: qty 2

## 2016-08-23 SURGICAL SUPPLY — 39 items
BAG HAMPER (MISCELLANEOUS) ×3 IMPLANT
BLADE SURG SZ11 CARB STEEL (BLADE) IMPLANT
CHLORAPREP W/TINT 10.5 ML (MISCELLANEOUS) ×3 IMPLANT
CLOTH BEACON ORANGE TIMEOUT ST (SAFETY) ×3 IMPLANT
COVER LIGHT HANDLE STERIS (MISCELLANEOUS) ×6 IMPLANT
DECANTER SPIKE VIAL GLASS SM (MISCELLANEOUS) ×3 IMPLANT
DERMABOND ADVANCED (GAUZE/BANDAGES/DRESSINGS)
DERMABOND ADVANCED .7 DNX12 (GAUZE/BANDAGES/DRESSINGS) IMPLANT
DRAPE EENT ADH APERT 31X51 STR (DRAPES) IMPLANT
ELECT NEEDLE TIP 2.8 STRL (NEEDLE) IMPLANT
ELECT REM PT RETURN 9FT ADLT (ELECTROSURGICAL) ×3
ELECTRODE REM PT RTRN 9FT ADLT (ELECTROSURGICAL) ×1 IMPLANT
FORMALIN 10 PREFIL 120ML (MISCELLANEOUS) ×3 IMPLANT
GAUZE SPONGE 4X4 12PLY STRL (GAUZE/BANDAGES/DRESSINGS) ×6 IMPLANT
GAUZE SPONGE 4X4 12PLY STRL LF (GAUZE/BANDAGES/DRESSINGS) ×3 IMPLANT
GLOVE BIOGEL PI IND STRL 7.0 (GLOVE) ×1 IMPLANT
GLOVE BIOGEL PI INDICATOR 7.0 (GLOVE) ×2
GLOVE SURG SS PI 7.5 STRL IVOR (GLOVE) ×3 IMPLANT
GOWN STRL REUS W/ TWL XL LVL3 (GOWN DISPOSABLE) ×1 IMPLANT
GOWN STRL REUS W/TWL LRG LVL3 (GOWN DISPOSABLE) ×3 IMPLANT
GOWN STRL REUS W/TWL XL LVL3 (GOWN DISPOSABLE) ×2
KIT ROOM TURNOVER APOR (KITS) ×3 IMPLANT
MANIFOLD NEPTUNE II (INSTRUMENTS) ×3 IMPLANT
NEEDLE 22X1 1/2 OR ONLY (MISCELLANEOUS)
NEEDLE 22X1.5 STRL (OR ONLY) (MISCELLANEOUS) IMPLANT
NEEDLE HYPO 25X1 1.5 SAFETY (NEEDLE) ×3 IMPLANT
NS IRRIG 1000ML POUR BTL (IV SOLUTION) ×3 IMPLANT
PACK MINOR (CUSTOM PROCEDURE TRAY) ×3 IMPLANT
PAD ARMBOARD 7.5X6 YLW CONV (MISCELLANEOUS) ×3 IMPLANT
SET BASIN LINEN APH (SET/KITS/TRAYS/PACK) ×3 IMPLANT
SUT ETHILON 3 0 FSL (SUTURE) IMPLANT
SUT PROLENE 2 0 FS (SUTURE) ×12 IMPLANT
SUT PROLENE 4 0 PS 2 18 (SUTURE) IMPLANT
SUT SILK 0 FSL (SUTURE) ×3 IMPLANT
SUT VIC AB 3-0 SH 27 (SUTURE) ×2
SUT VIC AB 3-0 SH 27X BRD (SUTURE) ×1 IMPLANT
SUT VIC AB 4-0 PS2 27 (SUTURE) IMPLANT
SYR CONTROL 10ML LL (SYRINGE) ×3 IMPLANT
TAPE CLOTH SURG 4X10 WHT LF (GAUZE/BANDAGES/DRESSINGS) ×3 IMPLANT

## 2016-08-23 NOTE — Discharge Instructions (Addendum)
Restart plavix tomorrow.  Take dressing off in two days.  May shower.   Incision Care, Adult An incision is a surgical cut that is made through your skin. Most incisions are closed after surgery. Your incision may be closed with stitches (sutures), staples, skin glue, or adhesive strips. You may need to return to your health care provider to have sutures or staples removed. This may occur several days to several weeks after your surgery. The incision needs to be cared for properly to prevent infection. How to care for your incision Incision care    Follow instructions from your health care provider about how to take care of your incision. Make sure you:  Wash your hands with soap and water before you change the bandage (dressing). If soap and water are not available, use hand sanitizer.  Change your dressing as told by your health care provider.  Leave sutures, skin glue, or adhesive strips in place. These skin closures may need to stay in place for 2 weeks or longer. If adhesive strip edges start to loosen and curl up, you may trim the loose edges. Do not remove adhesive strips completely unless your health care provider tells you to do that.  Check your incision area every day for signs of infection. Check for:  More redness, swelling, or pain.  More fluid or blood.  Warmth.  Pus or a bad smell.  Ask your health care provider how to clean the incision. This may include:  Using mild soap and water.  Using a clean towel to pat the incision dry after cleaning it.  Applying a cream or ointment. Do this only as told by your health care provider.  Covering the incision with a clean dressing.  Ask your health care provider when you can leave the incision uncovered.  Do not take baths, swim, or use a hot tub until your health care provider approves. Ask your health care provider if you can take showers. You may only be allowed to take sponge baths for bathing. Medicines   If you  were prescribed an antibiotic medicine, cream, or ointment, take or apply the antibiotic as told by your health care provider. Do not stop taking or applying the antibiotic even if your condition improves.  Take over-the-counter and prescription medicines only as told by your health care provider. General instructions   Limit movement around your incision to improve healing.  Avoid straining, lifting, or exercise for the first month, or for as long as told by your health care provider.  Follow instructions from your health care provider about returning to your normal activities.  Ask your health care provider what activities are safe.  Protect your incision from the sun when you are outside for the first 6 months, or for as long as told by your health care provider. Apply sunscreen around the scar or cover it up.  Keep all follow-up visits as told by your health care provider. This is important. Contact a health care provider if:  Your have more redness, swelling, or pain around the incision.  You have more fluid or blood coming from the incision.  Your incision feels warm to the touch.  You have pus or a bad smell coming from the incision.  You have a fever or shaking chills.  You are nauseous or you vomit.  You are dizzy.  Your sutures or staples come undone. Get help right away if:  You have a red streak coming from your incision.  Your  incision bleeds through the dressing and the bleeding does not stop with gentle pressure.  The edges of your incision open up and separate.  You have severe pain.  You have a rash.  You are confused.  You faint.  You have trouble breathing and a fast heartbeat. This information is not intended to replace advice given to you by your health care provider. Make sure you discuss any questions you have with your health care provider. Document Released: 10/05/2004 Document Revised: 11/24/2015 Document Reviewed: 10/04/2015 Elsevier  Interactive Patient Education  2017 [place] POST-ANESTHESIA  IMMEDIATELY FOLLOWING SURGERY:  Do not drive or operate machinery for the first twenty four hours after surgery.  Do not make any important decisions for twenty four hours after surgery or while taking narcotic pain medications or sedatives.  If you develop intractable nausea and vomiting or a severe headache please notify your doctor immediately.  FOLLOW-UP:  Please make an appointment with your surgeon as instructed. You do not need to follow up with anesthesia unless specifically instructed to do so.  WOUND CARE INSTRUCTIONS (if applicable):  Keep a dry clean dressing on the anesthesia/puncture wound site if there is drainage.  Once the wound has quit draining you may leave it open to air.  Generally you should leave the bandage intact for twenty four hours unless there is drainage.  If the epidural site drains for more than 36-48 hours please call the anesthesia department.  QUESTIONS?:  Please feel free to call your physician or the hospital operator if you have any questions, and they will be happy to assist you.

## 2016-08-23 NOTE — Anesthesia Procedure Notes (Signed)
Procedure Name: LMA Insertion Date/Time: 08/23/2016 7:36 AM Performed by: Vista Deck Pre-anesthesia Checklist: Patient identified, Patient being monitored, Emergency Drugs available, Timeout performed and Suction available Patient Re-evaluated:Patient Re-evaluated prior to inductionOxygen Delivery Method: Circle System Utilized Preoxygenation: Pre-oxygenation with 100% oxygen Intubation Type: IV induction Ventilation: Mask ventilation without difficulty LMA: LMA inserted LMA Size: 5.0 Number of attempts: 2 Placement Confirmation: positive ETCO2 and breath sounds checked- equal and bilateral Tube secured with: Tape Dental Injury: Teeth and Oropharynx as per pre-operative assessment  Comments: 4 LMA inserted with leak after repositioning. Removed and 5 LMA inserted without difficulty. Minimal leak.

## 2016-08-23 NOTE — Interval H&P Note (Signed)
History and Physical Interval Note:  08/23/2016 7:11 AM  Derek Chen  has presented today for surgery, with the diagnosis of basal cell carcinoma  The various methods of treatment have been discussed with the patient and family. After consideration of risks, benefits and other options for treatment, the patient has consented to  Procedure(s): EXCISION BASAL CELL CARCINOMA ON BACK OVER 4 CM (N/A) as a surgical intervention .  The patient's history has been reviewed, patient examined, no change in status, stable for surgery.  I have reviewed the patient's chart and labs.  Questions were answered to the patient's satisfaction.     Aviva Signs

## 2016-08-23 NOTE — Transfer of Care (Signed)
Immediate Anesthesia Transfer of Care Note  Patient: Derek Chen  Procedure(s) Performed: Procedure(s): EXCISION BASAL CELL CARCINOMA ON BACK OVER 4 CM (N/A)  Patient Location: PACU  Anesthesia Type:General  Level of Consciousness: awake, alert  and patient cooperative  Airway & Oxygen Therapy: Patient Spontanous Breathing and non-rebreather face mask  Post-op Assessment: Report given to RN and Post -op Vital signs reviewed and stable  Post vital signs: Reviewed and stable  Last Vitals:  Vitals:   08/23/16 0720 08/23/16 0725  BP:    Pulse:    Resp: 19 13  Temp:      Last Pain:  Vitals:   08/23/16 0658  TempSrc: Oral      Patients Stated Pain Goal: 7 (46/96/29 5284)  Complications: No apparent anesthesia complications

## 2016-08-23 NOTE — Op Note (Signed)
Patient:  KEDRON UNO  DOB:  03/03/1961  MRN:  [id number]   Preop Diagnosis:  Basal cell carcinoma, back  Postop Diagnosis:  Same  Procedure:  Excision of basal cell carcinoma, back  Surgeon:  Aviva Signs, M.D.  Anes:  Gen.  Indications:  Patient is a 56 year old white male who has had multiple excisions for basal cell carcinoma. Now presents with a large basal cell carcinoma on the back. The risks and benefits of the procedure including bleeding, infection, and the possibility of unclear margins were fully explained to the patient, who gave informed consent.  Procedure note:  The patient was placed in the right lateral decubitus position after general anesthesia was administered. The back was prepped and draped using usual sterile technique with DuraPrep. Surgical site confirmation was performed.  A 3 x 6 cm elliptical transverse incision was made around the basal cell carcinoma. A full-thickness skin excision down to the subcutaneous tissue was performed. A short suture was placed superiorly and a long suture placed laterally for orientation purposes. The specimen was removed using Bovie electrocautery and sent to pathology further examination. 0.5% Sensorcaine was instilled into the surrounding wound. The subcutaneous area was reapproximated using a 3-0 Vicryl interrupted suture. The skin was closed using 2-0 Prolene sutures. Betadine ointment and a dry sterile dressing were applied.  All tape and needle counts were correct at the end of the procedure. Patient was awakened and transferred to PACU in stable condition.  Complications:  None  EBL:  Minimal  Specimen:  Basal cell carcinoma, back

## 2016-08-23 NOTE — Anesthesia Postprocedure Evaluation (Signed)
Anesthesia Post Note  Patient: Derek Chen  Procedure(s) Performed: Procedure(s) (LRB): EXCISION BASAL CELL CARCINOMA ON BACK OVER 4 CM (N/A)  Patient location during evaluation: Short Stay Anesthesia Type: General Level of consciousness: awake and alert Pain management: satisfactory to patient Vital Signs Assessment: post-procedure vital signs reviewed and stable Respiratory status: spontaneous breathing Cardiovascular status: stable Anesthetic complications: no     Last Vitals:  Vitals:   08/23/16 0910 08/23/16 0914  BP:  (!) 91/54  Pulse: 68 76  Resp: (!) 21 20  Temp:  36.4 C    Last Pain:  Vitals:   08/23/16 0914  TempSrc: Oral  PainSc:                  Drucie Opitz

## 2016-08-23 NOTE — Anesthesia Preprocedure Evaluation (Signed)
Anesthesia Evaluation  Patient identified by MRN, date of birth, ID band Patient awake    Reviewed: Allergy & Precautions, NPO status , Patient's Chart, lab work & pertinent test results  History of Anesthesia Complications (+) PONV and history of anesthetic complications  Airway Mallampati: III  TM Distance: <3 FB Neck ROM: Full    Dental  (+) Teeth Intact, Implants, Caps,    Pulmonary shortness of breath and with exertion, sleep apnea , COPD, Current Smoker,    breath sounds clear to auscultation       Cardiovascular hypertension, Pt. on medications + CAD, + Past MI, +CHF and + DOE   Rhythm:Regular Rate:Normal     Neuro/Psych PSYCHIATRIC DISORDERS Anxiety Depression    GI/Hepatic GERD  ,  Endo/Other  diabetes, Type 2  Renal/GU      Musculoskeletal   Abdominal   Peds  Hematology   Anesthesia Other Findings   Reproductive/Obstetrics                             Anesthesia Physical Anesthesia Plan  ASA: III  Anesthesia Plan: General   Post-op Pain Management:    Induction: Intravenous  Airway Management Planned: LMA  Additional Equipment:   Intra-op Plan:   Post-operative Plan: Extubation in OR  Informed Consent: I have reviewed the patients History and Physical, chart, labs and discussed the procedure including the risks, benefits and alternatives for the proposed anesthesia with the patient or authorized representative who has indicated his/her understanding and acceptance.     Plan Discussed with:   Anesthesia Plan Comments:         Anesthesia Quick Evaluation

## 2016-08-27 ENCOUNTER — Other Ambulatory Visit: Payer: Self-pay | Admitting: General Surgery

## 2016-08-27 ENCOUNTER — Encounter (HOSPITAL_COMMUNITY): Payer: Self-pay | Admitting: General Surgery

## 2016-09-10 ENCOUNTER — Ambulatory Visit: Payer: Medicare Other | Admitting: General Surgery

## 2016-10-08 ENCOUNTER — Encounter (INDEPENDENT_AMBULATORY_CARE_PROVIDER_SITE_OTHER): Payer: Self-pay | Admitting: Internal Medicine

## 2016-10-14 ENCOUNTER — Ambulatory Visit (INDEPENDENT_AMBULATORY_CARE_PROVIDER_SITE_OTHER): Payer: Medicare Other | Admitting: Internal Medicine

## 2016-10-15 ENCOUNTER — Ambulatory Visit (INDEPENDENT_AMBULATORY_CARE_PROVIDER_SITE_OTHER): Payer: Medicare Other | Admitting: Internal Medicine

## 2016-11-11 ENCOUNTER — Ambulatory Visit: Payer: Medicare Other | Admitting: Cardiovascular Disease

## 2016-11-11 ENCOUNTER — Encounter: Payer: Self-pay | Admitting: Cardiovascular Disease

## 2016-12-03 ENCOUNTER — Telehealth (INDEPENDENT_AMBULATORY_CARE_PROVIDER_SITE_OTHER): Payer: Self-pay | Admitting: Internal Medicine

## 2016-12-03 MED ORDER — PROMETHAZINE HCL 25 MG PO TABS: ORAL_TABLET | ORAL | 0 refills | Status: DC

## 2016-12-03 NOTE — Addendum Note (Signed)
Addended by: Danie Binder on: 12/03/2016 06:55 PM   Modules accepted: Orders

## 2016-12-03 NOTE — Telephone Encounter (Addendum)
REVIEWED EMR. Pt seen by Dr. Laural Golden in 2017 for TCS. Last Promethazine by Dr. Arnoldo Morale in May 2018. Rx gor 10 pills sent. Additional refills from Sebastian Ache, MD until OPV with Dr. Laural Golden 9/11.

## 2016-12-03 NOTE — Telephone Encounter (Signed)
Patient called, stated that he was a patient of Dr. Laural Golden and that his family doctor would not call in Phenergan for him and he wanted to see if Dr. Laural Golden would call it in.  I advised him that Dr. Laural Golden is out of town this week and Karna Christmas does not prescribe Phenergan.  Karna Christmas was advised.

## 2016-12-09 NOTE — Telephone Encounter (Signed)
Patient has office visit this week.

## 2016-12-10 ENCOUNTER — Ambulatory Visit (INDEPENDENT_AMBULATORY_CARE_PROVIDER_SITE_OTHER): Payer: Medicare Other | Admitting: Internal Medicine

## 2016-12-10 ENCOUNTER — Encounter (INDEPENDENT_AMBULATORY_CARE_PROVIDER_SITE_OTHER): Payer: Self-pay | Admitting: Internal Medicine

## 2016-12-14 ENCOUNTER — Telehealth: Payer: Self-pay | Admitting: Gastroenterology

## 2016-12-14 MED ORDER — PROMETHAZINE HCL 25 MG PO TABS: ORAL_TABLET | ORAL | 0 refills | Status: DC

## 2016-12-14 NOTE — Telephone Encounter (Signed)
Patient missed his last appointment. He can get future prescriptions from PCP. I will also not filled promethazine without office visit.

## 2016-12-14 NOTE — Telephone Encounter (Signed)
PT SHOWED UP LATE FOR APPT WITH DR. Laural Golden. HAD TO BE RSC'D. HAD #20 PHENERGAN SENT SEP 4. WILL REFILL PHENERGAN x1. PT AWARE THAT I WILL NOT REFILL PHENERGAN ANYMORE OVER THE PHONE. RX SENT TO [facility].

## 2017-03-16 ENCOUNTER — Other Ambulatory Visit (INDEPENDENT_AMBULATORY_CARE_PROVIDER_SITE_OTHER): Payer: Self-pay | Admitting: Internal Medicine

## 2017-03-16 MED ORDER — PROMETHAZINE HCL 25 MG PO TABS: 25.0000 mg | ORAL_TABLET | Freq: Two times a day (BID) | ORAL | 0 refills | Status: DC | PRN

## 2017-03-16 NOTE — Telephone Encounter (Signed)
Prescription for Phenergan refilled

## 2017-03-27 ENCOUNTER — Telehealth (INDEPENDENT_AMBULATORY_CARE_PROVIDER_SITE_OTHER): Payer: Self-pay | Admitting: Internal Medicine

## 2017-03-27 NOTE — Telephone Encounter (Signed)
Patient called, stated that he is a patient of Dr. Olevia Perches and is requesting a refill on Loperamide 2mg  to be called to [facility].  [phone]

## 2017-03-27 NOTE — Telephone Encounter (Signed)
I called and advised him that he needed to call his PCP.  He stated that his PCP turned all of this over to Dr. Laural Golden.  He stated that he explained all this to Dr. Laural Golden 2 weeks ago when Dr. Laural Golden called him and called him in 20 tablets of Phenergan.  [phone]

## 2017-03-27 NOTE — Telephone Encounter (Signed)
Patient will need to get from PCP.

## 2017-03-28 NOTE — Telephone Encounter (Signed)
Patient was called and advised that Dr. Laural Golden stated that he will either need to get his medication from his PCP or schedule an appointment to come in and see Deberah Castle, NP.  He made an appointment with Terri for 04/09/17 at 2:45pm.

## 2017-03-28 NOTE — Telephone Encounter (Signed)
Per Dr.Rehmam the patient will need to have OV or he may get medication from PCP.

## 2017-04-09 ENCOUNTER — Ambulatory Visit (INDEPENDENT_AMBULATORY_CARE_PROVIDER_SITE_OTHER): Payer: Medicare Other | Admitting: Internal Medicine

## 2017-04-16 ENCOUNTER — Emergency Department (HOSPITAL_COMMUNITY): Payer: Medicare Other

## 2017-04-16 ENCOUNTER — Encounter (HOSPITAL_COMMUNITY): Payer: Self-pay | Admitting: *Deleted

## 2017-04-16 ENCOUNTER — Observation Stay (HOSPITAL_COMMUNITY)
Admission: EM | Admit: 2017-04-16 | Discharge: 2017-04-18 | Disposition: A | Discharge status: Home / Self Care | Payer: Medicare Other | Attending: Internal Medicine | Admitting: Internal Medicine

## 2017-04-16 ENCOUNTER — Other Ambulatory Visit: Payer: Self-pay

## 2017-04-16 DIAGNOSIS — F329 Major depressive disorder, single episode, unspecified: Secondary | ICD-10-CM | POA: Diagnosis not present

## 2017-04-16 DIAGNOSIS — I10 Essential (primary) hypertension: Secondary | ICD-10-CM | POA: Diagnosis not present

## 2017-04-16 DIAGNOSIS — R0789 Other chest pain: Principal | ICD-10-CM | POA: Insufficient documentation

## 2017-04-16 DIAGNOSIS — Z7901 Long term (current) use of anticoagulants: Secondary | ICD-10-CM | POA: Diagnosis not present

## 2017-04-16 DIAGNOSIS — F419 Anxiety disorder, unspecified: Secondary | ICD-10-CM | POA: Diagnosis present

## 2017-04-16 DIAGNOSIS — J449 Chronic obstructive pulmonary disease, unspecified: Secondary | ICD-10-CM | POA: Insufficient documentation

## 2017-04-16 DIAGNOSIS — F17210 Nicotine dependence, cigarettes, uncomplicated: Secondary | ICD-10-CM | POA: Diagnosis not present

## 2017-04-16 DIAGNOSIS — E119 Type 2 diabetes mellitus without complications: Secondary | ICD-10-CM | POA: Diagnosis not present

## 2017-04-16 DIAGNOSIS — R45851 Suicidal ideations: Secondary | ICD-10-CM

## 2017-04-16 DIAGNOSIS — Z79899 Other long term (current) drug therapy: Secondary | ICD-10-CM | POA: Insufficient documentation

## 2017-04-16 DIAGNOSIS — G4700 Insomnia, unspecified: Secondary | ICD-10-CM | POA: Diagnosis present

## 2017-04-16 DIAGNOSIS — I252 Old myocardial infarction: Secondary | ICD-10-CM | POA: Insufficient documentation

## 2017-04-16 DIAGNOSIS — F32A Depression, unspecified: Secondary | ICD-10-CM | POA: Diagnosis present

## 2017-04-16 DIAGNOSIS — E785 Hyperlipidemia, unspecified: Secondary | ICD-10-CM | POA: Diagnosis present

## 2017-04-16 DIAGNOSIS — I2510 Atherosclerotic heart disease of native coronary artery without angina pectoris: Secondary | ICD-10-CM | POA: Diagnosis present

## 2017-04-16 DIAGNOSIS — R079 Chest pain, unspecified: Secondary | ICD-10-CM | POA: Diagnosis present

## 2017-04-16 LAB — DIFFERENTIAL
BASOS ABS: 0.1 10*3/uL (ref 0.0–0.1)
Basophils Relative: 1 %
EOS ABS: 0.2 10*3/uL (ref 0.0–0.7)
EOS PCT: 2 %
LYMPHS ABS: 2.3 10*3/uL (ref 0.7–4.0)
LYMPHS PCT: 23 %
MONO ABS: 0.7 10*3/uL (ref 0.1–1.0)
Monocytes Relative: 7 %
NEUTROS PCT: 67 %
Neutro Abs: 6.9 10*3/uL (ref 1.7–7.7)

## 2017-04-16 LAB — COMPREHENSIVE METABOLIC PANEL
ALBUMIN: 4 g/dL (ref 3.5–5.0)
ALK PHOS: 69 U/L (ref 38–126)
ALT: 19 U/L (ref 17–63)
ANION GAP: 10 (ref 5–15)
AST: 14 U/L — AB (ref 15–41)
BILIRUBIN TOTAL: 0.7 mg/dL (ref 0.3–1.2)
BUN: 11 mg/dL (ref 6–20)
CALCIUM: 9.2 mg/dL (ref 8.9–10.3)
CO2: 26 mmol/L (ref 22–32)
CREATININE: 0.82 mg/dL (ref 0.61–1.24)
Chloride: 102 mmol/L (ref 101–111)
GFR calc Af Amer: >60 mL/min (ref >60)
GFR calc non Af Amer: >60 mL/min (ref >60)
GLUCOSE: 158 mg/dL — AB (ref 65–99)
Potassium: 4 mmol/L (ref 3.5–5.1)
Sodium: 138 mmol/L (ref 135–145)
TOTAL PROTEIN: 7.4 g/dL (ref 6.5–8.1)

## 2017-04-16 LAB — I-STAT TROPONIN, ED: TROPONIN I, POC: 0 ng/mL (ref 0.00–0.08)

## 2017-04-16 LAB — RAPID URINE DRUG SCREEN, HOSP PERFORMED
Amphetamines: NOT DETECTED
BARBITURATES: NOT DETECTED
Benzodiazepines: NOT DETECTED
Cocaine: NOT DETECTED
Opiates: NOT DETECTED
TETRAHYDROCANNABINOL: NOT DETECTED

## 2017-04-16 LAB — CBC
HCT: 45.7 % (ref 39.0–52.0)
HEMOGLOBIN: 15.3 g/dL (ref 13.0–17.0)
MCH: 30.4 pg (ref 26.0–34.0)
MCHC: 33.5 g/dL (ref 30.0–36.0)
MCV: 90.9 fL (ref 78.0–100.0)
Platelets: 247 10*3/uL (ref 150–400)
RBC: 5.03 MIL/uL (ref 4.22–5.81)
RDW: 13.2 % (ref 11.5–15.5)
WBC: 10.3 10*3/uL (ref 4.0–10.5)

## 2017-04-16 LAB — GLUCOSE, CAPILLARY: Glucose-Capillary: 187 mg/dL — ABNORMAL HIGH (ref 65–99)

## 2017-04-16 LAB — TROPONIN I

## 2017-04-16 LAB — ACETAMINOPHEN LEVEL: Acetaminophen (Tylenol), Serum: <10 ug/mL — ABNORMAL LOW (ref 10–30)

## 2017-04-16 LAB — SALICYLATE LEVEL: Salicylate Lvl: <7 mg/dL (ref 2.8–30.0)

## 2017-04-16 LAB — ETHANOL: Alcohol, Ethyl (B): <10 mg/dL (ref <10)

## 2017-04-16 MED ORDER — CLOPIDOGREL BISULFATE 75 MG PO TABS
75.0000 mg | ORAL_TABLET | Freq: Every day | ORAL | Status: DC
  Administered 2017-04-17 – 2017-04-18 (×2): 75 mg via ORAL
  Filled 2017-04-16 (×2): qty 1

## 2017-04-16 MED ORDER — MORPHINE SULFATE (PF) 4 MG/ML IV SOLN
4.0000 mg | Freq: Once | INTRAVENOUS | Status: AC
  Administered 2017-04-16: 4 mg via INTRAVENOUS
  Filled 2017-04-16: qty 1

## 2017-04-16 MED ORDER — ALBUTEROL SULFATE (2.5 MG/3ML) 0.083% IN NEBU: 2.5000 mg | INHALATION_SOLUTION | Freq: Four times a day (QID) | RESPIRATORY_TRACT | Status: DC | PRN

## 2017-04-16 MED ORDER — ALPRAZOLAM 0.25 MG PO TABS
0.2500 mg | ORAL_TABLET | Freq: Two times a day (BID) | ORAL | Status: DC | PRN
  Filled 2017-04-16: qty 1

## 2017-04-16 MED ORDER — CLONAZEPAM 0.5 MG PO TABS: 1.0000 mg | ORAL_TABLET | Freq: Two times a day (BID) | ORAL | Status: DC | PRN

## 2017-04-16 MED ORDER — LORAZEPAM 2 MG/ML IJ SOLN
0.5000 mg | Freq: Once | INTRAMUSCULAR | Status: AC
  Administered 2017-04-16: 0.5 mg via INTRAVENOUS
  Filled 2017-04-16: qty 1

## 2017-04-16 MED ORDER — ISOSORBIDE MONONITRATE ER 60 MG PO TB24
90.0000 mg | ORAL_TABLET | Freq: Every day | ORAL | Status: DC
  Administered 2017-04-17: 90 mg via ORAL
  Filled 2017-04-16: qty 2

## 2017-04-16 MED ORDER — BUTALBITAL-APAP-CAFFEINE 50-325-40 MG PO TABS
2.0000 | ORAL_TABLET | Freq: Once | ORAL | Status: AC
  Administered 2017-04-16: 2 via ORAL
  Filled 2017-04-16: qty 2

## 2017-04-16 MED ORDER — ROSUVASTATIN CALCIUM 10 MG PO TABS
10.0000 mg | ORAL_TABLET | Freq: Every day | ORAL | Status: DC
  Administered 2017-04-17 – 2017-04-18 (×2): 10 mg via ORAL
  Filled 2017-04-16 (×2): qty 1

## 2017-04-16 MED ORDER — PANTOPRAZOLE SODIUM 40 MG PO TBEC
40.0000 mg | DELAYED_RELEASE_TABLET | Freq: Every day | ORAL | Status: DC
  Administered 2017-04-17 – 2017-04-18 (×2): 40 mg via ORAL
  Filled 2017-04-16 (×2): qty 1

## 2017-04-16 MED ORDER — ENOXAPARIN SODIUM 40 MG/0.4ML ~~LOC~~ SOLN: 40.0000 mg | SUBCUTANEOUS | Status: DC

## 2017-04-16 MED ORDER — LINAGLIPTIN 5 MG PO TABS
5.0000 mg | ORAL_TABLET | Freq: Every day | ORAL | Status: DC
  Administered 2017-04-17 – 2017-04-18 (×2): 5 mg via ORAL
  Filled 2017-04-16 (×2): qty 1

## 2017-04-16 MED ORDER — ZOLPIDEM TARTRATE 5 MG PO TABS
10.0000 mg | ORAL_TABLET | Freq: Every evening | ORAL | Status: DC | PRN
  Administered 2017-04-17 (×2): 10 mg via ORAL
  Filled 2017-04-16 (×2): qty 2

## 2017-04-16 MED ORDER — ACETAMINOPHEN 325 MG PO TABS: 650.0000 mg | ORAL_TABLET | Freq: Four times a day (QID) | ORAL | Status: DC | PRN

## 2017-04-16 MED ORDER — METOPROLOL SUCCINATE ER 50 MG PO TB24
50.0000 mg | ORAL_TABLET | Freq: Every day | ORAL | Status: DC
  Administered 2017-04-17: 50 mg via ORAL
  Filled 2017-04-16: qty 1

## 2017-04-16 MED ORDER — ASPIRIN EC 325 MG PO TBEC
325.0000 mg | DELAYED_RELEASE_TABLET | Freq: Every day | ORAL | Status: DC
Start: 2017-04-17 — End: 2017-04-17
  Filled 2017-04-16: qty 1

## 2017-04-16 MED ORDER — LISINOPRIL 10 MG PO TABS
10.0000 mg | ORAL_TABLET | Freq: Every day | ORAL | Status: DC
  Administered 2017-04-17: 10 mg via ORAL
  Filled 2017-04-16: qty 1

## 2017-04-16 MED ORDER — METFORMIN HCL 500 MG PO TABS
1000.0000 mg | ORAL_TABLET | Freq: Two times a day (BID) | ORAL | Status: DC
  Administered 2017-04-17: 1000 mg via ORAL
  Filled 2017-04-16: qty 2

## 2017-04-16 MED ORDER — ENOXAPARIN SODIUM 40 MG/0.4ML ~~LOC~~ SOLN
40.0000 mg | Freq: Every day | SUBCUTANEOUS | Status: DC
  Administered 2017-04-16 – 2017-04-17 (×2): 40 mg via SUBCUTANEOUS
  Filled 2017-04-16 (×2): qty 0.4

## 2017-04-16 MED ORDER — ONDANSETRON HCL 4 MG/2ML IJ SOLN: 4.0000 mg | Freq: Four times a day (QID) | INTRAMUSCULAR | Status: DC | PRN

## 2017-04-16 MED ORDER — FLUTICASONE PROPIONATE 50 MCG/ACT NA SUSP: 2.0000 | Freq: Every day | NASAL | Status: DC | PRN

## 2017-04-16 MED ORDER — PROMETHAZINE HCL 12.5 MG PO TABS
25.0000 mg | ORAL_TABLET | Freq: Two times a day (BID) | ORAL | Status: DC | PRN
  Administered 2017-04-16 – 2017-04-17 (×2): 25 mg via ORAL
  Filled 2017-04-16 (×2): qty 2

## 2017-04-16 MED ORDER — BUTALBITAL-APAP-CAFFEINE 50-325-40 MG PO TABS
1.0000 | ORAL_TABLET | Freq: Three times a day (TID) | ORAL | Status: DC | PRN
  Administered 2017-04-16 – 2017-04-18 (×4): 1 via ORAL
  Filled 2017-04-16 (×4): qty 1

## 2017-04-16 MED ORDER — INSULIN ASPART 100 UNIT/ML ~~LOC~~ SOLN
0.0000 [IU] | Freq: Three times a day (TID) | SUBCUTANEOUS | Status: DC
  Administered 2017-04-17: 3 [IU] via SUBCUTANEOUS
  Administered 2017-04-17 – 2017-04-18 (×2): 2 [IU] via SUBCUTANEOUS
  Administered 2017-04-18: 1 [IU] via SUBCUTANEOUS

## 2017-04-16 MED ORDER — ACETAMINOPHEN 325 MG PO TABS
650.0000 mg | ORAL_TABLET | Freq: Once | ORAL | Status: AC
  Administered 2017-04-16: 650 mg via ORAL
  Filled 2017-04-16: qty 2

## 2017-04-16 MED ORDER — GLIMEPIRIDE 2 MG PO TABS
4.0000 mg | ORAL_TABLET | Freq: Every day | ORAL | Status: DC
  Administered 2017-04-17: 4 mg via ORAL
  Filled 2017-04-16: qty 2

## 2017-04-16 MED ORDER — NICOTINE 21 MG/24HR TD PT24
21.0000 mg | MEDICATED_PATCH | Freq: Once | TRANSDERMAL | Status: AC
  Administered 2017-04-16: 21 mg via TRANSDERMAL
  Filled 2017-04-16: qty 1

## 2017-04-16 MED ORDER — BUDESONIDE 0.5 MG/2ML IN SUSP
0.5000 mg | Freq: Two times a day (BID) | RESPIRATORY_TRACT | Status: DC
  Administered 2017-04-17 – 2017-04-18 (×3): 0.5 mg via RESPIRATORY_TRACT
  Filled 2017-04-16 (×3): qty 2

## 2017-04-16 MED ORDER — NITROGLYCERIN 0.4 MG SL SUBL: 0.4000 mg | SUBLINGUAL_TABLET | SUBLINGUAL | Status: DC | PRN

## 2017-04-16 MED ORDER — TAMSULOSIN HCL 0.4 MG PO CAPS
0.4000 mg | ORAL_CAPSULE | Freq: Every day | ORAL | Status: DC
  Administered 2017-04-17 – 2017-04-18 (×2): 0.4 mg via ORAL
  Filled 2017-04-16 (×3): qty 1

## 2017-04-16 MED ORDER — DULOXETINE HCL 60 MG PO CPEP
60.0000 mg | ORAL_CAPSULE | Freq: Every day | ORAL | Status: DC
  Administered 2017-04-17 – 2017-04-18 (×2): 60 mg via ORAL
  Filled 2017-04-16 (×2): qty 1

## 2017-04-16 NOTE — ED Notes (Signed)
Pt is here for SI.  Pt reports that his plan would be to shoot himself.  Pt states that he does not own a gun.  He lives with his mother and she owns a gun but he states that he does not know where it is

## 2017-04-16 NOTE — ED Provider Notes (Signed)
[facility] EMERGENCY DEPARTMENT Provider Note   CSN: [id number] Arrival date & time: 04/16/17  1548     History   Chief Complaint Chief Complaint  Patient presents with  . Suicidal  . Chest Pain    HPI [name] is a 57 y.o. male.  Patient complains of chest pain.  He states that he called the ambulance because he is depressed and suicidal.  Then started having chest pain.  Patient has a history of an MI   The history is provided by the patient.  Chest Pain   This is a new problem. The current episode started more than 2 days ago. The problem occurs constantly. The problem has not changed since onset.The pain is associated with exertion. The pain is present in the substernal region. The pain is at a severity of 8/10. The pain is moderate. The quality of the pain is described as dull. The pain does not radiate. Pertinent negatives include no abdominal pain, no back pain, no cough and no headaches.  Pertinent negatives for past medical history include no seizures.    Past Medical History:  Diagnosis Date  . Anxiety   . Arthritis   . Basal cell cancer    Chest and back - status post skin grafting  . BPH (benign prostatic hyperplasia)   . Cardiomyopathy (HCC)    LVEF 45-50%  . COPD (chronic obstructive pulmonary disease) ([place])   . Coronary artery disease    NSTEMI - DES mid LAD December 2011 (patent December 2013)  . Dyslipidemia   . Essential hypertension, benign   . GERD (gastroesophageal reflux disease)   . Hepatitis B antibody positive 2006  . History of kidney stones   . MI (myocardial infarction) ([place])    3 MI's last in 2016.  Marland Kitchen PONV (postoperative nausea and vomiting)    pt states that zofran and scope patch did not help PONV after last surgery.  . Sleep apnea   . Type 2 diabetes mellitus ([place]) 2012  . Viral pneumonia, unspecified 2004    Patient Active Problem List   Diagnosis Date Noted  . Atypical chest pain 11/15/2015  . Hypokalemia 09/15/2015    . Chest pain 09/15/2015  . Insomnia 09/15/2015  . Depression 09/15/2015  . 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 Non-insulin dependent type 2 diabetes mellitus ([place])   . Erectile dysfunction associated with type 2 diabetes mellitus ([place]) 01/21/2014  . COPD, moderate ([place]) 07/15/2013  . Diabetes ([place]) 11/06/2012  . BPH (benign prostatic hyperplasia) 11/06/2012  . 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 Palpitations 03/29/2012  . Dyspnea and respiratory abnormality 03/29/2012  . Breath shortness 03/29/2012  . Acute myocardial infarction, subendocardial infarction ([place]) 03/27/2012  . Heart disease 03/27/2012  . 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 Coronary atherosclerosis 03/27/2012  . Acute non-ST segment elevation myocardial infarction ([place]) 03/27/2012  . Left ventricular dysfunction 03/27/2012  . Anxiety 03/27/2012  . Essential hypertension, benign   . Basal cell carcinoma   . Coronary atherosclerosis of native coronary artery   . Hyperlipidemia with target LDL less than 100 04/19/2010    Past Surgical History:  Procedure Laterality Date  . APPENDECTOMY    . COLONOSCOPY N/A 08/10/2015   Procedure: COLONOSCOPY;  Surgeon: Rogene Houston, MD;  Location: [facility] ENDO SUITE;  Service: Endoscopy;  Laterality: N/A;  730  . CORONARY STENT PLACEMENT    . FINGER SURGERY Right    index, gnagrene  in finger.  Marland Kitchen KNEE ARTHROSCOPY     Left  . LEFT HEART CATHETERIZATION WITH CORONARY ANGIOGRAM N/A 03/20/2012   Procedure: LEFT HEART CATHETERIZATION WITH CORONARY ANGIOGRAM;  Surgeon: Thayer Headings, MD;  Location: [facility] CATH LAB;  Service: Cardiovascular;  Laterality: N/A;  . MASS EXCISION N/A 08/23/2016   Procedure: EXCISION BASAL CELL CARCINOMA ON BACK OVER 4 CM;  Surgeon: Aviva Signs, MD;  Location: [facility] ORS;  Service: General;  Laterality: N/A;  . PORT-A-CATH REMOVAL    . PORTACATH PLACEMENT    . SKIN GRAFT SPLIT THICKNESS TRUNK     01/23/2010    Right chest;  Felicie Morn, M.D.  . VASECTOMY         Home Medications    Prior to Admission medications   Medication Sig Start Date End Date Taking? Authorizing Provider  albuterol (PROVENTIL) (2.5 MG/3ML) 0.083% nebulizer solution Take 3 mLs (2.5 mg total) by nebulization every 6 (six) hours as needed for wheezing or shortness of breath. 11/10/13  Yes Martin, Mary-Margaret, FNP  Albuterol Sulfate 108 (90 Base) MCG/ACT AEPB Inhale 1-2 puffs into the lungs daily as needed (for shortness of breath/wheezing).  10/04/15  Yes [provider]  aspirin EC 325 MG tablet Take 325 mg by mouth daily.   Yes [provider]  budesonide (PULMICORT) 0.5 MG/2ML nebulizer solution Take 0.5 mg by nebulization daily as needed for shortness of breath/wheezing   Yes [provider]  butalbital-acetaminophen-caffeine (FIORICET, ESGIC) 50-325-40 MG tablet Take 1 tablet by mouth 3 (three) times daily as needed for headache or migraine. 11/19/15  Yes Hosie Poisson, MD  Cinnamon 500 MG capsule Take 1,000 mg by mouth daily.    Yes [provider]  clonazePAM (KLONOPIN) 1 MG tablet Take 1 mg by mouth 2 (two) times daily as needed for anxiety.   Yes [provider]  clopidogrel (PLAVIX) 75 MG tablet Take 1 tablet (75 mg total) by mouth daily. 08/15/15  Yes Rehman, Mechele Dawley, MD  DULoxetine (CYMBALTA) 60 MG capsule Take 1 capsule (60 mg total) by mouth daily. 03/17/14  Yes Martin, Mary-Margaret, FNP  fluticasone (FLONASE) 50 MCG/ACT nasal spray Place 2 sprays into both nostrils daily as needed for allergies or rhinitis.  08/09/13  Yes Martin, Mary-Margaret, FNP  glimepiride (AMARYL) 4 MG tablet Take 4 mg by mouth daily with breakfast.   Yes [provider]  isosorbide mononitrate (IMDUR) 60 MG 24 hr tablet Take 90 mg by mouth daily. 09/06/15  Yes [provider]  lisinopril (PRINIVIL,ZESTRIL) 10 MG tablet Take 10 mg by mouth daily.  09/18/15  Yes  [provider]  metFORMIN (GLUCOPHAGE) 1000 MG tablet Take 1,000 mg by mouth 2 (two) times daily.  03/22/12  Yes [provider]  metoprolol succinate (TOPROL-XL) 50 MG 24 hr tablet Take 50 mg by mouth daily. 10/10/15  Yes [provider]  nitroGLYCERIN (NITROSTAT) 0.4 MG SL tablet Place 0.4 mg under the tongue every 5 (five) minutes as needed for chest pain.   Yes [provider]  pantoprazole (PROTONIX) 40 MG tablet Take 1 tablet (40 mg total) by mouth daily. 11/10/13  Yes Hassell Done, Mary-Margaret, FNP  promethazine (PHENERGAN) 25 MG tablet Take 1 tablet (25 mg total) by mouth 2 (two) times daily as needed for nausea or vomiting. 03/16/17  Yes Rehman, Mechele Dawley, MD  rosuvastatin (CRESTOR) 10 MG tablet Take 1 tablet (10 mg total) by mouth daily. 11/10/13  Yes Martin, Mary-Margaret, FNP  sitaGLIPtin (JANUVIA) 100 MG  tablet Take 100 mg by mouth daily. 10/23/15  Yes [provider]  tamsulosin (FLOMAX) 0.4 MG CAPS capsule Take 1 capsule (0.4 mg total) by mouth daily. 03/17/14  Yes Martin, Mary-Margaret, FNP  zolpidem (AMBIEN) 10 MG tablet Take 10 mg by mouth at bedtime as needed for sleep. for sleep 11/28/15  Yes [provider]    Family History Family History  Problem Relation Age of Onset  . Diabetes Mother   . Deep vein thrombosis Mother   . Pulmonary embolism Mother   . Cancer Father        hodgins lymphoma  . Diabetes Sister     Social History Social History   Tobacco Use  . Smoking status: Current Every Day Smoker    Packs/day: 0.25    Years: 35.00    Pack years: 8.75    Types: Cigarettes  . Smokeless tobacco: Former Systems developer    Types: [place] date: 08/22/2006  Substance Use Topics  . Alcohol use: Yes    Alcohol/week: 1.0 - 1.5 oz    Types: 2 - 3 Standard drinks or equivalent per week    Comment: Drinks occasional beer and wine  . Drug use: No     Allergies   Ketorolac; Advil [ibuprofen]; Dilaudid [hydromorphone]; Mobic  [meloxicam]; Naproxen; Tramadol; Vicodin [hydrocodone-acetaminophen]; Cimetidine; Gabapentin; and Tape   Review of Systems Review of Systems  Constitutional: Negative for appetite change and fatigue.  HENT: Negative for congestion, ear discharge and sinus pressure.   Eyes: Negative for discharge.  Respiratory: Negative for cough.   Cardiovascular: Positive for chest pain.  Gastrointestinal: Negative for abdominal pain and diarrhea.  Genitourinary: Negative for frequency and hematuria.  Musculoskeletal: Negative for back pain.  Skin: Negative for rash.  Neurological: Negative for seizures and headaches.  Psychiatric/Behavioral: Positive for dysphoric mood. Negative for hallucinations.     Physical Exam Updated Vital Signs BP 113/79   Pulse 69   Temp 97.7 F (36.5 C) (Oral)   Resp 18   Ht 5\' 11"  (1.803 m)   Wt 71.2 kg (157 lb)   SpO2 98%   BMI 21.90 kg/m   Physical Exam  Constitutional: He is oriented to person, place, and time. He appears well-developed.  HENT:  Head: Normocephalic.  Eyes: Conjunctivae and EOM are normal. No scleral icterus.  Neck: Neck supple. No thyromegaly present.  Cardiovascular: Normal rate and regular rhythm. Exam reveals no gallop and no friction rub.  No murmur heard. Pulmonary/Chest: No stridor. He has no wheezes. He has no rales. He exhibits no tenderness.  Abdominal: He exhibits no distension. There is no tenderness. There is no rebound.  Musculoskeletal: Normal range of motion. He exhibits no edema.  Lymphadenopathy:    He has no cervical adenopathy.  Neurological: He is oriented to person, place, and time. He exhibits normal muscle tone. Coordination normal.  Skin: No rash noted. No erythema.  Psychiatric:  Depressed not suicidal     ED Treatments / Results  Labs (all labs ordered are listed, but only abnormal results are displayed) Labs Reviewed  COMPREHENSIVE METABOLIC PANEL - Abnormal; Notable for the following components:       Result Value   Glucose, Bld 158 (*)    AST 14 (*)    All other components within normal limits  ACETAMINOPHEN LEVEL - Abnormal; Notable for the following components:   Acetaminophen (Tylenol), Serum <10 (*)    All other components within normal limits  ETHANOL  SALICYLATE LEVEL  CBC  RAPID URINE DRUG SCREEN, HOSP PERFORMED  DIFFERENTIAL  TROPONIN I  I-STAT TROPONIN, ED    EKG  EKG Interpretation  Date/Time:  Wednesday April 16 2017 16:02:37 EST Ventricular Rate:  58 PR Interval:    QRS Duration: 143 QT Interval:  460 QTC Calculation: 452 R Axis:   38 Text Interpretation:  Sinus rhythm Right bundle branch block Abnormal lateral Q waves Confirmed by Milton Ferguson [phone]) on 04/16/2017 4:19:19 PM       Radiology Dg Chest 2 View  Result Date: 04/16/2017 CLINICAL DATA:  Chest pain. EXAM: CHEST  2 VIEW COMPARISON:  Chest x-ray dated December 03, 2015. FINDINGS: The heart size and mediastinal contours are within normal limits. Normal pulmonary vascularity. Coarsened interstitial markings are similar to prior study. No focal consolidation, pleural effusion, or pneumothorax. No acute osseous abnormality. IMPRESSION: Chronic bronchitic changes.  No active cardiopulmonary disease. Electronically Signed   By: Titus Dubin M.D.   On: 04/16/2017 16:39    Procedures Procedures (including critical care time)  Medications Ordered in ED Medications  nicotine (NICODERM CQ - dosed in mg/24 hours) patch 21 mg (not administered)  morphine 4 MG/ML injection 4 mg (4 mg Intravenous Given 04/16/17 1715)  acetaminophen (TYLENOL) tablet 650 mg (650 mg Oral Given 04/16/17 1846)     Initial Impression / Assessment and Plan / ED Course  I have reviewed the triage vital signs and the nursing notes.  Pertinent labs & imaging results that were available during my care of the patient were reviewed by me and considered in my medical decision making (see chart for details).     Patient has  continued chest pain.  I spoke with cardiology and it was felt since the patient had a normal troponin and no acute changes on his EKG that he could be admitted here at [facility].  Patient is no longer suicidal but he is very depressed.  Final Clinical Impressions(s) / ED Diagnoses   Final diagnoses:  Atypical chest pain    ED Discharge Orders    None       Milton Ferguson, MD 04/16/17 1916

## 2017-04-16 NOTE — ED Notes (Signed)
Contacted Dr. Bronson Ing office. Reported would contact Dr. Roderic Palau at [phone].

## 2017-04-16 NOTE — ED Notes (Signed)
Call to 3A, RN to call back 

## 2017-04-16 NOTE — ED Notes (Signed)
Spoke with EDP regarding HA.  New orders received

## 2017-04-16 NOTE — H&P (Signed)
History and Physical    TUAN TIPPIN OIT:[id number] DOB: [birth year] DOA: 04/16/2017  PCP: Kennyth Lose, PA-C   Patient coming from: Home.  I have personally briefly reviewed patient's old medical records in [place]  Chief Complaint: Chest pain  HPI: Derek Chen is a 57 y.o. male with medical history significant of anxiety, osteoarthritis, basal cell cancer chest and back, BPH, dyslipidemia, essential hypertension, GERD, hepatitis B antibody positive, urolithiasis, sleep apnea, type 2 diabetes, history of viral pneumonia, cardiomyopathy with LVEF of 45-50%, CAD, history of NSTEMI who presented to the emergency department via EMS who were initially called by the patient since he would has been feeling depressed for the past 4-6 weeks, having suicidal ideations without any specific plan, hearing voices that tell him that he is worthless, having BV dreams that he is about to get killed by someone, feeling that the walls around him are closing in, insomnia and only sleeping 3-4 hours at night, anxiety with occasional panic attacks, with palpitations, diaphoresis and chest pains.  He mentions that sometimes his nitroglycerin helps with chest pain, but other times he does not.  Earlier today, while the ambulance was on his way, he developed precordial chest pain radiated to his left arm, sharp and pressure-like, associated with severe dyspnea, diaphoresis, palpitations, nausea and dizziness, which did not get enough relief from nitroglycerin sublingually or morphine 4 mg IVP in the emergency department.  However, he feels better after being given lorazepam 0.5 mg IVP x1.    He has been taking Fioricet for chronic headaches as well.  He denies fever, chills, sore throat, wheezing, hemoptysis, but complains of frequent cough which is occasionally productive of yellowish sputum.  Denies abdominal pain, diarrhea, constipation, melena or hematochezia he denies dysuria, frequency or hematuria.   No blurred vision  ED Course:  Initial vital signs temperature 97.69F, pulse 87, respirations 18, blood pressure 133/79 mmHg and O2 sat 98% on room air.  EKG shows sinus rhythm with right bundle branch block which is old.  Troponin was negative.  Urine toxicology was negative.  CBC was normal.  His CMP showed a glucose of 158 mg/dL, all other values were unremarkable.  Ethanol, salicylate and acetaminophen were normal.  His chest radiograph did not show any active cardiopulmonary disease, but showed chronic bronchitic changes.  Review of Systems: As per HPI otherwise 10 point review of systems negative.    Past Medical History:  Diagnosis Date  . Anxiety   . Arthritis   . Basal cell cancer    Chest and back - status post skin grafting  . BPH (benign prostatic hyperplasia)   . Cardiomyopathy (HCC)    LVEF 45-50%  . COPD (chronic obstructive pulmonary disease) ([place])   . Coronary artery disease    NSTEMI - DES mid LAD December 2011 (patent December 2013)  . Dyslipidemia   . Essential hypertension, benign   . GERD (gastroesophageal reflux disease)   . Hepatitis B antibody positive 2006  . History of kidney stones   . MI (myocardial infarction) ([place])    3 MI's last in 2016.  Marland Kitchen PONV (postoperative nausea and vomiting)    pt states that zofran and scope patch did not help PONV after last surgery.  . Sleep apnea   . Type 2 diabetes mellitus ([place]) 2012  . Viral pneumonia, unspecified 2004    Past Surgical History:  Procedure Laterality Date  . APPENDECTOMY    . COLONOSCOPY N/A 08/10/2015  Procedure: COLONOSCOPY;  Surgeon: Rogene Houston, MD;  Location: [facility]O SUITE;  Service: Endoscopy;  Laterality: N/A;  730  . CORONARY STENT PLACEMENT    . FINGER SURGERY Right    index, gnagrene in finger.  Marland Kitchen KNEE ARTHROSCOPY     Left  . LEFT HEART CATHETERIZATION WITH CORONARY ANGIOGRAM N/A 03/20/2012   Procedure: LEFT HEART CATHETERIZATION WITH CORONARY ANGIOGRAM;  Surgeon: Thayer Headings,  MD;  Location: [facility] CATH LAB;  Service: Cardiovascular;  Laterality: N/A;  . MASS EXCISION N/A 08/23/2016   Procedure: EXCISION BASAL CELL CARCINOMA ON BACK OVER 4 CM;  Surgeon: Aviva Signs, MD;  Location: [facility] ORS;  Service: General;  Laterality: N/A;  . PORT-A-CATH REMOVAL    . PORTACATH PLACEMENT    . SKIN GRAFT SPLIT THICKNESS TRUNK    01/23/2010    Right chest;  Felicie Morn, M.D.  . VASECTOMY       reports that he has been smoking cigarettes.  He has a 8.75 pack-year smoking history. He quit smokeless tobacco use about 10 years ago. His smokeless tobacco use included chew. He reports that he drinks about 1.0 - 1.5 oz of alcohol per week. He reports that he does not use drugs.  Allergies  Allergen Reactions  . Ketorolac Diarrhea  . Advil [Ibuprofen] Itching  . Dilaudid [Hydromorphone] Nausea And Vomiting    Tolerates IV formulation--unable to tolerate pill form  . Mobic [Meloxicam] Other (See Comments)    Severe headaches.  . Naproxen Nausea Only  . Tramadol Hives and Itching  . Vicodin [Hydrocodone-Acetaminophen] Hives  . Cimetidine Other (See Comments) and Rash    headaches Severe headache  . Gabapentin Anxiety    "Neck twitches"  . Tape Rash    Paper tape    Family History  Problem Relation Age of Onset  . Diabetes Mother   . Deep vein thrombosis Mother   . Pulmonary embolism Mother   . Cancer Father        hodgins lymphoma  . Diabetes Sister     Prior to Admission medications   Medication Sig Start Date End Date Taking? Authorizing Provider  albuterol (PROVENTIL) (2.5 MG/3ML) 0.083% nebulizer solution Take 3 mLs (2.5 mg total) by nebulization every 6 (six) hours as needed for wheezing or shortness of breath. 11/10/13  Yes Martin, Mary-Margaret, FNP  Albuterol Sulfate 108 (90 Base) MCG/ACT AEPB Inhale 1-2 puffs into the lungs daily as needed (for shortness of breath/wheezing).  10/04/15  Yes [provider]  aspirin EC 325 MG tablet Take 325 mg by mouth  daily.   Yes [provider]  budesonide (PULMICORT) 0.5 MG/2ML nebulizer solution Take 0.5 mg by nebulization daily as needed for shortness of breath/wheezing   Yes [provider]  butalbital-acetaminophen-caffeine (FIORICET, ESGIC) 50-325-40 MG tablet Take 1 tablet by mouth 3 (three) times daily as needed for headache or migraine. 11/19/15  Yes Hosie Poisson, MD  Cinnamon 500 MG capsule Take 1,000 mg by mouth daily.    Yes [provider]  clonazePAM (KLONOPIN) 1 MG tablet Take 1 mg by mouth 2 (two) times daily as needed for anxiety.   Yes [provider]  clopidogrel (PLAVIX) 75 MG tablet Take 1 tablet (75 mg total) by mouth daily. 08/15/15  Yes Rehman, Mechele Dawley, MD  DULoxetine (CYMBALTA) 60 MG capsule Take 1 capsule (60 mg total) by mouth daily. 03/17/14  Yes Martin, Mary-Margaret, FNP  fluticasone (FLONASE) 50 MCG/ACT nasal spray Place 2 sprays into both  nostrils daily as needed for allergies or rhinitis.  08/09/13  Yes Martin, Mary-Margaret, FNP  glimepiride (AMARYL) 4 MG tablet Take 4 mg by mouth daily with breakfast.   Yes [provider]  isosorbide mononitrate (IMDUR) 60 MG 24 hr tablet Take 90 mg by mouth daily. 09/06/15  Yes [provider]  lisinopril (PRINIVIL,ZESTRIL) 10 MG tablet Take 10 mg by mouth daily.  09/18/15  Yes [provider]  metFORMIN (GLUCOPHAGE) 1000 MG tablet Take 1,000 mg by mouth 2 (two) times daily.  03/22/12  Yes [provider]  metoprolol succinate (TOPROL-XL) 50 MG 24 hr tablet Take 50 mg by mouth daily. 10/10/15  Yes [provider]  nitroGLYCERIN (NITROSTAT) 0.4 MG SL tablet Place 0.4 mg under the tongue every 5 (five) minutes as needed for chest pain.   Yes [provider]  pantoprazole (PROTONIX) 40 MG tablet Take 1 tablet (40 mg total) by mouth daily. 11/10/13  Yes Hassell Done, Mary-Margaret, FNP  promethazine (PHENERGAN) 25 MG tablet Take 1 tablet (25 mg total) by mouth 2 (two)  times daily as needed for nausea or vomiting. 03/16/17  Yes Rehman, Mechele Dawley, MD  rosuvastatin (CRESTOR) 10 MG tablet Take 1 tablet (10 mg total) by mouth daily. 11/10/13  Yes Martin, Mary-Margaret, FNP  sitaGLIPtin (JANUVIA) 100 MG tablet Take 100 mg by mouth daily. 10/23/15  Yes [provider]  tamsulosin (FLOMAX) 0.4 MG CAPS capsule Take 1 capsule (0.4 mg total) by mouth daily. 03/17/14  Yes Martin, Mary-Margaret, FNP  zolpidem (AMBIEN) 10 MG tablet Take 10 mg by mouth at bedtime as needed for sleep. for sleep 11/28/15  Yes [provider]    Physical Exam: Vitals:   04/16/17 1800 04/16/17 1804 04/16/17 2000 04/16/17 2100  BP: 101/82 113/79 126/72 107/74  Pulse:  69    Resp:  18    Temp:      TempSrc:      SpO2:  98%    Weight:      Height:        Constitutional: NAD, calm, comfortable Eyes: PERRL, lids and conjunctivae normal ENMT: Mucous membranes are moist. Posterior pharynx clear of any exudate or lesions. Neck: normal, supple, no masses, no thyromegaly Respiratory: clear to auscultation bilaterally, no wheezing, no crackles. Normal respiratory effort. No accessory muscle use.  Cardiovascular: Regular rate and rhythm, no murmurs / rubs / gallops. No extremity edema. 2+ pedal pulses. No carotid bruits.  Abdomen: no tenderness, no masses palpated. No hepatosplenomegaly. Bowel sounds positive.  Musculoskeletal: no clubbing / cyanosis. Good ROM, no contractures. Normal muscle tone.  Skin: no rashes, lesions, ulcers. No induration Neurologic: CN 2-12 grossly intact. Sensation intact, DTR normal. Strength 5/5 in all 4.  Psychiatric: Alert and oriented x 3.  Mildly anxious mood.    Labs on Admission: I have personally reviewed following labs and imaging studies  CBC: Recent Labs  Lab 04/16/17 1559  WBC 10.3  NEUTROABS 6.9  HGB 15.3  HCT 45.7  MCV 90.9  PLT 884   Basic Metabolic Panel: Recent Labs  Lab 04/16/17 1559  NA 138  K 4.0  CL 102  CO2 26    GLUCOSE 158*  BUN 11  CREATININE 0.82  CALCIUM 9.2   GFR: Estimated Creatinine Clearance: 101.3 mL/min (by C-G formula based on SCr of 0.82 mg/dL). Liver Function Tests: Recent Labs  Lab 04/16/17 1559  AST 14*  ALT 19  ALKPHOS 69  BILITOT 0.7  PROT 7.4  ALBUMIN 4.0  No results for input(s): LIPASE, AMYLASE in the last 168 hours. No results for input(s): AMMONIA in the last 168 hours. Coagulation Profile: No results for input(s): INR, PROTIME in the last 168 hours. Cardiac Enzymes: Recent Labs  Lab 04/16/17 1859  TROPONINI <0.03   BNP (last 3 results) No results for input(s): PROBNP in the last 8760 hours. HbA1C: No results for input(s): HGBA1C in the last 72 hours. CBG: No results for input(s): GLUCAP in the last 168 hours. Lipid Profile: No results for input(s): CHOL, HDL, LDLCALC, TRIG, CHOLHDL, LDLDIRECT in the last 72 hours. Thyroid Function Tests: No results for input(s): TSH, T4TOTAL, FREET4, T3FREE, THYROIDAB in the last 72 hours. Anemia Panel: No results for input(s): VITAMINB12, FOLATE, FERRITIN, TIBC, IRON, RETICCTPCT in the last 72 hours. Urine analysis:    Component Value Date/Time   COLORURINE YELLOW 03/18/2012 0402   APPEARANCEUR CLEAR 03/18/2012 0402   LABSPEC >1.030 (H) 03/18/2012 0402   PHURINE 5.5 03/18/2012 0402   GLUCOSEU NEGATIVE 03/18/2012 0402   HGBUR NEGATIVE 03/18/2012 0402   BILIRUBINUR NEGATIVE 03/18/2012 0402   KETONESUR NEGATIVE 03/18/2012 0402   PROTEINUR NEGATIVE 03/18/2012 0402   UROBILINOGEN 0.2 03/18/2012 0402   NITRITE NEGATIVE 03/18/2012 0402   LEUKOCYTESUR NEGATIVE 03/18/2012 0402    Radiological Exams on Admission: Dg Chest 2 View  Result Date: 04/16/2017 CLINICAL DATA:  Chest pain. EXAM: CHEST  2 VIEW COMPARISON:  Chest x-ray dated December 03, 2015. FINDINGS: The heart size and mediastinal contours are within normal limits. Normal pulmonary vascularity. Coarsened interstitial markings are similar to prior study. No  focal consolidation, pleural effusion, or pneumothorax. No acute osseous abnormality. IMPRESSION: Chronic bronchitic changes.  No active cardiopulmonary disease. Electronically Signed   By: Titus Dubin M.D.   On: 04/16/2017 16:39   Echocardiogram 09/16/2015 ------------------------------------------------------------------- LV EF: 35% -   40%  ------------------------------------------------------------------- Indications:      Chest pain 786.51.  ------------------------------------------------------------------- History:   PMH:  Right bundle branch block with left anterior fascicular block.  Coronary artery disease.  Chronic obstructive pulmonary disease.  PMH:   Myocardial infarction.  Risk factors: Former tobacco use. Hypertension. Diabetes mellitus. Dyslipidemia.   ------------------------------------------------------------------- Study Conclusions  - Left ventricle: The cavity size was normal. Systolic function was   moderately reduced. The estimated ejection fraction was in the   range of 35% to 40%. Hypokinesis of the lateral and inferior   myocardium.   EKG: Independently reviewed Vent. rate 58 BPM PR interval * ms QRS duration 143 ms QT/QTc 460/452 ms P-R-T axes 57 38 53 Sinus rhythm Right bundle branch block Abnormal lateral Q waves  Assessment/Plan Principal Problem:   Chest pain The patient stated that the pain felt like his previous MIs. HEART Score is at least 6. Telemetry/observation. Supplemental oxygen as needed. Trend troponin levels. Continue aspirin, Plavix and beta-blocker. Continue nitroglycerin as needed. Continue isosorbide mononitrate 90 mg p.o. daily. Consult cardiology in a.m. Check echocardiogram.  Active Problems:   Suicidal ideations Has thought about suicide, but denies having any specific plan. Continue treatment for anxiety, insomnia and depression. Consult behavioral health. One to one monitoring    Anxiety Increase  Klonopin to 1 mg p.o. every 6 hours as needed    Insomnia Continue zolpidem 10 mg p.o. daily.    Depression Continue Cymbalta 60 mg p.o. daily.    Hyperlipidemia with target LDL less than 100 Continue Crestor 10 mg p.o. daily. Monitor LFTs as needed. Fasting lipid panel to be followed as an outpatient.  Essential hypertension, benign Continue lisinopril 10 mg p.o. daily. Continue metoprolol succinate 50 mg p.o. daily. Monitor blood pressure, heart rate, renal function and electrolytes.    Coronary atherosclerosis of native coronary artery Continue aspirin, clopidogrel, rosuvastatin and metoprolol.    Non-insulin dependent type 2 diabetes mellitus (HCC) Carbohydrate modified diet. Continue Amaryl 4 mg p.o. daily. Continue Januvia 100 mg p.o. daily. Continue metformin 100 mg p.o. twice daily.    DVT prophylaxis: Lovenox SQ. Code Status: Full code. Family Communication:  Disposition Plan: Observation for chest pain, cardiology and behavioral health evaluation in a.m. Consults called: Routine cardiology and behavioral health consult. Admission status: Observation/telemetry.   Reubin Milan MD [facility] Pager [phone].  If 7PM-7AM, please contact night-coverage [email or web address] Password [facility]  04/16/2017, 10:13 PM   This document was prepared using Dragon voice recognition software and may contain some unintended dictation errors.

## 2017-04-16 NOTE — ED Triage Notes (Signed)
Pt arrives by [facility] for "suicidal thoughts" which he has had for some weeks.  Pt also told ems that he has suditory hallucinations and people are telling him to kill himself.  No Homocidal ideations.  EMS state that during transport he reported CP and was given 18g in LAC, 2 sl nitro which did not relieve his pain and a EKG was done which showed a RBB.  Pt has hx of MI in past.  No distress on arrival.  Pt is alert and orieted, calm.

## 2017-04-16 NOTE — ED Notes (Signed)
Report given to 3A.  Security to wand pt.  4 bags of belongings with pt to go up to 3A

## 2017-04-17 ENCOUNTER — Observation Stay (HOSPITAL_COMMUNITY): Payer: Medicare Other

## 2017-04-17 DIAGNOSIS — I25118 Atherosclerotic heart disease of native coronary artery with other forms of angina pectoris: Secondary | ICD-10-CM | POA: Diagnosis not present

## 2017-04-17 DIAGNOSIS — F419 Anxiety disorder, unspecified: Secondary | ICD-10-CM

## 2017-04-17 DIAGNOSIS — I519 Heart disease, unspecified: Secondary | ICD-10-CM | POA: Diagnosis not present

## 2017-04-17 DIAGNOSIS — E119 Type 2 diabetes mellitus without complications: Secondary | ICD-10-CM | POA: Diagnosis not present

## 2017-04-17 DIAGNOSIS — I10 Essential (primary) hypertension: Secondary | ICD-10-CM

## 2017-04-17 DIAGNOSIS — E785 Hyperlipidemia, unspecified: Secondary | ICD-10-CM

## 2017-04-17 DIAGNOSIS — Z955 Presence of coronary angioplasty implant and graft: Secondary | ICD-10-CM | POA: Diagnosis not present

## 2017-04-17 DIAGNOSIS — I2510 Atherosclerotic heart disease of native coronary artery without angina pectoris: Secondary | ICD-10-CM | POA: Diagnosis not present

## 2017-04-17 DIAGNOSIS — F329 Major depressive disorder, single episode, unspecified: Secondary | ICD-10-CM

## 2017-04-17 DIAGNOSIS — R9431 Abnormal electrocardiogram [ECG] [EKG]: Secondary | ICD-10-CM | POA: Diagnosis not present

## 2017-04-17 DIAGNOSIS — R0789 Other chest pain: Secondary | ICD-10-CM | POA: Diagnosis not present

## 2017-04-17 DIAGNOSIS — R45851 Suicidal ideations: Secondary | ICD-10-CM

## 2017-04-17 LAB — GLUCOSE, CAPILLARY
GLUCOSE-CAPILLARY: 237 mg/dL — AB (ref 65–99)
GLUCOSE-CAPILLARY: 105 mg/dL — AB (ref 65–99)
Glucose-Capillary: 151 mg/dL — ABNORMAL HIGH (ref 65–99)
Glucose-Capillary: 74 mg/dL (ref 65–99)

## 2017-04-17 LAB — TROPONIN I: Troponin I: <0.03 ng/mL (ref <0.03)

## 2017-04-17 MED ORDER — LISINOPRIL 5 MG PO TABS
2.5000 mg | ORAL_TABLET | Freq: Every day | ORAL | Status: DC
  Filled 2017-04-17: qty 1

## 2017-04-17 MED ORDER — CLONAZEPAM 0.5 MG PO TABS
1.0000 mg | ORAL_TABLET | Freq: Four times a day (QID) | ORAL | Status: DC | PRN
  Administered 2017-04-17 (×2): 1 mg via ORAL
  Filled 2017-04-17 (×2): qty 2

## 2017-04-17 MED ORDER — CLONAZEPAM 0.5 MG PO TABS
1.0000 mg | ORAL_TABLET | Freq: Three times a day (TID) | ORAL | Status: DC | PRN
  Administered 2017-04-17 – 2017-04-18 (×2): 1 mg via ORAL
  Filled 2017-04-17 (×2): qty 2

## 2017-04-17 MED ORDER — SODIUM CHLORIDE 0.9 % IV BOLUS (SEPSIS)
500.0000 mL | Freq: Once | INTRAVENOUS | Status: AC
  Administered 2017-04-17: 500 mL via INTRAVENOUS

## 2017-04-17 MED ORDER — ASPIRIN EC 81 MG PO TBEC
81.0000 mg | DELAYED_RELEASE_TABLET | Freq: Every day | ORAL | Status: DC
  Administered 2017-04-17 – 2017-04-18 (×2): 81 mg via ORAL
  Filled 2017-04-17 (×2): qty 1

## 2017-04-17 MED ORDER — ISOSORBIDE MONONITRATE ER 60 MG PO TB24
60.0000 mg | ORAL_TABLET | Freq: Every day | ORAL | Status: DC
  Filled 2017-04-17: qty 1

## 2017-04-17 MED ORDER — METOPROLOL SUCCINATE ER 25 MG PO TB24
12.5000 mg | ORAL_TABLET | Freq: Every day | ORAL | Status: DC
  Filled 2017-04-17: qty 1

## 2017-04-17 MED ORDER — METOPROLOL SUCCINATE ER 25 MG PO TB24: 25.0000 mg | ORAL_TABLET | Freq: Every day | ORAL | Status: DC

## 2017-04-17 MED ORDER — POTASSIUM CHLORIDE IN NACL 20-0.9 MEQ/L-% IV SOLN
INTRAVENOUS | Status: DC
Start: 2017-04-17 — End: 2017-04-18
  Administered 2017-04-17 – 2017-04-18 (×2): via INTRAVENOUS

## 2017-04-17 MED ORDER — NICOTINE 21 MG/24HR TD PT24
21.0000 mg | MEDICATED_PATCH | Freq: Every day | TRANSDERMAL | Status: DC
  Administered 2017-04-17 – 2017-04-18 (×2): 21 mg via TRANSDERMAL
  Filled 2017-04-17 (×2): qty 1

## 2017-04-17 NOTE — BH Assessment (Signed)
Tele Assessment Note   Patient Name: Derek Chen MRN: [id number] Referring Physician: Orson Eva, MD Location of Patient: [facility] Location of Provider: [place]  MURIEL WILBER is a 57 y.o. male in hospital with complaints of suicidal thoughts. Pt indicates that he has an appt at [facility] at the end of the month but he can't wait that long. Pt reports having SI "off and on all my life". Pt reports feeling like "everyone is against me, the walls are closing in, hopeless". Pt reports that he and his wife separated @ 1.5 weeks ago and he's been feeling more suicidal since then. Pt also reports auditory hallucinations telling his he's worthless and other such disparaging things about himself. Pt indicates that he's also been having vivid dreams of getting killed. Pt denies any previous suicide attempts or any psych treatment. Pt denies any specific plan of suicide, but reports not feeling safe at home.   Diagnosis: MDD, recurrent episode, severe, w/ psychotic features  Past Medical History:  Past Medical History:  Diagnosis Date  . Anxiety   . Arthritis   . Basal cell cancer    Chest and back - status post skin grafting  . BPH (benign prostatic hyperplasia)   . Cardiomyopathy (HCC)    LVEF 45-50%  . COPD (chronic obstructive pulmonary disease) ([place])   . Coronary artery disease    NSTEMI - DES mid LAD December 2011 (patent December 2013)  . Dyslipidemia   . Essential hypertension, benign   . GERD (gastroesophageal reflux disease)   . Hepatitis B antibody positive 2006  . History of kidney stones   . MI (myocardial infarction) ([place])    3 MI's last in 2016.  Marland Kitchen PONV (postoperative nausea and vomiting)    pt states that zofran and scope patch did not help PONV after last surgery.  . Sleep apnea   . Type 2 diabetes mellitus ([place]) 2012  . Viral pneumonia, unspecified 2004    Past Surgical History:  Procedure Laterality Date  . APPENDECTOMY    . COLONOSCOPY  N/A 08/10/2015   Procedure: COLONOSCOPY;  Surgeon: Rogene Houston, MD;  Location: [facility];  Service: Endoscopy;  Laterality: N/A;  730  . CORONARY STENT PLACEMENT    . FINGER SURGERY Right    index, gnagrene in finger.  Marland Kitchen KNEE ARTHROSCOPY     Left  . LEFT HEART CATHETERIZATION WITH CORONARY ANGIOGRAM N/A 03/20/2012   Procedure: LEFT HEART CATHETERIZATION WITH CORONARY ANGIOGRAM;  Surgeon: Thayer Headings, MD;  Location: [facility] CATH LAB;  Service: Cardiovascular;  Laterality: N/A;  . MASS EXCISION N/A 08/23/2016   Procedure: EXCISION BASAL CELL CARCINOMA ON BACK OVER 4 CM;  Surgeon: Aviva Signs, MD;  Location: [facility] ORS;  Service: General;  Laterality: N/A;  . PORT-A-CATH REMOVAL    . PORTACATH PLACEMENT    . SKIN GRAFT SPLIT THICKNESS TRUNK    01/23/2010    Right chest;  Felicie Morn, M.D.  . VASECTOMY      Family History:  Family History  Problem Relation Age of Onset  . Diabetes Mother   . Deep vein thrombosis Mother   . Pulmonary embolism Mother   . Cancer Father        hodgins lymphoma  . Diabetes Sister     Social History:  reports that he has been smoking cigarettes.  He has a 8.75 pack-year smoking history. He quit smokeless tobacco use about 10 years ago. His smokeless tobacco  use included chew. He reports that he drinks about 1.0 - 1.5 oz of alcohol per week. He reports that he does not use drugs.  Additional Social History:  Alcohol / Drug Use Pain Medications: see PTA meds Prescriptions: see PTA meds Over the Counter: see PTA meds History of alcohol / drug use?: No history of alcohol / drug abuse  CIWA: CIWA-Ar BP: 99/65 Pulse Rate: 71 COWS:    PATIENT STRENGTHS: (choose at least two) Average or above average intelligence Capable of independent living Communication skills Motivation for treatment/growth  Allergies:  Allergies  Allergen Reactions  . Ketorolac Diarrhea  . Advil [Ibuprofen] Itching  . Dilaudid [Hydromorphone] Nausea And Vomiting     Tolerates IV formulation--unable to tolerate pill form  . Mobic [Meloxicam] Other (See Comments)    Severe headaches.  . Naproxen Nausea Only  . Tramadol Hives and Itching  . Vicodin [Hydrocodone-Acetaminophen] Hives  . Cimetidine Other (See Comments) and Rash    headaches Severe headache  . Gabapentin Anxiety    "Neck twitches"  . Tape Rash    Paper tape    Home Medications:  Medications Prior to Admission  Medication Sig Dispense Refill  . albuterol (PROVENTIL) (2.5 MG/3ML) 0.083% nebulizer solution Take 3 mLs (2.5 mg total) by nebulization every 6 (six) hours as needed for wheezing or shortness of breath. 75 mL 3  . Albuterol Sulfate 108 (90 Base) MCG/ACT AEPB Inhale 1-2 puffs into the lungs daily as needed (for shortness of breath/wheezing).     Marland Kitchen aspirin EC 325 MG tablet Take 325 mg by mouth daily.    . budesonide (PULMICORT) 0.5 MG/2ML nebulizer solution Take 0.5 mg by nebulization daily as needed for shortness of breath/wheezing    . butalbital-acetaminophen-caffeine (FIORICET, ESGIC) 50-325-40 MG tablet Take 1 tablet by mouth 3 (three) times daily as needed for headache or migraine. 14 tablet 0  . Cinnamon 500 MG capsule Take 1,000 mg by mouth daily.     . clonazePAM (KLONOPIN) 1 MG tablet Take 1 mg by mouth 2 (two) times daily as needed for anxiety.    . clopidogrel (PLAVIX) 75 MG tablet Take 1 tablet (75 mg total) by mouth daily. 90 tablet 0  . DULoxetine (CYMBALTA) 60 MG capsule Take 1 capsule (60 mg total) by mouth daily. 90 capsule 1  . fluticasone (FLONASE) 50 MCG/ACT nasal spray Place 2 sprays into both nostrils daily as needed for allergies or rhinitis.     Marland Kitchen glimepiride (AMARYL) 4 MG tablet Take 4 mg by mouth daily with breakfast.    . isosorbide mononitrate (IMDUR) 60 MG 24 hr tablet Take 90 mg by mouth daily.    Marland Kitchen lisinopril (PRINIVIL,ZESTRIL) 10 MG tablet Take 10 mg by mouth daily.     . metFORMIN (GLUCOPHAGE) 1000 MG tablet Take 1,000 mg by mouth 2 (two) times  daily.     . metoprolol succinate (TOPROL-XL) 50 MG 24 hr tablet Take 50 mg by mouth daily.  1  . nitroGLYCERIN (NITROSTAT) 0.4 MG SL tablet Place 0.4 mg under the tongue every 5 (five) minutes as needed for chest pain.    . pantoprazole (PROTONIX) 40 MG tablet Take 1 tablet (40 mg total) by mouth daily. 90 tablet 1  . promethazine (PHENERGAN) 25 MG tablet Take 1 tablet (25 mg total) by mouth 2 (two) times daily as needed for nausea or vomiting. 20 tablet 0  . rosuvastatin (CRESTOR) 10 MG tablet Take 1 tablet (10 mg total) by mouth daily. [place]  tablet 1  . sitaGLIPtin (JANUVIA) 100 MG tablet Take 100 mg by mouth daily.    . tamsulosin (FLOMAX) 0.4 MG CAPS capsule Take 1 capsule (0.4 mg total) by mouth daily. 90 capsule 3  . zolpidem (AMBIEN) 10 MG tablet Take 10 mg by mouth at bedtime as needed for sleep. for sleep  1    OB/GYN Status:  No LMP for male patient.  General Assessment Data Location of Assessment: [facility] TTS Assessment: In system Is this a Tele or Face-to-Face Assessment?: Tele Assessment Is this an Initial Assessment or a Re-assessment for this encounter?: Initial Assessment Marital status: Separated Living Arrangements: Parent Can pt return to current living arrangement?: Yes Admission Status: Voluntary Is patient capable of signing voluntary admission?: Yes Referral Source: Self/Family/Friend Insurance type: [facility]     Crisis Care Plan Living Arrangements: Parent Name of Psychiatrist: none Name of Therapist: none  Education Status Is patient currently in school?: No  Risk to self with the past 6 months Suicidal Ideation: Yes-Currently Present Has patient been a risk to self within the past 6 months prior to admission? : No Suicidal Intent: No Has patient had any suicidal intent within the past 6 months prior to admission? : Yes Is patient at risk for suicide?: Yes Suicidal Plan?: No Has patient had any suicidal plan within the past 6 months  prior to admission? : No Access to Means: No Previous Attempts/Gestures: No Intentional Self Injurious Behavior: None Family Suicide History: Unknown Recent stressful life event(s): Other (Comment) Persecutory voices/beliefs?: Yes Depression: Yes Depression Symptoms: Feeling worthless/self pity, Loss of interest in usual pleasures Substance abuse history and/or treatment for substance abuse?: No Suicide prevention information given to non-admitted patients: Not applicable  Risk to Others within the past 6 months Homicidal Ideation: No Does patient have any lifetime risk of violence toward others beyond the six months prior to admission? : Unknown Thoughts of Harm to Others: No Current Homicidal Intent: No Current Homicidal Plan: No Access to Homicidal Means: No History of harm to others?: No Assessment of Violence: None Noted Does patient have access to weapons?: No Criminal Charges Pending?: No Does patient have a court date: No Is patient on probation?: No  Psychosis Hallucinations: Auditory Delusions: None noted  Mental Status Report Appearance/Hygiene: Unremarkable Eye Contact: Fair Motor Activity: Unremarkable Speech: Logical/coherent Level of Consciousness: Alert Mood: Sad, Pleasant Affect: Appropriate to circumstance Anxiety Level: Minimal Thought Processes: Coherent, Relevant Judgement: Partial Orientation: Person, Place, Time, Situation Obsessive Compulsive Thoughts/Behaviors: None  Cognitive Functioning Concentration: Normal Memory: Recent Intact, Remote Intact IQ: Average Insight: Fair Impulse Control: Fair Appetite: Fair Sleep: No Change Vegetative Symptoms: None  ADLScreening [facility] Assessment Services) Patient's cognitive ability adequate to safely complete daily activities?: Yes Patient able to express need for assistance with ADLs?: Yes Independently performs ADLs?: Yes (appropriate for developmental age)  Prior Inpatient Therapy Prior  Inpatient Therapy: No  Prior Outpatient Therapy Prior Outpatient Therapy: No Does patient have an ACCT team?: No Does patient have Intensive In-House Services?  : No Does patient have [facility] services? : No Does patient have P4CC services?: No  ADL Screening (condition at time of admission) Patient's cognitive ability adequate to safely complete daily activities?: Yes Is the patient deaf or have difficulty hearing?: No Does the patient have difficulty seeing, even when wearing glasses/contacts?: No Does the patient have difficulty concentrating, remembering, or making decisions?: No Patient able to express need for assistance with ADLs?: Yes Does the patient have difficulty dressing  or bathing?: No Independently performs ADLs?: Yes (appropriate for developmental age) Does the patient have difficulty walking or climbing stairs?: No Weakness of Legs: None Weakness of Arms/Hands: None  Home Assistive Devices/Equipment Home Assistive Devices/Equipment: Blood pressure cuff, CBG Meter, Cane (specify quad or straight), Walker (specify type), Eyeglasses, Grab bars in shower, Grab bars around toilet, Scales, Reacher(Straight cane, Front-wheel walker,)  Therapy Consults (therapy consults require a physician order) PT Evaluation Needed: No OT Evalulation Needed: No SLP Evaluation Needed: No Abuse/Neglect Assessment (Assessment to be complete while patient is alone) Abuse/Neglect Assessment Can Be Completed: Yes Physical Abuse: Denies Verbal Abuse: Denies Sexual Abuse: Denies Exploitation of patient/patient's resources: Denies Self-Neglect: Denies Values / Beliefs Cultural Requests During Hospitalization: None Spiritual Requests During Hospitalization: None Consults Spiritual Care Consult Needed: Yes (Comment) Social Work Consult Needed: No Regulatory affairs officer (For Healthcare) Does Patient Have a Catering manager?: No Would patient like information on creating a medical advance  directive?: No - Patient declined Nutrition Screen- [facility] Adult/[facility] Patient's home diet: Regular Has the patient recently lost weight without trying?: No Has the patient been eating poorly because of a decreased appetite?: No Malnutrition Screening Tool Score: 0  Additional Information 1:1 In Past 12 Months?: No CIRT Risk: No Elopement Risk: No Does patient have medical clearance?: No     Disposition:  Disposition Initial Assessment Completed for this Encounter: Yes(consulted with Shuvon Rankin, NP) Disposition of Patient: Inpatient treatment program Type of inpatient treatment program: Adult  This service was provided via telemedicine using a 2-way, interactive audio and Radiographer, therapeutic.  Names of all persons participating in this telemedicine service and their role in this encounter.   Rexene Edison 04/17/2017 2:07 PM

## 2017-04-17 NOTE — Progress Notes (Signed)
TTS in Process

## 2017-04-17 NOTE — Consult Note (Signed)
Cardiology Consult    Patient ID: Derek Chen; [id number]; 1960/05/28   Admit date: 04/16/2017 Date of Consult: 04/17/2017  Primary Care Provider: Kennyth Lose, PA-C Primary Cardiologist: [facility]; Scheduled to establish care with Dr. Bronson Ing later this month  Patient Profile    Derek Chen is a 57 y.o. male with past medical history of CAD (s/p DES to LAD in 2011 with patent stent by cath in 2013, chronic chest pain since as documented by prior notes), ischemic cardiomyopathy (EF previously 35-40% by echo in 2017), HTN, HLD, Type 2 DM anxiety, and substance abuse who is being seen today for the evaluation of chest pain at the request of Dr. Olevia Bowens.   History of Present Illness    Derek Chen was last examined by Dr. Harl Bowie in 10/2015 during an admission for chest pain. He reported constant pain for the past week which was worse with positional changes or breathing. Cardiac enzymes were negative and EKG showed no acute ischemic changes. He was to follow-up with Cardiology in the outpatient setting for further evaluation.   He presented to [facility] ED on 04/16/2016 for evaluation of suicidal thoughts which had been occurring for the past week with associated hallucinations. En route to the hospital, he also reported having chest pain.   In talking with the patient today, he reports having episodes of chest pain for the past several months. This can last for 30 minutes up to several days with associated dyspnea. He denies any nausea, vomiting, or diaphoresis. His pain is worse with positional changes and is reproducible on palpation. No association with exertion. His current episode of chest pain has been present for 2 days and is worse with deep breathing.   Initial labs show WBC of 10.3, Hgb 15.3, platelets 247, Na+ 138, K+ 4.0, and creatinine 0.82. UDS negative. Initial and cyclic troponin values have been negative. CXR shows chronic bronchitic changes with no acute findings. EKG  shows sinus bradycardia, HR 58, with known RBBB.    Past Medical History:  Diagnosis Date  . Anxiety   . Arthritis   . Basal cell cancer    Chest and back - status post skin grafting  . BPH (benign prostatic hyperplasia)   . Cardiomyopathy (HCC)    LVEF 45-50%  . COPD (chronic obstructive pulmonary disease) ([place])   . Coronary artery disease    NSTEMI - DES mid LAD December 2011 (patent December 2013)  . Dyslipidemia   . Essential hypertension, benign   . GERD (gastroesophageal reflux disease)   . Hepatitis B antibody positive 2006  . History of kidney stones   . MI (myocardial infarction) ([place])    3 MI's last in 2016.  Marland Kitchen PONV (postoperative nausea and vomiting)    pt states that zofran and scope patch did not help PONV after last surgery.  . Sleep apnea   . Type 2 diabetes mellitus ([place]) 2012  . Viral pneumonia, unspecified 2004    Past Surgical History:  Procedure Laterality Date  . APPENDECTOMY    . COLONOSCOPY N/A 08/10/2015   Procedure: COLONOSCOPY;  Surgeon: Rogene Houston, MD;  Location: [facility] ENDO SUITE;  Service: Endoscopy;  Laterality: N/A;  730  . CORONARY STENT PLACEMENT    . FINGER SURGERY Right    index, gnagrene in finger.  Marland Kitchen KNEE ARTHROSCOPY     Left  . LEFT HEART CATHETERIZATION WITH CORONARY ANGIOGRAM N/A 03/20/2012   Procedure: LEFT HEART CATHETERIZATION WITH CORONARY ANGIOGRAM;  Surgeon: Thayer Headings, MD;  Location: [facility] CATH LAB;  Service: Cardiovascular;  Laterality: N/A;  . MASS EXCISION N/A 08/23/2016   Procedure: EXCISION BASAL CELL CARCINOMA ON BACK OVER 4 CM;  Surgeon: Aviva Signs, MD;  Location: [facility] ORS;  Service: General;  Laterality: N/A;  . PORT-A-CATH REMOVAL    . PORTACATH PLACEMENT    . SKIN GRAFT SPLIT THICKNESS TRUNK    01/23/2010    Right chest;  Felicie Morn, M.D.  . VASECTOMY       Home Medications:  Prior to Admission medications   Medication Sig Start Date End Date Taking? Authorizing Provider  albuterol (PROVENTIL) (2.5  MG/3ML) 0.083% nebulizer solution Take 3 mLs (2.5 mg total) by nebulization every 6 (six) hours as needed for wheezing or shortness of breath. 11/10/13  Yes Martin, Mary-Margaret, FNP  Albuterol Sulfate 108 (90 Base) MCG/ACT AEPB Inhale 1-2 puffs into the lungs daily as needed (for shortness of breath/wheezing).  10/04/15  Yes [provider]  aspirin EC 325 MG tablet Take 325 mg by mouth daily.   Yes [provider]  budesonide (PULMICORT) 0.5 MG/2ML nebulizer solution Take 0.5 mg by nebulization daily as needed for shortness of breath/wheezing   Yes [provider]  butalbital-acetaminophen-caffeine (FIORICET, ESGIC) 50-325-40 MG tablet Take 1 tablet by mouth 3 (three) times daily as needed for headache or migraine. 11/19/15  Yes Hosie Poisson, MD  Cinnamon 500 MG capsule Take 1,000 mg by mouth daily.    Yes [provider]  clonazePAM (KLONOPIN) 1 MG tablet Take 1 mg by mouth 2 (two) times daily as needed for anxiety.   Yes [provider]  clopidogrel (PLAVIX) 75 MG tablet Take 1 tablet (75 mg total) by mouth daily. 08/15/15  Yes Rehman, Mechele Dawley, MD  DULoxetine (CYMBALTA) 60 MG capsule Take 1 capsule (60 mg total) by mouth daily. 03/17/14  Yes Martin, Mary-Margaret, FNP  fluticasone (FLONASE) 50 MCG/ACT nasal spray Place 2 sprays into both nostrils daily as needed for allergies or rhinitis.  08/09/13  Yes Martin, Mary-Margaret, FNP  glimepiride (AMARYL) 4 MG tablet Take 4 mg by mouth daily with breakfast.   Yes [provider]  isosorbide mononitrate (IMDUR) 60 MG 24 hr tablet Take 90 mg by mouth daily. 09/06/15  Yes [provider]  lisinopril (PRINIVIL,ZESTRIL) 10 MG tablet Take 10 mg by mouth daily.  09/18/15  Yes [provider]  metFORMIN (GLUCOPHAGE) 1000 MG tablet Take 1,000 mg by mouth 2 (two) times daily.  03/22/12  Yes [provider]  metoprolol succinate (TOPROL-XL) 50 MG 24 hr tablet Take 50 mg by mouth daily.  10/10/15  Yes [provider]  nitroGLYCERIN (NITROSTAT) 0.4 MG SL tablet Place 0.4 mg under the tongue every 5 (five) minutes as needed for chest pain.   Yes [provider]  pantoprazole (PROTONIX) 40 MG tablet Take 1 tablet (40 mg total) by mouth daily. 11/10/13  Yes Hassell Done, Mary-Margaret, FNP  promethazine (PHENERGAN) 25 MG tablet Take 1 tablet (25 mg total) by mouth 2 (two) times daily as needed for nausea or vomiting. 03/16/17  Yes Rehman, Mechele Dawley, MD  rosuvastatin (CRESTOR) 10 MG tablet Take 1 tablet (10 mg total) by mouth daily. 11/10/13  Yes Martin, Mary-Margaret, FNP  sitaGLIPtin (JANUVIA) 100 MG tablet Take 100 mg by mouth daily. 10/23/15  Yes [provider]  tamsulosin (FLOMAX) 0.4 MG CAPS capsule Take 1 capsule (0.4 mg total) by mouth daily. 03/17/14  Yes Chevis Pretty, FNP  zolpidem (AMBIEN) 10 MG tablet Take 10 mg by mouth at bedtime as needed for sleep. for sleep 11/28/15  Yes [provider]    Inpatient Medications: Scheduled Meds: . aspirin EC  325 mg Oral Daily  . budesonide  0.5 mg Nebulization BID  . clopidogrel  75 mg Oral Daily  . DULoxetine  60 mg Oral Daily  . enoxaparin (LOVENOX) injection  40 mg Subcutaneous QHS  . glimepiride  4 mg Oral Q breakfast  . insulin aspart  0-9 Units Subcutaneous TID WC  . isosorbide mononitrate  90 mg Oral Daily  . linagliptin  5 mg Oral Daily  . lisinopril  10 mg Oral Daily  . metFORMIN  1,000 mg Oral BID WC  . metoprolol succinate  50 mg Oral Daily  . nicotine  21 mg Transdermal Once  . nicotine  21 mg Transdermal Daily  . pantoprazole  40 mg Oral Daily  . rosuvastatin  10 mg Oral Daily  . tamsulosin  0.4 mg Oral Daily   Continuous Infusions:  PRN Meds: acetaminophen, albuterol, butalbital-acetaminophen-caffeine, clonazePAM, fluticasone, nitroGLYCERIN, ondansetron (ZOFRAN) IV, promethazine, zolpidem  Allergies:    Allergies  Allergen Reactions  . Ketorolac Diarrhea  . Advil  [Ibuprofen] Itching  . Dilaudid [Hydromorphone] Nausea And Vomiting    Tolerates IV formulation--unable to tolerate pill form  . Mobic [Meloxicam] Other (See Comments)    Severe headaches.  . Naproxen Nausea Only  . Tramadol Hives and Itching  . Vicodin [Hydrocodone-Acetaminophen] Hives  . Cimetidine Other (See Comments) and Rash    headaches Severe headache  . Gabapentin Anxiety    "Neck twitches"  . Tape Rash    Paper tape    Social History:   Social History   Socioeconomic History  . Marital status: Married    Spouse name: Not on file  . Number of children: 2  . Years of education: Not on file  . Highest education level: Not on file  Social Needs  . Financial resource strain: Not on file  . Food insecurity - worry: Not on file  . Food insecurity - inability: Not on file  . Transportation needs - medical: Not on file  . Transportation needs - non-medical: Not on file  Occupational History  . Occupation: IT trainer    Comment: [place]  Tobacco Use  . Smoking status: Current Every Day Smoker    Packs/day: 0.25    Years: 35.00    Pack years: 8.75    Types: Cigarettes  . Smokeless tobacco: Former Systems developer    Types: [place] date: 08/22/2006  Substance and Sexual Activity  . Alcohol use: Yes    Alcohol/week: 1.0 - 1.5 oz    Types: 2 - 3 Standard drinks or equivalent per week    Comment: Drinks occasional beer and wine  . Drug use: No  . Sexual activity: Yes    Birth control/protection: None  Other Topics Concern  . Not on file  Social History Narrative  . Not on file     Family History:    Family History  Problem Relation Age of Onset  . Diabetes Mother   . Deep vein thrombosis Mother   . Pulmonary embolism Mother   . Cancer Father        hodgins lymphoma  . Diabetes Sister       Review of Systems    General:  No chills, fever, night sweats or weight changes.  Cardiovascular:  No  dyspnea on exertion, edema, orthopnea,  palpitations, paroxysmal nocturnal dyspnea. Positive for chest pain.  Psych: Positive for suicidal ideations.  Dermatological: No rash, lesions/masses Respiratory: No cough, dyspnea Urologic: No hematuria, dysuria Abdominal:   No nausea, vomiting, diarrhea, bright red blood per rectum, melena, or hematemesis Neurologic:  No visual changes, wkns, changes in mental status. All other systems reviewed and are otherwise negative except as noted above.  Physical Exam/Data    Vitals:   04/16/17 2000 04/16/17 2100 04/17/17 0300 04/17/17 0548  BP: 126/72 107/74 (!) 105/59 117/69  Pulse:   (!) 56 (!) 58  Resp:   18 15  Temp:      TempSrc:      SpO2:   96% 96%  Weight:      Height:        Intake/Output Summary (Last 24 hours) at 04/17/2017 0740 Last data filed at 04/17/2017 0200 Gross per 24 hour  Intake 840 ml  Output -  Net 840 ml   Filed Weights   04/16/17 1606  Weight: 157 lb (71.2 kg)   Body mass index is 21.9 kg/m.   General: Pleasant Caucasian male appearing in NAD Psych: Normal affect. Neuro: Alert and oriented X 3. Moves all extremities spontaneously. HEENT: Normal  Neck: Supple without bruits or JVD. Lungs:  Resp regular and unlabored, CTA without wheezing or rales. Heart: RRR no s3, s4, or murmurs. Tender to palpation along precordium.  Abdomen: Soft, non-tender, non-distended, BS + x 4.  Extremities: No clubbing, cyanosis or edema. DP/PT/Radials 2+ and equal bilaterally.   EKG:  The EKG was personally reviewed and demonstrates: Sinus bradycardia, HR 58, with known RBBB.   Telemetry:  Telemetry was personally reviewed and demonstrates:  Sinus bradycardia, HR in mid-50's to 60's with occasional PVC's.    Labs/Studies     Relevant CV Studies:  Echocardiogram: 08/2015 Study Conclusions  - Left ventricle: The cavity size was normal. Systolic function was   moderately reduced. The estimated ejection fraction was in the   range of 35% to 40%. Hypokinesis of the  lateral and inferior   myocardium.  Cardiac Catheterization: 03/2012 Hemodynamics:   LV pressure: 127/7 Aortic pressure: 125/9  Angiography   Left Main: minor luminal irregularities  Left anterior Descending: diffuse mild disease.  There is a long stent in the proximal LAD.  There is mild irregularities within the stent.   No significant changes from previous tracings.  Left Circumflex: moderate sized branch, 1st OM moderate irregularities.   Right Coronary Artery: large and dominant, minor luminal irregularities.  PDA and PLSA are unremarkable.    LV Gram: LAO and RAO views.   mildly reduced LV function - EF 40-45%.  There is akinesis of a small-medium sized area of the  anterior lateral wall.  This segmental wall motion abnormality was present at the time of his previous cath.  Complications: No apparent complications Patient did tolerate procedure well.  Contrast used: 125  Conclusions:  Moderate CAD.  The LAD stent is patent.  His coronary arteries are unchanged from his previous cath in 2011.   Laboratory Data:  Chemistry Recent Labs  Lab 04/16/17 1559  NA 138  K 4.0  CL 102  CO2 26  GLUCOSE 158*  BUN 11  CREATININE 0.82  CALCIUM 9.2  GFRNONAA >60  GFRAA >60  ANIONGAP 10    Recent Labs  Lab 04/16/17 1559  PROT 7.4  ALBUMIN 4.0  AST 14*  ALT 19  ALKPHOS 69  BILITOT  0.7   Hematology Recent Labs  Lab 04/16/17 1559  WBC 10.3  RBC 5.03  HGB 15.3  HCT 45.7  MCV 90.9  MCH 30.4  MCHC 33.5  RDW 13.2  PLT 247   Cardiac Enzymes Recent Labs  Lab 04/16/17 1859 04/17/17 0052  TROPONINI <0.03 <0.03    Recent Labs  Lab 04/16/17 1615  TROPIPOC 0.00    BNPNo results for input(s): BNP, PROBNP in the last 168 hours.  DDimer No results for input(s): DDIMER in the last 168 hours.  Radiology/Studies:  Dg Chest 2 View  Result Date: 04/16/2017 CLINICAL DATA:  Chest pain. EXAM: CHEST  2 VIEW COMPARISON:  Chest x-ray dated December 03, 2015.  FINDINGS: The heart size and mediastinal contours are within normal limits. Normal pulmonary vascularity. Coarsened interstitial markings are similar to prior study. No focal consolidation, pleural effusion, or pneumothorax. No acute osseous abnormality. IMPRESSION: Chronic bronchitic changes.  No active cardiopulmonary disease. Electronically Signed   By: Titus Dubin M.D.   On: 04/16/2017 16:39     Assessment & Plan    1. Atypical Chest Pain - the patient reports having episodes of chest pain for the past several months which can last for 30 minutes up to several days. Pain is worse with positional changes and deep breathing. No association with exertion. Pain is reproducible on palpation.  - Initial and cyclic troponin values have been negative. EKG shows sinus bradycardia, HR 58, with known RBBB.  - overall, his symptoms seem atypical for a cardiac etiology and most consistent with pleuritic and MSK pain. Has been intolerant to NSAIDS in the past. Could consider topical Voltaren gel. Would not further titrate Imdur given his soft BP and the pain not improving with SL NTG. Echo is pending to assess LV function and wall motion. If EF remains reduced, can consider outpatient NST but would not pursue further inpatient ischemic evaluation at this time.   2. CAD - s/p DES to LAD in 2011 with patent stent by cath in 2637. - cyclic troponin values have been negative. Repeat echo is pending.  - continue ASA (will reduce dosing from 325mg  daily to 81mg  daily), Plavix, BB, Imdur, and statin therapy.   3. Ischemic Cardiomyopathy - EF reduced to 35-40% by echo in 08/2015. - he does not appear volume overloaded by physical examination. Plan for repeat echocardiogram later today.  - continue Toprol-XL and Lisinopril.   4. HTN - BP has been well-controlled ay 101/59 - 133/82 within the past 24 hours.  - continue current medication regimen.   5. HLD - no recent FLP on file. Goal LDL is < 70 with  known CAD.  - remains on Crestor 10mg  daily.   6. Suicidal Ideations - Psychiatry has been consulted by the admitting team. Sitter at the bedside.     For questions or updates, please contact [place] Please consult [email or web address] for contact info under Cardiology/STEMI.  Signed, Erma Heritage, PA-C 04/17/2017, 7:40 AM Pager: [phone]  The patient was seen and examined, and I agree with the history, physical exam, assessment and plan as documented above, with modifications as noted below. I have also personally reviewed all relevant documentation, old records, labs, and both radiographic and cardiovascular studies. I have also independently interpreted old and new ECG's.  Briefly, this is a 57 year old male with a history of chest pain which has been worse in the past week and notably exacerbated with positional changes and deep inspiration.  He presented to the  [facility] emergency room for evaluation of suicidal thoughts and hallucinations.  He tells me he wanted to go to [facility] and be admitted in the inpatient psychiatric unit.  Because he had chest pain and shortness of breath and an elevated heart rate, he was brought here.  He said for the past 6 weeks he has been waking up with shortness of breath and diaphoresis.  He has ruled out for an acute coronary syndrome with normal troponins.  Chest x-ray shows chronic bronchitic changes no acute pulmonary edema. ECG upon admission which I personally interpreted demonstrates sinus rhythm with a right bundle branch block.  There are no acute ischemic changes.  He has an ischemic cardiomyopathy with LVEF of 35-40% with hypokinesis in the lateral and inferior walls seen by echocardiography in June 2017.  Clinically, he is not in decompensated heart failure.  I reviewed his most recent coronary angiographic from December 2013 which showed patent proximal LAD stent with mild irregularities within the stent  itself.  There were no significant obstructive lesions in the remaining epicardial vessels.  On physical exam, his left side chest wall is tender to palpation and the patient says this is what his previous pains felt like.  His chest pain is very atypical for a cardiac etiology.  There are both musculoskeletal and pleuritic components.  The pain has not been nitrate responsive either.  An echocardiogram has been ordered which I will review.  He is on aspirin 325 mill grams daily so we will reduce to 81 mg daily.  I see no indication for Plavix so this will be discontinued as well.  He will remain on metoprolol succinate, Imdur, and statin therapy.  He also requested that I prescribe Fioricet and clonazepam but I declined.  No further recommendations at this time.  I will sign off inpatient care.  We will make certain he has follow-up in our office.   Kate Sable, MD, [facility]  04/17/2017 10:30 AM

## 2017-04-17 NOTE — Progress Notes (Signed)
PROGRESS NOTE  Derek Chen ZOX:[id number] DOB: Feb 12, 1961 DOA: 04/16/2017 PCP: Kennyth Lose, PA-C  Brief History:  57 year old male with a history of hypertension, hyperlipidemia, diabetes mellitus type 2, depression, cardiomyopathy, coronary artery disease presenting with 1 day history of chest pain that he describes as sharp and constant located substernally.  He describes associated shortness of breath and diaphoresis.  EMS was initially activated because the patient has suicidal ideations.  He stated he was having dreams that he is about to get killed by someone.  The patient was afebrile hemodynamically stable saturating 98% on room air.  Cardiology was consulted to assist.  Assessment/Plan: Atypical chest pain -Troponins negative x3 -Personally reviewed EKG--sinus rhythm, RBBB-unchanged -Appreciate cardiology consult -Echocardiogram -personally reviewed CXR--no infiltrates or edema  Suicidal ideation -Psychiatry consult -Continue home dose Cymbalta -continue one-on-one sitter  Opioid/benzodiazepine seeking behavior -Patient stated that he takes butalbital and Klonopin 3 times daily -The [place] Controlled Substance Reporting System has been queried for this patient--No controlled substances have been filled since 09/04/16 as pt claims  CAD - s/p DES to LAD in 2011 with patent stent by cath in 8119. - cyclic troponin values have been negative. Repeat echo is pending.  - continue ASA 81 mg, Plavix, BB, Imdur, and statin therapy  Ischemic Cardiomyopathy - EF reduced to 35-40% by echo in 08/2015. - clinically euvolemic -continue metoprolol succinate and lisinopril   Tobacco abuse/COPD -NicoDerm patch -Stable on room air  Diabetes mellitus type 2 -check A1C -novolog sliding scale -holding amaryl and metformin  Essential hypertension -Continue Imdur and lisinopril and metoprolol succinate  HLD -continue statin   Disposition Plan:   Home when  cleared by cardiology and psychiatry  Family Communication:   No Family at bedside--Total time spent 35 minutes.  Greater than 50% spent face to face counseling and coordinating care.   Consultants:  Cardiology, psychiatry  Code Status:  FULL  DVT Prophylaxis:  Roff Lovenox   Procedures: As Listed in Progress Note Above  Antibiotics: None    Subjective: Patient continues to have substernal chest pain.  He complains of some dyspnea on exertion.  He denies any nausea, vomiting, diarrhea, abdominal pain, dysuria, hematuria.  He complains of anxiety.  Objective: Vitals:   04/16/17 2100 04/17/17 0300 04/17/17 0548 04/17/17 0855  BP: 107/74 (!) 105/59 117/69 116/73  Pulse:  (!) 56 (!) 58 67  Resp:  18 15 12   Temp:    98.8 F (37.1 C)  TempSrc:    Oral  SpO2:  96% 96% 98%  Weight:      Height:        Intake/Output Summary (Last 24 hours) at 04/17/2017 0956 Last data filed at 04/17/2017 1478 Gross per 24 hour  Intake 1440 ml  Output -  Net 1440 ml   Weight change:  Exam:   General:  Pt is alert, follows commands appropriately, not in acute distress  HEENT: No icterus, No thrush, No neck mass, [place]/AT  Cardiovascular: RRR, S1/S2, no rubs, no gallops  Respiratory: CTA bilaterally, no wheezing, no crackles, no rhonchi  Abdomen: Soft/+BS, non tender, non distended, no guarding  Extremities: No edema, No lymphangitis, No petechiae, No rashes, no synovitis   Data Reviewed: I have personally reviewed following labs and imaging studies Basic Metabolic Panel: Recent Labs  Lab 04/16/17 1559  NA 138  K 4.0  CL 102  CO2 26  GLUCOSE 158*  BUN 11  CREATININE  0.82  CALCIUM 9.2   Liver Function Tests: Recent Labs  Lab 04/16/17 1559  AST 14*  ALT 19  ALKPHOS 69  BILITOT 0.7  PROT 7.4  ALBUMIN 4.0   No results for input(s): LIPASE, AMYLASE in the last 168 hours. No results for input(s): AMMONIA in the last 168 hours. Coagulation Profile: No results for  input(s): INR, PROTIME in the last 168 hours. CBC: Recent Labs  Lab 04/16/17 1559  WBC 10.3  NEUTROABS 6.9  HGB 15.3  HCT 45.7  MCV 90.9  PLT 247   Cardiac Enzymes: Recent Labs  Lab 04/16/17 1859 04/17/17 0052 04/17/17 0703  TROPONINI <0.03 <0.03 <0.03   BNP: Invalid input(s): POCBNP CBG: Recent Labs  Lab 04/16/17 2257 04/17/17 0816  GLUCAP 187* 237*   HbA1C: No results for input(s): HGBA1C in the last 72 hours. Urine analysis:    Component Value Date/Time   COLORURINE YELLOW 03/18/2012 0402   APPEARANCEUR CLEAR 03/18/2012 0402   LABSPEC >1.030 (H) 03/18/2012 0402   PHURINE 5.5 03/18/2012 0402   GLUCOSEU NEGATIVE 03/18/2012 0402   HGBUR NEGATIVE 03/18/2012 0402   BILIRUBINUR NEGATIVE 03/18/2012 0402   KETONESUR NEGATIVE 03/18/2012 0402   PROTEINUR NEGATIVE 03/18/2012 0402   UROBILINOGEN 0.2 03/18/2012 0402   NITRITE NEGATIVE 03/18/2012 0402   LEUKOCYTESUR NEGATIVE 03/18/2012 0402   Sepsis Labs: @LABRCNTIP (procalcitonin:4,lacticidven:4) )No results found for this or any previous visit (from the past 240 hour(s)).   Scheduled Meds: . aspirin EC  81 mg Oral Daily  . budesonide  0.5 mg Nebulization BID  . clopidogrel  75 mg Oral Daily  . DULoxetine  60 mg Oral Daily  . enoxaparin (LOVENOX) injection  40 mg Subcutaneous QHS  . glimepiride  4 mg Oral Q breakfast  . insulin aspart  0-9 Units Subcutaneous TID WC  . isosorbide mononitrate  90 mg Oral Daily  . linagliptin  5 mg Oral Daily  . lisinopril  10 mg Oral Daily  . metFORMIN  1,000 mg Oral BID WC  . metoprolol succinate  50 mg Oral Daily  . nicotine  21 mg Transdermal Once  . nicotine  21 mg Transdermal Daily  . pantoprazole  40 mg Oral Daily  . rosuvastatin  10 mg Oral Daily  . tamsulosin  0.4 mg Oral Daily   Continuous Infusions:  Procedures/Studies: Dg Chest 2 View  Result Date: 04/16/2017 CLINICAL DATA:  Chest pain. EXAM: CHEST  2 VIEW COMPARISON:  Chest x-ray dated December 03, 2015.  FINDINGS: The heart size and mediastinal contours are within normal limits. Normal pulmonary vascularity. Coarsened interstitial markings are similar to prior study. No focal consolidation, pleural effusion, or pneumothorax. No acute osseous abnormality. IMPRESSION: Chronic bronchitic changes.  No active cardiopulmonary disease. Electronically Signed   By: Titus Dubin M.D.   On: 04/16/2017 16:39    Orson Eva, DO  [facility] Pager [phone]  If 7PM-7AM, please contact night-coverage [email or web address] Password [email or web address] 04/17/2017, 9:56 AM   LOS: 0 days

## 2017-04-17 NOTE — Care Management Obs Status (Signed)
[place] NOTIFICATION   Patient Details  Name: Derek Chen MRN: [id number] Date of Birth: [birth year]   Medicare Observation Status Notification Given:  Yes    Sherald Barge, RN 04/17/2017, 8:58 AM

## 2017-04-18 ENCOUNTER — Observation Stay (HOSPITAL_BASED_OUTPATIENT_CLINIC_OR_DEPARTMENT_OTHER): Payer: Medicare Other

## 2017-04-18 ENCOUNTER — Encounter (HOSPITAL_COMMUNITY): Payer: Self-pay | Admitting: *Deleted

## 2017-04-18 ENCOUNTER — Other Ambulatory Visit: Payer: Self-pay

## 2017-04-18 ENCOUNTER — Inpatient Hospital Stay (HOSPITAL_COMMUNITY)
Admission: AD | Admit: 2017-04-18 | Discharge: 2017-04-24 | DRG: 885 | Disposition: A | Discharge status: Home / Self Care | Payer: Medicare Other | Source: Intra-hospital | Attending: Psychiatry | Admitting: Psychiatry

## 2017-04-18 DIAGNOSIS — Z7984 Long term (current) use of oral hypoglycemic drugs: Secondary | ICD-10-CM | POA: Diagnosis not present

## 2017-04-18 DIAGNOSIS — Z736 Limitation of activities due to disability: Secondary | ICD-10-CM | POA: Diagnosis not present

## 2017-04-18 DIAGNOSIS — R45851 Suicidal ideations: Secondary | ICD-10-CM | POA: Diagnosis present

## 2017-04-18 DIAGNOSIS — Z85828 Personal history of other malignant neoplasm of skin: Secondary | ICD-10-CM | POA: Diagnosis not present

## 2017-04-18 DIAGNOSIS — F333 Major depressive disorder, recurrent, severe with psychotic symptoms: Principal | ICD-10-CM | POA: Diagnosis present

## 2017-04-18 DIAGNOSIS — Z7982 Long term (current) use of aspirin: Secondary | ICD-10-CM

## 2017-04-18 DIAGNOSIS — I429 Cardiomyopathy, unspecified: Secondary | ICD-10-CM | POA: Diagnosis present

## 2017-04-18 DIAGNOSIS — G43909 Migraine, unspecified, not intractable, without status migrainosus: Secondary | ICD-10-CM | POA: Diagnosis not present

## 2017-04-18 DIAGNOSIS — K219 Gastro-esophageal reflux disease without esophagitis: Secondary | ICD-10-CM | POA: Diagnosis present

## 2017-04-18 DIAGNOSIS — E119 Type 2 diabetes mellitus without complications: Secondary | ICD-10-CM | POA: Diagnosis present

## 2017-04-18 DIAGNOSIS — R0789 Other chest pain: Secondary | ICD-10-CM | POA: Diagnosis not present

## 2017-04-18 DIAGNOSIS — F17210 Nicotine dependence, cigarettes, uncomplicated: Secondary | ICD-10-CM | POA: Diagnosis present

## 2017-04-18 DIAGNOSIS — I252 Old myocardial infarction: Secondary | ICD-10-CM | POA: Diagnosis not present

## 2017-04-18 DIAGNOSIS — G44209 Tension-type headache, unspecified, not intractable: Secondary | ICD-10-CM | POA: Diagnosis present

## 2017-04-18 DIAGNOSIS — Z955 Presence of coronary angioplasty implant and graft: Secondary | ICD-10-CM | POA: Diagnosis not present

## 2017-04-18 DIAGNOSIS — R443 Hallucinations, unspecified: Secondary | ICD-10-CM | POA: Diagnosis not present

## 2017-04-18 DIAGNOSIS — F1099 Alcohol use, unspecified with unspecified alcohol-induced disorder: Secondary | ICD-10-CM | POA: Diagnosis not present

## 2017-04-18 DIAGNOSIS — I10 Essential (primary) hypertension: Secondary | ICD-10-CM | POA: Diagnosis present

## 2017-04-18 DIAGNOSIS — R079 Chest pain, unspecified: Secondary | ICD-10-CM

## 2017-04-18 DIAGNOSIS — Z79899 Other long term (current) drug therapy: Secondary | ICD-10-CM | POA: Diagnosis not present

## 2017-04-18 DIAGNOSIS — Z7902 Long term (current) use of antithrombotics/antiplatelets: Secondary | ICD-10-CM | POA: Diagnosis not present

## 2017-04-18 DIAGNOSIS — F4320 Adjustment disorder, unspecified: Secondary | ICD-10-CM | POA: Diagnosis not present

## 2017-04-18 DIAGNOSIS — F419 Anxiety disorder, unspecified: Secondary | ICD-10-CM | POA: Diagnosis present

## 2017-04-18 DIAGNOSIS — Z818 Family history of other mental and behavioral disorders: Secondary | ICD-10-CM | POA: Diagnosis not present

## 2017-04-18 DIAGNOSIS — R440 Auditory hallucinations: Secondary | ICD-10-CM | POA: Diagnosis not present

## 2017-04-18 DIAGNOSIS — I2510 Atherosclerotic heart disease of native coronary artery without angina pectoris: Secondary | ICD-10-CM | POA: Diagnosis present

## 2017-04-18 DIAGNOSIS — Z833 Family history of diabetes mellitus: Secondary | ICD-10-CM | POA: Diagnosis not present

## 2017-04-18 DIAGNOSIS — Z7951 Long term (current) use of inhaled steroids: Secondary | ICD-10-CM | POA: Diagnosis not present

## 2017-04-18 DIAGNOSIS — R450 Nervousness: Secondary | ICD-10-CM | POA: Diagnosis not present

## 2017-04-18 DIAGNOSIS — E785 Hyperlipidemia, unspecified: Secondary | ICD-10-CM | POA: Diagnosis present

## 2017-04-18 DIAGNOSIS — J449 Chronic obstructive pulmonary disease, unspecified: Secondary | ICD-10-CM | POA: Diagnosis present

## 2017-04-18 DIAGNOSIS — G4700 Insomnia, unspecified: Secondary | ICD-10-CM | POA: Diagnosis present

## 2017-04-18 LAB — GLUCOSE, CAPILLARY
GLUCOSE-CAPILLARY: 194 mg/dL — AB (ref 65–99)
Glucose-Capillary: 127 mg/dL — ABNORMAL HIGH (ref 65–99)

## 2017-04-18 LAB — ECHOCARDIOGRAM COMPLETE
Height: 71 in
Weight: 2512 oz

## 2017-04-18 LAB — HIV ANTIBODY (ROUTINE TESTING W REFLEX): HIV Screen 4th Generation wRfx: NONREACTIVE

## 2017-04-18 MED ORDER — LISINOPRIL 2.5 MG PO TABS: 2.5000 mg | ORAL_TABLET | Freq: Every day | ORAL | 0 refills | Status: DC

## 2017-04-18 MED ORDER — ASPIRIN 81 MG PO TBEC: 81.0000 mg | DELAYED_RELEASE_TABLET | Freq: Every day | ORAL | Status: DC

## 2017-04-18 MED ORDER — ALUM & MAG HYDROXIDE-SIMETH 200-200-20 MG/5ML PO SUSP: 30.0000 mL | ORAL | Status: DC | PRN

## 2017-04-18 MED ORDER — PANTOPRAZOLE SODIUM 40 MG PO TBEC
40.0000 mg | DELAYED_RELEASE_TABLET | Freq: Every day | ORAL | Status: DC
  Administered 2017-04-19 – 2017-04-24 (×6): 40 mg via ORAL
  Filled 2017-04-18 (×7): qty 1
  Filled 2017-04-18: qty 7
  Filled 2017-04-18: qty 1

## 2017-04-18 MED ORDER — LISINOPRIL 2.5 MG PO TABS
2.5000 mg | ORAL_TABLET | Freq: Every day | ORAL | Status: DC
  Administered 2017-04-19 – 2017-04-24 (×6): 2.5 mg via ORAL
  Filled 2017-04-18 (×2): qty 1
  Filled 2017-04-18: qty 7
  Filled 2017-04-18 (×5): qty 1

## 2017-04-18 MED ORDER — ROSUVASTATIN CALCIUM 5 MG PO TABS
10.0000 mg | ORAL_TABLET | Freq: Every day | ORAL | Status: DC
  Administered 2017-04-19 – 2017-04-24 (×6): 10 mg via ORAL
  Filled 2017-04-18: qty 2
  Filled 2017-04-18: qty 1
  Filled 2017-04-18: qty 14
  Filled 2017-04-18 (×4): qty 2
  Filled 2017-04-18 (×2): qty 1

## 2017-04-18 MED ORDER — ISOSORBIDE MONONITRATE ER 60 MG PO TB24: 60.0000 mg | ORAL_TABLET | Freq: Every day | ORAL | 0 refills | Status: DC

## 2017-04-18 MED ORDER — ISOSORBIDE MONONITRATE ER 30 MG PO TB24
60.0000 mg | ORAL_TABLET | Freq: Every day | ORAL | Status: DC
  Administered 2017-04-19 – 2017-04-24 (×6): 60 mg via ORAL
  Filled 2017-04-18: qty 1
  Filled 2017-04-18 (×2): qty 2
  Filled 2017-04-18: qty 14
  Filled 2017-04-18 (×2): qty 2
  Filled 2017-04-18 (×2): qty 1
  Filled 2017-04-18: qty 2

## 2017-04-18 MED ORDER — LINAGLIPTIN 5 MG PO TABS
5.0000 mg | ORAL_TABLET | Freq: Every day | ORAL | Status: DC
  Administered 2017-04-19 – 2017-04-24 (×6): 5 mg via ORAL
  Filled 2017-04-18 (×6): qty 1
  Filled 2017-04-18: qty 7
  Filled 2017-04-18: qty 1

## 2017-04-18 MED ORDER — ALBUTEROL SULFATE HFA 108 (90 BASE) MCG/ACT IN AERS
1.0000 | INHALATION_SPRAY | Freq: Every day | RESPIRATORY_TRACT | Status: DC | PRN
  Administered 2017-04-23 – 2017-04-24 (×2): 2 via RESPIRATORY_TRACT
  Filled 2017-04-18: qty 6.7

## 2017-04-18 MED ORDER — MAGNESIUM HYDROXIDE 400 MG/5ML PO SUSP: 30.0000 mL | Freq: Every day | ORAL | Status: DC | PRN

## 2017-04-18 MED ORDER — PROMETHAZINE HCL 25 MG PO TABS
25.0000 mg | ORAL_TABLET | Freq: Two times a day (BID) | ORAL | Status: DC | PRN
  Administered 2017-04-19 – 2017-04-23 (×6): 25 mg via ORAL
  Filled 2017-04-18 (×6): qty 1

## 2017-04-18 MED ORDER — ASPIRIN EC 81 MG PO TBEC
81.0000 mg | DELAYED_RELEASE_TABLET | Freq: Every day | ORAL | Status: DC
  Administered 2017-04-19 – 2017-04-24 (×6): 81 mg via ORAL
  Filled 2017-04-18: qty 1
  Filled 2017-04-18: qty 7
  Filled 2017-04-18 (×6): qty 1

## 2017-04-18 MED ORDER — GLIMEPIRIDE 2 MG PO TABS
4.0000 mg | ORAL_TABLET | Freq: Every day | ORAL | Status: DC
  Administered 2017-04-19 – 2017-04-24 (×6): 4 mg via ORAL
  Filled 2017-04-18 (×2): qty 2
  Filled 2017-04-18: qty 14
  Filled 2017-04-18: qty 2
  Filled 2017-04-18 (×2): qty 1
  Filled 2017-04-18: qty 2
  Filled 2017-04-18: qty 1
  Filled 2017-04-18: qty 2
  Filled 2017-04-18: qty 1

## 2017-04-18 MED ORDER — TAMSULOSIN HCL 0.4 MG PO CAPS
0.4000 mg | ORAL_CAPSULE | Freq: Every day | ORAL | Status: DC
  Administered 2017-04-19 – 2017-04-24 (×6): 0.4 mg via ORAL
  Filled 2017-04-18 (×4): qty 1
  Filled 2017-04-18: qty 7
  Filled 2017-04-18 (×3): qty 1

## 2017-04-18 MED ORDER — FLUTICASONE PROPIONATE 50 MCG/ACT NA SUSP: 2.0000 | Freq: Every day | NASAL | Status: DC | PRN

## 2017-04-18 MED ORDER — METOPROLOL SUCCINATE ER 25 MG PO TB24
12.5000 mg | ORAL_TABLET | Freq: Every day | ORAL | Status: DC
  Administered 2017-04-19 – 2017-04-22 (×4): 12.5 mg via ORAL
  Administered 2017-04-23: 08:00:00 via ORAL
  Administered 2017-04-24: 12.5 mg via ORAL
  Filled 2017-04-18 (×2): qty 1
  Filled 2017-04-18: qty 4
  Filled 2017-04-18 (×5): qty 1

## 2017-04-18 MED ORDER — CLOPIDOGREL BISULFATE 75 MG PO TABS
75.0000 mg | ORAL_TABLET | Freq: Every day | ORAL | Status: DC
  Administered 2017-04-19 – 2017-04-24 (×6): 75 mg via ORAL
  Filled 2017-04-18 (×6): qty 1
  Filled 2017-04-18: qty 7
  Filled 2017-04-18: qty 1

## 2017-04-18 MED ORDER — METOPROLOL SUCCINATE ER 25 MG PO TB24: 12.5000 mg | ORAL_TABLET | Freq: Every day | ORAL | 0 refills | Status: DC

## 2017-04-18 MED ORDER — METFORMIN HCL 500 MG PO TABS
1000.0000 mg | ORAL_TABLET | Freq: Two times a day (BID) | ORAL | Status: DC
  Administered 2017-04-18 – 2017-04-24 (×12): 1000 mg via ORAL
  Filled 2017-04-18 (×2): qty 2
  Filled 2017-04-18: qty 28
  Filled 2017-04-18 (×8): qty 2
  Filled 2017-04-18: qty 28
  Filled 2017-04-18 (×4): qty 2
  Filled 2017-04-18: qty 1
  Filled 2017-04-18: qty 2

## 2017-04-18 MED ORDER — NITROGLYCERIN 0.4 MG SL SUBL: 0.4000 mg | SUBLINGUAL_TABLET | SUBLINGUAL | Status: DC | PRN

## 2017-04-18 MED ORDER — CLONAZEPAM 1 MG PO TABS
1.0000 mg | ORAL_TABLET | Freq: Two times a day (BID) | ORAL | Status: DC | PRN
  Administered 2017-04-18 – 2017-04-19 (×3): 1 mg via ORAL
  Filled 2017-04-18 (×3): qty 1

## 2017-04-18 MED ORDER — DULOXETINE HCL 60 MG PO CPEP
60.0000 mg | ORAL_CAPSULE | Freq: Every day | ORAL | Status: DC
  Administered 2017-04-19: 60 mg via ORAL
  Filled 2017-04-18 (×3): qty 1

## 2017-04-18 NOTE — Discharge Summary (Signed)
Physician Discharge Summary  Derek Chen FTD:[id number] DOB: [birth year] DOA: 04/16/2017  PCP: Kennyth Lose, PA-C  Admit date: 04/16/2017 Discharge date: 04/18/2017  Admitted From: Home Disposition:  [facility]  Recommendations for Outpatient Follow-up:  1. Follow up with PCP in 1-2 weeks 2. Please obtain BMP/CBC in one week    Discharge Condition: Stable CODE STATUS:FULL Diet recommendation: Heart Healthy    Brief/Interim Summary: 57 year old male with a history of hypertension, hyperlipidemia, diabetes mellitus type 2, depression, cardiomyopathy, coronary artery disease presenting with 1 day history of chest pain that he describes as sharp and constant located substernally. He describes associated shortness of breath and diaphoresis. EMS was initially activated because the patient has suicidal ideations. He stated he was having dreams that he is about to get killed by someone. The patient was afebrile hemodynamically stable saturating 98% on room air. Cardiology was consulted to assist.  They did not feel the patient needed any further workup.  Patient did have transient hypotension likely related to his metoprolol, Imdur, and lisinopril all of which were decreased.  His blood pressure ultimately improved.  He was deemed medically stable to transfer to a behavioral health facility.    Discharge Diagnoses:  Atypical chest pain -Troponins negative x3 -Personally reviewed EKG--sinus rhythm, RBBB-unchanged -Appreciate cardiology consult-->no further workup -Echocardiogram-- -personally reviewed CXR--no infiltrates or edema  Suicidal ideation -Psychiatry consult-->recommended inpatient treatment -Continue home dose Cymbalta -continue one-on-one sitter  Hypotension -decrease metoprolol to 12.5 mg daily -decrease lisinopril to 2.5 mg daily -improved with IVF  Opioid/benzodiazepine seeking behavior -Patient stated that he takes butalbital and Klonopin 3 times  daily -The [place] Controlled Substance Reporting System has been queried for this patient--No controlled substances have been filled since 09/04/16 as pt claims -after revealing this info, pt now states he has not have any opioids or Benzos for many months because he failed UDS  CAD - s/p DES to LAD in 2011 with patent stent by cath in 0623. - cyclic troponin values have been negative. Repeat echo is pending.  - continue ASA81 mg, Plavix, BB, Imdur, and statin therapy  Ischemic Cardiomyopathy - EF reduced to 35-40% by echo in 08/2015. -clinically euvolemic -continue metoprolol succinate and lisinopril  Tobacco abuse/COPD -NicoDerm patch -Stable on room air  Diabetes mellitus type 2 -novolog sliding scale -holding amaryl and metformin--resume after d/c  Essential hypertension -Continue Imdur and lisinopril and metoprolol succinate at decreased doses  HLD -continue statin     Discharge Instructions  Discharge Instructions    Diet - low sodium heart healthy   Complete by:  As directed    Increase activity slowly   Complete by:  As directed      Allergies as of 04/18/2017      Reactions   Ketorolac Diarrhea   Advil [ibuprofen] Itching   Dilaudid [hydromorphone] Nausea And Vomiting   Tolerates IV formulation--unable to tolerate pill form   Mobic [meloxicam] Other (See Comments)   Severe headaches.   Naproxen Nausea Only   Tramadol Hives, Itching   Vicodin [hydrocodone-acetaminophen] Hives   Cimetidine Other (See Comments), Rash   headaches Severe headache   Gabapentin Anxiety   "Neck twitches"   Tape Rash   Paper tape      Medication List    STOP taking these medications   butalbital-acetaminophen-caffeine 50-325-40 MG tablet Commonly known as:  FIORICET, ESGIC   zolpidem 10 MG tablet Commonly known as:  AMBIEN     TAKE these medications   albuterol (  2.5 MG/3ML) 0.083% nebulizer solution Commonly known as:  PROVENTIL Take 3 mLs (2.5  mg total) by nebulization every 6 (six) hours as needed for wheezing or shortness of breath.   Albuterol Sulfate 108 (90 Base) MCG/ACT Aepb Inhale 1-2 puffs into the lungs daily as needed (for shortness of breath/wheezing).   aspirin 81 MG EC tablet Take 1 tablet (81 mg total) by mouth daily. Start taking on:  04/19/2017 What changed:    medication strength  how much to take   budesonide 0.5 MG/2ML nebulizer solution Commonly known as:  PULMICORT Take 0.5 mg by nebulization daily as needed for shortness of breath/wheezing   Cinnamon 500 MG capsule Take 1,000 mg by mouth daily.   clonazePAM 1 MG tablet Commonly known as:  KLONOPIN Take 1 mg by mouth 2 (two) times daily as needed for anxiety.   clopidogrel 75 MG tablet Commonly known as:  PLAVIX Take 1 tablet (75 mg total) by mouth daily.   DULoxetine 60 MG capsule Commonly known as:  CYMBALTA Take 1 capsule (60 mg total) by mouth daily.   fluticasone 50 MCG/ACT nasal spray Commonly known as:  FLONASE Place 2 sprays into both nostrils daily as needed for allergies or rhinitis.   glimepiride 4 MG tablet Commonly known as:  AMARYL Take 4 mg by mouth daily with breakfast.   isosorbide mononitrate 60 MG 24 hr tablet Commonly known as:  IMDUR Take 1 tablet (60 mg total) by mouth daily. Start taking on:  04/19/2017 What changed:  how much to take   lisinopril 2.5 MG tablet Commonly known as:  PRINIVIL,ZESTRIL Take 1 tablet (2.5 mg total) by mouth daily. Start taking on:  04/19/2017 What changed:    medication strength  how much to take   metFORMIN 1000 MG tablet Commonly known as:  GLUCOPHAGE Take 1,000 mg by mouth 2 (two) times daily.   metoprolol succinate 25 MG 24 hr tablet Commonly known as:  TOPROL-XL Take 0.5 tablets (12.5 mg total) by mouth daily. Start taking on:  04/19/2017 What changed:    medication strength  how much to take   nitroGLYCERIN 0.4 MG SL tablet Commonly known as:  NITROSTAT Place  0.4 mg under the tongue every 5 (five) minutes as needed for chest pain.   pantoprazole 40 MG tablet Commonly known as:  PROTONIX Take 1 tablet (40 mg total) by mouth daily.   promethazine 25 MG tablet Commonly known as:  PHENERGAN Take 1 tablet (25 mg total) by mouth 2 (two) times daily as needed for nausea or vomiting.   rosuvastatin 10 MG tablet Commonly known as:  CRESTOR Take 1 tablet (10 mg total) by mouth daily.   sitaGLIPtin 100 MG tablet Commonly known as:  JANUVIA Take 100 mg by mouth daily.   tamsulosin 0.4 MG Caps capsule Commonly known as:  FLOMAX Take 1 capsule (0.4 mg total) by mouth daily.       Allergies  Allergen Reactions  . Ketorolac Diarrhea  . Advil [Ibuprofen] Itching  . Dilaudid [Hydromorphone] Nausea And Vomiting    Tolerates IV formulation--unable to tolerate pill form  . Mobic [Meloxicam] Other (See Comments)    Severe headaches.  . Naproxen Nausea Only  . Tramadol Hives and Itching  . Vicodin [Hydrocodone-Acetaminophen] Hives  . Cimetidine Other (See Comments) and Rash    headaches Severe headache  . Gabapentin Anxiety    "Neck twitches"  . Tape Rash    Paper tape    Consultations:  Cardiology  psychiatry   Procedures/Studies: Dg Chest 2 View  Result Date: 04/16/2017 CLINICAL DATA:  Chest pain. EXAM: CHEST  2 VIEW COMPARISON:  Chest x-ray dated December 03, 2015. FINDINGS: The heart size and mediastinal contours are within normal limits. Normal pulmonary vascularity. Coarsened interstitial markings are similar to prior study. No focal consolidation, pleural effusion, or pneumothorax. No acute osseous abnormality. IMPRESSION: Chronic bronchitic changes.  No active cardiopulmonary disease. Electronically Signed   By: Titus Dubin M.D.   On: 04/16/2017 16:39        Discharge Exam: Vitals:   04/18/17 0821 04/18/17 0855  BP: 110/72   Pulse:    Resp:    Temp:    SpO2:  96%   Vitals:   04/18/17 0652 04/18/17 0700 04/18/17  0821 04/18/17 0855  BP:  (!) 98/48 110/72   Pulse: 70     Resp: 16     Temp: 98 F (36.7 C)     TempSrc: Oral     SpO2: 95%   96%  Weight:      Height:        General: Pt is alert, awake, not in acute distress Cardiovascular: RRR, S1/S2 +, no rubs, no gallops Respiratory: CTA bilaterally, no wheezing, no rhonchi Abdominal: Soft, NT, ND, bowel sounds + Extremities: no edema, no cyanosis   The results of significant diagnostics from this hospitalization (including imaging, microbiology, ancillary and laboratory) are listed below for reference.    Significant Diagnostic Studies: Dg Chest 2 View  Result Date: 04/16/2017 CLINICAL DATA:  Chest pain. EXAM: CHEST  2 VIEW COMPARISON:  Chest x-ray dated December 03, 2015. FINDINGS: The heart size and mediastinal contours are within normal limits. Normal pulmonary vascularity. Coarsened interstitial markings are similar to prior study. No focal consolidation, pleural effusion, or pneumothorax. No acute osseous abnormality. IMPRESSION: Chronic bronchitic changes.  No active cardiopulmonary disease. Electronically Signed   By: Titus Dubin M.D.   On: 04/16/2017 16:39     Microbiology: No results found for this or any previous visit (from the past 240 hour(s)).   Labs: Basic Metabolic Panel: Recent Labs  Lab 04/16/17 1559  NA 138  K 4.0  CL 102  CO2 26  GLUCOSE 158*  BUN 11  CREATININE 0.82  CALCIUM 9.2   Liver Function Tests: Recent Labs  Lab 04/16/17 1559  AST 14*  ALT 19  ALKPHOS 69  BILITOT 0.7  PROT 7.4  ALBUMIN 4.0   No results for input(s): LIPASE, AMYLASE in the last 168 hours. No results for input(s): AMMONIA in the last 168 hours. CBC: Recent Labs  Lab 04/16/17 1559  WBC 10.3  NEUTROABS 6.9  HGB 15.3  HCT 45.7  MCV 90.9  PLT 247   Cardiac Enzymes: Recent Labs  Lab 04/16/17 1859 04/17/17 0052 04/17/17 0703  TROPONINI <0.03 <0.03 <0.03   BNP: Invalid input(s): POCBNP CBG: Recent Labs  Lab  04/17/17 1139 04/17/17 1615 04/17/17 2226 04/18/17 0731 04/18/17 1135  GLUCAP 151* 74 105* 127* 194*    Time coordinating discharge:  Greater than 30 minutes  Signed:  Orson Eva, DO [facility] Pager: [phone] 04/18/2017, 12:05 PM

## 2017-04-18 NOTE — Progress Notes (Signed)
Patient is to be discharged to [facility] and in stable condition. Patient and family made aware and agreeable to discharge. Patient's IV and telemetry removed, WNL. Report given to [place], [facility] RN. [place] present and ready for transport.  Celestia Khat, RN

## 2017-04-18 NOTE — Clinical Social Work Note (Signed)
Patient's transport arranged with [employer] Patient signed a voluntary consent form. Voluntary consent form faxed to [facility] Patient going to Room 406 bed 1.  Transport will arrive at 3:30.     Webster Patrone, Clydene Pugh, LCSW

## 2017-04-18 NOTE — Progress Notes (Signed)
Psychoeducational Group Note  Date:  04/18/2017 Time:  2236  Group Topic/Focus:  Wrap-Up Group:   The focus of this group is to help patients review their daily goal of treatment and discuss progress on daily workbooks.  Participation Level: Did Not Attend  Participation Quality:  Not Applicable  Affect:  Not Applicable  Cognitive:  Not Applicable  Insight:  Not Applicable  Engagement in Group: Not Applicable  Additional Comments:  The patient did not attend group this evening since he remained in his bedroom.   Archie Balboa S 04/18/2017, 10:36 PM

## 2017-04-18 NOTE — Progress Notes (Signed)
*  PRELIMINARY RESULTS* Echocardiogram 2D Echocardiogram has been performed.  Leavy Cella 04/18/2017, 11:12 AM

## 2017-04-18 NOTE — Progress Notes (Signed)
Admission note:  This is the first psyche admission for this 57 yo male admitted voluntarily to [facility] for auditory hallucinations and depression.  He reports passive SI, however, he contracts for safety on the unit.  Patient reports some chest pain and states it is due to "my anxiety."  He has klonopin ordered for him.  Patient's stressors include a split with his wife 1.5 weeks ago and he started feeling "more suicidal."  He reports he does not feel safe at home.  His medical history includes arthritis, BPH, COPD, CAD, HTN, GERN, Hep B, MI (2016), sleep apnea and type 2 diabetes.  He is a smoker and drinks approximately 1.0 to 1.5 oz. Alcohol per week.  He denies any drug use.  His UDS is negative.  He states he lives in [place] with his mother and his wife is his main support system.  Patient was oriented to room and unit.

## 2017-04-18 NOTE — Progress Notes (Signed)
PROGRESS NOTE  Derek Chen:[id number] DOB: [birth year] DOA: 04/16/2017 PCP: Kennyth Lose, PA-C  Brief History:  57 year old male with a history of hypertension, hyperlipidemia, diabetes mellitus type 2, depression, cardiomyopathy, coronary artery disease presenting with 1 day history of chest pain that he describes as sharp and constant located substernally.  He describes associated shortness of breath and diaphoresis.  EMS was initially activated because the patient has suicidal ideations.  He stated he was having dreams that he is about to get killed by someone.  The patient was afebrile hemodynamically stable saturating 98% on room air.  Cardiology was consulted to assist.  They did not feel the patient needed any further workup.  Patient did have transient hypotension likely related to his metoprolol, Imdur, and lisinopril all of which were decreased.  His blood pressure ultimately improved.  He was deemed medically stable to transfer to a behavioral health facility.  Assessment/Plan: Atypical chest pain -Troponins negative x3 -Personally reviewed EKG--sinus rhythm, RBBB-unchanged -Appreciate cardiology consult-->no further workup -Echocardiogram-- -personally reviewed CXR--no infiltrates or edema  Suicidal ideation -Psychiatry consult-->recommended inpatient treatment -Continue home dose Cymbalta -continue one-on-one sitter  Hypotension -decrease metoprolol to 12.5 mg  -decrease lisinopril to 2.5 mg -improved with IVF  Opioid/benzodiazepine seeking behavior -Patient stated that he takes butalbital and Klonopin 3 times daily -The [place] Controlled Substance Reporting System has been queried for this patient--No controlled substances have been filled since 09/04/16 as pt claims -after revealing this info, pt now states he has not have any opioids or Benzos for many months because he failed UDS  CAD - s/p DES to LAD in 2011 with patent stent by cath in  1540. - cyclic troponin values have been negative. Repeat echo is pending.  - continue ASA 81 mg, Plavix, BB, Imdur, and statin therapy  Ischemic Cardiomyopathy - EF reduced to 35-40% by echo in 08/2015. - clinically euvolemic -continue metoprolol succinate and lisinopril   Tobacco abuse/COPD -NicoDerm patch -Stable on room air  Diabetes mellitus type 2 -check A1C -novolog sliding scale -holding amaryl and metformin  Essential hypertension -Continue Imdur and lisinopril and metoprolol succinate at decreased doses  HLD -continue statin   Disposition Plan:   Medically Stable for Discharge to [facility]  Family Communication:   No Family at bedside   Consultants:  Cardiology, psychiatry  Code Status:  FULL  DVT Prophylaxis:  [place] Lovenox   Procedures: As Listed in Progress Note Above  Antibiotics: None       Subjective: Patient continues to complain of intermittent chest discomfort that is worse with certain movements.  He denies any shortness breath, vomiting, diarrhea, abdominal pain, dysuria.  He has some intermittent nausea.  Objective: Vitals:   04/18/17 0652 04/18/17 0700 04/18/17 0821 04/18/17 0855  BP:  (!) 98/48 110/72   Pulse: 70     Resp: 16     Temp: 98 F (36.7 C)     TempSrc: Oral     SpO2: 95%   96%  Weight:      Height:        Intake/Output Summary (Last 24 hours) at 04/18/2017 1014 Last data filed at 04/18/2017 0800 Gross per 24 hour  Intake 617.5 ml  Output -  Net 617.5 ml   Weight change:  Exam:   General:  Pt is alert, follows commands appropriately, not in acute distress  HEENT: No icterus, No thrush, No neck mass, [place]/AT  Cardiovascular: RRR, S1/S2, no rubs, no gallops  Respiratory: CTA bilaterally, no wheezing, no crackles, no rhonchi  Abdomen: Soft/+BS, non tender, non distended, no guarding  Extremities: No edema, No lymphangitis, No petechiae, No rashes, no synovitis   Data  Reviewed: I have personally reviewed following labs and imaging studies Basic Metabolic Panel: Recent Labs  Lab 04/16/17 1559  NA 138  K 4.0  CL 102  CO2 26  GLUCOSE 158*  BUN 11  CREATININE 0.82  CALCIUM 9.2   Liver Function Tests: Recent Labs  Lab 04/16/17 1559  AST 14*  ALT 19  ALKPHOS 69  BILITOT 0.7  PROT 7.4  ALBUMIN 4.0   No results for input(s): LIPASE, AMYLASE in the last 168 hours. No results for input(s): AMMONIA in the last 168 hours. Coagulation Profile: No results for input(s): INR, PROTIME in the last 168 hours. CBC: Recent Labs  Lab 04/16/17 1559  WBC 10.3  NEUTROABS 6.9  HGB 15.3  HCT 45.7  MCV 90.9  PLT 247   Cardiac Enzymes: Recent Labs  Lab 04/16/17 1859 04/17/17 0052 04/17/17 0703  TROPONINI <0.03 <0.03 <0.03   BNP: Invalid input(s): POCBNP CBG: Recent Labs  Lab 04/17/17 0816 04/17/17 1139 04/17/17 1615 04/17/17 2226 04/18/17 0731  GLUCAP 237* 151* 74 105* 127*   HbA1C: No results for input(s): HGBA1C in the last 72 hours. Urine analysis:    Component Value Date/Time   COLORURINE YELLOW 03/18/2012 0402   APPEARANCEUR CLEAR 03/18/2012 0402   LABSPEC >1.030 (H) 03/18/2012 0402   PHURINE 5.5 03/18/2012 0402   GLUCOSEU NEGATIVE 03/18/2012 0402   HGBUR NEGATIVE 03/18/2012 0402   BILIRUBINUR NEGATIVE 03/18/2012 0402   KETONESUR NEGATIVE 03/18/2012 0402   PROTEINUR NEGATIVE 03/18/2012 0402   UROBILINOGEN 0.2 03/18/2012 0402   NITRITE NEGATIVE 03/18/2012 0402   LEUKOCYTESUR NEGATIVE 03/18/2012 0402   Sepsis Labs: @LABRCNTIP (procalcitonin:4,lacticidven:4) )No results found for this or any previous visit (from the past 240 hour(s)).   Scheduled Meds: . aspirin EC  81 mg Oral Daily  . budesonide  0.5 mg Nebulization BID  . clopidogrel  75 mg Oral Daily  . DULoxetine  60 mg Oral Daily  . enoxaparin (LOVENOX) injection  40 mg Subcutaneous QHS  . insulin aspart  0-9 Units Subcutaneous TID WC  . isosorbide mononitrate  60  mg Oral Daily  . linagliptin  5 mg Oral Daily  . lisinopril  2.5 mg Oral Daily  . metoprolol succinate  12.5 mg Oral Daily  . nicotine  21 mg Transdermal Daily  . pantoprazole  40 mg Oral Daily  . rosuvastatin  10 mg Oral Daily  . tamsulosin  0.4 mg Oral Daily   Continuous Infusions: . 0.9 % NaCl with KCl 20 mEq / L 75 mL/hr at 04/18/17 4098    Procedures/Studies: Dg Chest 2 View  Result Date: 04/16/2017 CLINICAL DATA:  Chest pain. EXAM: CHEST  2 VIEW COMPARISON:  Chest x-ray dated December 03, 2015. FINDINGS: The heart size and mediastinal contours are within normal limits. Normal pulmonary vascularity. Coarsened interstitial markings are similar to prior study. No focal consolidation, pleural effusion, or pneumothorax. No acute osseous abnormality. IMPRESSION: Chronic bronchitic changes.  No active cardiopulmonary disease. Electronically Signed   By: Titus Dubin M.D.   On: 04/16/2017 16:39    Orson Eva, DO  [facility] Pager [phone]  If 7PM-7AM, please contact night-coverage [email or web address] Password [email or web address] 04/18/2017, 10:14 AM   LOS: 0 days

## 2017-04-18 NOTE — BH Assessment (Signed)
[place] Assessment Progress Note  Pt has been accepted to [facility] 406-1, to come between 1530-1600. PT MUST BE D/C FROM HIS IV. Accepting provider is Earleen Newport, NP. Attending provider is Dr. Parke Poisson. Call report to [phone]. Please fax voluntary consent to [phone]. Pt's RN, Lilia Pro, and LCSW, [employer], notified.  Derek Chen. Derek Chen, [place], [place], LPCA Counselor

## 2017-04-18 NOTE — Clinical Social Work Note (Signed)
LCSW notified [place] in disposition that patient was medically stable to transfer to a behavioral health facility.     Arda Keadle, Clydene Pugh, LCSW

## 2017-04-18 NOTE — Progress Notes (Signed)
Nursing Progress Note: 7p-7a D: Pt currently presents with a depressed/brightened on approach affect and behavior. Pt states "I have been having real vivid hallucinations at night before bed. I also wake up thru the night with horrible night sweats. I can't get any rest and I fell like I am going crazy." Interacting appropriately with the milieu. Pt reports terrible sleep during the previous night with current medication regimen. Pt did attend wrap-up group.  A: Pt provided with medications per providers orders. Pt's labs and vitals were monitored throughout the night. Pt supported emotionally and encouraged to express concerns and questions. Pt educated on medications.  R: Pt's safety ensured with 15 minute and environmental checks. Pt currently denies SI, HI, and AVH. Pt verbally contracts to seek staff if SI,HI, or AVH occurs and to consult with staff before acting on any harmful thoughts. Will continue to monitor.

## 2017-04-18 NOTE — Tx Team (Signed)
Initial Treatment Plan 04/18/2017 5:37 PM Derek Chen:[id number]    PATIENT STRESSORS: Health problems Marital or family conflict   PATIENT STRENGTHS: Ability for insight Average or above average intelligence Capable of independent living General fund of knowledge Motivation for treatment/growth Supportive family/friends   PATIENT IDENTIFIED PROBLEMS: Depression Anxiety Suicidal thoughts "My depression, my anxiety, my self-worth, and people being able to believe in me"                     DISCHARGE CRITERIA:  Ability to meet basic life and health needs Improved stabilization in mood, thinking, and/or behavior Reduction of life-threatening or endangering symptoms to within safe limits Verbal commitment to aftercare and medication compliance  PRELIMINARY DISCHARGE PLAN: Attend aftercare/continuing care group Return to previous living arrangement  PATIENT/FAMILY INVOLVEMENT: This treatment plan has been presented to and reviewed with the patient, Derek Chen, and/or family member, .  The patient and family have been given the opportunity to ask questions and make suggestions.  [place], [place], [place] 04/18/2017, 5:37 PM

## 2017-04-19 DIAGNOSIS — F17210 Nicotine dependence, cigarettes, uncomplicated: Secondary | ICD-10-CM

## 2017-04-19 DIAGNOSIS — R45851 Suicidal ideations: Secondary | ICD-10-CM

## 2017-04-19 DIAGNOSIS — I10 Essential (primary) hypertension: Secondary | ICD-10-CM

## 2017-04-19 DIAGNOSIS — G43909 Migraine, unspecified, not intractable, without status migrainosus: Secondary | ICD-10-CM

## 2017-04-19 DIAGNOSIS — F4320 Adjustment disorder, unspecified: Secondary | ICD-10-CM

## 2017-04-19 DIAGNOSIS — Z736 Limitation of activities due to disability: Secondary | ICD-10-CM

## 2017-04-19 DIAGNOSIS — F1099 Alcohol use, unspecified with unspecified alcohol-induced disorder: Secondary | ICD-10-CM

## 2017-04-19 DIAGNOSIS — E785 Hyperlipidemia, unspecified: Secondary | ICD-10-CM

## 2017-04-19 DIAGNOSIS — I2510 Atherosclerotic heart disease of native coronary artery without angina pectoris: Secondary | ICD-10-CM

## 2017-04-19 DIAGNOSIS — F333 Major depressive disorder, recurrent, severe with psychotic symptoms: Principal | ICD-10-CM

## 2017-04-19 MED ORDER — HYDROXYZINE HCL 50 MG PO TABS
50.0000 mg | ORAL_TABLET | Freq: Four times a day (QID) | ORAL | Status: DC | PRN
  Administered 2017-04-19 – 2017-04-24 (×14): 50 mg via ORAL
  Filled 2017-04-19 (×13): qty 1
  Filled 2017-04-19: qty 10

## 2017-04-19 MED ORDER — NICOTINE 21 MG/24HR TD PT24
21.0000 mg | MEDICATED_PATCH | Freq: Every day | TRANSDERMAL | Status: DC
  Administered 2017-04-19 – 2017-04-24 (×6): 21 mg via TRANSDERMAL
  Filled 2017-04-19 (×8): qty 1

## 2017-04-19 MED ORDER — DULOXETINE HCL 60 MG PO CPEP
90.0000 mg | ORAL_CAPSULE | Freq: Every day | ORAL | Status: DC
  Administered 2017-04-20 – 2017-04-21 (×2): 90 mg via ORAL
  Filled 2017-04-19 (×3): qty 1

## 2017-04-19 MED ORDER — MIRTAZAPINE 15 MG PO TABS
15.0000 mg | ORAL_TABLET | Freq: Every day | ORAL | Status: DC
  Administered 2017-04-19 – 2017-04-22 (×4): 15 mg via ORAL
  Filled 2017-04-19 (×7): qty 1

## 2017-04-19 MED ORDER — BUTALBITAL-APAP-CAFFEINE 50-325-40 MG PO TABS
1.0000 | ORAL_TABLET | Freq: Four times a day (QID) | ORAL | Status: DC | PRN
  Administered 2017-04-19 – 2017-04-24 (×16): 1 via ORAL
  Filled 2017-04-19 (×16): qty 1

## 2017-04-19 MED ORDER — DULOXETINE HCL 30 MG PO CPEP
30.0000 mg | ORAL_CAPSULE | Freq: Once | ORAL | Status: AC
  Administered 2017-04-19: 30 mg via ORAL
  Filled 2017-04-19 (×2): qty 1

## 2017-04-19 MED ORDER — OLANZAPINE 5 MG PO TABS
5.0000 mg | ORAL_TABLET | Freq: Every day | ORAL | Status: DC
  Administered 2017-04-19 – 2017-04-21 (×3): 5 mg via ORAL
  Filled 2017-04-19 (×6): qty 1

## 2017-04-19 NOTE — BHH Group Notes (Signed)
LCSW Group Therapy Note  04/19/2017     10:15-11:15AM  Type of Therapy and Topic:  Group Therapy:  Decisional Balance/Substance Use  Participation Level:  Active        Description of Group:  The main focus of today's process group was learning how to use a decisional balance exercise to make a decision about whether to change an unhealthy coping skill, as well as how to use the information gathered in the actual process of planning that change.  Patients listed some of their most frequently utilized unhealthy coping techniques and CSW pointed out the similarities.  Motivational Interviewing was utilized to help patients explore in-depth the perceived benefits and costs of unhealthy coping techniques (with an emphasis on drinking & drugging) as well as the benefits and costs of replacing that with other, healthy coping skills.  A handout was distributed for patients to be able to do this exercise for themselves.     Therapeutic Goals 1. Patient will be able to utilize the decision balance exercise on their own 2. Patient will list coping skills they use to fulfill their needs 3. Patient will identify the differences between healthy and  unhealthy coping skills 4. Patient will verbalize the costs and benefits of drinking/drugging versus making the choice to change 5. Patient will learn how about how using this exercise on an ongoing basis can help to make decisions about whether to make a change, and how everyone is at a different place in that decision-making process.  Summary of Patient Progress: During group, patient expressed that he feels worthless and pays too much attention to the thoughts in his mind that tell him to do things that he shouldn't.  He talked extensively about wanting to be a person who does not lie and whose family can trust him.  He talked about his drinking as an unhealthy coping mechanism as well.  He was very involved in the discussion.   Therapeutic  Modalities Cognitive Behavioral Therapy Motivational Interviewing  Selmer Dominion, LCSW 04/19/2017, 1:58 PM

## 2017-04-19 NOTE — Plan of Care (Signed)
Patient verbalizes understanding of information, education provided. 

## 2017-04-19 NOTE — Progress Notes (Signed)
D: Patient observed up and visible in the milieu. Patient states he is quite anxious this morning, complains of a headache of an 8/10. Asking for his fiorcet which he states he takes at home and does not abuse. "I've taken it for years." Patient's affect flat, anxious and depressed with congruent mood. Per self inventory and discussions with writer, rates depression at a 9/10, hopelessness at an 8/10 and anxiety at a 10/10. Rates sleep as fair, appetite as fair, energy as normal and concentration as poor.  States goal for today is "self esteem, positive thoughts."    A: Medicated per orders, klonopin prn given for anxiety. Level III obs in place for safety. Emotional support offered and self inventory reviewed. Encouraged completion of Suicide Safety Plan and programming participation. Discussed POC with MD, SW.  Fall prevention plan in place and reviewed with patient as pt is a high fall risk due to hx of falls PTA.   R: Patient verbalizes understanding of POC, falls prevention education. On reassess, patient is initially calmer however at lunch time became anxious again. Patient denies SI/HI/AVH and remains safe on level III obs. Will continue to monitor closely and make verbal contact frequently.

## 2017-04-19 NOTE — Progress Notes (Signed)
Nursing Progress Note: 7p-7a D: Pt currently presents with a depressed/anxious/helpless/hopeless affect and behavior. Pt states "I hear voices. They are always telling me I'm not worth anything and that I am a lost cause. I want to get better. I just get so down when I hear the voices." Interacting appropriately with the milieu. Pt reports off and on sleep during the previous night with current medication regimen. Pt did attend wrap-up group.  A: Pt provided with medications per providers orders. Pt's labs and vitals were monitored throughout the night. Pt supported emotionally and encouraged to express concerns and questions. Pt educated on medications.  R: Pt's safety ensured with 15 minute and environmental checks. Pt currently denies SI, HI, and VH and endorses AH. Pt verbally contracts to seek staff if SI,HI, or AVH occurs and to consult with staff before acting on any harmful thoughts. Will continue to monitor.

## 2017-04-19 NOTE — BHH Suicide Risk Assessment (Signed)
[facility] Admission Suicide Risk Assessment   Nursing information obtained from:    Demographic factors:    Current Mental Status:    Loss Factors:    Historical Factors:    Risk Reduction Factors:     Total Time spent with patient: 30 minutes Principal Problem: MDD Diagnosis:   Patient Active Problem List   Diagnosis Date Noted  . 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 Suicidal ideations [R45.851] 04/16/2017  . Atypical chest pain [R07.89] 11/15/2015  . Hypokalemia [E87.6] 09/15/2015  . Chest pain [R07.9] 09/15/2015  . Insomnia [G47.00] 09/15/2015  . Depression [F32.9] 09/15/2015  . 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 Non-insulin dependent type 2 diabetes mellitus ([place]) [E11.9]   . Erectile dysfunction associated with type 2 diabetes mellitus ([place]) [E11.69, N52.1] 01/21/2014  . COPD, moderate ([place]) [J44.9] 07/15/2013  . Diabetes ([place]) [E11.9] 11/06/2012  . BPH (benign prostatic hyperplasia) [N40.0] 11/06/2012  . 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 Palpitations [R00.2] 03/29/2012  . Dyspnea and respiratory abnormality [R06.00, R06.89] 03/29/2012  . Breath shortness [R06.02] 03/29/2012  . Acute myocardial infarction, subendocardial infarction ([place]) [I21.4] 03/27/2012  . Heart disease [I51.9] 03/27/2012  . Hepatitis B antibody positive [R76.8] 03/27/2012  . Coronary atherosclerosis [I25.10] 03/27/2012  . Acute non-ST segment elevation myocardial infarction ([place]) [I21.4] 03/27/2012  . Left ventricular dysfunction [I51.9] 03/27/2012  . Anxiety [F41.9] 03/27/2012  . Essential hypertension, benign [I10]   . Basal cell carcinoma [C44.91]   . Coronary atherosclerosis of native coronary artery [I25.10]   . Hyperlipidemia with target LDL less than 100 [E78.5] 04/19/2010   Subjective Data:  57 y.o Caucasian male, recently separated, moved in with his  mother, on disability. Background history of MDD recurrent and multiple medical issues. Presented to the ER via the EMS. Called for help because he was having overwhelming suicidal thoughts. While in the ambulance developed chest pain. Main stressor is separation from his wife three weeks ago. He has been hearing a very critical voice since then. EKG was significant for RBBB. He evaluated by cardiology and managed at the ER prior to transfer. No substances on board. No past suicidal behavior, no family history of suicide. No evidence of mania. No cognitive impairment. No access to weapons. He is cooperative with care. He has agreed to treatment recommendations. He has agreed to communicate suicidal thoughts of with staff if the thoughts becomes overwhelming.      Continued Clinical Symptoms:  Alcohol Use Disorder Identification Test Final Score (AUDIT): 3 The "Alcohol Use Disorders Identification Test", Guidelines for Use in Primary Care, Second Edition.  World Pharmacologist [facility]). Score between 0-7:  no or low risk or alcohol related problems. Score between 8-15:  moderate risk of alcohol related problems. Score between 16-19:  high risk of alcohol related problems. Score 20 or above:  warrants further diagnostic evaluation for alcohol dependence and treatment.   CLINICAL FACTORS:   Depression:   Severe   Musculoskeletal: Strength & Muscle Tone: within normal limits Gait & Station: normal Patient leans: N/A  Psychiatric Specialty Exam: Physical Exam  ROS  Blood pressure 128/71, pulse 79, temperature 97.9 F (36.6 C), temperature source Oral, resp. rate 16, height 5\' 11"  (1.803 m), weight 73 kg (161 lb).Body mass index is 22.45 kg/m.  General Appearance: As in H&P  Eye Contact:    Speech:    Volume:    Mood:    Affect:  Thought Process:    Orientation:    Thought Content:    Suicidal Thoughts:    Homicidal Thoughts:    Memory:    Judgement:  As in H&P  Insight:     Psychomotor Activity:    Concentration:    Recall:    Fund of Knowledge:    Language:    Akathisia:    Handed:    AIMS (if indicated):     Assets:    ADL's:    Cognition:  As in H&P  Sleep:  Number of Hours: 6.5      COGNITIVE FEATURES THAT CONTRIBUTE TO RISK:  None    SUICIDE RISK:   Mild:  Suicidal ideation of limited frequency, intensity, duration, and specificity.  There are no identifiable plans, no associated intent, mild dysphoria and related symptoms, good self-control (both objective and subjective assessment), few other risk factors, and identifiable protective factors, including available and accessible social support.  PLAN OF CARE:  As in H&P  I certify that inpatient services furnished can reasonably be expected to improve the patient's condition.   [place], MD 04/19/2017, 3:58 PM

## 2017-04-19 NOTE — H&P (Signed)
Psychiatric Admission Assessment Adult  Patient Identification: Derek Chen MRN:  [id number] Date of Evaluation:  04/19/2017 Chief Complaint:  Worsening depression with suicidal thoughts Principal Diagnosis: MDD Diagnosis:   Patient Active Problem List   Diagnosis Date Noted  . MDD (major depressive disorder), recurrent, severe, with psychosis ([place]) [F33.3] 04/18/2017  . Suicidal ideations [R45.851] 04/16/2017  . Atypical chest pain [R07.89] 11/15/2015  . Hypokalemia [E87.6] 09/15/2015  . Chest pain [R07.9] 09/15/2015  . Insomnia [G47.00] 09/15/2015  . Depression [F32.9] 09/15/2015  . 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 Non-insulin dependent type 2 diabetes mellitus ([place]) [E11.9]   . Erectile dysfunction associated with type 2 diabetes mellitus ([place]) [E11.69, N52.1] 01/21/2014  . COPD, moderate ([place]) [J44.9] 07/15/2013  . Diabetes ([place]) [E11.9] 11/06/2012  . BPH (benign prostatic hyperplasia) [N40.0] 11/06/2012  . 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 Palpitations [R00.2] 03/29/2012  . Dyspnea and respiratory abnormality [R06.00, R06.89] 03/29/2012  . Breath shortness [R06.02] 03/29/2012  . Acute myocardial infarction, subendocardial infarction ([place]) [I21.4] 03/27/2012  . Heart disease [I51.9] 03/27/2012  . Hepatitis B antibody positive [R76.8] 03/27/2012  . Coronary atherosclerosis [I25.10] 03/27/2012  . Acute non-ST segment elevation myocardial infarction ([place]) [I21.4] 03/27/2012  . Left ventricular dysfunction [I51.9] 03/27/2012  . Anxiety [F41.9] 03/27/2012  . Essential hypertension, benign [I10]   . Basal cell carcinoma [C44.91]   . Coronary atherosclerosis of native coronary artery [I25.10]   . Hyperlipidemia with target LDL less than 100 [E78.5] 04/19/2010   History of Present Illness:   57 y.o Caucasian male, recently separated, moved in with his mother, on disability. Background history of  MDD recurrent and multiple medical issues. Presented to the ER via the EMS. Called for help because he was having overwhelming suicidal thoughts. While in the ambulance developed chest pain. Main stressor is separation from his wife three weeks ago. He has been hearing a very critical voice since then. EKG was significant for RBBB. He evaluated by cardiology and managed at the ER prior to transfer. No substances on board.  At interview, patient reports a long history of depression. Says he has been taking his medications as prescribed. He is on probation for identity theft. Says he lies a lot to cover his self esteem. Patient says his actions led to their separation three weeks ago. He moved in with his mother. Says his mother has dementia. It has been overwhelming for him looking after his mother. Says he has gotten more depressed. He barely gets two to four hours of sleep at night. Says he " feels as if the walls are closing in ,,,, I hear the voice of the demon telling me I am worthless ,,,, kill my self ,,,,, I have severe anxiety and headache most of the time ,,,, I have vivid dreams at night". Patient reports deterioration in his appetite. Says he feels drained most of the time. He has been having strong thoughts of ending it all. Says he has no specific plans. He decided to come seek help. Says he would everything possible to gain back the trust of his wife. Patient wants [name] to speak to [facility] and let her know he is serious on getting help. Patient says this is his third marriage. Says he used to drink a bit in the past. Says he is too old for that now.  No evidence of mania. No evidence of PTSD. No substance use. No thoughts of harming others. No thoughts  of violence. No access to weapons. Denies any other stressors at this time.    Total Time spent with patient: 1 hour  Past Psychiatric History: Depression for over six years. He is treated in out patient with Duloxetine 60 mg daily. He used to be  on Benzodiazepines and opiates. No past inpatient care.  No past history of mania. No past history of psychosis. No past history of suicidal behavior. No past history of violent behavior.    Is the patient at risk to self? Yes.    Has the patient been a risk to self in the past 6 months? No.  Has the patient been a risk to self within the distant past? No.  Is the patient a risk to others? No.  Has the patient been a risk to others in the past 6 months? No.  Has the patient been a risk to others within the distant past? No.   Prior Inpatient Therapy:   Prior Outpatient Therapy:    Alcohol Screening: 1. How often do you have a drink containing alcohol?: 2 to 4 times a month 2. How many drinks containing alcohol do you have on a typical day when you are drinking?: 1 or 2 3. How often do you have six or more drinks on one occasion?: Less than monthly AUDIT-C Score: 3 4. How often during the last year have you found that you were not able to stop drinking once you had started?: Never 5. How often during the last year have you failed to do what was normally expected from you becasue of drinking?: Never 6. How often during the last year have you needed a first drink in the morning to get yourself going after a heavy drinking session?: Never 7. How often during the last year have you had a feeling of guilt of remorse after drinking?: Never 8. How often during the last year have you been unable to remember what happened the night before because you had been drinking?: Never 9. Have you or someone else been injured as a result of your drinking?: No 10. Has a relative or friend or a doctor or another health worker been concerned about your drinking or suggested you cut down?: No Alcohol Use Disorder Identification Test Final Score (AUDIT): 3 Intervention/Follow-up: AUDIT Score <7 follow-up not indicated Substance Abuse History in the last 12 months:  Yes.   Consequences of Substance Abuse: As above   Previous Psychotropic Medications: Yes  Psychological Evaluations: Yes  Past Medical History:  Past Medical History:  Diagnosis Date  . Anxiety   . Arthritis   . Basal cell cancer    Chest and back - status post skin grafting  . BPH (benign prostatic hyperplasia)   . Cardiomyopathy (HCC)    LVEF 45-50%  . COPD (chronic obstructive pulmonary disease) ([place])   . Coronary artery disease    NSTEMI - DES mid LAD December 2011 (patent December 2013)  . Dyslipidemia   . Essential hypertension, benign   . GERD (gastroesophageal reflux disease)   . Hepatitis B antibody positive 2006  . History of kidney stones   . MI (myocardial infarction) ([place])    3 MI's last in 2016.  Marland Kitchen PONV (postoperative nausea and vomiting)    pt states that zofran and scope patch did not help PONV after last surgery.  . Sleep apnea   . Type 2 diabetes mellitus ([place]) 2012  . Viral pneumonia, unspecified 2004    Past Surgical History:  Procedure  Laterality Date  . APPENDECTOMY    . COLONOSCOPY N/A 08/10/2015   Procedure: COLONOSCOPY;  Surgeon: Rogene Houston, MD;  Location: [facility] ENDO SUITE;  Service: Endoscopy;  Laterality: N/A;  730  . CORONARY STENT PLACEMENT    . FINGER SURGERY Right    index, gnagrene in finger.  Marland Kitchen KNEE ARTHROSCOPY     Left  . LEFT HEART CATHETERIZATION WITH CORONARY ANGIOGRAM N/A 03/20/2012   Procedure: LEFT HEART CATHETERIZATION WITH CORONARY ANGIOGRAM;  Surgeon: Thayer Headings, MD;  Location: [facility];  Service: Cardiovascular;  Laterality: N/A;  . MASS EXCISION N/A 08/23/2016   Procedure: EXCISION BASAL CELL CARCINOMA ON BACK OVER 4 CM;  Surgeon: Aviva Signs, MD;  Location: [facility] ORS;  Service: General;  Laterality: N/A;  . PORT-A-CATH REMOVAL    . PORTACATH PLACEMENT    . SKIN GRAFT SPLIT THICKNESS TRUNK    01/23/2010    Right chest;  Felicie Morn, M.D.  . VASECTOMY     Family History:  Family History  Problem Relation Age of Onset  . Diabetes Mother   . Deep vein  thrombosis Mother   . Pulmonary embolism Mother   . Cancer Father        hodgins lymphoma  . Diabetes Sister    Family Psychiatric  History: strong family history of addiction. No family history of any other psychiatric illness or suicide.  Tobacco Screening: Have you used any form of tobacco in the last 30 days? (Cigarettes, Smokeless Tobacco, Cigars, and/or Pipes): Yes Tobacco use, Select all that apply: 5 or more cigarettes per day Are you interested in Tobacco Cessation Medications?: Yes, will notify MD for an order Counseled patient on smoking cessation including recognizing danger situations, developing coping skills and basic information about quitting provided: Refused/Declined practical counseling Social History:  Social History   Substance and Sexual Activity  Alcohol Use Yes  . Alcohol/week: 1.0 - 1.5 oz  . Types: 2 - 3 Standard drinks or equivalent per week   Comment: Drinks occasional beer and wine     Social History   Substance and Sexual Activity  Drug Use No    Additional Social History:     Has two grown children and four grand children. Current marriage is two years old.  Allergies:   Allergies  Allergen Reactions  . Ketorolac Diarrhea  . Advil [Ibuprofen] Itching  . Dilaudid [Hydromorphone] Nausea And Vomiting    Tolerates IV formulation--unable to tolerate pill form  . Mobic [Meloxicam] Other (See Comments)    Severe headaches.  . Naproxen Nausea Only  . Tramadol Hives and Itching  . Vicodin [Hydrocodone-Acetaminophen] Hives  . Cimetidine Other (See Comments) and Rash    headaches Severe headache  . Gabapentin Anxiety    "Neck twitches"  . Tape Rash    Paper tape   Lab Results:  Results for orders placed or performed during the hospital encounter of 04/16/17 (from the past 48 hour(s))  Glucose, capillary     Status: None   Collection Time: 04/17/17  4:15 PM  Result Value Ref Range   Glucose-Capillary 74 65 - 99 mg/dL   Comment 1 Notify RN     Comment 2 Document in Chart   Glucose, capillary     Status: Abnormal   Collection Time: 04/17/17 10:26 PM  Result Value Ref Range   Glucose-Capillary 105 (H) 65 - 99 mg/dL  Glucose, capillary     Status: Abnormal   Collection Time: 04/18/17  7:31 AM  Result Value Ref Range   Glucose-Capillary 127 (H) 65 - 99 mg/dL   Comment 1 Notify RN    Comment 2 Document in Chart   Glucose, capillary     Status: Abnormal   Collection Time: 04/18/17 11:35 AM  Result Value Ref Range   Glucose-Capillary 194 (H) 65 - 99 mg/dL   Comment 1 Notify RN    Comment 2 Document in Chart     Blood Alcohol level:  Lab Results  Component Value Date   ETH <10 04/16/2017   ETH 76 (H) 53/66/4403    Metabolic Disorder Labs:  Lab Results  Component Value Date   HGBA1C 5.8 (H) 11/17/2015   MPG 120 11/17/2015   MPG 137 09/15/2015   No results found for: PROLACTIN Lab Results  Component Value Date   CHOL 142 09/16/2015   TRIG 112 09/16/2015   HDL 36 (L) 09/16/2015   CHOLHDL 3.9 09/16/2015   VLDL 22 09/16/2015   LDLCALC 84 09/16/2015   LDLCALC 139 (H) 11/10/2013    Current Medications: Current Facility-Administered Medications  Medication Dose Route Frequency Provider Last Rate Last Dose  . albuterol (PROVENTIL HFA;VENTOLIN HFA) 108 (90 Base) MCG/ACT inhaler 1-2 puff  1-2 puff Inhalation Daily PRN Rankin, Shuvon B, NP      . alum & mag hydroxide-simeth (MAALOX/MYLANTA) 200-200-20 MG/5ML suspension 30 mL  30 mL Oral Q4H PRN Rankin, Shuvon B, NP      . aspirin EC tablet 81 mg  81 mg Oral Daily Rankin, Shuvon B, NP   81 mg at 04/19/17 4742  . clonazePAM (KLONOPIN) tablet 1 mg  1 mg Oral BID PRN Rankin, Shuvon B, NP   1 mg at 04/19/17 0824  . clopidogrel (PLAVIX) tablet 75 mg  75 mg Oral Daily Rankin, Shuvon B, NP   75 mg at 04/19/17 5956  . DULoxetine (CYMBALTA) DR capsule 60 mg  60 mg Oral Daily Rankin, Shuvon B, NP   60 mg at 04/19/17 3875  . fluticasone (FLONASE) 50 MCG/ACT nasal spray 2 spray  2  spray Each Nare Daily PRN Rankin, Shuvon B, NP      . glimepiride (AMARYL) tablet 4 mg  4 mg Oral Q breakfast Rankin, Shuvon B, NP   4 mg at 04/19/17 6433  . isosorbide mononitrate (IMDUR) 24 hr tablet 60 mg  60 mg Oral Daily Rankin, Shuvon B, NP   60 mg at 04/19/17 2951  . linagliptin (TRADJENTA) tablet 5 mg  5 mg Oral Daily Rankin, Shuvon B, NP   5 mg at 04/19/17 0823  . lisinopril (PRINIVIL,ZESTRIL) tablet 2.5 mg  2.5 mg Oral Daily Rankin, Shuvon B, NP   2.5 mg at 04/19/17 0823  . magnesium hydroxide (MILK OF MAGNESIA) suspension 30 mL  30 mL Oral Daily PRN Rankin, Shuvon B, NP      . metFORMIN (GLUCOPHAGE) tablet 1,000 mg  1,000 mg Oral BID WC Rankin, Shuvon B, NP   1,000 mg at 04/19/17 0823  . metoprolol succinate (TOPROL-XL) 24 hr tablet 12.5 mg  12.5 mg Oral Daily Rankin, Shuvon B, NP   12.5 mg at 04/19/17 8841  . nicotine (NICODERM CQ - dosed in mg/24 hours) patch 21 mg  21 mg Transdermal Daily Izediuno, Laruth Bouchard, MD   21 mg at 04/19/17 0832  . nitroGLYCERIN (NITROSTAT) SL tablet 0.4 mg  0.4 mg Sublingual Q5 min PRN Rankin, Shuvon B, NP      . pantoprazole (PROTONIX) EC tablet 40 mg  40 mg  Oral Daily Rankin, Shuvon B, NP   40 mg at 04/19/17 6073  . promethazine (PHENERGAN) tablet 25 mg  25 mg Oral BID PRN Rankin, Shuvon B, NP      . rosuvastatin (CRESTOR) tablet 10 mg  10 mg Oral Daily Rankin, Shuvon B, NP   10 mg at 04/19/17 0823  . tamsulosin (FLOMAX) capsule 0.4 mg  0.4 mg Oral Daily Rankin, Shuvon B, NP   0.4 mg at 04/19/17 7106   PTA Medications: Medications Prior to Admission  Medication Sig Dispense Refill Last Dose  . albuterol (PROVENTIL) (2.5 MG/3ML) 0.083% nebulizer solution Take 3 mLs (2.5 mg total) by nebulization every 6 (six) hours as needed for wheezing or shortness of breath. 75 mL 3 Past Week at Unknown time  . Albuterol Sulfate 108 (90 Base) MCG/ACT AEPB Inhale 1-2 puffs into the lungs daily as needed (for shortness of breath/wheezing).    Past Month at Unknown time  .  aspirin EC 81 MG EC tablet Take 1 tablet (81 mg total) by mouth daily.     . budesonide (PULMICORT) 0.5 MG/2ML nebulizer solution Take 0.5 mg by nebulization daily as needed for shortness of breath/wheezing   Past Week at Unknown time  . Cinnamon 500 MG capsule Take 1,000 mg by mouth daily.    04/16/2017 at Unknown time  . clonazePAM (KLONOPIN) 1 MG tablet Take 1 mg by mouth 2 (two) times daily as needed for anxiety.   Past Month at Unknown time  . clopidogrel (PLAVIX) 75 MG tablet Take 1 tablet (75 mg total) by mouth daily. 90 tablet 0 04/16/2017 at Unknown time  . DULoxetine (CYMBALTA) 60 MG capsule Take 1 capsule (60 mg total) by mouth daily. 90 capsule 1 04/16/2017 at Unknown time  . fluticasone (FLONASE) 50 MCG/ACT nasal spray Place 2 sprays into both nostrils daily as needed for allergies or rhinitis.    Past Week at Unknown time  . glimepiride (AMARYL) 4 MG tablet Take 4 mg by mouth daily with breakfast.   04/16/2017 at Unknown time  . isosorbide mononitrate (IMDUR) 60 MG 24 hr tablet Take 1 tablet (60 mg total) by mouth daily. 30 tablet 0   . lisinopril (PRINIVIL,ZESTRIL) 2.5 MG tablet Take 1 tablet (2.5 mg total) by mouth daily. 30 tablet 0   . metFORMIN (GLUCOPHAGE) 1000 MG tablet Take 1,000 mg by mouth 2 (two) times daily.    04/16/2017 at Unknown time  . metoprolol succinate (TOPROL-XL) 25 MG 24 hr tablet Take 0.5 tablets (12.5 mg total) by mouth daily. 30 tablet 0   . nitroGLYCERIN (NITROSTAT) 0.4 MG SL tablet Place 0.4 mg under the tongue every 5 (five) minutes as needed for chest pain.   04/16/2017 at Unknown time  . pantoprazole (PROTONIX) 40 MG tablet Take 1 tablet (40 mg total) by mouth daily. 90 tablet 1 04/16/2017 at Unknown time  . promethazine (PHENERGAN) 25 MG tablet Take 1 tablet (25 mg total) by mouth 2 (two) times daily as needed for nausea or vomiting. 20 tablet 0 Past Month at Unknown time  . rosuvastatin (CRESTOR) 10 MG tablet Take 1 tablet (10 mg total) by mouth daily. 90 tablet  1 04/16/2017 at Unknown time  . sitaGLIPtin (JANUVIA) 100 MG tablet Take 100 mg by mouth daily.   04/16/2017 at Unknown time  . tamsulosin (FLOMAX) 0.4 MG CAPS capsule Take 1 capsule (0.4 mg total) by mouth daily. 90 capsule 3 04/16/2017 at Unknown time    Musculoskeletal: Strength & Muscle  Tone: within normal limits Gait & Station: broad based Patient leans: N/A  Psychiatric Specialty Exam: Physical Exam  Constitutional: He is oriented to person, place, and time. He appears well-developed and well-nourished.  HENT:  Head: Normocephalic and atraumatic.  Respiratory: Effort normal and breath sounds normal.  Neurological: He is alert and oriented to person, place, and time.  Psychiatric:  As above    ROS  Blood pressure 128/71, pulse 79, temperature 97.9 F (36.6 C), temperature source Oral, resp. rate 16, height 5\' 11"  (1.803 m), weight 73 kg (161 lb).Body mass index is 22.45 kg/m.  General Appearance: In bed prior to interview, withdrawn and slow to warm up. Moist palms. Appropriate behavior. Polite.  Eye Contact:  Good  Speech:  Decreased rate and tone  Volume:  Decreased  Mood:  Depressed  Affect:  Blunted and mood congruent  Thought Process:  Linear  Orientation:  Full (Time, Place, and Person)  Thought Content:  Negative ruminations, auditory hallucinations.   Suicidal Thoughts:  Yes.  without intent/plan  Homicidal Thoughts:  No  Memory:  Immediate;   Fair Recent;   Fair Remote;   Fair  Judgement:  Fair  Insight:  Good  Psychomotor Activity:  Decreased  Concentration:  Concentration: Fair and Attention Span: Fair  Recall:  Good  Fund of Knowledge:  Good  Language:  Good  Akathisia:  Negative  Handed:    AIMS (if indicated):     Assets:  Communication Skills Desire for Improvement Resilience  ADL's:  Intact  Cognition:  WNL  Sleep:  Number of Hours: 6.5    Treatment Plan Summary: Patient is severely depressed. Current episode was precipitated by recent  separation.  Psychosocial and health issues are perpetuating his depression. We discussed management plan as below. He consented to treatment after we reviewed the risks and benefits.   Psychiatric: MDD Recurrent Adjustment Disorder  Medical: CAD HTN DM HLD Migraine  Psychosocial:  Medical issues Recent separation New role as caretaker   PLAN: 1. Increase Duloxetine to 90 mg daily 2. Mirtazapine 15 mg HS. 3. Olanzapine 5 mg HS  4. Continue home medical medications at home dose 5. Encourage unit groups and therapeutic activities 6. Monitor mood, behavior and interaction with peers 7. SW would gather collateral from  [facility] or 336 [phone]   Observation Level/Precautions:  15 minute checks  Laboratory:    Psychotherapy:    Medications:    Consultations:    Discharge Concerns:    Estimated LOS:  Other:     Physician Treatment Plan for Primary Diagnosis: <principal problem not specified> Long Term Goal(s): Improvement in symptoms so as ready for discharge  Short Term Goals: Ability to identify changes in lifestyle to reduce recurrence of condition will improve, Ability to verbalize feelings will improve, Ability to disclose and discuss suicidal ideas, Ability to demonstrate self-control will improve, Ability to identify and develop effective coping behaviors will improve, Ability to maintain clinical measurements within normal limits will improve and Compliance with prescribed medications will improve  Physician Treatment Plan for Secondary Diagnosis: Active Problems:   MDD (major depressive disorder), recurrent, severe, with psychosis ([place])  Long Term Goal(s): Improvement in symptoms so as ready for discharge  Short Term Goals: Ability to identify changes in lifestyle to reduce recurrence of condition will improve, Ability to verbalize feelings will improve, Ability to disclose and discuss suicidal ideas, Ability to demonstrate self-control will improve,  Ability to identify and develop effective coping behaviors will improve, Ability to  maintain clinical measurements within normal limits will improve and Compliance with prescribed medications will improve  I certify that inpatient services furnished can reasonably be expected to improve the patient's condition.    [place], MD 1/19/20193:11 PM

## 2017-04-19 NOTE — [facility] Group Notes (Signed)
[place] Group Notes:  (Nursing/MHT/Case Management/Adjunct)  Date:  04/19/2017  Time:  0845  Type of Therapy:  Nurse Education - Unit Orientation, Goals Group  Participation Level:  Active  Participation Quality:  Attentive  Affect:  Blunted and Depressed  Cognitive:  Alert  Insight:  Improving  Engagement in Group:  Engaged  Modes of Intervention:  Education  Summary of Progress/Problems: Patient attended group and was attentive, engaged.   Loletta Specter [facility] 04/19/2017, 9:56 AM

## 2017-04-20 MED ORDER — GABAPENTIN 100 MG PO CAPS
100.0000 mg | ORAL_CAPSULE | Freq: Three times a day (TID) | ORAL | Status: DC
  Administered 2017-04-20 – 2017-04-23 (×8): 100 mg via ORAL
  Filled 2017-04-20 (×14): qty 1

## 2017-04-20 NOTE — Progress Notes (Signed)
Adult Psychoeducational Group Note  Date:  04/19/2017 Time:  2100  Group Topic/Focus:  Wrap-Up Group:   The focus of this group is to help patients review their daily goal of treatment and discuss progress on daily workbooks.  Participation Level:  Active  Participation Quality:  Attentive  Affect:  Appropriate  Cognitive:  Appropriate  Insight: Appropriate  Engagement in Group:  Engaged  Modes of Intervention:  Discussion  Additional Comments:  Patient attended group and said that his day was a 5.  Patient was asked by tech if he was a leader in [employer] and he said yeas he was.  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 [place] Coach Felicia Bloomquist 4/88/8916, 1:11 AM

## 2017-04-20 NOTE — [facility] Group Notes (Signed)
[place] LCSW Group Therapy Note  Date/Time:  04/20/2017 10:00-11:00AM  Type of Therapy and Topic:  Group Therapy:  Healthy and Unhealthy Supports  Participation Level:  Did Not Attend   Description of Group:  Patients in this group were introduced to the idea of adding a variety of healthy supports to address the various needs in their lives. The picture on the front of Sunday's workbook was used to demonstrate why more supports are needed in every patient's life.  Patients identified and described healthy supports versus unhealthy supports in general, then gave examples of each in their own lives.   They discussed what additional healthy supports could be helpful in their recovery and wellness after discharge in order to prevent future hospitalizations.   An emphasis was placed on using counselor, doctor, therapy groups, 12-step groups, and problem-specific support groups to expand supports.  They also worked as a group on developing a specific plan for several patients to deal with unhealthy supports through [place], psychoeducation with loved ones, and even termination of relationships.   Therapeutic Goals:   1)  discuss importance of adding supports to stay well once out of the hospital  2)  compare healthy versus unhealthy supports and identify some examples of each  3)  generate ideas and descriptions of healthy supports that can be added  4)  offer mutual support about how to address unhealthy supports  5)  encourage active participation in and adherence to discharge plan    Summary of Patient Progress:  N/A - stated he had a "Massive Headaches" and could not attend   Therapeutic Modalities:   Motivational Interviewing Brief Solution-Focused Therapy  Selmer Domin[place], LCSW
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
## 2017-04-20 NOTE — Progress Notes (Signed)
D:  Patient's self inventory sheet, patient has fair sleep, no sleep medication given.  Fair appetite, normal energy level.  Rated depression and hopeless 8, anxiety 9.  Denied withdrawals.  SI, sometimes, contracts for safety.  .  Physical problems, lightheaded, dizziness, headaches.  Physical pain, chest, head.  Pain medication helpful yes/no.  Goal is self esteem.  Plans to meet new people.  Does have discharge plans.  Needs to work on depression, anxiety. A:   Medications administered per MD orders.  Emotional support and encouragement. R:  Denied stated he does have SI thoughts this morning, no plans, contracts for safety.  Denied HI.  Does see demons who are telling him that he is worthless.

## 2017-04-20 NOTE — Progress Notes (Addendum)
[facility] MD Progress Note  04/20/2017 2:07 PM Derek Chen  MRN:  [id number] Subjective:   57 y.o Caucasian male, recently separated, moved in with his mother, on disability. Background history of MDD recurrent and multiple medical issues. Presented to the ER via the EMS. Called for help because he was having overwhelming suicidal thoughts. While in the ambulance developed chest pain. Main stressor is separation from his wife three weeks ago. He has been hearing a very critical voice since then. EKG was significant for RBBB. He evaluated by cardiology and managed at the ER prior to transfer. No substances on board.  Chart reviewed today. Patient discussed at team today.  Staff reports that he skipped some groups. He reports headache that is not relieved by his current medications. He has been using some over the counter supplement at home. He has not voiced any suicidal thoughts. He has been interactive with peers. Objectively has been observed to be in a lot of discomfort. He has been adherent with her medications so far.  Seen today, reports headache. Feels like a band around his head and comes towards his neck. He wakes up with the headache. No photophobia. Not worse with noise. No lacrimation or rhinorrhea. Patient states that he slept better last night. He is still ruminating on his relationship. Says he spoke with his wife briefly. He has been in contact with his sister too. His sister has been taking care of his mother. Patient tells me he is here and would do anything to get better. No suicidal thoughts. No homicidal thoughts. Voices that criticizes him are less intense. No side effects from his medications.   Principal Problem: MDD (major depressive disorder), recurrent, severe, with psychosis ([place]) Diagnosis:   Patient Active Problem List   Diagnosis Date Noted  . MDD (major depressive disorder), recurrent, severe, with psychosis ([place]) [F33.3] 04/18/2017  . Suicidal ideations [R45.851]  04/16/2017  . Atypical chest pain [R07.89] 11/15/2015  . Hypokalemia [E87.6] 09/15/2015  . Chest pain [R07.9] 09/15/2015  . Insomnia [G47.00] 09/15/2015  . Depression [F32.9] 09/15/2015  . 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 Non-insulin dependent type 2 diabetes mellitus ([place]) [E11.9]   . Erectile dysfunction associated with type 2 diabetes mellitus ([place]) [E11.69, N52.1] 01/21/2014  . COPD, moderate ([place]) [J44.9] 07/15/2013  . Diabetes ([place]) [E11.9] 11/06/2012  . BPH (benign prostatic hyperplasia) [N40.0] 11/06/2012  . 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 Palpitations [R00.2] 03/29/2012  . Dyspnea and respiratory abnormality [R06.00, R06.89] 03/29/2012  . Breath shortness [R06.02] 03/29/2012  . Acute myocardial infarction, subendocardial infarction ([place]) [I21.4] 03/27/2012  . Heart disease [I51.9] 03/27/2012  . Hepatitis B antibody positive [R76.8] 03/27/2012  . Coronary atherosclerosis [I25.10] 03/27/2012  . Acute non-ST segment elevation myocardial infarction ([place]) [I21.4] 03/27/2012  . Left ventricular dysfunction [I51.9] 03/27/2012  . Anxiety [F41.9] 03/27/2012  . Essential hypertension, benign [I10]   . Basal cell carcinoma [C44.91]   . Coronary atherosclerosis of native coronary artery [I25.10]   . Hyperlipidemia with target LDL less than 100 [E78.5] 04/19/2010   Total Time spent with patient: 20 minutes  Past Psychiatric History: As in H&P  Past Medical History:  Past Medical History:  Diagnosis Date  . Anxiety   . Arthritis   . Basal cell cancer    Chest and back - status post skin grafting  . BPH (benign prostatic hyperplasia)   . Cardiomyopathy (HCC)    LVEF 45-50%  . COPD (  chronic obstructive pulmonary disease) ([place])   . Coronary artery disease    NSTEMI - DES mid LAD December 2011 (patent December 2013)  . Dyslipidemia   . Essential hypertension, benign   . GERD (gastroesophageal  reflux disease)   . Hepatitis B antibody positive 2006  . History of kidney stones   . MI (myocardial infarction) ([place])    3 MI's last in 2016.  Marland Kitchen PONV (postoperative nausea and vomiting)    pt states that zofran and scope patch did not help PONV after last surgery.  . Sleep apnea   . Type 2 diabetes mellitus ([place]) 2012  . Viral pneumonia, unspecified 2004    Past Surgical History:  Procedure Laterality Date  . APPENDECTOMY    . COLONOSCOPY N/A 08/10/2015   Procedure: COLONOSCOPY;  Surgeon: Rogene Houston, MD;  Location: [facility] ENDO SUITE;  Service: Endoscopy;  Laterality: N/A;  730  . CORONARY STENT PLACEMENT    . FINGER SURGERY Right    index, gnagrene in finger.  Marland Kitchen KNEE ARTHROSCOPY     Left  . LEFT HEART CATHETERIZATION WITH CORONARY ANGIOGRAM N/A 03/20/2012   Procedure: LEFT HEART CATHETERIZATION WITH CORONARY ANGIOGRAM;  Surgeon: Thayer Headings, MD;  Location: [facility] CATH LAB;  Service: Cardiovascular;  Laterality: N/A;  . MASS EXCISION N/A 08/23/2016   Procedure: EXCISION BASAL CELL CARCINOMA ON BACK OVER 4 CM;  Surgeon: Aviva Signs, MD;  Location: [facility] ORS;  Service: General;  Laterality: N/A;  . PORT-A-CATH REMOVAL    . PORTACATH PLACEMENT    . SKIN GRAFT SPLIT THICKNESS TRUNK    01/23/2010    Right chest;  Felicie Morn, M.D.  . VASECTOMY     Family History:  Family History  Problem Relation Age of Onset  . Diabetes Mother   . Deep vein thrombosis Mother   . Pulmonary embolism Mother   . Cancer Father        hodgins lymphoma  . Diabetes Sister    Family Psychiatric  History: As in H&P Social History:  Social History   Substance and Sexual Activity  Alcohol Use Yes  . Alcohol/week: 1.0 - 1.5 oz  . Types: 2 - 3 Standard drinks or equivalent per week   Comment: Drinks occasional beer and wine     Social History   Substance and Sexual Activity  Drug Use No    Social History   Socioeconomic History  . Marital status: Married    Spouse name: None  . Number  of children: 2  . Years of education: None  . Highest education level: None  Social Needs  . Financial resource strain: None  . Food insecurity - worry: None  . Food insecurity - inability: None  . Transportation needs - medical: None  . Transportation needs - non-medical: None  Occupational History  . Occupation: IT trainer    Comment: [employer]  Tobacco Use  . Smoking status: Current Every Day Smoker    Packs/day: 0.25    Years: 35.00    Pack years: 8.75    Types: Cigarettes  . Smokeless tobacco: Former Systems developer    Types: [place] date: 08/22/2006  Substance and Sexual Activity  . Alcohol use: Yes    Alcohol/week: 1.0 - 1.5 oz    Types: 2 - 3 Standard drinks or equivalent per week    Comment: Drinks occasional beer and wine  . Drug use: No  . Sexual activity: Yes    Birth  control/protection: None  Other Topics Concern  . None  Social History Narrative  . None   Additional Social History:                         Sleep: Good  Appetite:  Good  Current Medications: Current Facility-Administered Medications  Medication Dose Route Frequency Provider Last Rate Last Dose  . albuterol (PROVENTIL HFA;VENTOLIN HFA) 108 (90 Base) MCG/ACT inhaler 1-2 puff  1-2 puff Inhalation Daily PRN Rankin, Shuvon B, NP      . alum & mag hydroxide-simeth (MAALOX/MYLANTA) 200-200-20 MG/5ML suspension 30 mL  30 mL Oral Q4H PRN Rankin, Shuvon B, NP      . aspirin EC tablet 81 mg  81 mg Oral Daily Rankin, Shuvon B, NP   81 mg at 04/20/17 0750  . butalbital-acetaminophen-caffeine (FIORICET, ESGIC) 50-325-40 MG per tablet 1 tablet  1 tablet Oral Q6H PRN Artist Beach, MD   1 tablet at 04/20/17 0802  . clopidogrel (PLAVIX) tablet 75 mg  75 mg Oral Daily Rankin, Shuvon B, NP   75 mg at 04/20/17 0750  . DULoxetine (CYMBALTA) DR capsule 90 mg  90 mg Oral Daily Tavoris Brisk, Laruth Bouchard, MD   90 mg at 04/20/17 0751  . fluticasone (FLONASE) 50 MCG/ACT nasal spray 2 spray  2  spray Each Nare Daily PRN Rankin, Shuvon B, NP      . glimepiride (AMARYL) tablet 4 mg  4 mg Oral Q breakfast Rankin, Shuvon B, NP   4 mg at 04/20/17 0751  . hydrOXYzine (ATARAX/VISTARIL) tablet 50 mg  50 mg Oral Q6H PRN Artist Beach, MD   50 mg at 04/20/17 0802  . isosorbide mononitrate (IMDUR) 24 hr tablet 60 mg  60 mg Oral Daily Rankin, Shuvon B, NP   60 mg at 04/20/17 0751  . linagliptin (TRADJENTA) tablet 5 mg  5 mg Oral Daily Rankin, Shuvon B, NP   5 mg at 04/20/17 0752  . lisinopril (PRINIVIL,ZESTRIL) tablet 2.5 mg  2.5 mg Oral Daily Rankin, Shuvon B, NP   2.5 mg at 04/20/17 0752  . magnesium hydroxide (MILK OF MAGNESIA) suspension 30 mL  30 mL Oral Daily PRN Rankin, Shuvon B, NP      . metFORMIN (GLUCOPHAGE) tablet 1,000 mg  1,000 mg Oral BID WC Rankin, Shuvon B, NP   1,000 mg at 04/20/17 0753  . metoprolol succinate (TOPROL-XL) 24 hr tablet 12.5 mg  12.5 mg Oral Daily Rankin, Shuvon B, NP   12.5 mg at 04/20/17 0753  . mirtazapine (REMERON) tablet 15 mg  15 mg Oral QHS Dorothymae Maciver, Laruth Bouchard, MD   15 mg at 04/19/17 2055  . nicotine (NICODERM CQ - dosed in mg/24 hours) patch 21 mg  21 mg Transdermal Daily Inell Mimbs, Laruth Bouchard, MD   21 mg at 04/20/17 0756  . nitroGLYCERIN (NITROSTAT) SL tablet 0.4 mg  0.4 mg Sublingual Q5 min PRN Rankin, Shuvon B, NP      . OLANZapine (ZYPREXA) tablet 5 mg  5 mg Oral QHS Lygia Olaes, Laruth Bouchard, MD   5 mg at 04/19/17 2054  . pantoprazole (PROTONIX) EC tablet 40 mg  40 mg Oral Daily Rankin, Shuvon B, NP   40 mg at 04/20/17 0754  . promethazine (PHENERGAN) tablet 25 mg  25 mg Oral BID PRN Rankin, Shuvon B, NP   25 mg at 04/19/17 1808  . rosuvastatin (CRESTOR) tablet 10 mg  10 mg Oral Daily Rankin, Shuvon B, NP  10 mg at 04/20/17 0754  . tamsulosin (FLOMAX) capsule 0.4 mg  0.4 mg Oral Daily Rankin, Shuvon B, NP   0.4 mg at 04/20/17 0754    Lab Results: No results found for this or any previous visit (from the past 48 hour(s)).  Blood Alcohol level:  Lab  Results  Component Value Date   ETH <10 04/16/2017   ETH 76 (H) 29/51/8841    Metabolic Disorder Labs: Lab Results  Component Value Date   HGBA1C 5.8 (H) 11/17/2015   MPG 120 11/17/2015   MPG 137 09/15/2015   No results found for: PROLACTIN Lab Results  Component Value Date   CHOL 142 09/16/2015   TRIG 112 09/16/2015   HDL 36 (L) 09/16/2015   CHOLHDL 3.9 09/16/2015   VLDL 22 09/16/2015   LDLCALC 84 09/16/2015   LDLCALC 139 (H) 11/10/2013    Physical Findings: AIMS: Facial and Oral Movements Muscles of Facial Expression: None, normal Lips and Perioral Area: None, normal Jaw: None, normal Tongue: None, normal,Extremity Movements Upper (arms, wrists, hands, fingers): None, normal Lower (legs, knees, ankles, toes): None, normal, Trunk Movements Neck, shoulders, hips: None, normal, Overall Severity Severity of abnormal movements (highest score from questions above): None, normal Incapacitation due to abnormal movements: None, normal Patient's awareness of abnormal movements (rate only patient's report): No Awareness, Dental Status Current problems with teeth and/or dentures?: No Does patient usually wear dentures?: No  CIWA:    COWS:     Musculoskeletal: Strength & Muscle Tone: within normal limits Gait & Station: normal Patient leans: N/A  Psychiatric Specialty Exam: Physical Exam  Constitutional: He appears well-developed and well-nourished.  HENT:  Head: Normocephalic and atraumatic.  Respiratory: Effort normal.  Neurological: He is alert.  Psychiatric:  As above.    ROS  Blood pressure 107/74, pulse 86, temperature 97.6 F (36.4 C), resp. rate 20, height 5\' 11"  (1.803 m), weight 73 kg (161 lb).Body mass index is 22.45 kg/m.  General Appearance: Neatly dressed. Was sleeping just prior to interview. Calm and does not appear to be in distress. Polite and relates well.   Eye Contact:  Good  Speech:  Clear and Coherent and Normal Rate  Volume:  Normal  Mood:   Worried   Affect:  Congruent  Thought Process:  Linear  Orientation:  Full (Time, Place, and Person)  Thought Content:  Negative ruminations. No delusional theme. No preoccupation with violent thoughts. No hallucination in any modality.   Suicidal Thoughts:  No  Homicidal Thoughts:  No  Memory:  Immediate;   Good Recent;   Good Remote;   Good  Judgement:  Fair  Insight:  Good  Psychomotor Activity:  Decreased  Concentration:  Concentration: Good and Attention Span: Good  Recall:  Good  Fund of Knowledge:  Good  Language:  Good  Akathisia:  Negative  Handed:    AIMS (if indicated):     Assets:  Communication Skills Desire for Improvement Financial Resources/Insurance Resilience  ADL's:  Intact  Cognition:  WNL  Sleep:  Number of Hours: 5.5     Treatment Plan Summary: Patient is worried about his relationship. Has associated tension headache. We have agreed to add Gabapentin to his current regimen. He consented to treatment after we reviewed the risks and benefits. We are yet to get collateral from his wife. We would evaluate him further.   Psychiatric: MDD Recurrent Adjustment Disorder  Medical: CAD HTN DM HLD Migraine Tension headache  Psychosocial:  Medical issues Recent separation New  role as caretaker   PLAN: 1. Gabapentin 100 mg TID.  2. Continue other medications at current dose 3. SW would obtain collateral from his wife.  4. Continue to encourage unit groups and therapeutic activity    Artist Beach, MD 04/20/2017, 2:07 PM

## 2017-04-20 NOTE — Progress Notes (Addendum)
  DATA ACTION RESPONSE  Objective- Pt. is visible in the dayroom, seen watching TV. Presents with a depressed/anxious affect and mood. Pt appears preoccupied with medications. Pt c/o of insomnia and ongoing headache this evening.  Subjective- Denies having any SI/HI/VH at this time. +AH. Rates pain 8/10; headache. Is cooperative and remain safe on the unit.  1:1 interaction in private to establish rapport. Encouragement, education, & support given from staff.  PRN vistrail and fioricet requested and will re-eval accordingly.   Safety maintained with Q 15 checks. Continue with POC.

## 2017-04-20 NOTE — Plan of Care (Signed)
Nurse discussed anxiety, depression, coping skills with patient. 

## 2017-04-21 MED ORDER — DULOXETINE HCL 60 MG PO CPEP
120.0000 mg | ORAL_CAPSULE | Freq: Every day | ORAL | Status: DC
  Administered 2017-04-22 – 2017-04-23 (×2): 120 mg via ORAL
  Filled 2017-04-21 (×4): qty 2

## 2017-04-21 NOTE — BHH Group Notes (Signed)
[facility] LCSW Group Therapy Note  Date/Time: 04/21/17, 1315  Type of Therapy and Topic:  Group Therapy:  Overcoming Obstacles  Participation Level:  moderate  Description of Group:    In this group patients will be encouraged to explore what they see as obstacles to their own wellness and recovery. They will be guided to discuss their thoughts, feelings, and behaviors related to these obstacles. The group will process together ways to cope with barriers, with attention given to specific choices patients can make. Each patient will be challenged to identify changes they are motivated to make in order to overcome their obstacles. This group will be process-oriented, with patients participating in exploration of their own experiences as well as giving and receiving support and challenge from other group members.  Therapeutic Goals: 1. Patient will identify personal and current obstacles as they relate to admission. 2. Patient will identify barriers that currently interfere with their wellness or overcoming obstacles.  3. Patient will identify feelings, thought process and behaviors related to these barriers. 4. Patient will identify two changes they are willing to make to overcome these obstacles:    Summary of Patient Progress: Pt shared that anxiety and depression were current obstacles in his life.  Pt showed good participation in group discussion about keeping a mindset of making steps towards improving situation with regards to obstacles in life.      Therapeutic Modalities:   Cognitive Behavioral Therapy Solution Focused Therapy Motivational Interviewing Relapse Prevention Therapy  Lurline Idol, LCSW

## 2017-04-21 NOTE — Progress Notes (Signed)
Patient shared with the group that he had a good start to his day and that he was having a bad evening. He is complaining of having a lot of anxiety despite enjoying the daytime groups. He explained in a superficial manner that he was upset that his doctor had made a mistake with his paper work. As for the theme of the day, his wellness strategy will focus on trying to quit smoking.

## 2017-04-21 NOTE — [facility] Suicide Risk Assessment (Signed)
[facility] INPATIENT:  Family/Significant Other Suicide Prevention Education  Suicide Prevention Education:  Family/Significant Other Refusal to Support Patient after Discharge:  Suicide Prevention Education Not Provided:  Patient has identified home of family/significant other as the place the patient will be residing after discharge.  With written consent of the patient, two attempts were made to provide Suicide Prevention Education to Sheryle Spray, wife, [phone]  This person indicates he/she will not be responsible for the patient after discharge.   Joelene Millin reports that she kicked him out of her home on 04/02/17.  He is on probation for stealing $40,000 from his mother over a number of years and she finally realized that he is not going to change.  He has stolen from her as well to the point that she had to file bankruptcy.  He is on probation and she spoke with his PO recently who said he has 37 felonies on his record, mainly related to ATM theft from his mother.  Pt cannot return to her home and she is not going to [place] herself with him.  Joanne Chars, LCSW 04/21/2017,3:05 PM

## 2017-04-21 NOTE — Progress Notes (Signed)
D:  Patient's self inventory sheet, patient has fair sleep, sleep medication not helpful.  Fair appetite, normal energy level, good/poor concentration.  Rated depression and hopeless 8, anxiety 9.  Denied withdrawals.  SI, sometimes.  Physical problem, lightheaded, dizziness, headaches.  Denied physical pain.  Pain medication some.  Goal is anxiety, depression.  Plans to stay active.  No discharge plans. A:  Medications administered per MD orders.  Emotional support and encouragement given patient. R:  Patient stated he does have SI thoughts, contracts for safety, no plan.  Denied HI.  Stated he does see little demons this morning.  Demons tell him he is worthless, no one cares. Safety maintained with 15 minute checks.

## 2017-04-21 NOTE — Progress Notes (Signed)
Recreation Therapy Notes  Date: 04/21/17 Time: 0930 Location: 300 Hall Dayroom  Group Topic: Stress Management  Goal Area(s) Addresses:  Patient will verbalize importance of using healthy stress management.  Patient will identify positive emotions associated with healthy stress management.   Intervention: Stress Management  Activity :  Meditation.  LRT introduced the stress management technique of meditation.  LRT played a meditation from the Calm app dealing with forgiveness of self.  Patients were to follow along with the meditation as it played to engage in the activity.  Education:  Stress Management, Discharge Planning.   Education Outcome: Acknowledges edcuation/In group clarification offered/Needs additional education  Clinical Observations/Feedback: Pt did not attend group.     Victorino Sparrow, LRT/CTRS         Victorino Sparrow A 04/21/2017 12:19 PM

## 2017-04-21 NOTE — Progress Notes (Addendum)
Pt approached Probation officer and demanded that he see a LCSW. Pt states "I need him to call my wife and tell her that I am not doing well. I am very depressed. I am anxious and I am still having these terrible migraines. I am not ready for discharge". Writer is currently irritable/agaiated/angry in affect and mood. Writer told Pt that LCSW will back in the morning. Per Pt, Pt requested that Writer called Estanislado Spire (Pt's wife) @ [phone] to provide an update on Pt's status. Emotional support provided. Pt was encourage to speak to MD tomorrow. Pt continues to voice c/o on migraines, anxiety, and depression.

## 2017-04-21 NOTE — Plan of Care (Signed)
Nurse discussed depression, anxiety, coping skills with patient.  

## 2017-04-21 NOTE — Tx Team (Signed)
Interdisciplinary Treatment and Diagnostic Plan Update  04/21/2017 Time of Session: 0950 Derek Chen MRN: [id number]  Principal Diagnosis: MDD (major depressive disorder), recurrent, severe, with psychosis ([place])  Secondary Diagnoses: Principal Problem:   MDD (major depressive disorder), recurrent, severe, with psychosis ([place])   Current Medications:  Current Facility-Administered Medications  Medication Dose Route Frequency Provider Last Rate Last Dose  . albuterol (PROVENTIL HFA;VENTOLIN HFA) 108 (90 Base) MCG/ACT inhaler 1-2 puff  1-2 puff Inhalation Daily PRN Rankin, Shuvon B, NP      . alum & mag hydroxide-simeth (MAALOX/MYLANTA) 200-200-20 MG/5ML suspension 30 mL  30 mL Oral Q4H PRN Rankin, Shuvon B, NP      . aspirin EC tablet 81 mg  81 mg Oral Daily Rankin, Shuvon B, NP   81 mg at 04/21/17 0822  . butalbital-acetaminophen-caffeine (FIORICET, ESGIC) 50-325-40 MG per tablet 1 tablet  1 tablet Oral Q6H PRN Artist Beach, MD   1 tablet at 04/21/17 0828  . clopidogrel (PLAVIX) tablet 75 mg  75 mg Oral Daily Rankin, Shuvon B, NP   75 mg at 04/21/17 0998  . DULoxetine (CYMBALTA) DR capsule 90 mg  90 mg Oral Daily Izediuno, Laruth Bouchard, MD   90 mg at 04/21/17 0823  . fluticasone (FLONASE) 50 MCG/ACT nasal spray 2 spray  2 spray Each Nare Daily PRN Rankin, Shuvon B, NP      . gabapentin (NEURONTIN) capsule 100 mg  100 mg Oral TID Artist Beach, MD   100 mg at 04/21/17 1203  . glimepiride (AMARYL) tablet 4 mg  4 mg Oral Q breakfast Rankin, Shuvon B, NP   4 mg at 04/21/17 0823  . hydrOXYzine (ATARAX/VISTARIL) tablet 50 mg  50 mg Oral Q6H PRN Artist Beach, MD   50 mg at 04/21/17 0828  . isosorbide mononitrate (IMDUR) 24 hr tablet 60 mg  60 mg Oral Daily Rankin, Shuvon B, NP   60 mg at 04/21/17 3382  . linagliptin (TRADJENTA) tablet 5 mg  5 mg Oral Daily Rankin, Shuvon B, NP   5 mg at 04/21/17 0823  . lisinopril (PRINIVIL,ZESTRIL) tablet 2.5 mg  2.5 mg Oral Daily Rankin, Shuvon  B, NP   2.5 mg at 04/21/17 0834  . magnesium hydroxide (MILK OF MAGNESIA) suspension 30 mL  30 mL Oral Daily PRN Rankin, Shuvon B, NP      . metFORMIN (GLUCOPHAGE) tablet 1,000 mg  1,000 mg Oral BID WC Rankin, Shuvon B, NP   1,000 mg at 04/21/17 0824  . metoprolol succinate (TOPROL-XL) 24 hr tablet 12.5 mg  12.5 mg Oral Daily Rankin, Shuvon B, NP   12.5 mg at 04/21/17 0824  . mirtazapine (REMERON) tablet 15 mg  15 mg Oral QHS Izediuno, Vincent A, MD   15 mg at 04/20/17 2112  . nicotine (NICODERM CQ - dosed in mg/24 hours) patch 21 mg  21 mg Transdermal Daily Izediuno, Laruth Bouchard, MD   21 mg at 04/21/17 0831  . nitroGLYCERIN (NITROSTAT) SL tablet 0.4 mg  0.4 mg Sublingual Q5 min PRN Rankin, Shuvon B, NP      . OLANZapine (ZYPREXA) tablet 5 mg  5 mg Oral QHS Izediuno, Laruth Bouchard, MD   5 mg at 04/20/17 2112  . pantoprazole (PROTONIX) EC tablet 40 mg  40 mg Oral Daily Rankin, Shuvon B, NP   40 mg at 04/21/17 0824  . promethazine (PHENERGAN) tablet 25 mg  25 mg Oral BID PRN Rankin, Shuvon B, NP   25 mg  at 04/20/17 1738  . rosuvastatin (CRESTOR) tablet 10 mg  10 mg Oral Daily Rankin, Shuvon B, NP   10 mg at 04/21/17 6962  . tamsulosin (FLOMAX) capsule 0.4 mg  0.4 mg Oral Daily Rankin, Shuvon B, NP   0.4 mg at 04/21/17 9528   PTA Medications: Medications Prior to Admission  Medication Sig Dispense Refill Last Dose  . albuterol (PROVENTIL) (2.5 MG/3ML) 0.083% nebulizer solution Take 3 mLs (2.5 mg total) by nebulization every 6 (six) hours as needed for wheezing or shortness of breath. 75 mL 3 Past Week at Unknown time  . Albuterol Sulfate 108 (90 Base) MCG/ACT AEPB Inhale 1-2 puffs into the lungs daily as needed (for shortness of breath/wheezing).    Past Month at Unknown time  . aspirin EC 81 MG EC tablet Take 1 tablet (81 mg total) by mouth daily.     . budesonide (PULMICORT) 0.5 MG/2ML nebulizer solution Take 0.5 mg by nebulization daily as needed for shortness of breath/wheezing   Past Week at Unknown  time  . Cinnamon 500 MG capsule Take 1,000 mg by mouth daily.    04/16/2017 at Unknown time  . clonazePAM (KLONOPIN) 1 MG tablet Take 1 mg by mouth 2 (two) times daily as needed for anxiety.   Past Month at Unknown time  . clopidogrel (PLAVIX) 75 MG tablet Take 1 tablet (75 mg total) by mouth daily. 90 tablet 0 04/16/2017 at Unknown time  . DULoxetine (CYMBALTA) 60 MG capsule Take 1 capsule (60 mg total) by mouth daily. 90 capsule 1 04/16/2017 at Unknown time  . fluticasone (FLONASE) 50 MCG/ACT nasal spray Place 2 sprays into both nostrils daily as needed for allergies or rhinitis.    Past Week at Unknown time  . glimepiride (AMARYL) 4 MG tablet Take 4 mg by mouth daily with breakfast.   04/16/2017 at Unknown time  . isosorbide mononitrate (IMDUR) 60 MG 24 hr tablet Take 1 tablet (60 mg total) by mouth daily. 30 tablet 0   . lisinopril (PRINIVIL,ZESTRIL) 2.5 MG tablet Take 1 tablet (2.5 mg total) by mouth daily. 30 tablet 0   . metFORMIN (GLUCOPHAGE) 1000 MG tablet Take 1,000 mg by mouth 2 (two) times daily.    04/16/2017 at Unknown time  . metoprolol succinate (TOPROL-XL) 25 MG 24 hr tablet Take 0.5 tablets (12.5 mg total) by mouth daily. 30 tablet 0   . nitroGLYCERIN (NITROSTAT) 0.4 MG SL tablet Place 0.4 mg under the tongue every 5 (five) minutes as needed for chest pain.   04/16/2017 at Unknown time  . pantoprazole (PROTONIX) 40 MG tablet Take 1 tablet (40 mg total) by mouth daily. 90 tablet 1 04/16/2017 at Unknown time  . promethazine (PHENERGAN) 25 MG tablet Take 1 tablet (25 mg total) by mouth 2 (two) times daily as needed for nausea or vomiting. 20 tablet 0 Past Month at Unknown time  . rosuvastatin (CRESTOR) 10 MG tablet Take 1 tablet (10 mg total) by mouth daily. 90 tablet 1 04/16/2017 at Unknown time  . sitaGLIPtin (JANUVIA) 100 MG tablet Take 100 mg by mouth daily.   04/16/2017 at Unknown time  . tamsulosin (FLOMAX) 0.4 MG CAPS capsule Take 1 capsule (0.4 mg total) by mouth daily. 90 capsule 3  04/16/2017 at Unknown time    Patient Stressors: Health problems Marital or family conflict  Patient Strengths: Ability for insight Average or above average intelligence Capable of independent living [employer] of knowledge Motivation for treatment/growth Supportive family/friends  Treatment Modalities:  Medication Management, Group therapy, Case management,  1 to 1 session with clinician, Psychoeducation, Recreational therapy.   Physician Treatment Plan for Primary Diagnosis: MDD (major depressive disorder), recurrent, severe, with psychosis ([place]) Long Term Goal(s): Improvement in symptoms so as ready for discharge Improvement in symptoms so as ready for discharge   Short Term Goals: Ability to identify changes in lifestyle to reduce recurrence of condition will improve Ability to verbalize feelings will improve Ability to disclose and discuss suicidal ideas Ability to demonstrate self-control will improve Ability to identify and develop effective coping behaviors will improve Ability to maintain clinical measurements within normal limits will improve Compliance with prescribed medications will improve Ability to identify changes in lifestyle to reduce recurrence of condition will improve Ability to verbalize feelings will improve Ability to disclose and discuss suicidal ideas Ability to demonstrate self-control will improve Ability to identify and develop effective coping behaviors will improve Ability to maintain clinical measurements within normal limits will improve Compliance with prescribed medications will improve  Medication Management: Evaluate patient's response, side effects, and tolerance of medication regimen.  Therapeutic Interventions: 1 to 1 sessions, Unit Group sessions and Medication administration.  Evaluation of Outcomes: Progressing  Physician Treatment Plan for Secondary Diagnosis: Principal Problem:   MDD (major depressive disorder), recurrent,  severe, with psychosis ([place])  Long Term Goal(s): Improvement in symptoms so as ready for discharge Improvement in symptoms so as ready for discharge   Short Term Goals: Ability to identify changes in lifestyle to reduce recurrence of condition will improve Ability to verbalize feelings will improve Ability to disclose and discuss suicidal ideas Ability to demonstrate self-control will improve Ability to identify and develop effective coping behaviors will improve Ability to maintain clinical measurements within normal limits will improve Compliance with prescribed medications will improve Ability to identify changes in lifestyle to reduce recurrence of condition will improve Ability to verbalize feelings will improve Ability to disclose and discuss suicidal ideas Ability to demonstrate self-control will improve Ability to identify and develop effective coping behaviors will improve Ability to maintain clinical measurements within normal limits will improve Compliance with prescribed medications will improve     Medication Management: Evaluate patient's response, side effects, and tolerance of medication regimen.  Therapeutic Interventions: 1 to 1 sessions, Unit Group sessions and Medication administration.  Evaluation of Outcomes: Progressing   RN Treatment Plan for Primary Diagnosis: MDD (major depressive disorder), recurrent, severe, with psychosis ([place]) Long Term Goal(s): Knowledge of disease and therapeutic regimen to maintain health will improve  Short Term Goals: Ability to identify and develop effective coping behaviors will improve and Compliance with prescribed medications will improve  Medication Management: RN will administer medications as ordered by provider, will assess and evaluate patient's response and provide education to patient for prescribed medication. RN will report any adverse and/or side effects to prescribing provider.  Therapeutic Interventions: 1 on 1  counseling sessions, Psychoeducation, Medication administration, Evaluate responses to treatment, Monitor vital signs and CBGs as ordered, Perform/monitor CIWA, COWS, AIMS and Fall Risk screenings as ordered, Perform wound care treatments as ordered.  Evaluation of Outcomes: Progressing   LCSW Treatment Plan for Primary Diagnosis: MDD (major depressive disorder), recurrent, severe, with psychosis ([place]) Long Term Goal(s): Safe transition to appropriate next level of care at discharge, Engage patient in therapeutic group addressing interpersonal concerns.  Short Term Goals: Engage patient in aftercare planning with referrals and resources, Increase social support and Increase skills for wellness and recovery  Therapeutic Interventions: Assess for all discharge  needs, 1 to 1 time with Education officer, museum, Explore available resources and support systems, Assess for adequacy in community support network, Educate family and significant other(s) on suicide prevention, Complete Psychosocial Assessment, Interpersonal group therapy.  Evaluation of Outcomes: Progressing   Progress in Treatment: Attending groups: Yes. Participating in groups: Yes. Taking medication as prescribed: Yes. Toleration medication: Yes. Family/Significant other contact made: No, will contact:  wife Patient understands diagnosis: Yes. Discussing patient identified problems/goals with staff: Yes. Medical problems stabilized or resolved: Yes. Denies suicidal/homicidal ideation: Yes. Issues/concerns per patient self-inventory: No. Other: none  New problem(s) identified: No, Describe:  none  New Short Term/Long Term Goal(s):Pt goal: improve my anxiety, depression  Discharge Plan or Barriers:   Reason for Continuation of Hospitalization: Depression Medication stabilization  Estimated Length of Stay:3-5 days.  Attendees: Patient:Derek Chen 04/21/2017   Physician: Dr Sanjuana Letters, MD 04/21/2017   Nursing: Grayland Ormond, RN  04/21/2017   RN Care Manager: 04/21/2017   Social Worker: Lurline Idol, LCSW 04/21/2017   Recreational Therapist:  04/21/2017   Other:  04/21/2017   Other:  04/21/2017   Other: 04/21/2017        Scribe for Treatment Team: Joanne Chars, LCSW 04/21/2017 12:11 PM

## 2017-04-21 NOTE — Progress Notes (Signed)
[facility] MD Progress Note  04/21/2017 12:00 PM GUHAN BRUINGTON  MRN:  [id number] Subjective:   57 y.o Caucasian male, recently separated, moved in with his mother, on disability. Background history of MDD recurrent and multiple medical issues. Presented to the ER via the EMS. Called for help because he was having overwhelming suicidal thoughts. While in the ambulance developed chest pain. Main stressor is separation from his wife three weeks ago. He has been hearing a very critical voice since then. EKG was significant for RBBB. He evaluated by cardiology and managed at the ER prior to transfer. No substances on board.  Chart reviewed today. Patient discussed at team today. SW would contact his family today and gather collateral from his wife.   Staff reports that he has been focused on medications. He complains of insomnia though he slept well. Less somatic overall. Some group participation. He has not voiced any futility thoughts. He has not been observed to be internally stimulated.   Seen today, he was at the gymn this morning. Did ten lapse around and felt a bit more relaxed. He shed more light on his legal problems. Says he carried out a financial fraud against his mother. The banks alerted her. He has been ordered by the court to pay back about $13,000. Says he worked for the [employer], trade [employer]. He retired due to health reasons. Says his wife was recently told he was fired because he stole from the department. Patient says everything came to the surface five weeks ago. He hopes they can work things out again. Says he talks to all his family everyday. He continues to have these thoughts that he is a mean person. Has images of the demon looking him in the eyes while criticizing him. Patient states that he is still worried and anxious. Headache is slightly less today. No overt evidence of psychosis. No suicidal thoughts. No homicidal thoughts. No thoughts of violence.   Principal Problem: MDD (major  depressive disorder), recurrent, severe, with psychosis ([place]) Diagnosis:   Patient Active Problem List   Diagnosis Date Noted  . MDD (major depressive disorder), recurrent, severe, with psychosis ([place]) [F33.3] 04/18/2017  . Suicidal ideations [R45.851] 04/16/2017  . Atypical chest pain [R07.89] 11/15/2015  . Hypokalemia [E87.6] 09/15/2015  . Chest pain [R07.9] 09/15/2015  . Insomnia [G47.00] 09/15/2015  . Depression [F32.9] 09/15/2015  . 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 Non-insulin dependent type 2 diabetes mellitus ([place]) [E11.9]   . Erectile dysfunction associated with type 2 diabetes mellitus ([place]) [E11.69, N52.1] 01/21/2014  . COPD, moderate ([place]) [J44.9] 07/15/2013  . Diabetes ([place]) [E11.9] 11/06/2012  . BPH (benign prostatic hyperplasia) [N40.0] 11/06/2012  . 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 Palpitations [R00.2] 03/29/2012  . Dyspnea and respiratory abnormality [R06.00, R06.89] 03/29/2012  . Breath shortness [R06.02] 03/29/2012  . Acute myocardial infarction, subendocardial infarction ([place]) [I21.4] 03/27/2012  . Heart disease [I51.9] 03/27/2012  . Hepatitis B antibody positive [R76.8] 03/27/2012  . Coronary atherosclerosis [I25.10] 03/27/2012  . Acute non-ST segment elevation myocardial infarction ([place]) [I21.4] 03/27/2012  . Left ventricular dysfunction [I51.9] 03/27/2012  . Anxiety [F41.9] 03/27/2012  . Essential hypertension, benign [I10]   . Basal cell carcinoma [C44.91]   . Coronary atherosclerosis of native coronary artery [I25.10]   . Hyperlipidemia with target LDL less than 100 [E78.5] 04/19/2010   Total Time spent with patient: 20 minutes  Past Psychiatric History: As in H&P  Past Medical History:  Past  Medical History:  Diagnosis Date  . Anxiety   . Arthritis   . Basal cell cancer    Chest and back - status post skin grafting  . BPH (benign prostatic hyperplasia)   .  Cardiomyopathy (HCC)    LVEF 45-50%  . COPD (chronic obstructive pulmonary disease) ([place])   . Coronary artery disease    NSTEMI - DES mid LAD December 2011 (patent December 2013)  . Dyslipidemia   . Essential hypertension, benign   . GERD (gastroesophageal reflux disease)   . Hepatitis B antibody positive 2006  . History of kidney stones   . MI (myocardial infarction) ([place])    3 MI's last in 2016.  Marland Kitchen PONV (postoperative nausea and vomiting)    pt states that zofran and scope patch did not help PONV after last surgery.  . Sleep apnea   . Type 2 diabetes mellitus ([place]) 2012  . Viral pneumonia, unspecified 2004    Past Surgical History:  Procedure Laterality Date  . APPENDECTOMY    . COLONOSCOPY N/A 08/10/2015   Procedure: COLONOSCOPY;  Surgeon: Rogene Houston, MD;  Location: [facility] ENDO SUITE;  Service: Endoscopy;  Laterality: N/A;  730  . CORONARY STENT PLACEMENT    . FINGER SURGERY Right    index, gnagrene in finger.  Marland Kitchen KNEE ARTHROSCOPY     Left  . LEFT HEART CATHETERIZATION WITH CORONARY ANGIOGRAM N/A 03/20/2012   Procedure: LEFT HEART CATHETERIZATION WITH CORONARY ANGIOGRAM;  Surgeon: Thayer Headings, MD;  Location: [facility] CATH LAB;  Service: Cardiovascular;  Laterality: N/A;  . MASS EXCISION N/A 08/23/2016   Procedure: EXCISION BASAL CELL CARCINOMA ON BACK OVER 4 CM;  Surgeon: Aviva Signs, MD;  Location: [facility] ORS;  Service: General;  Laterality: N/A;  . PORT-A-CATH REMOVAL    . PORTACATH PLACEMENT    . SKIN GRAFT SPLIT THICKNESS TRUNK    01/23/2010    Right chest;  Felicie Morn, M.D.  . VASECTOMY     Family History:  Family History  Problem Relation Age of Onset  . Diabetes Mother   . Deep vein thrombosis Mother   . Pulmonary embolism Mother   . Cancer Father        hodgins lymphoma  . Diabetes Sister    Family Psychiatric  History: As in H&P Social History:  Social History   Substance and Sexual Activity  Alcohol Use Yes  . Alcohol/week: 1.0 - 1.5 oz  . Types: 2  - 3 Standard drinks or equivalent per week   Comment: Drinks occasional beer and wine     Social History   Substance and Sexual Activity  Drug Use No    Social History   Socioeconomic History  . Marital status: Married    Spouse name: None  . Number of children: 2  . Years of education: None  . Highest education level: None  Social Needs  . Financial resource strain: None  . Food insecurity - worry: None  . Food insecurity - inability: None  . Transportation needs - medical: None  . Transportation needs - non-medical: None  Occupational History  . Occupation: IT trainer    Comment: [employer]  Tobacco Use  . Smoking status: Current Every Day Smoker    Packs/day: 0.25    Years: 35.00    Pack years: 8.75    Types: Cigarettes  . Smokeless tobacco: Former Systems developer    Types: [place] date: 08/22/2006  Substance and Sexual Activity  .  Alcohol use: Yes    Alcohol/week: 1.0 - 1.5 oz    Types: 2 - 3 Standard drinks or equivalent per week    Comment: Drinks occasional beer and wine  . Drug use: No  . Sexual activity: Yes    Birth control/protection: None  Other Topics Concern  . None  Social History Narrative  . None   Additional Social History:      Sleep: Good  Appetite:  Good  Current Medications: Current Facility-Administered Medications  Medication Dose Route Frequency Provider Last Rate Last Dose  . albuterol (PROVENTIL HFA;VENTOLIN HFA) 108 (90 Base) MCG/ACT inhaler 1-2 puff  1-2 puff Inhalation Daily PRN Rankin, Shuvon B, NP      . alum & mag hydroxide-simeth (MAALOX/MYLANTA) 200-200-20 MG/5ML suspension 30 mL  30 mL Oral Q4H PRN Rankin, Shuvon B, NP      . aspirin EC tablet 81 mg  81 mg Oral Daily Rankin, Shuvon B, NP   81 mg at 04/21/17 0822  . butalbital-acetaminophen-caffeine (FIORICET, ESGIC) 50-325-40 MG per tablet 1 tablet  1 tablet Oral Q6H PRN Artist Beach, MD   1 tablet at 04/21/17 0828  . clopidogrel (PLAVIX) tablet 75 mg   75 mg Oral Daily Rankin, Shuvon B, NP   75 mg at 04/21/17 3267  . DULoxetine (CYMBALTA) DR capsule 90 mg  90 mg Oral Daily Erielle Gawronski, Laruth Bouchard, MD   90 mg at 04/21/17 0823  . fluticasone (FLONASE) 50 MCG/ACT nasal spray 2 spray  2 spray Each Nare Daily PRN Rankin, Shuvon B, NP      . gabapentin (NEURONTIN) capsule 100 mg  100 mg Oral TID Artist Beach, MD   100 mg at 04/21/17 1245  . glimepiride (AMARYL) tablet 4 mg  4 mg Oral Q breakfast Rankin, Shuvon B, NP   4 mg at 04/21/17 0823  . hydrOXYzine (ATARAX/VISTARIL) tablet 50 mg  50 mg Oral Q6H PRN Artist Beach, MD   50 mg at 04/21/17 0828  . isosorbide mononitrate (IMDUR) 24 hr tablet 60 mg  60 mg Oral Daily Rankin, Shuvon B, NP   60 mg at 04/21/17 8099  . linagliptin (TRADJENTA) tablet 5 mg  5 mg Oral Daily Rankin, Shuvon B, NP   5 mg at 04/21/17 0823  . lisinopril (PRINIVIL,ZESTRIL) tablet 2.5 mg  2.5 mg Oral Daily Rankin, Shuvon B, NP   2.5 mg at 04/21/17 0834  . magnesium hydroxide (MILK OF MAGNESIA) suspension 30 mL  30 mL Oral Daily PRN Rankin, Shuvon B, NP      . metFORMIN (GLUCOPHAGE) tablet 1,000 mg  1,000 mg Oral BID WC Rankin, Shuvon B, NP   1,000 mg at 04/21/17 0824  . metoprolol succinate (TOPROL-XL) 24 hr tablet 12.5 mg  12.5 mg Oral Daily Rankin, Shuvon B, NP   12.5 mg at 04/21/17 0824  . mirtazapine (REMERON) tablet 15 mg  15 mg Oral QHS Shunda Rabadi A, MD   15 mg at 04/20/17 2112  . nicotine (NICODERM CQ - dosed in mg/24 hours) patch 21 mg  21 mg Transdermal Daily Alixandrea Milleson, Laruth Bouchard, MD   21 mg at 04/21/17 0831  . nitroGLYCERIN (NITROSTAT) SL tablet 0.4 mg  0.4 mg Sublingual Q5 min PRN Rankin, Shuvon B, NP      . OLANZapine (ZYPREXA) tablet 5 mg  5 mg Oral QHS Alonzo Owczarzak, Laruth Bouchard, MD   5 mg at 04/20/17 2112  . pantoprazole (PROTONIX) EC tablet 40 mg  40 mg Oral Daily  Rankin, Shuvon B, NP   40 mg at 04/21/17 0824  . promethazine (PHENERGAN) tablet 25 mg  25 mg Oral BID PRN Rankin, Shuvon B, NP   25 mg at 04/20/17  1738  . rosuvastatin (CRESTOR) tablet 10 mg  10 mg Oral Daily Rankin, Shuvon B, NP   10 mg at 04/21/17 7829  . tamsulosin (FLOMAX) capsule 0.4 mg  0.4 mg Oral Daily Rankin, Shuvon B, NP   0.4 mg at 04/21/17 5621    Lab Results: No results found for this or any previous visit (from the past 48 hour(s)).  Blood Alcohol level:  Lab Results  Component Value Date   ETH <10 04/16/2017   ETH 76 (H) 30/86/5784    Metabolic Disorder Labs: Lab Results  Component Value Date   HGBA1C 5.8 (H) 11/17/2015   MPG 120 11/17/2015   MPG 137 09/15/2015   No results found for: PROLACTIN Lab Results  Component Value Date   CHOL 142 09/16/2015   TRIG 112 09/16/2015   HDL 36 (L) 09/16/2015   CHOLHDL 3.9 09/16/2015   VLDL 22 09/16/2015   LDLCALC 84 09/16/2015   LDLCALC 139 (H) 11/10/2013    Physical Findings: AIMS: Facial and Oral Movements Muscles of Facial Expression: None, normal Lips and Perioral Area: None, normal Jaw: None, normal Tongue: None, normal,Extremity Movements Upper (arms, wrists, hands, fingers): None, normal Lower (legs, knees, ankles, toes): None, normal, Trunk Movements Neck, shoulders, hips: None, normal, Overall Severity Severity of abnormal movements (highest score from questions above): None, normal Incapacitation due to abnormal movements: None, normal Patient's awareness of abnormal movements (rate only patient's report): No Awareness, Dental Status Current problems with teeth and/or dentures?: No Does patient usually wear dentures?: No  CIWA:  CIWA-Ar Total: 2 COWS:  COWS Total Score: 3  Musculoskeletal: Strength & Muscle Tone: within normal limits Gait & Station: normal Patient leans: N/A  Psychiatric Specialty Exam: Physical Exam  Constitutional: He appears well-developed and well-nourished.  HENT:  Head: Normocephalic and atraumatic.  Respiratory: Effort normal.  Neurological: He is alert.  Psychiatric:  As above.    ROS  Blood pressure 129/73,  pulse 83, temperature 97.7 F (36.5 C), temperature source Oral, resp. rate 16, height 5\' 11"  (1.803 m), weight 73 kg (161 lb).Body mass index is 22.45 kg/m.  General Appearance: Neatly dressed. Polite, engages well. Does not appear internally distracted.   Eye Contact:  Good  Speech:  Spontaneous. Decreased rate and tone  Volume:  Soft spoken  Mood:  Worried   Affect:  Restricted and mood congruent.   Thought Process:  Linear  Orientation:  Full (Time, Place, and Person)  Thought Content:  Negative ruminations. No delusional theme. No preoccupation with violent thoughts. No hallucination in any modality.   Suicidal Thoughts:  No  Homicidal Thoughts:  No  Memory:  Immediate;   Good Recent;   Good Remote;   Good  Judgement:  Fair  Insight:  Good  Psychomotor Activity:  Decreased  Concentration:  Concentration: Good and Attention Span: Good  Recall:  Good  Fund of Knowledge:  Good  Language:  Good  Akathisia:  Negative  Handed:    AIMS (if indicated):     Assets:  Communication Skills Desire for Improvement Financial Resources/Insurance Resilience  ADL's:  Intact  Cognition:  WNL  Sleep:  Number of Hours: 6.75     Treatment Plan Summary: Patient continues to ruminate about his marital issues and legal issues. He is less somatically focused today.  He wants help to resolve his crisis. I do not believe that he is psychotic. We increased Duloxetine to 90 mg at presentation. We added Mirtazapine and low dose Olanzapine. I plan to adjust Duloxetine further today. We would hopefully gather collateral from his wife today. We would evaluate him further.     Psychiatric: MDD Recurrent Adjustment Disorder  Medical: CAD HTN DM HLD Migraine Tension headache  Psychosocial:  Medical issues Recent separation New role as caretaker   PLAN: 1. Increase Gabapentin 200 mg TID.  2. Increase Duloxetine to 120 mg daily 3. SW would obtain collateral from his wife.  4. Continue to  encourage unit groups and therapeutic activity    Artist Beach, MD 04/21/2017, 12:00 PMPatient ID: Derek Chen, male   DOB: [birth year], 57 y.o.   MRN: [id number]

## 2017-04-21 NOTE — Progress Notes (Signed)
Adult Psychoeducational Group Note  Date:  04/21/2017 Time: 1600 Group Topic/Focus:  Goals Group:   The focus of this group is to help patients establish daily goals to achieve during treatment and discuss how the patient can incorporate goal setting into their daily lives to aide in recovery.  Participation Level:  Active  Participation Quality:  Appropriate  Affect:  Appropriate  Cognitive:  Appropriate  Insight: Appropriate  Engagement in Group:  Engaged  Modes of Intervention:  Education  Additional Comments:

## 2017-04-21 NOTE — Progress Notes (Signed)
Patient stated "Little demons in my head trying to fight their way out."

## 2017-04-21 NOTE — Progress Notes (Signed)
DATA ACTION RESPONSE  Objective- Pt. is visible in the dayroom, seen watching TV. Presents with an irritable/anger/agitated/anxious affect and mood. Pt appears preoccupied with making phone call to wife.Pt states he needs to speak to MD tomorrow. Pt c/o of insomnia, headache, anxiety, and nausea this evening. Pt appears manipulative and is attention-seeking with staff members.  Subjective- Denies having any SI/HI/VH at this time. +AH. Rates pain 8/10; headache. Is cooperative and remains safe on the unit.  1:1 interaction in private to establish rapport. Encouragement, education, & support given from staff.  PRN vistrail, phenergan, and fioricet requested and will re-eval accordingly.   Safety maintained with Q 15 checks. Continue with POC.

## 2017-04-22 DIAGNOSIS — F419 Anxiety disorder, unspecified: Secondary | ICD-10-CM

## 2017-04-22 DIAGNOSIS — R443 Hallucinations, unspecified: Secondary | ICD-10-CM

## 2017-04-22 DIAGNOSIS — R450 Nervousness: Secondary | ICD-10-CM

## 2017-04-22 MED ORDER — OLANZAPINE 7.5 MG PO TABS
7.5000 mg | ORAL_TABLET | Freq: Every day | ORAL | Status: DC
  Administered 2017-04-22: 7.5 mg via ORAL
  Filled 2017-04-22 (×4): qty 1

## 2017-04-22 NOTE — Progress Notes (Signed)
Pt asked to speak with CSW and asked "what CSW had told my wife?"  CSW talked with pt for several minutes, pt saying that his wife was upset due to something CSW had told her.  CSW told pt that I did complete SPE with his wife and that she did appear upset and stated that pt would not be allowed to return to her home due to stealing money from her and from his mother multiple times in large amounts.  Pt asked CSW to again call his wife to verify that he had actually had an RN call her last night and not a pt pretending to be an Therapist, sports.  CSW declined and steered the conversation back to pt plan since he was not going to be able to return home to wife.  Pt said he will plan to stay with his mother.  Pt continues to want CSW to call his wife and tell her different things. Winferd Humphrey, MSW, LCSW Clinical Social Worker 04/22/2017 12:38 PM

## 2017-04-22 NOTE — Progress Notes (Signed)
[facility] MD Progress Note  04/22/2017 3:03 PM Derek Chen  MRN:  [id number]   Subjective:  Patient reports that he is very anxious today and he thinks its because he was taken off oh his Ativan when he got to [facility]. He holds his hand up and its shaking. He reports that he is having AVH of voices telling him that he is worthless and useless, he has visions of small demons and he states he saw them today in the day room. He also reports having a dream of someone standing over him and was trying to stab him and it woke him up and he hasn't been able to go back to sleep since. Patient denies SI/HI and contracts for safety.    Objective: Patient's chart and findings reviewed and discussed with treatment team. Patient is cooperative. He is given a MMSE and scores 26/30. He does not appear to be having any withdrawal symptoms as it has been 6 days at the [facility] without benzo's and was negative on admission as well. Will increase Zyprexa to 7.5 mg QHS. Also to note patient's shaking stopped when he was concentrating ion doing the MMSE.    Principal Problem: MDD (major depressive disorder), recurrent, severe, with psychosis ([place]) Diagnosis:   Patient Active Problem List   Diagnosis Date Noted  . MDD (major depressive disorder), recurrent, severe, with psychosis ([place]) [F33.3] 04/18/2017  . Suicidal ideations [R45.851] 04/16/2017  . Atypical chest pain [R07.89] 11/15/2015  . Hypokalemia [E87.6] 09/15/2015  . Chest pain [R07.9] 09/15/2015  . Insomnia [G47.00] 09/15/2015  . Depression [F32.9] 09/15/2015  . 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 Non-insulin dependent type 2 diabetes mellitus ([place]) [E11.9]   . Erectile dysfunction associated with type 2 diabetes mellitus ([place]) [E11.69, N52.1] 01/21/2014  . COPD, moderate ([place]) [J44.9] 07/15/2013  . Diabetes ([place]) [E11.9] 11/06/2012  . BPH (benign prostatic hyperplasia) [N40.0] 11/06/2012  . Secondary  cardiomyopathy, unspecified [I42.9] 09/27/2012  . Palpitations [R00.2] 03/29/2012  . Dyspnea and respiratory abnormality [R06.00, R06.89] 03/29/2012  . Breath shortness [R06.02] 03/29/2012  . Acute myocardial infarction, subendocardial infarction ([place]) [I21.4] 03/27/2012  . Heart disease [I51.9] 03/27/2012  . Hepatitis B antibody positive [R76.8] 03/27/2012  . Coronary atherosclerosis [I25.10] 03/27/2012  . Acute non-ST segment elevation myocardial infarction ([place]) [I21.4] 03/27/2012  . Left ventricular dysfunction [I51.9] 03/27/2012  . Anxiety [F41.9] 03/27/2012  . Essential hypertension, benign [I10]   . Basal cell carcinoma [C44.91]   . Coronary atherosclerosis of native coronary artery [I25.10]   . Hyperlipidemia with target LDL less than 100 [E78.5] 04/19/2010   Total Time spent with patient: 25 minutes  Past Psychiatric History: See H&P  Past Medical History:  Past Medical History:  Diagnosis Date  . Anxiety   . Arthritis   . Basal cell cancer    Chest and back - status post skin grafting  . BPH (benign prostatic hyperplasia)   . Cardiomyopathy (HCC)    LVEF 45-50%  . COPD (chronic obstructive pulmonary disease) ([place])   . Coronary artery disease    NSTEMI - DES mid LAD December 2011 (patent December 2013)  . Dyslipidemia   . Essential hypertension, benign   . GERD (gastroesophageal reflux disease)   . Hepatitis B antibody positive 2006  . History of kidney stones   . MI (myocardial infarction) ([place])    3 MI's last in 2016.  Marland Kitchen PONV (postoperative nausea and vomiting)    pt states that  zofran and scope patch did not help PONV after last surgery.  . Sleep apnea   . Type 2 diabetes mellitus ([place]) 2012  . Viral pneumonia, unspecified 2004    Past Surgical History:  Procedure Laterality Date  . APPENDECTOMY    . COLONOSCOPY N/A 08/10/2015   Procedure: COLONOSCOPY;  Surgeon: Rogene Houston, MD;  Location: [facility] ENDO SUITE;  Service: Endoscopy;  Laterality: N/A;  730  .  CORONARY STENT PLACEMENT    . FINGER SURGERY Right    index, gnagrene in finger.  Marland Kitchen KNEE ARTHROSCOPY     Left  . LEFT HEART CATHETERIZATION WITH CORONARY ANGIOGRAM N/A 03/20/2012   Procedure: LEFT HEART CATHETERIZATION WITH CORONARY ANGIOGRAM;  Surgeon: Thayer Headings, MD;  Location: [facility] CATH LAB;  Service: Cardiovascular;  Laterality: N/A;  . MASS EXCISION N/A 08/23/2016   Procedure: EXCISION BASAL CELL CARCINOMA ON BACK OVER 4 CM;  Surgeon: Aviva Signs, MD;  Location: [facility] ORS;  Service: General;  Laterality: N/A;  . PORT-A-CATH REMOVAL    . PORTACATH PLACEMENT    . SKIN GRAFT SPLIT THICKNESS TRUNK    01/23/2010    Right chest;  Felicie Morn, M.D.  . VASECTOMY     Family History:  Family History  Problem Relation Age of Onset  . Diabetes Mother   . Deep vein thrombosis Mother   . Pulmonary embolism Mother   . Cancer Father        hodgins lymphoma  . Diabetes Sister    Family Psychiatric  History: See H&P Social History:  Social History   Substance and Sexual Activity  Alcohol Use Yes  . Alcohol/week: 1.0 - 1.5 oz  . Types: 2 - 3 Standard drinks or equivalent per week   Comment: Drinks occasional beer and wine     Social History   Substance and Sexual Activity  Drug Use No    Social History   Socioeconomic History  . Marital status: Married    Spouse name: None  . Number of children: 2  . Years of education: None  . Highest education level: None  Social Needs  . Financial resource strain: None  . Food insecurity - worry: None  . Food insecurity - inability: None  . Transportation needs - medical: None  . Transportation needs - non-medical: None  Occupational History  . Occupation: IT trainer    Comment: [place]  Tobacco Use  . Smoking status: Current Every Day Smoker    Packs/day: 0.25    Years: 35.00    Pack years: 8.75    Types: Cigarettes  . Smokeless tobacco: Former Systems developer    Types: [place] date: 08/22/2006  Substance  and Sexual Activity  . Alcohol use: Yes    Alcohol/week: 1.0 - 1.5 oz    Types: 2 - 3 Standard drinks or equivalent per week    Comment: Drinks occasional beer and wine  . Drug use: No  . Sexual activity: Yes    Birth control/protection: None  Other Topics Concern  . None  Social History Narrative  . None   Additional Social History:                         Sleep: Fair  Appetite:  Good  Current Medications: Current Facility-Administered Medications  Medication Dose Route Frequency Provider Last Rate Last Dose  . albuterol (PROVENTIL HFA;VENTOLIN HFA) 108 (90 Base) MCG/ACT inhaler 1-2 puff  1-2 puff  Inhalation Daily PRN Rankin, Shuvon B, NP      . alum & mag hydroxide-simeth (MAALOX/MYLANTA) 200-200-20 MG/5ML suspension 30 mL  30 mL Oral Q4H PRN Rankin, Shuvon B, NP      . aspirin EC tablet 81 mg  81 mg Oral Daily Rankin, Shuvon B, NP   81 mg at 04/22/17 1104  . butalbital-acetaminophen-caffeine (FIORICET, ESGIC) 50-325-40 MG per tablet 1 tablet  1 tablet Oral Q6H PRN Artist Beach, MD   1 tablet at 04/22/17 1113  . clopidogrel (PLAVIX) tablet 75 mg  75 mg Oral Daily Rankin, Shuvon B, NP   75 mg at 04/22/17 1105  . DULoxetine (CYMBALTA) DR capsule 120 mg  120 mg Oral Daily Izediuno, Laruth Bouchard, MD   120 mg at 04/22/17 1103  . fluticasone (FLONASE) 50 MCG/ACT nasal spray 2 spray  2 spray Each Nare Daily PRN Rankin, Shuvon B, NP      . gabapentin (NEURONTIN) capsule 100 mg  100 mg Oral TID Artist Beach, MD   100 mg at 04/22/17 1105  . glimepiride (AMARYL) tablet 4 mg  4 mg Oral Q breakfast Rankin, Shuvon B, NP   4 mg at 04/22/17 1104  . hydrOXYzine (ATARAX/VISTARIL) tablet 50 mg  50 mg Oral Q6H PRN Artist Beach, MD   50 mg at 04/22/17 1113  . isosorbide mononitrate (IMDUR) 24 hr tablet 60 mg  60 mg Oral Daily Rankin, Shuvon B, NP   60 mg at 04/22/17 1104  . linagliptin (TRADJENTA) tablet 5 mg  5 mg Oral Daily Rankin, Shuvon B, NP   5 mg at 04/22/17 1104  .  lisinopril (PRINIVIL,ZESTRIL) tablet 2.5 mg  2.5 mg Oral Daily Rankin, Shuvon B, NP   2.5 mg at 04/22/17 1103  . magnesium hydroxide (MILK OF MAGNESIA) suspension 30 mL  30 mL Oral Daily PRN Rankin, Shuvon B, NP      . metFORMIN (GLUCOPHAGE) tablet 1,000 mg  1,000 mg Oral BID WC Rankin, Shuvon B, NP   1,000 mg at 04/22/17 1103  . metoprolol succinate (TOPROL-XL) 24 hr tablet 12.5 mg  12.5 mg Oral Daily Rankin, Shuvon B, NP   12.5 mg at 04/22/17 1106  . mirtazapine (REMERON) tablet 15 mg  15 mg Oral QHS Izediuno, Vincent A, MD   15 mg at 04/21/17 2113  . nicotine (NICODERM CQ - dosed in mg/24 hours) patch 21 mg  21 mg Transdermal Daily Izediuno, Laruth Bouchard, MD   21 mg at 04/22/17 1105  . nitroGLYCERIN (NITROSTAT) SL tablet 0.4 mg  0.4 mg Sublingual Q5 min PRN Rankin, Shuvon B, NP      . OLANZapine (ZYPREXA) tablet 7.5 mg  7.5 mg Oral QHS Money, Lowry Ram, FNP      . pantoprazole (PROTONIX) EC tablet 40 mg  40 mg Oral Daily Rankin, Shuvon B, NP   40 mg at 04/22/17 1106  . promethazine (PHENERGAN) tablet 25 mg  25 mg Oral BID PRN Rankin, Shuvon B, NP   25 mg at 04/22/17 1109  . rosuvastatin (CRESTOR) tablet 10 mg  10 mg Oral Daily Rankin, Shuvon B, NP   10 mg at 04/22/17 1104  . tamsulosin (FLOMAX) capsule 0.4 mg  0.4 mg Oral Daily Rankin, Shuvon B, NP   0.4 mg at 04/22/17 1106    Lab Results: No results found for this or any previous visit (from the past 48 hour(s)).  Blood Alcohol level:  Lab Results  Component Value Date   ETH <  10 04/16/2017   ETH 76 (H) 02/58/5277    Metabolic Disorder Labs: Lab Results  Component Value Date   HGBA1C 5.8 (H) 11/17/2015   MPG 120 11/17/2015   MPG 137 09/15/2015   No results found for: PROLACTIN Lab Results  Component Value Date   CHOL 142 09/16/2015   TRIG 112 09/16/2015   HDL 36 (L) 09/16/2015   CHOLHDL 3.9 09/16/2015   VLDL 22 09/16/2015   LDLCALC 84 09/16/2015   LDLCALC 139 (H) 11/10/2013    Physical Findings: AIMS: Facial and Oral  Movements Muscles of Facial Expression: None, normal Lips and Perioral Area: None, normal Jaw: None, normal Tongue: None, normal,Extremity Movements Upper (arms, wrists, hands, fingers): None, normal Lower (legs, knees, ankles, toes): None, normal, Trunk Movements Neck, shoulders, hips: None, normal, Overall Severity Severity of abnormal movements (highest score from questions above): None, normal Incapacitation due to abnormal movements: None, normal Patient's awareness of abnormal movements (rate only patient's report): No Awareness, Dental Status Current problems with teeth and/or dentures?: No Does patient usually wear dentures?: No  CIWA:  CIWA-Ar Total: 1 COWS:  COWS Total Score: 2  Musculoskeletal: Strength & Muscle Tone: within normal limits Gait & Station: normal Patient leans: N/A  Psychiatric Specialty Exam: Physical Exam  Nursing note and vitals reviewed. Constitutional: He is oriented to person, place, and time. He appears well-developed and well-nourished.  Cardiovascular: Normal rate.  Musculoskeletal: Normal range of motion.  Neurological: He is alert and oriented to person, place, and time.  Skin: Skin is warm.    Review of Systems  Constitutional: Negative.   HENT: Negative.   Eyes: Negative.   Respiratory: Negative.   Cardiovascular: Negative.   Gastrointestinal: Negative.   Genitourinary: Negative.   Musculoskeletal: Negative.   Skin: Negative.   Neurological: Negative.   Endo/Heme/Allergies: Negative.   Psychiatric/Behavioral: Positive for depression and hallucinations. Negative for suicidal ideas. The patient is nervous/anxious.     Blood pressure 126/76, pulse 86, temperature 97.7 F (36.5 C), temperature source Oral, resp. rate 16, height 5\' 11"  (1.803 m), weight 73 kg (161 lb).Body mass index is 22.45 kg/m.  General Appearance: Casual  Eye Contact:  Good  Speech:  Clear and Coherent and Normal Rate  Volume:  Normal  Mood:  Anxious  Affect:   Flat  Thought Process:  Goal Directed and Descriptions of Associations: Intact  Orientation:  Full (Time, Place, and Person)  Thought Content:  Hallucinations: Auditory Visual  Suicidal Thoughts:  No  Homicidal Thoughts:  No  Memory:  Immediate;   Good Recent;   Good Remote;   Good  Judgement:  Fair  Insight:  Fair  Psychomotor Activity:  Normal  Concentration:  Concentration: Good and Attention Span: Good  Recall:  Good  Fund of Knowledge:  Good  Language:  Good  Akathisia:  No  Handed:  Right  AIMS (if indicated):     Assets:  Communication Skills Desire for Improvement Financial Resources/Insurance Housing  ADL's:  Intact  Cognition:  WNL  Sleep:  Number of Hours: 6.5   Problems Addressed: MDD severe  Treatment Plan Summary: Daily contact with patient to assess and evaluate symptoms and progress in treatment, Medication management and Plan is to:  -Increase Zyprexa 7.5 mg PO QHS for mood stability -Continue Cymbalta 120 mg PO Daily for mood stability -Continue Gabapentin 100 mg PO TID for agitation -Continue Vistaril 50 mg PO Q6H PRN for anxiety -Continue Remeron 15 mg PO QHS for mood stability -Encourage group therapy  participation  Lewis Shock, FNP 04/22/2017, 3:03 PM   Agree with NP Progress Note

## 2017-04-22 NOTE — Progress Notes (Signed)
Recreation Therapy Notes  Animal-Assisted Activity (AAA) Program Checklist/Progress Notes Patient Eligibility Criteria Checklist & Daily Group note for Rec TxIntervention  Date: 01.22.2019 Time: 2:45pm Location: [address]   AAA/T Program Assumption of Risk Form signed by Patient/ or Parent Legal Guardian Yes  Patient is free of allergies or sever asthma Yes  Patient reports no fear of animals Yes  Patient reports no history of cruelty to animals Yes  Patient understands his/her participation is voluntary Yes  Behavioral Response: Did not attend.   Laureen Ochs Kammie Scioli, LRT/CTRS         Maxwell Martorano L 04/22/2017 4:02 PM

## 2017-04-22 NOTE — Progress Notes (Signed)
DATA ACTION RESPONSE  Objective-Pt. is visible in the dayroom, seenwatching TV.Presents with an anxious/depressed affect and mood.Pt c/o of insomnia, headache, anxiety, and nausea this evening. Pt appears manipulative and is attention-seeking with staff members.Pt states he would medications readjusted.  Subjective-Denies having any SI/HI/VH at this time. +AH.Rates pain 8/10; headache.Is cooperative and remains safe on the unit.  1:1 interaction in private to establish rapport. Encouragement, education, &support given from staff. PRN vistrail, phenergan, and fioricetrequested and will re-eval accordingly.  Safety maintained with Q 15 checks. Continue with POC.

## 2017-04-22 NOTE — BHH Group Notes (Signed)
Pt attended group activity on mindfulness. Pt was alert and engaged in group exercise.

## 2017-04-22 NOTE — Progress Notes (Signed)
Patient denies SI, HI and AVH.  Patient has attended groups engaged in unit activities and been in no behavioral dyscontrol.   Assess patient for safety, offer medications as prescribed, and engage patient in 1:1 staff talks.   Patient able to contract for safety, continue to monitor as prescribed.

## 2017-04-23 DIAGNOSIS — R440 Auditory hallucinations: Secondary | ICD-10-CM

## 2017-04-23 MED ORDER — GABAPENTIN 100 MG PO CAPS
200.0000 mg | ORAL_CAPSULE | Freq: Three times a day (TID) | ORAL | Status: DC
  Administered 2017-04-23 – 2017-04-24 (×3): 200 mg via ORAL
  Filled 2017-04-23: qty 2
  Filled 2017-04-23: qty 42
  Filled 2017-04-23 (×2): qty 2
  Filled 2017-04-23 (×2): qty 42
  Filled 2017-04-23 (×3): qty 2

## 2017-04-23 MED ORDER — OLANZAPINE 10 MG PO TABS
10.0000 mg | ORAL_TABLET | Freq: Every day | ORAL | Status: DC
  Administered 2017-04-23: 10 mg via ORAL
  Filled 2017-04-23 (×2): qty 1
  Filled 2017-04-23: qty 7

## 2017-04-23 MED ORDER — DULOXETINE HCL 60 MG PO CPEP
60.0000 mg | ORAL_CAPSULE | Freq: Two times a day (BID) | ORAL | Status: DC
  Administered 2017-04-24: 60 mg via ORAL
  Filled 2017-04-23: qty 1
  Filled 2017-04-23: qty 14
  Filled 2017-04-23 (×2): qty 1
  Filled 2017-04-23: qty 14

## 2017-04-23 NOTE — BHH Group Notes (Signed)
Adult Psychoeducational Group Note  Date:  04/23/2017 Time:  11:40 PM  Group Topic/Focus:  Wrap-Up Group:   The focus of this group is to help patients review their daily goal of treatment and discuss progress on daily workbooks.  Participation Level:  Active  Participation Quality:  Appropriate and Attentive  Affect:  Appropriate  Cognitive:  Alert and Appropriate  Insight: Appropriate and Good  Engagement in Group:  Engaged  Modes of Intervention:  Discussion and Education  Additional Comments:  Pt attended and participated in wrap up group this evening. The pt had a pretty good day because they got to see the doctor and they now have new prescribed meds, and they also talked a lot to the other hall mates. Pt goal was to continue to feel happy and a positive noted by the pt was that they got to see the Dr.   Cristi Loron 04/23/2017, 11:40 PM

## 2017-04-23 NOTE — BHH Group Notes (Signed)
Adult Psychoeducational Group Note  Date:  04/23/2017 Time:  12:23 AM  Group Topic/Focus:  Wrap-Up Group:   The focus of this group is to help patients review their daily goal of treatment and discuss progress on daily workbooks.  Participation Level:  Active  Participation Quality:  Appropriate and Attentive  Affect:  Appropriate  Cognitive:  Alert and Appropriate  Insight: Appropriate and Good  Engagement in Group:  Engaged  Modes of Intervention:  Discussion and Education  Additional Comments:  Pt attended and participated in wrap up group. Pt told writer that their day started off bad because they were nauseous, but the day started to get better once they started feeling better and they ate. Pt goal was to stay focused and to work on their anxiety. Talking to the other pt helps the pt keep their anxiety down. Something positive noted by the pt was that they were able to talk to the NP and they had a really good conversation with them, because they listened to their needs.   Cristi Loron 04/23/2017, 12:23 AM

## 2017-04-23 NOTE — Progress Notes (Signed)
Recreation Therapy Notes  Date: 04/23/17 Time: 0930 Location: [address] Dayroom  Group Topic: Stress Management  Goal Area(s) Addresses:  Patient will verbalize importance of using healthy stress management.  Patient will identify positive emotions associated with healthy stress management.   Intervention: Stress Management  Activity : Guided Imagery.  LRT introduced the stress management technique of guided imagery.  Patients were to follow along as LRT read a script on letting go of unnecessary baggage in order to embrace a new beginning.  Education: Stress Management, Discharge Planning.   Education Outcome: Acknowledges edcuation/In group clarification offered/Needs additional education  Clinical Observations/Feedback: Pt did not attend group.     Victorino Sparrow, LRT/CTRS          Victorino Sparrow A 04/23/2017 11:27 AM

## 2017-04-23 NOTE — Progress Notes (Addendum)
[facility] MD Progress Note  04/23/2017 11:53 AM Derek Chen  MRN:  [id number]   Subjective: patient reports some improvement compared to admission, but states it has been partial and relatively modest thus far - describes ongoing anxiety, sense of apprehension,depression, and intermittent visual hallucinations, which he describes as briefly  seeing a human figure that disappears after he closes his eyes. Denies medication side effects, other than " vivid dreams "but as above states he does not feel they are still not " working that much". Reports frequent headaches . Denies suicidal ideations   Objective: I have discussed case with treatment team and have met with patient. Patient is a 57 year old male, who recently separated and moved in with his mother, whose physical health is declining . He presented to hospital for depression, SI, and auditory hallucinations. At this time reports modest improvement- denies suicidal ideations, denies auditory hallucinations, but describes ongoing depression, sadness, significant anxiety, and intermittent visual hallucinations, which he states started a few weeks ago. Currently does not present internally preoccupied. Visible in group room, no disruptive behaviors on unit    Principal Problem: MDD (major depressive disorder), recurrent, severe, with psychosis (Derek Chen) Diagnosis:   Patient Active Problem List   Diagnosis Date Noted  . MDD (major depressive disorder), recurrent, severe, with psychosis (Derek Chen) [F33.3] 04/18/2017  . Suicidal ideations [R45.851] 04/16/2017  . Atypical Derek pain [R07.89] 11/15/2015  . Hypokalemia [E87.6] 09/15/2015  . Derek pain [R07.9] 09/15/2015  . Insomnia [G47.00] 09/15/2015  . Depression [F32.9] 09/15/2015  . 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 Non-insulin dependent type 2 diabetes mellitus (Derek Chen) [E11.9]   . Erectile dysfunction associated with type 2 diabetes mellitus  (Derek Chen) [E11.69, N52.1] 01/21/2014  . COPD, moderate (Derek Chen) [J44.9] 07/15/2013  . Diabetes (Derek Chen) [E11.9] 11/06/2012  . BPH (benign prostatic hyperplasia) [N40.0] 11/06/2012  . 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 Palpitations [R00.2] 03/29/2012  . Dyspnea and respiratory abnormality [R06.00, R06.89] 03/29/2012  . Breath shortness [R06.02] 03/29/2012  . Acute myocardial infarction, subendocardial infarction (Derek Chen) [I21.4] 03/27/2012  . Heart disease [I51.9] 03/27/2012  . Hepatitis B antibody positive [R76.8] 03/27/2012  . Coronary atherosclerosis [I25.10] 03/27/2012  . Acute non-ST segment elevation myocardial infarction (Derek Chen) [I21.4] 03/27/2012  . Left ventricular dysfunction [I51.9] 03/27/2012  . Anxiety [F41.9] 03/27/2012  . Essential hypertension, benign [I10]   . Basal cell carcinoma [C44.91]   . Coronary atherosclerosis of native coronary artery [I25.10]   . Hyperlipidemia with target LDL less than 100 [E78.5] 04/19/2010   Total Time spent with patient: 25 minutes  Past Psychiatric History: See H&P  Past Medical History:  Past Medical History:  Diagnosis Date  . Anxiety   . Arthritis   . Basal cell cancer    Derek and back - status post skin grafting  . BPH (benign prostatic hyperplasia)   . Cardiomyopathy (HCC)    LVEF 45-50%  . COPD (chronic obstructive pulmonary disease) (Derek Chen)   . Coronary artery disease    NSTEMI - DES mid LAD December 2011 (patent December 2013)  . Dyslipidemia   . Essential hypertension, benign   . GERD (gastroesophageal reflux disease)   . Hepatitis B antibody positive 2006  . History of kidney stones   . MI (myocardial infarction) (Derek Chen)    3 MI's last in 2016.  Marland Kitchen PONV (postoperative nausea and vomiting)    pt states that zofran and scope patch did not help PONV after last surgery.  Marland Kitchen  Sleep apnea   . Type 2 diabetes mellitus (Derek Chen) 2012  . Viral pneumonia, unspecified 2004    Past Surgical History:  Procedure Laterality  Date  . APPENDECTOMY    . COLONOSCOPY N/A 08/10/2015   Procedure: COLONOSCOPY;  Surgeon: Rogene Houston, MD;  Location: [facility] ENDO SUITE;  Service: Endoscopy;  Laterality: N/A;  730  . CORONARY STENT PLACEMENT    . FINGER SURGERY Right    index, gnagrene in finger.  Marland Kitchen KNEE ARTHROSCOPY     Left  . LEFT HEART CATHETERIZATION WITH CORONARY ANGIOGRAM N/A 03/20/2012   Procedure: LEFT HEART CATHETERIZATION WITH CORONARY ANGIOGRAM;  Surgeon: Thayer Headings, MD;  Location: [facility] CATH LAB;  Service: Cardiovascular;  Laterality: N/A;  . MASS EXCISION N/A 08/23/2016   Procedure: EXCISION BASAL CELL CARCINOMA ON BACK OVER 4 CM;  Surgeon: Derek Signs, MD;  Location: [facility] ORS;  Service: General;  Laterality: N/A;  . PORT-A-CATH REMOVAL    . PORTACATH PLACEMENT    . SKIN GRAFT SPLIT THICKNESS TRUNK    01/23/2010    Right Derek;  Derek Chen, M.D.  . VASECTOMY     Family History:  Family History  Problem Relation Age of Onset  . Diabetes Mother   . Deep vein thrombosis Mother   . Pulmonary embolism Mother   . Cancer Father        Derek Chen lymphoma  . Diabetes Sister    Family Psychiatric  History: See H&P Social History:  Social History   Substance and Sexual Activity  Alcohol Use Yes  . Alcohol/week: 1.0 - 1.5 oz  . Types: 2 - 3 Standard drinks or equivalent per week   Comment: Drinks occasional beer and wine     Social History   Substance and Sexual Activity  Drug Use No    Social History   Socioeconomic History  . Marital status: Married    Spouse name: None  . Number of children: 2  . Years of education: None  . Highest education level: None  Social Needs  . Financial resource strain: None  . Food insecurity - worry: None  . Food insecurity - inability: None  . Transportation needs - medical: None  . Transportation needs - non-medical: None  Occupational History  . Occupation: IT trainer    Comment: [employer]  Tobacco Use  . Smoking status: Current  Every Day Smoker    Packs/day: 0.25    Years: 35.00    Pack years: 8.75    Types: Cigarettes  . Smokeless tobacco: Former Systems developer    Types: [place] date: 08/22/2006  Substance and Sexual Activity  . Alcohol use: Yes    Alcohol/week: 1.0 - 1.5 oz    Types: 2 - 3 Standard drinks or equivalent per week    Comment: Drinks occasional beer and wine  . Drug use: No  . Sexual activity: Yes    Birth control/protection: None  Other Topics Concern  . None  Social History Narrative  . None   Additional Social History:   Sleep: Fair  Appetite:  Good  Current Medications: Current Facility-Administered Medications  Medication Dose Route Frequency Provider Last Rate Last Dose  . albuterol (PROVENTIL HFA;VENTOLIN HFA) 108 (90 Base) MCG/ACT inhaler 1-2 puff  1-2 puff Inhalation Daily PRN Rankin, Shuvon B, NP      . alum & mag hydroxide-simeth (MAALOX/MYLANTA) 200-200-20 MG/5ML suspension 30 mL  30 mL Oral Q4H PRN Rankin, Shuvon B, NP      .  aspirin EC tablet 81 mg  81 mg Oral Daily Rankin, Shuvon B, NP   81 mg at 04/23/17 0819  . butalbital-acetaminophen-caffeine (FIORICET, ESGIC) 50-325-40 MG per tablet 1 tablet  1 tablet Oral Q6H PRN Artist Beach, MD   1 tablet at 04/23/17 0825  . clopidogrel (PLAVIX) tablet 75 mg  75 mg Oral Daily Rankin, Shuvon B, NP   75 mg at 04/23/17 0819  . DULoxetine (CYMBALTA) DR capsule 120 mg  120 mg Oral Daily Izediuno, Laruth Bouchard, MD   120 mg at 04/23/17 6767  . fluticasone (FLONASE) 50 MCG/ACT nasal spray 2 spray  2 spray Each Nare Daily PRN Rankin, Shuvon B, NP      . gabapentin (NEURONTIN) capsule 100 mg  100 mg Oral TID Artist Beach, MD   100 mg at 04/23/17 0820  . glimepiride (AMARYL) tablet 4 mg  4 mg Oral Q breakfast Rankin, Shuvon B, NP   4 mg at 04/23/17 0818  . hydrOXYzine (ATARAX/VISTARIL) tablet 50 mg  50 mg Oral Q6H PRN Artist Beach, MD   50 mg at 04/22/17 2312  . isosorbide mononitrate (IMDUR) 24 hr tablet 60 mg  60 mg Oral Daily  Rankin, Shuvon B, NP   60 mg at 04/23/17 2094  . linagliptin (TRADJENTA) tablet 5 mg  5 mg Oral Daily Rankin, Shuvon B, NP   5 mg at 04/23/17 0820  . lisinopril (PRINIVIL,ZESTRIL) tablet 2.5 mg  2.5 mg Oral Daily Rankin, Shuvon B, NP   2.5 mg at 04/23/17 0818  . magnesium hydroxide (MILK OF MAGNESIA) suspension 30 mL  30 mL Oral Daily PRN Rankin, Shuvon B, NP      . metFORMIN (GLUCOPHAGE) tablet 1,000 mg  1,000 mg Oral BID WC Rankin, Shuvon B, NP   1,000 mg at 04/23/17 0821  . metoprolol succinate (TOPROL-XL) 24 hr tablet 12.5 mg  12.5 mg Oral Daily Rankin, Shuvon B, NP      . mirtazapine (REMERON) tablet 15 mg  15 mg Oral QHS Izediuno, Laruth Bouchard, MD   15 mg at 04/22/17 2258  . nicotine (NICODERM CQ - dosed in mg/24 hours) patch 21 mg  21 mg Transdermal Daily Izediuno, Laruth Bouchard, MD   21 mg at 04/23/17 0818  . nitroGLYCERIN (NITROSTAT) SL tablet 0.4 mg  0.4 mg Sublingual Q5 min PRN Rankin, Shuvon B, NP      . OLANZapine (ZYPREXA) tablet 7.5 mg  7.5 mg Oral QHS Money, Travis B, FNP   7.5 mg at 04/22/17 2258  . pantoprazole (PROTONIX) EC tablet 40 mg  40 mg Oral Daily Rankin, Shuvon B, NP   40 mg at 04/23/17 0819  . promethazine (PHENERGAN) tablet 25 mg  25 mg Oral BID PRN Rankin, Shuvon B, NP   25 mg at 04/23/17 0825  . rosuvastatin (CRESTOR) tablet 10 mg  10 mg Oral Daily Rankin, Shuvon B, NP   10 mg at 04/23/17 0819  . tamsulosin (FLOMAX) capsule 0.4 mg  0.4 mg Oral Daily Rankin, Shuvon B, NP   0.4 mg at 04/23/17 0820    Lab Results: No results found for this or any previous visit (from the past 48 hour(s)).  Blood Alcohol level:  Lab Results  Component Value Date   ETH <10 04/16/2017   ETH 76 (H) 70/96/2836    Metabolic Disorder Labs: Lab Results  Component Value Date   HGBA1C 5.8 (H) 11/17/2015   MPG 120 11/17/2015   MPG 137 09/15/2015   No  results found for: PROLACTIN Lab Results  Component Value Date   CHOL 142 09/16/2015   TRIG 112 09/16/2015   HDL 36 (L) 09/16/2015    CHOLHDL 3.9 09/16/2015   VLDL 22 09/16/2015   LDLCALC 84 09/16/2015   LDLCALC 139 (H) 11/10/2013    Physical Findings: AIMS: Facial and Oral Movements Muscles of Facial Expression: None, normal Lips and Perioral Area: None, normal Jaw: None, normal Tongue: None, normal,Extremity Movements Upper (arms, wrists, hands, fingers): None, normal Lower (legs, knees, ankles, toes): None, normal, Trunk Movements Neck, shoulders, hips: None, normal, Overall Severity Severity of abnormal movements (highest score from questions above): None, normal Incapacitation due to abnormal movements: None, normal Patient's awareness of abnormal movements (rate only patient's report): No Awareness, Dental Status Current problems with teeth and/or dentures?: No Does patient usually wear dentures?: No  CIWA:  CIWA-Ar Total: 1 COWS:  COWS Total Score: 2  Musculoskeletal: Strength & Muscle Tone: within normal limits Gait & Station: normal Patient leans: N/A  Psychiatric Specialty Exam: Physical Exam  Nursing note and vitals reviewed. Constitutional: He is oriented to person, place, and time. He appears well-developed and well-nourished.  Cardiovascular: Normal rate.  Musculoskeletal: Normal range of motion.  Neurological: He is alert and oriented to person, place, and time.  Skin: Skin is warm.    Review of Systems  Constitutional: Negative.   HENT: Negative.   Eyes: Negative.   Respiratory: Negative.   Cardiovascular: Negative.   Gastrointestinal: Negative.   Genitourinary: Negative.   Musculoskeletal: Negative.   Skin: Negative.   Neurological: Negative.   Endo/Heme/Allergies: Negative.   Psychiatric/Behavioral: Positive for depression and hallucinations. Negative for suicidal ideas. The patient is nervous/anxious.   reports frequent headaches, denies Derek pain today, denies vomiting   Blood pressure 110/69, pulse 68, temperature 97.7 F (36.5 C), temperature source Oral, resp. rate 16,  height _0  (1.803 m), weight 73 kg (161 lb).Body mass index is 22.45 kg/m.  General Appearance: Fairly Groomed  Eye Contact:  Good  Speech:  Normal Rate  Volume:  Normal  Mood:  depressed, anxious   Affect:  constricted, anxious, does smile appropriately at times   Thought Process:  Linear and Descriptions of Associations: Intact  Orientation:  Other:  fully alert and attentive.  oriented x 3   Thought Content:  reports visual hallucinations, most recently earlier this AM, not internally preoccupied, no delusions expressed   Suicidal Thoughts:  No denies suicidal ideations at this time, and contracts for safety on unit, denies homicidal or  Violent ideations  Homicidal Thoughts:  No  Memory:  recent and remote grossly intact   Judgement:  Fair  Insight:  Fair  Psychomotor Activity:  Normal- no tremors, no psychomotor agitation  Concentration:  Concentration: Good and Attention Span: Good  Recall:  Good  Fund of Knowledge:  Good  Language:  Good  Akathisia:  No  Handed:  Right  AIMS (if indicated):     Assets:  Communication Skills Desire for Improvement Financial Resources/Insurance Housing  ADL's:  Intact  Cognition:  WNL  Sleep:  Number of Hours: 5.75   Assessment - patient is a 58 year old admitted for worsening depression, anxiety and hallucinations. At this time endorses intermittent visual hallucinations. Currently not internally preoccupied, no thought disorder noted, and no evidence of delirium or confusion-0x3. Thus far tolerating medications well, denies side effects ( Cymbalta recently increased to 120 mgrs daily) . We discussed options- states Cymbalta was helpful when he started taking  it, well tolerated, so prefers to maintain Cymbalta trial at this time.  Of note, complains of increased abnormal,vivid dreams, which may be temporally related to Remeron trial.  I clarified allergy reports that indicate  Neurontin has been associated with anxiety - patient states he  feels Neurontin is well tolerated and not causing side effects at this time.  Treatment Plan Summary: Treatment Plan reviewed as below today 1/23 Daily contact with patient to assess and evaluate symptoms and progress in treatment, Medication management and Plan is to:  -Increase Zyprexa to 10 mg PO QHS for psychotic symptoms and for antidepressant  augmentation -Continue Cymbalta  at 60  mg PO BID  for depression, anxiety  -Increase  Gabapentin to 200 mg PO TID for anxiety, pain  -Continue Vistaril 50 mg PO Q6H PRN for anxiety -D/C Remeron to minimize sedation and to minimize potential side effects- see above  -Monitor BMP to monitor Na+ - Cymbalta /other medications may be associated with hyponatremia as possible side effect -Check EKG to monitor QTc  -Encourage group therapy participation  Jenne Campus, MD 04/23/2017, 11:53 AM   Patient ID: Derek Chen, male   DOB: 06-05-1960, 57 y.o.   MRN: [id number]

## 2017-04-23 NOTE — Progress Notes (Signed)
Nursing Progress Note: 7p-7a D: Pt currently presents with a anxious/depression affect and behavior. Pt states "My day was good. I am not hearing the demons at this time." Interacting appropriately with the milieu. Pt reports fair sleep during the previous night with current medication regimen. Pt did attend wrap-up group.  A: Pt provided with medications per providers orders. Pt's labs and vitals were monitored throughout the night. Pt supported emotionally and encouraged to express concerns and questions. Pt educated on medications.  R: Pt's safety ensured with 15 minute and environmental checks. Pt currently denies SI, HI, and AVH. Pt verbally contracts to seek staff if SI,HI, or AVH occurs and to consult with staff before acting on any harmful thoughts. Will continue to monitor.

## 2017-04-23 NOTE — Progress Notes (Signed)
Data. Patient denies SI/HI. Does report, "The little demons I see and hear around me telling me that I'm worthless." Verbally contracts for safety on the unit and to come to staff before acting of any self harm thoughts/feelings/voices. Patient also reports he feels, "Really antsy and agitated today." Patient interacting well with staff and other patients. On his self assessment patient reports  9/10 for depression and hopelessness and 10/10 for anxiety. His goal for today is, "Anxiety." Action. Emotional support and encouragement offered. Education provided on medication, indications and side effect. Q 15 minute checks done for safety. Response. Safety on the unit maintained through 15 minute checks.  Medications taken as prescribed. Attended groups. Remained calm and appropriate through out shift.
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
## 2017-04-23 NOTE — [facility] Group Notes (Signed)
[facility] Group Therapy 04/23/2017 1:15pm  Type of Therapy: Mental Health Association Presentation  Participation Level: Active  Participation Quality: Attentive  Affect: Appropriate  Cognitive: Oriented  Insight: Developing/Improving  Engagement in Therapy: Engaged  Modes of Intervention: Discussion, Education and Socialization  Summary of Progress/Problems: [place] ([place]) Speaker came to talk about his personal journey with mental health. The pt processed ways by which to relate to the speaker. [place] speaker provided handouts and educational information pertaining to groups and services offered by the [facility]. Pt was engaged in speaker's presentation and was receptive to resources provided.    Joanne Chars, LCSW 04/23/2017 3:31 PM

## 2017-04-23 NOTE — Plan of Care (Signed)
Patient is progressing in his treatment goals. He still is vague in his ability to verbalize his felings, but progressing. Sleep is getting better.

## 2017-04-24 LAB — BASIC METABOLIC PANEL
Anion gap: 8 (ref 5–15)
BUN: 22 mg/dL — ABNORMAL HIGH (ref 6–20)
CALCIUM: 9.1 mg/dL (ref 8.9–10.3)
CO2: 22 mmol/L (ref 22–32)
CREATININE: 0.86 mg/dL (ref 0.61–1.24)
Chloride: 108 mmol/L (ref 101–111)
GFR calc Af Amer: >60 mL/min (ref >60)
GFR calc non Af Amer: >60 mL/min (ref >60)
GLUCOSE: 175 mg/dL — AB (ref 65–99)
Potassium: 4.2 mmol/L (ref 3.5–5.1)
Sodium: 138 mmol/L (ref 135–145)

## 2017-04-24 MED ORDER — TAMSULOSIN HCL 0.4 MG PO CAPS: 0.4000 mg | ORAL_CAPSULE | Freq: Every day | ORAL | 0 refills | Status: AC

## 2017-04-24 MED ORDER — METFORMIN HCL 1000 MG PO TABS: 1000.0000 mg | ORAL_TABLET | Freq: Two times a day (BID) | ORAL | 0 refills | Status: DC

## 2017-04-24 MED ORDER — HYDROXYZINE HCL 50 MG PO TABS: 50.0000 mg | ORAL_TABLET | Freq: Four times a day (QID) | ORAL | 0 refills | Status: DC | PRN

## 2017-04-24 MED ORDER — PANTOPRAZOLE SODIUM 40 MG PO TBEC: 40.0000 mg | DELAYED_RELEASE_TABLET | Freq: Every day | ORAL | 0 refills | Status: DC

## 2017-04-24 MED ORDER — GLIMEPIRIDE 4 MG PO TABS: 4.0000 mg | ORAL_TABLET | Freq: Every day | ORAL | 0 refills | Status: DC

## 2017-04-24 MED ORDER — ALBUTEROL SULFATE HFA 108 (90 BASE) MCG/ACT IN AERS: 1.0000 | INHALATION_SPRAY | Freq: Every day | RESPIRATORY_TRACT | 0 refills | Status: AC | PRN

## 2017-04-24 MED ORDER — OLANZAPINE 10 MG PO TABS: 10.0000 mg | ORAL_TABLET | Freq: Every day | ORAL | 0 refills | Status: DC

## 2017-04-24 MED ORDER — CLOPIDOGREL BISULFATE 75 MG PO TABS: 75.0000 mg | ORAL_TABLET | Freq: Every day | ORAL | 0 refills | Status: DC

## 2017-04-24 MED ORDER — NITROGLYCERIN 0.4 MG SL SUBL: 0.4000 mg | SUBLINGUAL_TABLET | SUBLINGUAL | 1 refills | Status: AC | PRN

## 2017-04-24 MED ORDER — ISOSORBIDE MONONITRATE ER 60 MG PO TB24: 60.0000 mg | ORAL_TABLET | Freq: Every day | ORAL | 0 refills | Status: DC

## 2017-04-24 MED ORDER — LINAGLIPTIN 5 MG PO TABS: 5.0000 mg | ORAL_TABLET | Freq: Every day | ORAL | 0 refills | Status: DC

## 2017-04-24 MED ORDER — ROSUVASTATIN CALCIUM 10 MG PO TABS: 10.0000 mg | ORAL_TABLET | Freq: Every day | ORAL | 0 refills | Status: DC

## 2017-04-24 MED ORDER — METOPROLOL SUCCINATE ER 25 MG PO TB24: 12.5000 mg | ORAL_TABLET | Freq: Every day | ORAL | 0 refills | Status: DC

## 2017-04-24 MED ORDER — LISINOPRIL 2.5 MG PO TABS: 2.5000 mg | ORAL_TABLET | Freq: Every day | ORAL | 0 refills | Status: DC

## 2017-04-24 MED ORDER — DULOXETINE HCL 60 MG PO CPEP: 60.0000 mg | ORAL_CAPSULE | Freq: Two times a day (BID) | ORAL | 0 refills | Status: AC

## 2017-04-24 MED ORDER — GABAPENTIN 100 MG PO CAPS: 200.0000 mg | ORAL_CAPSULE | Freq: Three times a day (TID) | ORAL | 0 refills | Status: DC

## 2017-04-24 NOTE — Progress Notes (Signed)
CSW spoke with pt regarding projected discharge for today or tomorrow.  Pt reports he has worked out a deal with his mother where he will pay half the bills and he can return.  CSW then spoke to pt mother who said she did talk about this with pt.  She is still unsure if "this will work" but is agreeable to him coming back under that arrangement for now.  CSW encouraged her to be firm moving forward. Winferd Humphrey, MSW, LCSW Clinical Social Worker 04/24/2017 9:26 AM

## 2017-04-24 NOTE — BHH Suicide Risk Assessment (Addendum)
[facility] Discharge Suicide Risk Assessment   Principal Problem: MDD (major depressive disorder), recurrent, severe, with psychosis ([place]) Discharge Diagnoses:  Patient Active Problem List   Diagnosis Date Noted  . MDD (major depressive disorder), recurrent, severe, with psychosis ([place]) [F33.3] 04/18/2017  . Suicidal ideations [R45.851] 04/16/2017  . Atypical chest pain [R07.89] 11/15/2015  . Hypokalemia [E87.6] 09/15/2015  . Chest pain [R07.9] 09/15/2015  . Insomnia [G47.00] 09/15/2015  . Depression [F32.9] 09/15/2015  . 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 Non-insulin dependent type 2 diabetes mellitus ([place]) [E11.9]   . Erectile dysfunction associated with type 2 diabetes mellitus ([place]) [E11.69, N52.1] 01/21/2014  . COPD, moderate ([place]) [J44.9] 07/15/2013  . Diabetes ([place]) [E11.9] 11/06/2012  . BPH (benign prostatic hyperplasia) [N40.0] 11/06/2012  . 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 Palpitations [R00.2] 03/29/2012  . Dyspnea and respiratory abnormality [R06.00, R06.89] 03/29/2012  . Breath shortness [R06.02] 03/29/2012  . Acute myocardial infarction, subendocardial infarction ([place]) [I21.4] 03/27/2012  . Heart disease [I51.9] 03/27/2012  . Hepatitis B antibody positive [R76.8] 03/27/2012  . Coronary atherosclerosis [I25.10] 03/27/2012  . Acute non-ST segment elevation myocardial infarction ([place]) [I21.4] 03/27/2012  . Left ventricular dysfunction [I51.9] 03/27/2012  . Anxiety [F41.9] 03/27/2012  . Essential hypertension, benign [I10]   . Basal cell carcinoma [C44.91]   . Coronary atherosclerosis of native coronary artery [I25.10]   . Hyperlipidemia with target LDL less than 100 [E78.5] 04/19/2010    Total Time spent with patient: 30 minutes  Musculoskeletal: Strength & Muscle Tone: within normal limits Gait & Station: normal Patient leans: N/A  Psychiatric Specialty Exam: ROS  Reports history of  headaches, no chest pain, no dyspnea, no vomiting , no rash   Blood pressure 118/74, pulse 85, temperature 97.9 F (36.6 C), temperature source Oral, resp. rate 16, height 5\' 11"  (1.803 m), weight 73 kg (161 lb).Body mass index is 22.45 kg/m.  General Appearance: Well Groomed  Eye Contact::  Good  Speech:  Normal Rate409  Volume:  Normal  Mood:  reports he is feeling better than on admission  Affect:  more reactive, smiles and even laughs appropriately during session  Thought Process:  Linear and Descriptions of Associations: Intact  Orientation:  Other:  fully alert and attentive   Thought Content:  reports residual brief visual hallucination, human figures, which are fleeting. At this time not internally preoccupied, no bizarre or paranoid behavior, no delusions expressed   Suicidal Thoughts:  No denies any suicidal or self injurious ideations, denies any homicidal or violent ideations   Homicidal Thoughts:  No  Memory:  recent and remote grossly intact   Judgement:  Fair- improving   Insight:  Fair  Psychomotor Activity:  Normal  Concentration:  Good  Recall:  Good  Fund of Knowledge:Good  Language: Good  Akathisia:  Negative  Handed:  Right  AIMS (if indicated):   no abnormal or involuntary movements noted or reported   Assets:  Communication Skills Desire for Improvement Resilience  Sleep:  Number of Hours: 6.75  Cognition: WNL  ADL's:  Intact   Mental Status Per Nursing Assessment::   On Admission:     Demographic Factors:  57 year old separated male, currently living with mother, on disability   Loss Factors: Recent separation  Historical Factors: Reports history of depression, reports history of  psychotic symptoms. No suicide attempts, denies history of violence .   Risk Reduction Factors:   Sense of responsibility to family,  Living with another person, especially a relative and Positive coping skills or problem solving skills  Continued Clinical Symptoms:   At this time patient is alert, attentive , better related, calm, mood is described as partially improved, affect is more reactive, fuller in range, no thought disorder , no suicidal ideations, no homicidal ideations, reports some improvement of psychotic symptoms, which he describes as intermittent visual hallucinations and at times auditory hallucinations. No delusions are expressed, does not appear internally preoccupied, thought process is linear and well organized . Future oriented, States he plans to establish care at [facility] in [place] and that he plans to follow up with his PCP . Of note, report is that mother agreed for patient to return to live with her after discharge, which she had initially expressed reluctance about . Patient states " it helped me feel better to know I can go back". Behavior on unit in good control. Pleasant on approach. Denies medication side effects.   Cognitive Features That Contribute To Risk:  No gross cognitive deficits noted upon discharge. Is alert , attentive, and oriented x 3    Suicide Risk:  Mild:  Suicidal ideation of limited frequency, intensity, duration, and specificity.  There are no identifiable plans, no associated intent, mild dysphoria and related symptoms, good self-control (both objective and subjective assessment), few other risk factors, and identifiable protective factors, including available and accessible social support.  Follow-up Information    Services, [facility]. Go on 04/25/2017.   Why:  Please attend your discharge appt on Friday, 04/25/17, at 9:00am.  Please bring your medicare card and proof of your disability income. Contact information: [place] [address] [phone]        Medicine, [facility] Internal Follow up.   Specialty:  Internal Medicine Why:  Please contact Tina at this office to complete registration and schedule your first appointment. Contact information: [address] [place]  [address] [phone]           Plan Of Care/Follow-up recommendations:  Activity:  as tolerated  Diet:  heart healthy, diabetic diet  Tests:  NA Other:  See below  Patient expresses readiness for discharge today- no current grounds for involuntary commitment  States his sister will be picking him up later today. Plans to return to live with mother . Plans to follow up at [facility], and with PCP in [place].   Jenne Campus, MD 04/24/2017, 2:41 PM

## 2017-04-24 NOTE — BHH Group Notes (Signed)
Orientation group was facilitated this morning. Writer discussed rules and expectations of the unit. Writer discussed and passed out self inventory sheets. Writer asked if any patients needed bus passes and/or notes for work or school. Writer discussed the schedule for the day and explained the process for laundry, phone times, and meal times. Writer ask if patients had any questions or concerns about medications, discharge, etc. Writer ask for each patient to give one goal they wanted to work on today and pt stated to work on his self confidence . Group was closed by writer collecting self inventory sheets and explaining what was next on the schedule. Group adjourned.

## 2017-04-24 NOTE — BHH Group Notes (Signed)
[place] LCSW Group Therapy Note  Date/Time: 04/24/17, 1315  Type of Therapy/Topic:  Group Therapy:  Balance in Life  Participation Level:  moderate  Description of Group:    This group will address the concept of balance and how it feels and looks when one is unbalanced. Patients will be encouraged to process areas in their lives that are out of balance, and identify reasons for remaining unbalanced. Facilitators will guide patients utilizing problem- solving interventions to address and correct the stressor making their life unbalanced. Understanding and applying boundaries will be explored and addressed for obtaining  and maintaining a balanced life. Patients will be encouraged to explore ways to assertively make their unbalanced needs known to significant others in their lives, using other group members and facilitator for support and feedback.  Therapeutic Goals: 1. Patient will identify two or more emotions or situations they have that consume much of in their lives. 2. Patient will identify signs/triggers that life has become out of balance:  3. Patient will identify two ways to set boundaries in order to achieve balance in their lives:  4. Patient will demonstrate ability to communicate their needs through discussion and/or role plays  Summary of Patient Progress: Pt identified mental/ emotional, family, and physical as areas of life that are out of balance.  Pt participated in group discussion regarding how to identify and take steps in the right direction when life gets out of balance.           Therapeutic Modalities:   Cognitive Behavioral Therapy Solution-Focused Therapy Assertiveness Training  Lurline Idol, [place]

## 2017-04-24 NOTE — Progress Notes (Signed)
  [facility] Adult Case Management Discharge Plan :  Will you be returning to the same living situation after discharge:  No. Pt will be staying with his mother. At discharge, do you have transportation home?: Yes,  sister Do you have the ability to pay for your medications: Yes,  [employer]  Release of information consent forms completed and in the chart;  Patient's signature needed at discharge.  Patient to Follow up at: Follow-up Information    Services, [facility]. Go on 04/25/2017.   Why:  Please attend your discharge appt on Friday, 04/25/17, at 9:00am.  Please bring your medicare card and proof of your disability income. Contact information: [place] [address] [phone]        Medicine, [facility] Internal Follow up.   Specialty:  Internal Medicine Why:  Please contact Tina at this office to complete registration and schedule your first appointment. Contact information: [place] [address] [phone]           Next level of care provider has access to [place] and Suicide Prevention discussed: No. Identified individual declined to provide support.  Have you used any form of tobacco in the last 30 days? (Cigarettes, Smokeless Tobacco, Cigars, and/or Pipes): Yes  Has patient been referred to the Quitline?: Yes, faxed on 04/24/17  Patient has been referred for addiction treatment: Yes  Joanne Chars, LCSW 04/24/2017, 10:46 AM

## 2017-04-24 NOTE — Progress Notes (Signed)
Patient discharged to lobby. Patient was stable and appreciative at that time. All papers, samples and prescriptions were given and valuables returned. Verbal understanding expressed. Denies SI/HI and A/VH. Patient given opportunity to express concerns and ask questions.  

## 2017-04-24 NOTE — Discharge Summary (Signed)
Physician Discharge Summary Note  Patient:  Derek Chen is an 57 y.o., male MRN:  [id number] DOB:  Dec 08, 1960 Patient phone:  [phone] (home)  Patient address:   [address] [place] [address],  Total Time spent with patient: 20 minutes  Date of Admission:  04/18/2017 Date of Discharge: 04/24/17  Reason for Admission:  Worsening depression with SI  Principal Problem: MDD (major depressive disorder), recurrent, severe, with psychosis [facility]) Discharge Diagnoses: Patient Active Problem List   Diagnosis Date Noted  . MDD (major depressive disorder), recurrent, severe, with psychosis ([place]) [F33.3] 04/18/2017  . Suicidal ideations [R45.851] 04/16/2017  . Atypical chest pain [R07.89] 11/15/2015  . Hypokalemia [E87.6] 09/15/2015  . Chest pain [R07.9] 09/15/2015  . Insomnia [G47.00] 09/15/2015  . Depression [F32.9] 09/15/2015  . 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 Non-insulin dependent type 2 diabetes mellitus ([place]) [E11.9]   . Erectile dysfunction associated with type 2 diabetes mellitus ([place]) [E11.69, N52.1] 01/21/2014  . COPD, moderate ([place]) [J44.9] 07/15/2013  . Diabetes ([place]) [E11.9] 11/06/2012  . BPH (benign prostatic hyperplasia) [N40.0] 11/06/2012  . 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 Palpitations [R00.2] 03/29/2012  . Dyspnea and respiratory abnormality [R06.00, R06.89] 03/29/2012  . Breath shortness [R06.02] 03/29/2012  . Acute myocardial infarction, subendocardial infarction ([place]) [I21.4] 03/27/2012  . Heart disease [I51.9] 03/27/2012  . Hepatitis B antibody positive [R76.8] 03/27/2012  . Coronary atherosclerosis [I25.10] 03/27/2012  . Acute non-ST segment elevation myocardial infarction ([place]) [I21.4] 03/27/2012  . Left ventricular dysfunction [I51.9] 03/27/2012  . Anxiety [F41.9] 03/27/2012  . Essential hypertension, benign [I10]   . Basal cell carcinoma [C44.91]   . Coronary  atherosclerosis of native coronary artery [I25.10]   . Hyperlipidemia with target LDL less than 100 [E78.5] 04/19/2010    Past Psychiatric History: Depression for over six years. He is treated in out patient with Duloxetine 60 mg daily. He used to be on Benzodiazepines and opiates. No past inpatient care.  No past history of mania. No past history of psychosis. No past history of suicidal behavior. No past history of violent behavior  Past Medical History:  Past Medical History:  Diagnosis Date  . Anxiety   . Arthritis   . Basal cell cancer    Chest and back - status post skin grafting  . BPH (benign prostatic hyperplasia)   . Cardiomyopathy (HCC)    LVEF 45-50%  . COPD (chronic obstructive pulmonary disease) ([place])   . Coronary artery disease    NSTEMI - DES mid LAD December 2011 (patent December 2013)  . Dyslipidemia   . Essential hypertension, benign   . GERD (gastroesophageal reflux disease)   . Hepatitis B antibody positive 2006  . History of kidney stones   . MI (myocardial infarction) ([place])    3 MI's last in 2016.  Marland Kitchen PONV (postoperative nausea and vomiting)    pt states that zofran and scope patch did not help PONV after last surgery.  . Sleep apnea   . Type 2 diabetes mellitus ([place]) 2012  . Viral pneumonia, unspecified 2004    Past Surgical History:  Procedure Laterality Date  . APPENDECTOMY    . COLONOSCOPY N/A 08/10/2015   Procedure: COLONOSCOPY;  Surgeon: Rogene Houston, MD;  Location: [facility] ENDO SUITE;  Service: Endoscopy;  Laterality: N/A;  730  . CORONARY STENT PLACEMENT    . FINGER SURGERY Right    index, gnagrene in finger.  Marland Kitchen KNEE  ARTHROSCOPY     Left  . LEFT HEART CATHETERIZATION WITH CORONARY ANGIOGRAM N/A 03/20/2012   Procedure: LEFT HEART CATHETERIZATION WITH CORONARY ANGIOGRAM;  Surgeon: Thayer Headings, MD;  Location: [facility] CATH LAB;  Service: Cardiovascular;  Laterality: N/A;  . MASS EXCISION N/A 08/23/2016   Procedure: EXCISION BASAL CELL CARCINOMA ON  BACK OVER 4 CM;  Surgeon: Aviva Signs, MD;  Location: [facility] ORS;  Service: General;  Laterality: N/A;  . PORT-A-CATH REMOVAL    . PORTACATH PLACEMENT    . SKIN GRAFT SPLIT THICKNESS TRUNK    01/23/2010    Right chest;  Felicie Morn, M.D.  . VASECTOMY     Family History:  Family History  Problem Relation Age of Onset  . Diabetes Mother   . Deep vein thrombosis Mother   . Pulmonary embolism Mother   . Cancer Father        hodgins lymphoma  . Diabetes Sister    Family Psychiatric  History: strong family history of addiction. No family history of any other psychiatric illness or suicide  Social History:  Social History   Substance and Sexual Activity  Alcohol Use Yes  . Alcohol/week: 1.0 - 1.5 oz  . Types: 2 - 3 Standard drinks or equivalent per week   Comment: Drinks occasional beer and wine     Social History   Substance and Sexual Activity  Drug Use No    Social History   Socioeconomic History  . Marital status: Married    Spouse name: None  . Number of children: 2  . Years of education: None  . Highest education level: None  Social Needs  . Financial resource strain: None  . Food insecurity - worry: None  . Food insecurity - inability: None  . Transportation needs - medical: None  . Transportation needs - non-medical: None  Occupational History  . Occupation: IT trainer    Comment: [employer]  Tobacco Use  . Smoking status: Current Every Day Smoker    Packs/day: 0.25    Years: 35.00    Pack years: 8.75    Types: Cigarettes  . Smokeless tobacco: Former Systems developer    Types: [place] date: 08/22/2006  Substance and Sexual Activity  . Alcohol use: Yes    Alcohol/week: 1.0 - 1.5 oz    Types: 2 - 3 Standard drinks or equivalent per week    Comment: Drinks occasional beer and wine  . Drug use: No  . Sexual activity: Yes    Birth control/protection: None  Other Topics Concern  . None  Social History Narrative  . None    Hospital  Course:   04/19/17 [facility] MD Assessment: 57 y.o Caucasian male, recently separated, moved in with his mother, on disability. Background history of MDD recurrent and multiple medical issues. Presented to the ER via the EMS. Called for help because he was having overwhelming suicidal thoughts. While in the ambulance developed chest pain. Main stressor is separation from his wife three weeks ago. He has been hearing a very critical voice since then. EKG was significant for RBBB. He evaluated by cardiology and managed at the ER prior to transfer. No substances on board. At interview, patient reports a long history of depression. Says he has been taking his medications as prescribed. He is on probation for identity theft. Says he lies a lot to cover his self esteem. Patient says his actions led to their separation three weeks ago. He moved in with  his mother. Says his mother has dementia. It has been overwhelming for him looking after his mother. Says he has gotten more depressed. He barely gets two to four hours of sleep at night. Says he " feels as if the walls are closing in ,,,, I hear the voice of the demon telling me I am worthless ,,,, kill my self ,,,,, I have severe anxiety and headache most of the time ,,,, I have vivid dreams at night". Patient reports deterioration in his appetite. Says he feels drained most of the time. He has been having strong thoughts of ending it all. Says he has no specific plans. He decided to come seek help. Says he would everything possible to gain back the trust of his wife. Patient wants [name] to speak to [facility] and let her know he is serious on getting help. Patient says this is his third marriage. Says he used to drink a bit in the past. Says he is too old for that now.  No evidence of mania. No evidence of PTSD. No substance use. No thoughts of harming others. No thoughts of violence. No access to weapons. Denies any other stressors at this time.  Patient remained o the [facility] unit  for 5 days and stabilized with medications and therapy. Patient was started on Cymbalta and titrated to 60 mg BID, Neurontin 200 mg BID, Vistaril 50 mg Q6H PRN, and Zyprexa 10 mg QHS. Patient shown improvement with improved mood, affect, sleep, appetite, and interaction. Patient is seen in the day room interacting with peers and staff appropriately. Patient has been attending groups. Reports by staff indicate that the patient has been "splitting staff" and has been seeking medications. Patient denies any SI/HI/AVH and contracts for safety. Patient will be returning home with his mom and will be transported by his sister. He agrees to follow up at [facility]. Patient is provided with prescriptions and samples of his medications upon discharge.    Physical Findings: AIMS: Facial and Oral Movements Muscles of Facial Expression: None, normal Lips and Perioral Area: None, normal Jaw: None, normal Tongue: None, normal,Extremity Movements Upper (arms, wrists, hands, fingers): None, normal Lower (legs, knees, ankles, toes): None, normal, Trunk Movements Neck, shoulders, hips: None, normal, Overall Severity Severity of abnormal movements (highest score from questions above): None, normal Incapacitation due to abnormal movements: None, normal Patient's awareness of abnormal movements (rate only patient's report): No Awareness, Dental Status Current problems with teeth and/or dentures?: No Does patient usually wear dentures?: No  CIWA:  CIWA-Ar Total: 1 COWS:  COWS Total Score: 2  Musculoskeletal: Strength & Muscle Tone: within normal limits Gait & Station: normal Patient leans: N/A  Psychiatric Specialty Exam: Physical Exam  Nursing note and vitals reviewed. Constitutional: He is oriented to person, place, and time. He appears well-developed and well-nourished.  Cardiovascular: Normal rate.  Respiratory: Effort normal.  Musculoskeletal: Normal range of motion.  Neurological: He is alert  and oriented to person, place, and time.    Review of Systems  Constitutional: Negative.   HENT: Negative.   Eyes: Negative.   Respiratory: Negative.   Cardiovascular: Negative.   Gastrointestinal: Negative.   Genitourinary: Negative.   Musculoskeletal: Negative.   Skin: Negative.   Neurological: Negative.   Endo/Heme/Allergies: Negative.   Psychiatric/Behavioral: Negative.     Blood pressure 118/74, pulse 85, temperature 97.9 F (36.6 C), temperature source Oral, resp. rate 16, height 5\' 11"  (1.803 m), weight 73 kg (161 lb).Body mass index is 22.45 kg/m.  General  Appearance: Casual  Eye Contact:  Good  Speech:  Clear and Coherent and Normal Rate  Volume:  Normal  Mood:  Euthymic  Affect:  Appropriate  Thought Process:  Goal Directed and Descriptions of Associations: Intact  Orientation:  Full (Time, Place, and Person)  Thought Content:  WDL  Suicidal Thoughts:  No  Homicidal Thoughts:  No  Memory:  Immediate;   Good Recent;   Good Remote;   Good  Judgement:  Good  Insight:  Good  Psychomotor Activity:  Normal  Concentration:  Concentration: Good and Attention Span: Good  Recall:  Good  Fund of Knowledge:  Good  Language:  Good  Akathisia:  No  Handed:  Right  AIMS (if indicated):     Assets:  Communication Skills Desire for Improvement Financial Resources/Insurance Housing Physical Health Social Support Transportation  ADL's:  Intact  Cognition:  WNL  Sleep:  Number of Hours: 6.75     Have you used any form of tobacco in the last 30 days? (Cigarettes, Smokeless Tobacco, Cigars, and/or Pipes): Yes  Has this patient used any form of tobacco in the last 30 days? (Cigarettes, Smokeless Tobacco, Cigars, and/or Pipes) Yes, Yes, A prescription for an FDA-approved tobacco cessation medication was offered at discharge and the patient refused  Blood Alcohol level:  Lab Results  Component Value Date   ETH <10 04/16/2017   ETH 76 (H) 09/38/1829    Metabolic  Disorder Labs:  Lab Results  Component Value Date   HGBA1C 5.8 (H) 11/17/2015   MPG 120 11/17/2015   MPG 137 09/15/2015   No results found for: PROLACTIN Lab Results  Component Value Date   CHOL 142 09/16/2015   TRIG 112 09/16/2015   HDL 36 (L) 09/16/2015   CHOLHDL 3.9 09/16/2015   VLDL 22 09/16/2015   LDLCALC 84 09/16/2015   LDLCALC 139 (H) 11/10/2013    See Psychiatric Specialty Exam and Suicide Risk Assessment completed by Attending Physician prior to discharge.  Discharge destination:  Home  Is patient on multiple antipsychotic therapies at discharge:  No   Has Patient had three or more failed trials of antipsychotic monotherapy by history:  No  Recommended Plan for Multiple Antipsychotic Therapies: NA   Allergies as of 04/24/2017      Reactions   Ketorolac Diarrhea   Advil [ibuprofen] Itching   Dilaudid [hydromorphone] Nausea And Vomiting   Tolerates IV formulation--unable to tolerate pill form   Mobic [meloxicam] Other (See Comments)   Severe headaches.   Naproxen Nausea Only   Tramadol Hives, Itching   Vicodin [hydrocodone-acetaminophen] Hives   Cimetidine Other (See Comments), Rash   headaches Severe headache   Gabapentin Anxiety   "Neck twitches"   Tape Rash   Paper tape      Medication List    STOP taking these medications   albuterol (2.5 MG/3ML) 0.083% nebulizer solution Commonly known as:  PROVENTIL   Albuterol Sulfate 108 (90 Base) MCG/ACT Aepb Replaced by:  albuterol 108 (90 Base) MCG/ACT inhaler   budesonide 0.5 MG/2ML nebulizer solution Commonly known as:  PULMICORT   Cinnamon 500 MG capsule   clonazePAM 1 MG tablet Commonly known as:  KLONOPIN   sitaGLIPtin 100 MG tablet Commonly known as:  JANUVIA Replaced by:  linagliptin 5 MG Tabs tablet     TAKE these medications     Indication  albuterol 108 (90 Base) MCG/ACT inhaler Commonly known as:  PROVENTIL HFA;VENTOLIN HFA Inhale 1-2 puffs into the lungs daily as needed (  for  shortness of breath/wheezing). Replaces:  Albuterol Sulfate 108 (90 Base) MCG/ACT Aepb  Indication:  Asthma   aspirin 81 MG EC tablet Take 1 tablet (81 mg total) by mouth daily.  Indication:  Per PCP   clopidogrel 75 MG tablet Commonly known as:  PLAVIX Take 1 tablet (75 mg total) by mouth daily. Start taking on:  04/25/2017  Indication:  Per PCP   DULoxetine 60 MG capsule Commonly known as:  CYMBALTA Take 1 capsule (60 mg total) by mouth 2 (two) times daily. What changed:  when to take this  Indication:  Involuntary Urination due to Coughing or Straining, mood stability   fluticasone 50 MCG/ACT nasal spray Commonly known as:  FLONASE Place 2 sprays into both nostrils daily as needed for allergies or rhinitis.  Indication:  Signs and Symptoms of Nose Diseases   gabapentin 100 MG capsule Commonly known as:  NEURONTIN Take 2 capsules (200 mg total) by mouth 3 (three) times daily. For agitation  Indication:  Agitation   glimepiride 4 MG tablet Commonly known as:  AMARYL Take 1 tablet (4 mg total) by mouth daily with breakfast. For diabetes Start taking on:  04/25/2017 What changed:  additional instructions  Indication:  Type 2 Diabetes   hydrOXYzine 50 MG tablet Commonly known as:  ATARAX/VISTARIL Take 1 tablet (50 mg total) by mouth every 6 (six) hours as needed for anxiety.  Indication:  Feeling Anxious   isosorbide mononitrate 60 MG 24 hr tablet Commonly known as:  IMDUR Take 1 tablet (60 mg total) by mouth daily. Start taking on:  04/25/2017  Indication:  Per PCP   linagliptin 5 MG Tabs tablet Commonly known as:  TRADJENTA Take 1 tablet (5 mg total) by mouth daily. Start taking on:  04/25/2017 Replaces:  sitaGLIPtin 100 MG tablet  Indication:  Type 2 Diabetes   lisinopril 2.5 MG tablet Commonly known as:  PRINIVIL,ZESTRIL Take 1 tablet (2.5 mg total) by mouth daily. Start taking on:  04/25/2017  Indication:  High Blood Pressure Disorder   metFORMIN 1000 MG  tablet Commonly known as:  GLUCOPHAGE Take 1 tablet (1,000 mg total) by mouth 2 (two) times daily with a meal. What changed:  when to take this  Indication:  Type 2 Diabetes   metoprolol succinate 25 MG 24 hr tablet Commonly known as:  TOPROL-XL Take 0.5 tablets (12.5 mg total) by mouth daily. Start taking on:  04/25/2017  Indication:  Per PCP   nitroGLYCERIN 0.4 MG SL tablet Commonly known as:  NITROSTAT Place 1 tablet (0.4 mg total) under the tongue every 5 (five) minutes as needed for chest pain.  Indication:  Per PCP   OLANZapine 10 MG tablet Commonly known as:  ZYPREXA Take 1 tablet (10 mg total) by mouth at bedtime. For mood control  Indication:  Stuttering, mood stability   pantoprazole 40 MG tablet Commonly known as:  PROTONIX Take 1 tablet (40 mg total) by mouth daily. Start taking on:  04/25/2017  Indication:  Gastroesophageal Reflux Disease   promethazine 25 MG tablet Commonly known as:  PHENERGAN Take 1 tablet (25 mg total) by mouth 2 (two) times daily as needed for nausea or vomiting.  Indication:  Nausea and Vomiting   rosuvastatin 10 MG tablet Commonly known as:  CRESTOR Take 1 tablet (10 mg total) by mouth daily. Start taking on:  04/25/2017  Indication:  High Amount of Fats in the Blood   tamsulosin 0.4 MG Caps capsule Commonly known as:  FLOMAX  Take 1 capsule (0.4 mg total) by mouth daily. Start taking on:  04/25/2017  Indication:  Benign Enlargement of Prostate      Follow-up Information    Services, [facility]. Go on 04/25/2017.   Why:  Please attend your discharge appt on Friday, 04/25/17, at 9:00am.  Please bring your medicare card and proof of your disability income. Contact information: [place] [address] [phone]        Medicine, [facility] Internal Follow up.   Specialty:  Internal Medicine Why:  Social worker will confirm your first appointment date/time. Contact information: [place]  [address] [phone]           Follow-up recommendations:  Continue activity as tolerated. Continue diet as recommended by your PCP. Ensure to keep all appointments with outpatient providers.  Comments:  Patient is instructed prior to discharge to: Take all medications as prescribed by his/her mental healthcare provider. Report any adverse effects and or reactions from the medicines to his/her outpatient provider promptly. Patient has been instructed & cautioned: To not engage in alcohol and or illegal drug use while on prescription medicines. In the event of worsening symptoms, patient is instructed to call the crisis hotline, 911 and or go to the nearest ED for appropriate evaluation and treatment of symptoms. To follow-up with his/her primary care provider for your other medical issues, concerns and or health care needs.    Signed: [place], FNP 04/24/2017, 10:43 AM   Patient seen, Suicide Assessment Completed.  Disposition Plan Reviewed

## 2017-04-29 ENCOUNTER — Ambulatory Visit: Payer: Medicare Other | Admitting: Cardiovascular Disease

## 2017-04-30 ENCOUNTER — Encounter: Payer: Self-pay | Admitting: Cardiovascular Disease

## 2017-05-02 ENCOUNTER — Ambulatory Visit (INDEPENDENT_AMBULATORY_CARE_PROVIDER_SITE_OTHER): Payer: Medicare Other | Admitting: Cardiovascular Disease

## 2017-05-02 ENCOUNTER — Telehealth: Payer: Self-pay | Admitting: Cardiovascular Disease

## 2017-05-02 ENCOUNTER — Encounter: Payer: Self-pay | Admitting: *Deleted

## 2017-05-02 ENCOUNTER — Encounter: Payer: Self-pay | Admitting: Cardiovascular Disease

## 2017-05-02 VITALS — BP 130/82 | HR 70 | Ht 71.0 in | Wt 172.0 lb

## 2017-05-02 DIAGNOSIS — I10 Essential (primary) hypertension: Secondary | ICD-10-CM | POA: Diagnosis not present

## 2017-05-02 DIAGNOSIS — Z955 Presence of coronary angioplasty implant and graft: Secondary | ICD-10-CM | POA: Diagnosis not present

## 2017-05-02 DIAGNOSIS — I519 Heart disease, unspecified: Secondary | ICD-10-CM | POA: Diagnosis not present

## 2017-05-02 DIAGNOSIS — F329 Major depressive disorder, single episode, unspecified: Secondary | ICD-10-CM

## 2017-05-02 DIAGNOSIS — Z9289 Personal history of other medical treatment: Secondary | ICD-10-CM

## 2017-05-02 DIAGNOSIS — E785 Hyperlipidemia, unspecified: Secondary | ICD-10-CM | POA: Diagnosis not present

## 2017-05-02 DIAGNOSIS — I25118 Atherosclerotic heart disease of native coronary artery with other forms of angina pectoris: Secondary | ICD-10-CM

## 2017-05-02 DIAGNOSIS — R079 Chest pain, unspecified: Secondary | ICD-10-CM

## 2017-05-02 NOTE — Progress Notes (Signed)
SUBJECTIVE: Patient presents for follow-up after being hospitalized for atypical chest pain after contemplating suicide.  It was reproducible with palpation and made worse with positional changes and deep breathing.  Troponins were normal.  He has a history of coronary disease with drug-eluting stent placement to the LAD in 2011 with a patent stent by cath in 2013.  Echocardiogram on 04/18/17 demonstrated moderately reduced left ventricular systolic function, LVEF 01%, grade 2 diastolic dysfunction with elevated filling pressures, and regional wall variation consistent with ischemic heart disease.  He said he continues to have left precordial chest pain.  It sometimes takes 1 or 2 nitroglycerin tablets to alleviate the pain.  He also complains of bilateral leg cramps.  He also told me his anxiety has not been under control in spite of medications prescribed to him by psychiatry.  He requests Fioricet and clonazepam.   Review of Systems: As per "subjective", otherwise negative.  Allergies  Allergen Reactions  . Ketorolac Diarrhea  . Advil [Ibuprofen] Itching  . Dilaudid [Hydromorphone] Nausea And Vomiting    Tolerates IV formulation--unable to tolerate pill form  . Mobic [Meloxicam] Other (See Comments)    Severe headaches.  . Naproxen Nausea Only  . Tramadol Hives and Itching  . Vicodin [Hydrocodone-Acetaminophen] Hives  . Cimetidine Other (See Comments) and Rash    headaches Severe headache  . Gabapentin Anxiety    "Neck twitches"  . Tape Rash    Paper tape    Current Outpatient Medications  Medication Sig Dispense Refill  . albuterol (PROVENTIL HFA;VENTOLIN HFA) 108 (90 Base) MCG/ACT inhaler Inhale 1-2 puffs into the lungs daily as needed (for shortness of breath/wheezing). 1 Inhaler 0  . aspirin EC 81 MG EC tablet Take 1 tablet (81 mg total) by mouth daily.    . clopidogrel (PLAVIX) 75 MG tablet Take 1 tablet (75 mg total) by mouth daily. 30 tablet 0  . DULoxetine  (CYMBALTA) 60 MG capsule Take 1 capsule (60 mg total) by mouth 2 (two) times daily. 60 capsule 0  . fluticasone (FLONASE) 50 MCG/ACT nasal spray Place 2 sprays into both nostrils daily as needed for allergies or rhinitis.     Marland Kitchen gabapentin (NEURONTIN) 100 MG capsule Take 2 capsules (200 mg total) by mouth 3 (three) times daily. For agitation 180 capsule 0  . glimepiride (AMARYL) 4 MG tablet Take 1 tablet (4 mg total) by mouth daily with breakfast. For diabetes 30 tablet 0  . hydrOXYzine (ATARAX/VISTARIL) 50 MG tablet Take 1 tablet (50 mg total) by mouth every 6 (six) hours as needed for anxiety. 30 tablet 0  . isosorbide mononitrate (IMDUR) 60 MG 24 hr tablet Take 1 tablet (60 mg total) by mouth daily. 30 tablet 0  . linagliptin (TRADJENTA) 5 MG TABS tablet Take 1 tablet (5 mg total) by mouth daily. 30 tablet 0  . lisinopril (PRINIVIL,ZESTRIL) 2.5 MG tablet Take 1 tablet (2.5 mg total) by mouth daily. 30 tablet 0  . metFORMIN (GLUCOPHAGE) 1000 MG tablet Take 1 tablet (1,000 mg total) by mouth 2 (two) times daily with a meal. 60 tablet 0  . metoprolol succinate (TOPROL-XL) 25 MG 24 hr tablet Take 0.5 tablets (12.5 mg total) by mouth daily. 30 tablet 0  . nitroGLYCERIN (NITROSTAT) 0.4 MG SL tablet Place 1 tablet (0.4 mg total) under the tongue every 5 (five) minutes as needed for chest pain. 5 tablet 1  . OLANZapine (ZYPREXA) 10 MG tablet Take 1 tablet (10 mg total) by  mouth at bedtime. For mood control 30 tablet 0  . pantoprazole (PROTONIX) 40 MG tablet Take 1 tablet (40 mg total) by mouth daily. 30 tablet 0  . promethazine (PHENERGAN) 25 MG tablet Take 1 tablet (25 mg total) by mouth 2 (two) times daily as needed for nausea or vomiting. 20 tablet 0  . rosuvastatin (CRESTOR) 10 MG tablet Take 1 tablet (10 mg total) by mouth daily. 30 tablet 0  . tamsulosin (FLOMAX) 0.4 MG CAPS capsule Take 1 capsule (0.4 mg total) by mouth daily. 30 capsule 0   No current facility-administered medications for this  visit.     Past Medical History:  Diagnosis Date  . Anxiety   . Arthritis   . Basal cell cancer    Chest and back - status post skin grafting  . BPH (benign prostatic hyperplasia)   . Cardiomyopathy (HCC)    LVEF 45-50%  . COPD (chronic obstructive pulmonary disease) ([place])   . Coronary artery disease    NSTEMI - DES mid LAD December 2011 (patent December 2013)  . Dyslipidemia   . Essential hypertension, benign   . GERD (gastroesophageal reflux disease)   . Hepatitis B antibody positive 2006  . History of kidney stones   . MI (myocardial infarction) ([place])    3 MI's last in 2016.  Marland Kitchen PONV (postoperative nausea and vomiting)    pt states that zofran and scope patch did not help PONV after last surgery.  . Sleep apnea   . Type 2 diabetes mellitus ([place]) 2012  . Viral pneumonia, unspecified 2004    Past Surgical History:  Procedure Laterality Date  . APPENDECTOMY    . COLONOSCOPY N/A 08/10/2015   Procedure: COLONOSCOPY;  Surgeon: Rogene Houston, MD;  Location: [facility] SUITE;  Service: Endoscopy;  Laterality: N/A;  730  . CORONARY STENT PLACEMENT    . FINGER SURGERY Right    index, gnagrene in finger.  Marland Kitchen KNEE ARTHROSCOPY     Left  . LEFT HEART CATHETERIZATION WITH CORONARY ANGIOGRAM N/A 03/20/2012   Procedure: LEFT HEART CATHETERIZATION WITH CORONARY ANGIOGRAM;  Surgeon: Thayer Headings, MD;  Location: [facility] CATH LAB;  Service: Cardiovascular;  Laterality: N/A;  . MASS EXCISION N/A 08/23/2016   Procedure: EXCISION BASAL CELL CARCINOMA ON BACK OVER 4 CM;  Surgeon: Aviva Signs, MD;  Location: [facility] ORS;  Service: General;  Laterality: N/A;  . PORT-A-CATH REMOVAL    . PORTACATH PLACEMENT    . SKIN GRAFT SPLIT THICKNESS TRUNK    01/23/2010    Right chest;  Felicie Morn, M.D.  . VASECTOMY      Social History   Socioeconomic History  . Marital status: Married    Spouse name: Not on file  . Number of children: 2  . Years of education: Not on file  . Highest education level:  Not on file  Social Needs  . Financial resource strain: Not on file  . Food insecurity - worry: Not on file  . Food insecurity - inability: Not on file  . Transportation needs - medical: Not on file  . Transportation needs - non-medical: Not on file  Occupational History  . Occupation: IT trainer    Comment: [employer]  Tobacco Use  . Smoking status: Current Some Day Smoker    Packs/day: 0.25    Years: 35.00    Pack years: 8.75    Types: Cigarettes  . Smokeless tobacco: Former Systems developer    Types: Loss adjuster, chartered  Quit date: 08/22/2006  Substance and Sexual Activity  . Alcohol use: Yes    Alcohol/week: 1.0 - 1.5 oz    Types: 2 - 3 Standard drinks or equivalent per week    Comment: Drinks occasional beer and wine  . Drug use: No  . Sexual activity: Yes    Birth control/protection: None  Other Topics Concern  . Not on file  Social History Narrative  . Not on file     Vitals:   05/02/17 1501  BP: 130/82  Pulse: 70  SpO2: 98%  Weight: 172 lb (78 kg)  Height: 5\' 11"  (1.803 m)    Wt Readings from Last 3 Encounters:  05/02/17 172 lb (78 kg)  04/16/17 157 lb (71.2 kg)  08/23/16 158 lb (71.7 kg)     PHYSICAL EXAM General: NAD HEENT: Normal. Neck: No JVD, no thyromegaly. Lungs: Clear to auscultation bilaterally with normal respiratory effort. CV: Regular rate and rhythm, normal S1/S2, no S3/S4, no murmur. No pretibial or periankle edema.  No carotid bruit.   Abdomen: Soft, nontender, no distention.  Neurologic: Alert and oriented.  Psych: Normal affect. Skin: Normal. Musculoskeletal: No gross deformities.    ECG: Most recent ECG reviewed.   Labs: Lab Results  Component Value Date/Time   K 4.2 04/24/2017 06:05 AM   BUN 22 (H) 04/24/2017 06:05 AM   BUN 18 02/17/2014 09:14 AM   CREATININE 0.86 04/24/2017 06:05 AM   CREATININE 1.01 09/11/2012 09:03 AM   ALT 19 04/16/2017 03:59 PM   TSH 2.294 09/15/2015 08:49 PM   TSH 1.264 03/18/2012 04:02 AM   HGB  15.3 04/16/2017 03:59 PM     Lipids: Lab Results  Component Value Date/Time   LDLCALC 84 09/16/2015 04:10 AM   LDLCALC 139 (H) 11/10/2013 08:51 AM   LDLCALC 79 09/11/2012 09:03 AM   CHOL 142 09/16/2015 04:10 AM   CHOL 134 09/11/2012 09:03 AM   TRIG 112 09/16/2015 04:10 AM   TRIG 306 (H) 02/17/2014 09:14 AM   TRIG 93 09/11/2012 09:03 AM   HDL 36 (L) 09/16/2015 04:10 AM   HDL 26 (L) 02/17/2014 09:14 AM   HDL 36 (L) 09/11/2012 09:03 AM       ASSESSMENT AND PLAN: 1.  Coronary artery disease with drug-eluting stent placed in the LAD in 2011 with chest pain: I am uncertain if his chest pain is truly ischemic in etiology versus being driven by anxiety.  Patent stent by cath in 2013.  Continue aspirin, beta-blocker, Imdur, and statin.  I will discontinue Plavix. I will obtain a Lexiscan Myoview stress test to evaluate for anterior wall ischemia in the area of his LAD stent.  2.  Ischemic cardiomyopathy, LVEF 40%: Symptomatically stable without signs of heart failure.  Continue Toprol-XL and lisinopril.  No indication for diuretics at this time.  He is euvolemic.  3.  Hypertension: Blood pressure is normal.  Continue Toprol-XL and lisinopril at current doses.  4.  Hyperlipidemia: Continue Crestor 10 mg daily.  5.  Major depression: Followed by psychiatry.  I told him he should speak with his psychiatrist about Fioricet and clonazepam.    Disposition: Follow up 6 weeks   Kate Sable, M.D., F.A.C.C.

## 2017-05-02 NOTE — Patient Instructions (Signed)
Medication Instructions:   Stop Plavix.   Continue all other medications.    Labwork: none  Testing/Procedures:  Your physician has requested that you have a lexiscan myoview. For further information please visit [email or web address]. Please follow instruction sheet, as given.  Office will contact with results via phone or letter.    Follow-Up: 6 weeks   Any Other Special Instructions Will Be Listed Below (If Applicable).  If you need a refill on your cardiac medications before your next appointment, please call your pharmacy.

## 2017-05-02 NOTE — Telephone Encounter (Signed)
Pre-cert Verification for the following procedure   Lexiscan -set for 05/12/2017

## 2017-05-12 ENCOUNTER — Encounter (HOSPITAL_COMMUNITY): Payer: Self-pay

## 2017-05-12 ENCOUNTER — Telehealth: Payer: Self-pay | Admitting: *Deleted

## 2017-05-12 ENCOUNTER — Encounter (HOSPITAL_COMMUNITY)
Admission: RE | Admit: 2017-05-12 | Discharge: 2017-05-12 | Disposition: A | Discharge status: Home / Self Care | Payer: Medicare Other | Source: Ambulatory Visit | Attending: Cardiovascular Disease | Admitting: Cardiovascular Disease

## 2017-05-12 ENCOUNTER — Encounter (HOSPITAL_BASED_OUTPATIENT_CLINIC_OR_DEPARTMENT_OTHER)
Admission: RE | Admit: 2017-05-12 | Discharge: 2017-05-12 | Disposition: A | Discharge status: Home / Self Care | Payer: Medicare Other | Source: Ambulatory Visit | Attending: Cardiovascular Disease | Admitting: Cardiovascular Disease

## 2017-05-12 DIAGNOSIS — R079 Chest pain, unspecified: Secondary | ICD-10-CM

## 2017-05-12 HISTORY — DX: Heart failure, unspecified: I50.9

## 2017-05-12 HISTORY — DX: Disorder of kidney and ureter, unspecified: N28.9

## 2017-05-12 LAB — NM MYOCAR MULTI W/SPECT W/WALL MOTION / EF
CHL CUP NUCLEAR SRS: 8
CHL CUP NUCLEAR SSS: 13
CHL CUP RESTING HR STRESS: 67 {beats}/min
CSEPPHR: 89 {beats}/min
LV dias vol: 124 mL (ref 62–150)
LVSYSVOL: 61 mL
RATE: 0.67
SDS: 5
TID: 0.84

## 2017-05-12 MED ORDER — TECHNETIUM TC 99M TETROFOSMIN IV KIT
10.0000 | PACK | Freq: Once | INTRAVENOUS | Status: AC | PRN
Start: 2017-05-12 — End: 2017-05-12
  Administered 2017-05-12: 10.6 via INTRAVENOUS

## 2017-05-12 MED ORDER — SODIUM CHLORIDE 0.9% FLUSH
INTRAVENOUS | Status: AC
  Filled 2017-05-12: qty 160

## 2017-05-12 MED ORDER — SODIUM CHLORIDE 0.9% FLUSH
INTRAVENOUS | Status: AC
  Administered 2017-05-12: 10 mL via INTRAVENOUS
  Filled 2017-05-12: qty 10

## 2017-05-12 MED ORDER — TECHNETIUM TC 99M TETROFOSMIN IV KIT
30.0000 | PACK | Freq: Once | INTRAVENOUS | Status: AC | PRN
  Administered 2017-05-12: 33 via INTRAVENOUS

## 2017-05-12 MED ORDER — REGADENOSON 0.4 MG/5ML IV SOLN
INTRAVENOUS | Status: AC
  Administered 2017-05-12: 0.4 mg via INTRAVENOUS
  Filled 2017-05-12: qty 5

## 2017-05-12 NOTE — Telephone Encounter (Signed)
Notes recorded by Laurine Blazer, LPN on 2/81/1886 at 7:73 PM EST Patient notified. Copy to pmd. Follow up scheduled for 06/13/2017 with [place] office.   ------  Notes recorded by Herminio Commons, MD on 05/12/2017 at 1:16 PM EST Low risk for significant blockages.

## 2017-05-29 ENCOUNTER — Telehealth (INDEPENDENT_AMBULATORY_CARE_PROVIDER_SITE_OTHER): Payer: Self-pay | Admitting: *Deleted

## 2017-05-29 NOTE — Telephone Encounter (Signed)
Patient called requesting a refill on "Fintergen" sent to [facility] in [place]. Please advise [phone].  Patient stated Dr Laural Golden always fills this for him.

## 2017-05-30 ENCOUNTER — Other Ambulatory Visit (INDEPENDENT_AMBULATORY_CARE_PROVIDER_SITE_OTHER): Payer: Self-pay | Admitting: *Deleted

## 2017-05-30 MED ORDER — PROMETHAZINE HCL 25 MG PO TABS: 25.0000 mg | ORAL_TABLET | Freq: Every day | ORAL | 0 refills | Status: DC

## 2017-05-30 NOTE — Telephone Encounter (Signed)
Per Dr.Rehman may refill as the following. Phenergan 25 mg - take 1 by mouth daily as needed for Nausea and Vomiting. #20 no refills. This was escribed to [facility].

## 2017-06-06 ENCOUNTER — Telehealth: Payer: Self-pay | Admitting: Cardiovascular Disease

## 2017-06-06 NOTE — Telephone Encounter (Signed)
Patient called stating that [facility] took him off of Ambien last month. He is calling to see if Dr. Bronson Ing will write him a prescription for Ambien.  Please call [phone].

## 2017-06-06 NOTE — Telephone Encounter (Signed)
Patient advised that our office don't prescribe ambien prescriptions. Patient verbalized understanding.

## 2017-06-10 ENCOUNTER — Ambulatory Visit: Payer: Medicare Other | Admitting: Cardiovascular Disease

## 2017-06-13 ENCOUNTER — Ambulatory Visit: Payer: Self-pay | Admitting: Student

## 2017-06-18 ENCOUNTER — Ambulatory Visit: Payer: Self-pay | Admitting: Student

## 2017-06-21 ENCOUNTER — Other Ambulatory Visit: Payer: Self-pay | Admitting: Internal Medicine

## 2017-06-23 ENCOUNTER — Other Ambulatory Visit (INDEPENDENT_AMBULATORY_CARE_PROVIDER_SITE_OTHER): Payer: Self-pay | Admitting: *Deleted

## 2017-06-23 MED ORDER — PROMETHAZINE HCL 25 MG PO TABS: 25.0000 mg | ORAL_TABLET | Freq: Every day | ORAL | 0 refills | Status: DC

## 2017-07-16 ENCOUNTER — Telehealth (INDEPENDENT_AMBULATORY_CARE_PROVIDER_SITE_OTHER): Payer: Self-pay | Admitting: Internal Medicine

## 2017-07-16 NOTE — Telephone Encounter (Signed)
patient called needs a refill on his phenergan - sent to [facility] - his ph# [phone]

## 2017-07-17 NOTE — Telephone Encounter (Signed)
Per Dr.Rehman - patient will need to get this from his PCP. Patient has not been seen since Aug 10 2015. Patient was called and made aware.

## 2018-01-06 ENCOUNTER — Ambulatory Visit (INDEPENDENT_AMBULATORY_CARE_PROVIDER_SITE_OTHER): Payer: Medicare Other | Admitting: Urology

## 2018-01-06 DIAGNOSIS — R3912 Poor urinary stream: Secondary | ICD-10-CM

## 2018-01-06 DIAGNOSIS — N401 Enlarged prostate with lower urinary tract symptoms: Secondary | ICD-10-CM | POA: Diagnosis not present

## 2018-01-06 DIAGNOSIS — N5201 Erectile dysfunction due to arterial insufficiency: Secondary | ICD-10-CM | POA: Diagnosis not present

## 2018-01-06 DIAGNOSIS — N486 Induration penis plastica: Secondary | ICD-10-CM | POA: Diagnosis not present

## 2018-01-07 IMAGING — CR DG CHEST 1V PORT
1 series · 1 of 1 positions shown · non-contrast
Comparison: 09/15/2015 and earlier.

CLINICAL DATA: 54-year-old male with chest pain radiating to the
back for 1 day. Initial encounter. Former smoker.

EXAM:
PORTABLE CHEST 1 VIEW

[erect ap]
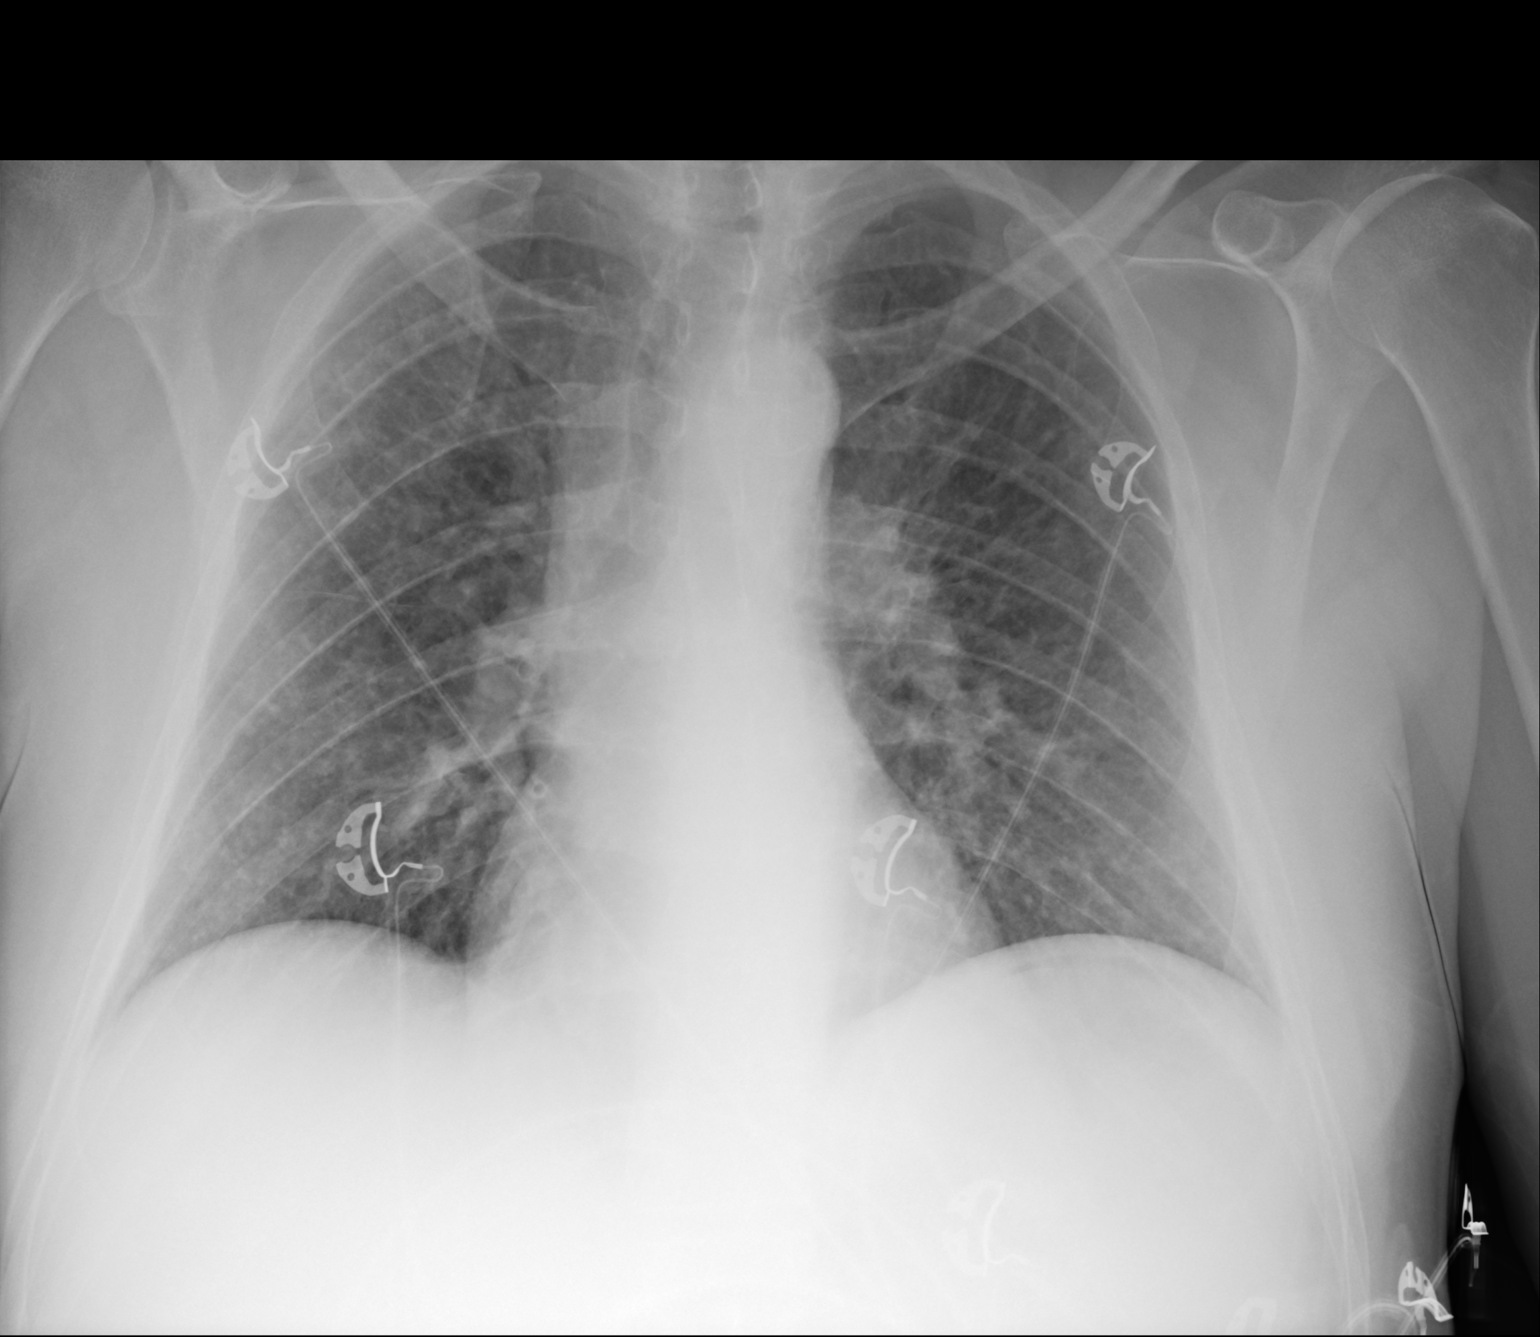

[1 of 1 positions shown; findings below may reference images not displayed]

FINDINGS: Portable AP upright view at 8053 hours. Stable lung volumes.
Mediastinal contours are stable and within normal limits. Visualized
tracheal air column is within normal limits. Mild increased
interstitial markings are stable. No pneumothorax, pleural effusion
or acute pulmonary opacity.
IMPRESSION: No acute cardiopulmonary abnormality.

## 2018-02-18 ENCOUNTER — Ambulatory Visit: Payer: Self-pay | Admitting: Urology

## 2018-07-27 ENCOUNTER — Encounter (INDEPENDENT_AMBULATORY_CARE_PROVIDER_SITE_OTHER): Payer: Self-pay | Admitting: *Deleted

## 2018-08-10 ENCOUNTER — Telehealth (INDEPENDENT_AMBULATORY_CARE_PROVIDER_SITE_OTHER): Payer: Self-pay | Admitting: *Deleted

## 2018-08-10 NOTE — Telephone Encounter (Signed)
P/C from patient, time for repeat TCS, states he had surgery for skin cancer about 2 years ago and anesthesiologist at [facility] told him he could not have any type of surgery or TCS at [facility] with anesthesia because he was too hard to wake up that he would need to go to [facility] or [place] where they have intensive care units -- please advise what we need to do   Pt ph# [phone]

## 2018-08-12 NOTE — Telephone Encounter (Signed)
Patient underwent colonoscopy with conscious sedation he did fine. Procedure could be done with conscious sedation again.

## 2018-08-13 NOTE — Telephone Encounter (Signed)
Patient aware of Dr Olevia Perches recommendations

## 2018-08-18 IMAGING — DX DG CERVICAL SPINE 2 OR 3 VIEWS
3 series · 3 of 3 positions shown · non-contrast
Comparison: None.

CLINICAL DATA: Neck pain radiating down the left arm for the last
year, no injury

EXAM:
CERVICAL SPINE - 2-3 VIEW

[c-spine lat]
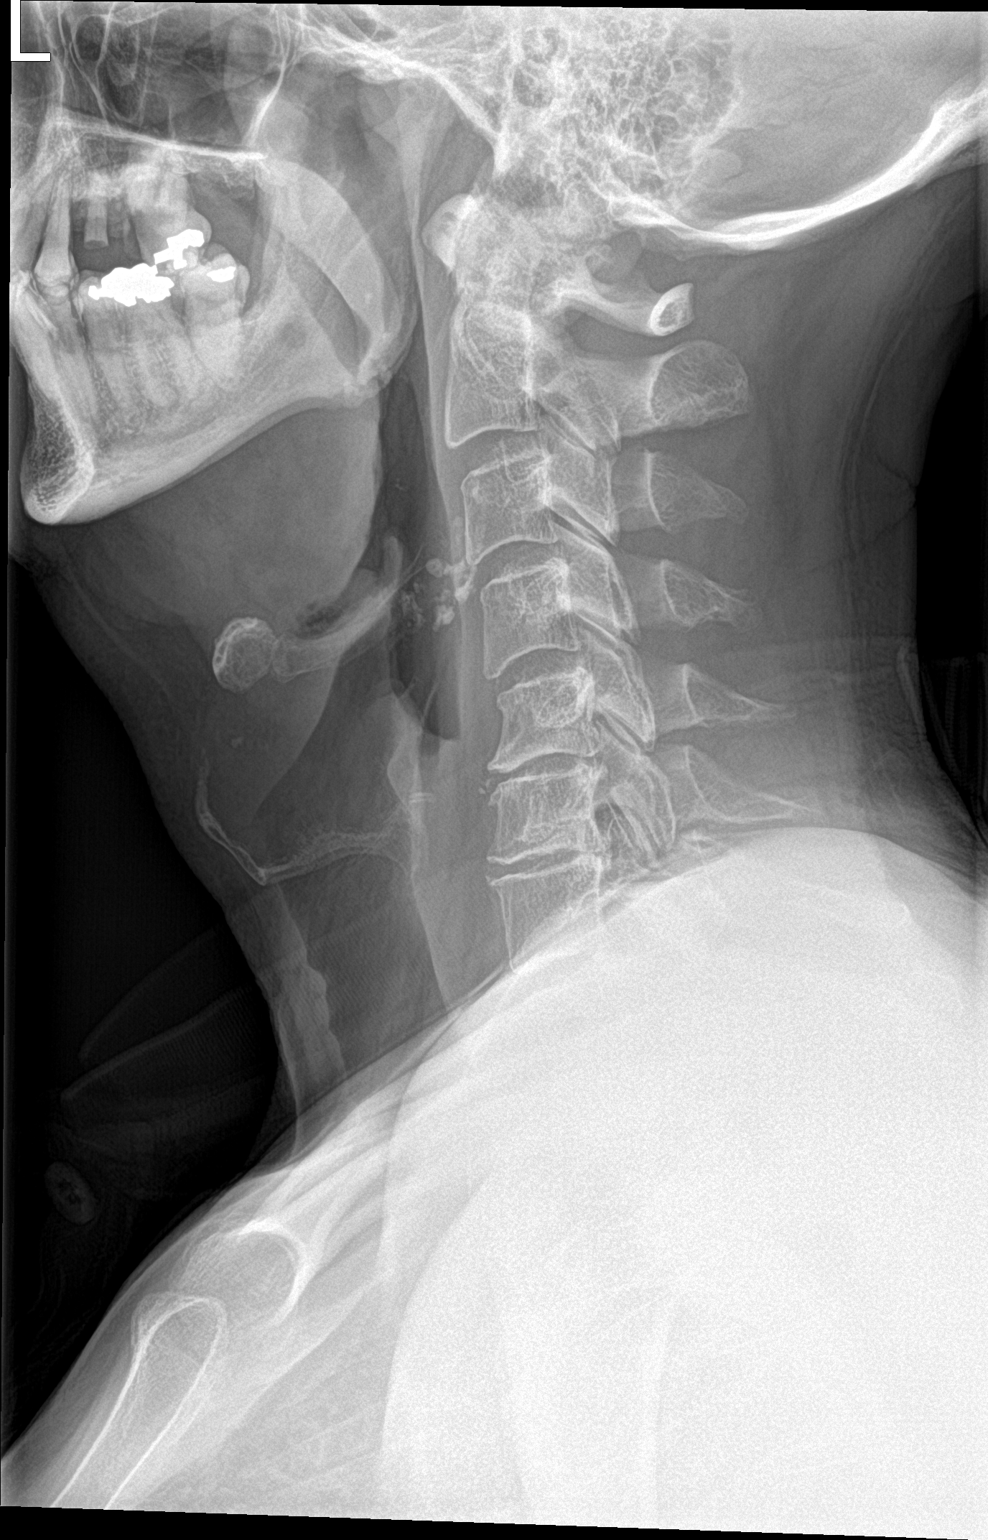

[c-spine ap]
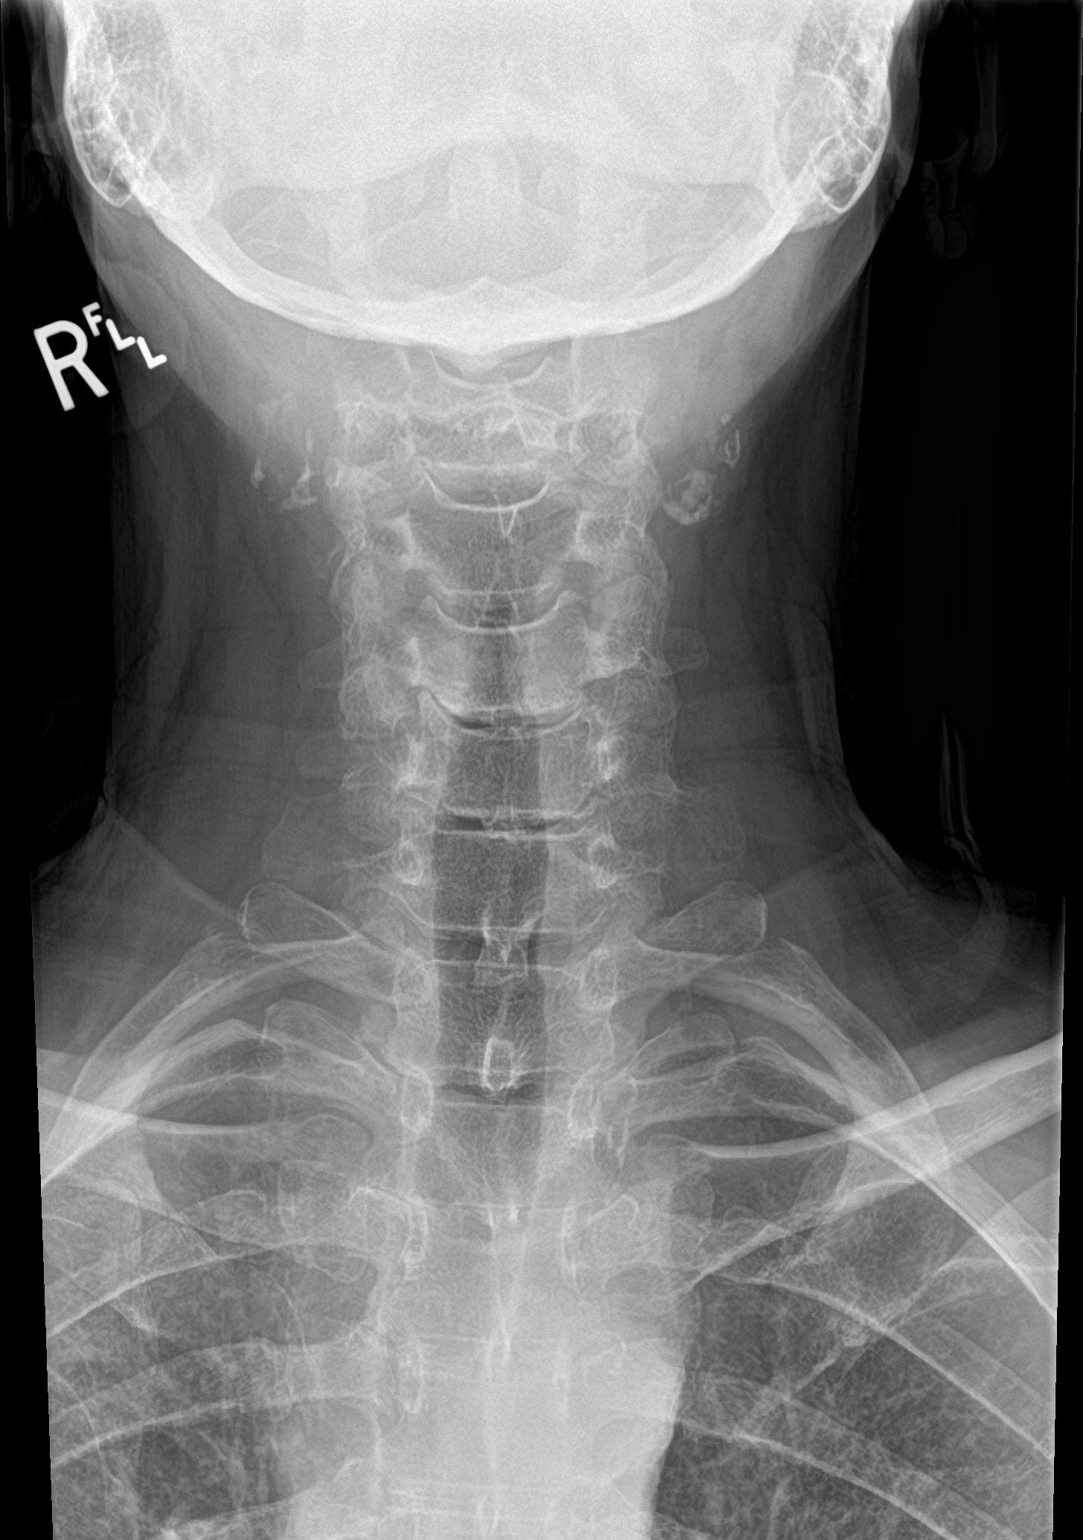

[c-spine open mouth]
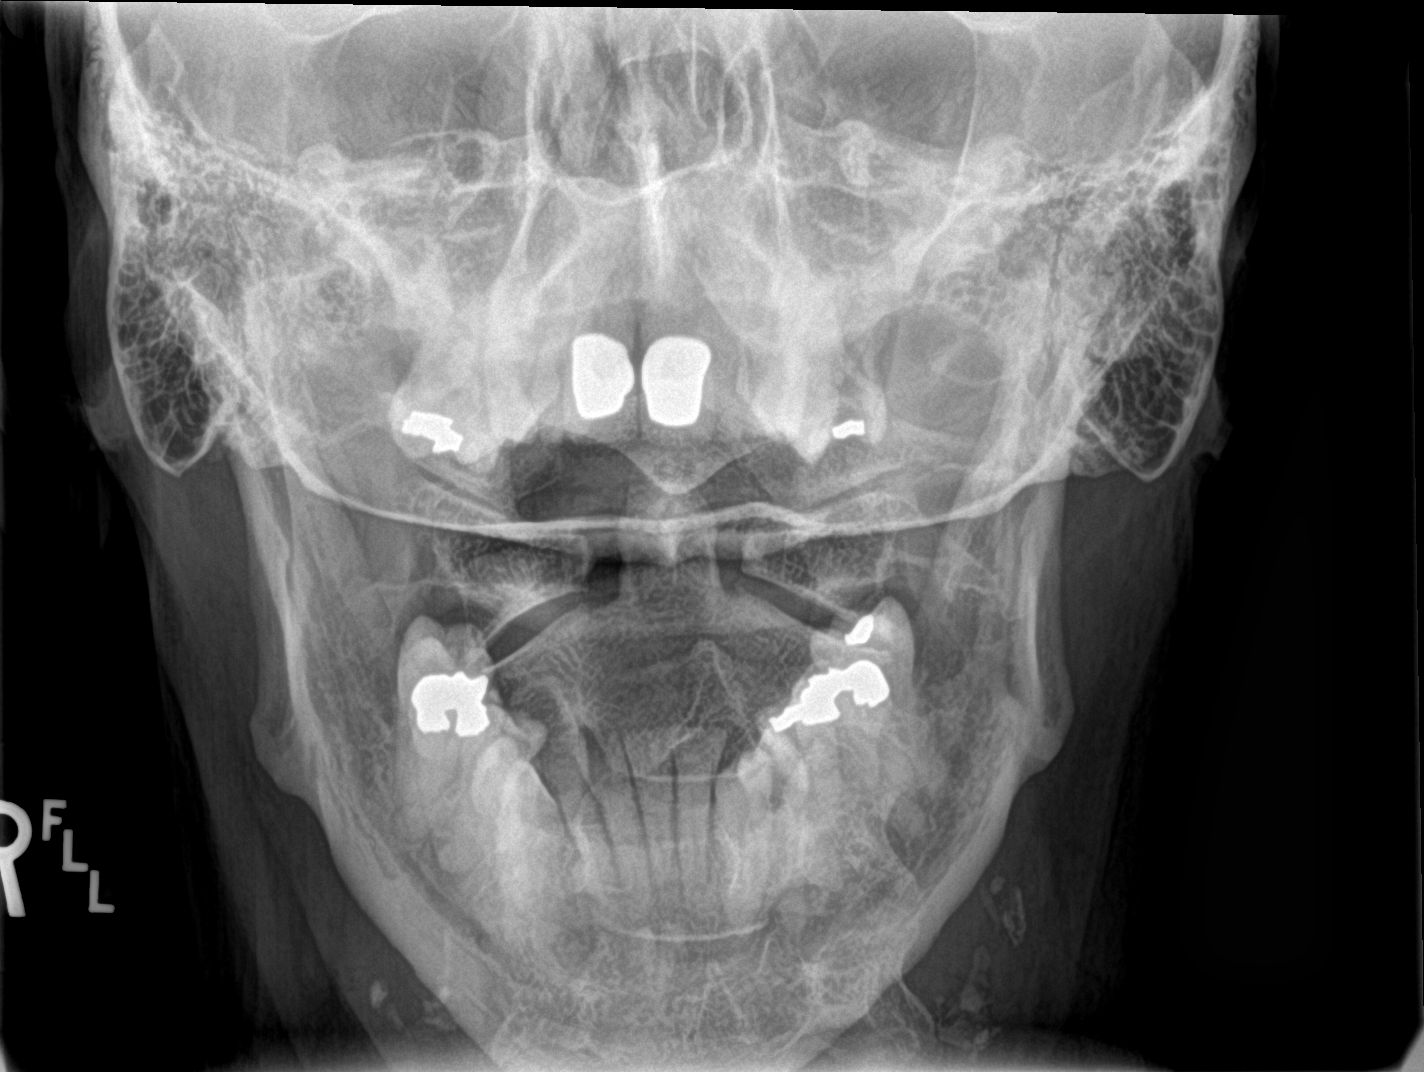

[3 of 3 positions shown; findings below may reference images not displayed]

FINDINGS: The cervical vertebrae are straightened in alignment. There is
degenerative disc disease at C5-6 and C6-7 levels. At these levels,
there is loss of disc space, sclerosis, and spurring present. No
prevertebral soft tissue swelling is seen. The odontoid process is
intact. The lung apices are clear.
IMPRESSION: 1. Normal alignment with degenerative disc disease at C5-6 and C6-7.
2. No acute abnormality.

## 2018-09-11 ENCOUNTER — Other Ambulatory Visit (HOSPITAL_BASED_OUTPATIENT_CLINIC_OR_DEPARTMENT_OTHER): Payer: Self-pay

## 2018-09-11 DIAGNOSIS — G4733 Obstructive sleep apnea (adult) (pediatric): Secondary | ICD-10-CM

## 2018-09-23 ENCOUNTER — Other Ambulatory Visit (HOSPITAL_BASED_OUTPATIENT_CLINIC_OR_DEPARTMENT_OTHER): Payer: Self-pay

## 2018-09-28 ENCOUNTER — Other Ambulatory Visit (HOSPITAL_COMMUNITY)
Admission: RE | Admit: 2018-09-28 | Discharge: 2018-09-28 | Disposition: A | Discharge status: Home / Self Care | Payer: Medicare Other | Source: Ambulatory Visit | Attending: Neurology | Admitting: Neurology

## 2018-09-28 DIAGNOSIS — Z01812 Encounter for preprocedural laboratory examination: Secondary | ICD-10-CM | POA: Insufficient documentation

## 2018-09-28 DIAGNOSIS — Z1159 Encounter for screening for other viral diseases: Secondary | ICD-10-CM | POA: Insufficient documentation

## 2018-09-29 LAB — NOVEL CORONAVIRUS, NAA (HOSP ORDER, SEND-OUT TO REF LAB; TAT 18-24 HRS): SARS-CoV-2, NAA: NOT DETECTED

## 2018-10-01 ENCOUNTER — Ambulatory Visit: Payer: Medicare Other | Attending: Otolaryngology | Admitting: Neurology

## 2018-10-01 ENCOUNTER — Other Ambulatory Visit: Payer: Self-pay

## 2018-10-01 DIAGNOSIS — Z79899 Other long term (current) drug therapy: Secondary | ICD-10-CM | POA: Insufficient documentation

## 2018-10-01 DIAGNOSIS — Z7982 Long term (current) use of aspirin: Secondary | ICD-10-CM | POA: Insufficient documentation

## 2018-10-01 DIAGNOSIS — G4733 Obstructive sleep apnea (adult) (pediatric): Secondary | ICD-10-CM | POA: Insufficient documentation

## 2018-10-01 DIAGNOSIS — Z7984 Long term (current) use of oral hypoglycemic drugs: Secondary | ICD-10-CM | POA: Diagnosis not present

## 2018-10-12 ENCOUNTER — Telehealth: Payer: Self-pay | Admitting: Cardiovascular Disease

## 2018-10-12 NOTE — Telephone Encounter (Signed)
Patient informed that this request needed to go to his PCP. Verbalized understanding.

## 2018-10-12 NOTE — Telephone Encounter (Signed)
Patient walked in asking for letter stating he needs a "service" cat.

## 2018-10-22 NOTE — Procedures (Signed)
[place] A. Merlene Laughter, MD     [email or web address]             HOME SLEEP STUDY  LOCATION: [facility]  Patient Name: Derek Chen, Derek Chen Date: 10/01/2018 Gender: Male D.O.B: 23-May-1960 Age (years): 57 Referring Provider: Melida Quitter Height (inches): 63 Interpreting Physician: Phillips Odor MD, ABSM Weight (lbs): 172 RPSGT: Peak, Robert BMI: 24 MRN: [id number] Neck Size: CLINICAL INFORMATION Sleep Study Type: HST     Indication for sleep study: N/A     Epworth Sleepiness Score: NA  SLEEP STUDY TECHNIQUE A multi-channel overnight portable sleep study was performed. The channels recorded were: nasal airflow, thoracic respiratory movement, and oxygen saturation with a pulse oximetry. Snoring was also monitored.  MEDICATIONS  Current Outpatient Medications:  .  albuterol (PROVENTIL HFA;VENTOLIN HFA) 108 (90 Base) MCG/ACT inhaler, Inhale 1-2 puffs into the lungs daily as needed (for shortness of breath/wheezing)., Disp: 1 Inhaler, Rfl: 0 .  aspirin EC 81 MG EC tablet, Take 1 tablet (81 mg total) by mouth daily., Disp: , Rfl:  .  DULoxetine (CYMBALTA) 60 MG capsule, Take 1 capsule (60 mg total) by mouth 2 (two) times daily., Disp: 60 capsule, Rfl: 0 .  fluticasone (FLONASE) 50 MCG/ACT nasal spray, Place 2 sprays into both nostrils daily as needed for allergies or rhinitis. , Disp: , Rfl:  .  gabapentin (NEURONTIN) 100 MG capsule, Take 2 capsules (200 mg total) by mouth 3 (three) times daily. For agitation, Disp: 180 capsule, Rfl: 0 .  glimepiride (AMARYL) 4 MG tablet, Take 1 tablet (4 mg total) by mouth daily with breakfast. For diabetes, Disp: 30 tablet, Rfl: 0 .  hydrOXYzine (ATARAX/VISTARIL) 50 MG tablet, Take 1 tablet (50 mg total) by mouth every 6 (six) hours as needed for anxiety., Disp: 30 tablet, Rfl: 0 .  isosorbide mononitrate (IMDUR) 60 MG 24 hr tablet, Take 1 tablet (60 mg total) by mouth daily., Disp: 30 tablet, Rfl: 0 .  linagliptin  (TRADJENTA) 5 MG TABS tablet, Take 1 tablet (5 mg total) by mouth daily., Disp: 30 tablet, Rfl: 0 .  lisinopril (PRINIVIL,ZESTRIL) 2.5 MG tablet, Take 1 tablet (2.5 mg total) by mouth daily., Disp: 30 tablet, Rfl: 0 .  metFORMIN (GLUCOPHAGE) 1000 MG tablet, Take 1 tablet (1,000 mg total) by mouth 2 (two) times daily with a meal., Disp: 60 tablet, Rfl: 0 .  metoprolol succinate (TOPROL-XL) 25 MG 24 hr tablet, Take 0.5 tablets (12.5 mg total) by mouth daily., Disp: 30 tablet, Rfl: 0 .  nitroGLYCERIN (NITROSTAT) 0.4 MG SL tablet, Place 1 tablet (0.4 mg total) under the tongue every 5 (five) minutes as needed for chest pain., Disp: 5 tablet, Rfl: 1 .  OLANZapine (ZYPREXA) 10 MG tablet, Take 1 tablet (10 mg total) by mouth at bedtime. For mood control, Disp: 30 tablet, Rfl: 0 .  pantoprazole (PROTONIX) 40 MG tablet, Take 1 tablet (40 mg total) by mouth daily., Disp: 30 tablet, Rfl: 0 .  promethazine (PHENERGAN) 25 MG tablet, Take 1 tablet (25 mg total) by mouth 2 (two) times daily as needed for nausea or vomiting., Disp: 20 tablet, Rfl: 0 .  promethazine (PHENERGAN) 25 MG tablet, Take 1 tablet (25 mg total) by mouth daily. for Nausea and Vomiting., Disp: 20 tablet, Rfl: 0 .  promethazine (PHENERGAN) 25 MG tablet, Take 1 tablet (25 mg total) by mouth daily., Disp: 12 tablet, Rfl: 0 .  rosuvastatin (CRESTOR) 10 MG tablet, Take 1 tablet (10 mg total) by mouth daily., Disp:  30 tablet, Rfl: 0 .  tamsulosin (FLOMAX) 0.4 MG CAPS capsule, Take 1 capsule (0.4 mg total) by mouth daily., Disp: 30 capsule, Rfl: 0   SLEEP ARCHITECTURE Patient was studied for 323.4 minutes. The sleep efficiency was 77.0 % and the patient was supine for 96.6%. The arousal index was 0.0 per hour.  RESPIRATORY PARAMETERS The overall AHI was 16.9 per hour, with a central apnea index of 0.0 per hour.  The oxygen nadir was 82% during sleep.     CARDIAC DATA Mean heart rate during sleep was 70.1 bpm.  IMPRESSIONS Moderate  obstructive sleep apnea occurred during this study (AHI = 16.9/h).  AutoPap 5-13 is recommended.   Delano Metz, MD Diplomate, American Board of Sleep Medicine.  ELECTRONICALLY SIGNED ON:  10/22/2018, 6:23 PM [place] PH: [phone]   FX: [phone] [place]

## 2018-11-24 NOTE — Progress Notes (Signed)
Chart reviewed with Dr Marcell Barlow including cardiac notes and ECHO, pt OK for [facility]

## 2018-11-25 ENCOUNTER — Encounter (HOSPITAL_BASED_OUTPATIENT_CLINIC_OR_DEPARTMENT_OTHER): Payer: Self-pay | Admitting: *Deleted

## 2018-11-25 ENCOUNTER — Other Ambulatory Visit: Payer: Self-pay

## 2018-11-26 ENCOUNTER — Encounter (HOSPITAL_BASED_OUTPATIENT_CLINIC_OR_DEPARTMENT_OTHER)
Admission: RE | Admit: 2018-11-26 | Discharge: 2018-11-26 | Disposition: A | Discharge status: Home / Self Care | Payer: Medicare Other | Source: Ambulatory Visit | Attending: Otolaryngology | Admitting: Otolaryngology

## 2018-11-26 ENCOUNTER — Other Ambulatory Visit (HOSPITAL_COMMUNITY)
Admission: RE | Admit: 2018-11-26 | Discharge: 2018-11-26 | Disposition: A | Discharge status: Home / Self Care | Payer: Medicare Other | Source: Ambulatory Visit | Attending: Otolaryngology | Admitting: Otolaryngology

## 2018-11-26 DIAGNOSIS — Z20828 Contact with and (suspected) exposure to other viral communicable diseases: Secondary | ICD-10-CM | POA: Diagnosis not present

## 2018-11-26 DIAGNOSIS — Z01812 Encounter for preprocedural laboratory examination: Secondary | ICD-10-CM | POA: Insufficient documentation

## 2018-11-26 DIAGNOSIS — G4733 Obstructive sleep apnea (adult) (pediatric): Secondary | ICD-10-CM | POA: Diagnosis not present

## 2018-11-26 LAB — BASIC METABOLIC PANEL
Anion gap: 11 (ref 5–15)
BUN: 19 mg/dL (ref 6–20)
CO2: 21 mmol/L — ABNORMAL LOW (ref 22–32)
Calcium: 9.2 mg/dL (ref 8.9–10.3)
Chloride: 102 mmol/L (ref 98–111)
Creatinine, Ser: 0.93 mg/dL (ref 0.61–1.24)
GFR calc Af Amer: >60 mL/min (ref >60)
GFR calc non Af Amer: >60 mL/min (ref >60)
Glucose, Bld: 203 mg/dL — ABNORMAL HIGH (ref 70–99)
Potassium: 4.8 mmol/L (ref 3.5–5.1)
Sodium: 134 mmol/L — ABNORMAL LOW (ref 135–145)

## 2018-11-26 LAB — SARS CORONAVIRUS 2 (TAT 6-24 HRS): SARS Coronavirus 2: NEGATIVE

## 2018-11-26 NOTE — Progress Notes (Signed)
EKG reviewed by Dr. Hollis, will proceed with surgery as scheduled.  

## 2018-11-27 ENCOUNTER — Other Ambulatory Visit: Payer: Self-pay | Admitting: Otolaryngology

## 2018-11-30 ENCOUNTER — Ambulatory Visit (HOSPITAL_BASED_OUTPATIENT_CLINIC_OR_DEPARTMENT_OTHER)
Admission: RE | Admit: 2018-11-30 | Discharge: 2018-11-30 | Disposition: A | Discharge status: Home / Self Care | Payer: Medicare Other | Source: Ambulatory Visit | Attending: Otolaryngology | Admitting: Otolaryngology

## 2018-11-30 ENCOUNTER — Ambulatory Visit (HOSPITAL_BASED_OUTPATIENT_CLINIC_OR_DEPARTMENT_OTHER): Payer: Medicare Other | Admitting: Certified Registered"

## 2018-11-30 ENCOUNTER — Encounter (HOSPITAL_BASED_OUTPATIENT_CLINIC_OR_DEPARTMENT_OTHER)
Admission: RE | Disposition: A | Discharge status: Home / Self Care | Payer: Self-pay | Source: Ambulatory Visit | Attending: Otolaryngology

## 2018-11-30 ENCOUNTER — Encounter (HOSPITAL_BASED_OUTPATIENT_CLINIC_OR_DEPARTMENT_OTHER): Payer: Self-pay | Admitting: Anesthesiology

## 2018-11-30 ENCOUNTER — Other Ambulatory Visit: Payer: Self-pay

## 2018-11-30 DIAGNOSIS — I509 Heart failure, unspecified: Secondary | ICD-10-CM | POA: Insufficient documentation

## 2018-11-30 DIAGNOSIS — Z886 Allergy status to analgesic agent status: Secondary | ICD-10-CM | POA: Diagnosis not present

## 2018-11-30 DIAGNOSIS — Z7982 Long term (current) use of aspirin: Secondary | ICD-10-CM | POA: Diagnosis not present

## 2018-11-30 DIAGNOSIS — I252 Old myocardial infarction: Secondary | ICD-10-CM | POA: Diagnosis not present

## 2018-11-30 DIAGNOSIS — Z85828 Personal history of other malignant neoplasm of skin: Secondary | ICD-10-CM | POA: Insufficient documentation

## 2018-11-30 DIAGNOSIS — E785 Hyperlipidemia, unspecified: Secondary | ICD-10-CM | POA: Insufficient documentation

## 2018-11-30 DIAGNOSIS — Z7984 Long term (current) use of oral hypoglycemic drugs: Secondary | ICD-10-CM | POA: Insufficient documentation

## 2018-11-30 DIAGNOSIS — I429 Cardiomyopathy, unspecified: Secondary | ICD-10-CM | POA: Insufficient documentation

## 2018-11-30 DIAGNOSIS — F419 Anxiety disorder, unspecified: Secondary | ICD-10-CM | POA: Insufficient documentation

## 2018-11-30 DIAGNOSIS — I2510 Atherosclerotic heart disease of native coronary artery without angina pectoris: Secondary | ICD-10-CM | POA: Insufficient documentation

## 2018-11-30 DIAGNOSIS — F17210 Nicotine dependence, cigarettes, uncomplicated: Secondary | ICD-10-CM | POA: Insufficient documentation

## 2018-11-30 DIAGNOSIS — Z955 Presence of coronary angioplasty implant and graft: Secondary | ICD-10-CM | POA: Diagnosis not present

## 2018-11-30 DIAGNOSIS — Z888 Allergy status to other drugs, medicaments and biological substances status: Secondary | ICD-10-CM | POA: Insufficient documentation

## 2018-11-30 DIAGNOSIS — Z885 Allergy status to narcotic agent status: Secondary | ICD-10-CM | POA: Insufficient documentation

## 2018-11-30 DIAGNOSIS — G4733 Obstructive sleep apnea (adult) (pediatric): Secondary | ICD-10-CM | POA: Diagnosis present

## 2018-11-30 DIAGNOSIS — I110 Hypertensive heart disease with heart failure: Secondary | ICD-10-CM | POA: Diagnosis not present

## 2018-11-30 DIAGNOSIS — M1990 Unspecified osteoarthritis, unspecified site: Secondary | ICD-10-CM | POA: Insufficient documentation

## 2018-11-30 DIAGNOSIS — J449 Chronic obstructive pulmonary disease, unspecified: Secondary | ICD-10-CM | POA: Insufficient documentation

## 2018-11-30 DIAGNOSIS — K219 Gastro-esophageal reflux disease without esophagitis: Secondary | ICD-10-CM | POA: Insufficient documentation

## 2018-11-30 DIAGNOSIS — Z79899 Other long term (current) drug therapy: Secondary | ICD-10-CM | POA: Diagnosis not present

## 2018-11-30 DIAGNOSIS — N400 Enlarged prostate without lower urinary tract symptoms: Secondary | ICD-10-CM | POA: Diagnosis not present

## 2018-11-30 DIAGNOSIS — E119 Type 2 diabetes mellitus without complications: Secondary | ICD-10-CM | POA: Insufficient documentation

## 2018-11-30 HISTORY — PX: DRUG INDUCED ENDOSCOPY: SHX6808

## 2018-11-30 LAB — GLUCOSE, CAPILLARY
Glucose-Capillary: 200 mg/dL — ABNORMAL HIGH (ref 70–99)
Glucose-Capillary: 181 mg/dL — ABNORMAL HIGH (ref 70–99)

## 2018-11-30 SURGERY — DRUG INDUCED SLEEP ENDOSCOPY
Anesthesia: Monitor Anesthesia Care | Site: Nose

## 2018-11-30 MED ORDER — FENTANYL CITRATE (PF) 100 MCG/2ML IJ SOLN: 25.0000 ug | INTRAMUSCULAR | Status: DC | PRN

## 2018-11-30 MED ORDER — SCOPOLAMINE 1 MG/3DAYS TD PT72: 1.0000 | MEDICATED_PATCH | Freq: Once | TRANSDERMAL | Status: DC

## 2018-11-30 MED ORDER — PROMETHAZINE HCL 25 MG/ML IJ SOLN: 6.2500 mg | INTRAMUSCULAR | Status: DC | PRN

## 2018-11-30 MED ORDER — MIDAZOLAM HCL 2 MG/2ML IJ SOLN: 1.0000 mg | INTRAMUSCULAR | Status: DC | PRN

## 2018-11-30 MED ORDER — LACTATED RINGERS IV SOLN
INTRAVENOUS | Status: DC
  Administered 2018-11-30: 09:00:00 via INTRAVENOUS

## 2018-11-30 MED ORDER — PROPOFOL 500 MG/50ML IV EMUL
INTRAVENOUS | Status: DC | PRN
  Administered 2018-11-30: 35 ug/kg/min via INTRAVENOUS

## 2018-11-30 MED ORDER — MEPERIDINE HCL 25 MG/ML IJ SOLN: 6.2500 mg | INTRAMUSCULAR | Status: DC | PRN

## 2018-11-30 MED ORDER — ACETAMINOPHEN 325 MG PO TABS: 325.0000 mg | ORAL_TABLET | Freq: Once | ORAL | Status: DC | PRN

## 2018-11-30 MED ORDER — ONDANSETRON HCL 4 MG/2ML IJ SOLN
INTRAMUSCULAR | Status: DC | PRN
  Administered 2018-11-30: 4 mg via INTRAVENOUS

## 2018-11-30 MED ORDER — OXYMETAZOLINE HCL 0.05 % NA SOLN
NASAL | Status: DC | PRN
  Administered 2018-11-30: 1 via TOPICAL

## 2018-11-30 MED ORDER — ACETAMINOPHEN 160 MG/5ML PO SOLN: 325.0000 mg | Freq: Once | ORAL | Status: DC | PRN

## 2018-11-30 MED ORDER — LACTATED RINGERS IV SOLN: INTRAVENOUS | Status: DC

## 2018-11-30 MED ORDER — FENTANYL CITRATE (PF) 100 MCG/2ML IJ SOLN: 50.0000 ug | INTRAMUSCULAR | Status: DC | PRN

## 2018-11-30 MED ORDER — LIDOCAINE 2% (20 MG/ML) 5 ML SYRINGE
INTRAMUSCULAR | Status: DC | PRN
  Administered 2018-11-30: 60 mg via INTRAVENOUS

## 2018-11-30 MED ORDER — ACETAMINOPHEN 10 MG/ML IV SOLN: 1000.0000 mg | Freq: Once | INTRAVENOUS | Status: DC | PRN

## 2018-11-30 SURGICAL SUPPLY — 16 items
CANISTER SUCT 1200ML W/VALVE (MISCELLANEOUS) ×1 IMPLANT
COVER WAND RF STERILE (DRAPES) IMPLANT
DRAPE HALF SHEET 70X43 (DRAPES) ×2 IMPLANT
GLOVE BIO SURGEON STRL SZ7.5 (GLOVE) ×3 IMPLANT
GLOVE BIOGEL PI IND STRL 7.0 (GLOVE) IMPLANT
GLOVE BIOGEL PI INDICATOR 7.0 (GLOVE) ×2
KIT CLEAN ENDO (MISCELLANEOUS) ×3 IMPLANT
NDL PRECISIONGLIDE 27X1.5 (NEEDLE) IMPLANT
NEEDLE PRECISIONGLIDE 27X1.5 (NEEDLE) IMPLANT
PACK BASIN DAY SURGERY FS (CUSTOM PROCEDURE TRAY) ×3 IMPLANT
PATTIES SURGICAL .5 X3 (DISPOSABLE) ×3 IMPLANT
SOLUTION ANTI FOG 6CC (MISCELLANEOUS) ×3 IMPLANT
SYR CONTROL 10ML LL (SYRINGE) IMPLANT
TOWEL GREEN STERILE FF (TOWEL DISPOSABLE) ×3 IMPLANT
TUBE CONNECTING 20'X1/4 (TUBING) ×1
TUBE CONNECTING 20X1/4 (TUBING) ×2 IMPLANT

## 2018-11-30 NOTE — Brief Op Note (Signed)
11/30/2018  9:20 AM  PATIENT:  Ricky Stabs  58 y.o. male  PRE-OPERATIVE DIAGNOSIS:  OBSTRUCTIVE SLEEP APNEA  POST-OPERATIVE DIAGNOSIS:  OBSTRUCTIVE SLEEP APNEA  PROCEDURE:  Procedure(s): DRUG INDUCED SLEEP ENDOSCOPY (N/A)  SURGEON:  Surgeon(s) and Role:    Melida Quitter, MD - Primary  PHYSICIAN ASSISTANT:   ASSISTANTS: none   ANESTHESIA:   IV sedation  EBL: None  BLOOD ADMINISTERED:none  DRAINS: none   LOCAL MEDICATIONS USED:  NONE  SPECIMEN:  No Specimen  DISPOSITION OF SPECIMEN:  N/A  COUNTS:  YES  TOURNIQUET:  * No tourniquets in log *  DICTATION: .Other Dictation: Dictation Number [id number]  PLAN OF CARE: Discharge to home after PACU  PATIENT DISPOSITION:  PACU - hemodynamically stable.   Delay start of Pharmacological VTE agent (>24hrs) due to surgical blood loss or risk of bleeding: no

## 2018-11-30 NOTE — Discharge Instructions (Signed)

## 2018-11-30 NOTE — H&P (Signed)
Derek Chen is an 58 y.o. male.   Chief Complaint: Sleep apnea HPI: 58 year old male with obstructive sleep apnea who has had difficulty tolerating CPAP.   He presents for sleep endoscopy.  AHI 17 on recent home sleep study.  Past Medical History:  Diagnosis Date  . Anxiety   . Arthritis   . Basal cell cancer    Chest and back - status post skin grafting  . BPH (benign prostatic hyperplasia)   . Cardiomyopathy (HCC)    LVEF 45-50%  . CHF (congestive heart failure) ([place])   . COPD (chronic obstructive pulmonary disease) ([place])   . Coronary artery disease    NSTEMI - DES mid LAD December 2011 (patent December 2013)  . Dyslipidemia   . Essential hypertension, benign   . GERD (gastroesophageal reflux disease)   . Hepatitis B antibody positive 2006  . History of kidney stones   . MI (myocardial infarction) ([place])    3 MI's last in 2016.  Marland Kitchen PONV (postoperative nausea and vomiting)    pt states that zofran and scope patch did not help PONV after last surgery.  . Renal insufficiency   . Sleep apnea   . Type 2 diabetes mellitus ([place]) 2012  . Viral pneumonia, unspecified 2004    Past Surgical History:  Procedure Laterality Date  . APPENDECTOMY    . COLONOSCOPY N/A 08/10/2015   Procedure: COLONOSCOPY;  Surgeon: Rogene Houston, MD;  Location: [facility];  Service: Endoscopy;  Laterality: N/A;  730  . CORONARY STENT PLACEMENT    . FINGER SURGERY Right    index, gnagrene in finger.  Marland Kitchen KNEE ARTHROSCOPY     Left  . LEFT HEART CATHETERIZATION WITH CORONARY ANGIOGRAM N/A 03/20/2012   Procedure: LEFT HEART CATHETERIZATION WITH CORONARY ANGIOGRAM;  Surgeon: Thayer Headings, MD;  Location: [facility] CATH LAB;  Service: Cardiovascular;  Laterality: N/A;  . MASS EXCISION N/A 08/23/2016   Procedure: EXCISION BASAL CELL CARCINOMA ON BACK OVER 4 CM;  Surgeon: Aviva Signs, MD;  Location: [facility] ORS;  Service: General;  Laterality: N/A;  . PORT-A-CATH REMOVAL    . PORTACATH PLACEMENT    . SKIN GRAFT  SPLIT THICKNESS TRUNK    01/23/2010    Right chest;  Felicie Morn, M.D.  . VASECTOMY      Family History  Problem Relation Age of Onset  . Diabetes Mother   . Deep vein thrombosis Mother   . Pulmonary embolism Mother   . Cancer Father        hodgins lymphoma  . Diabetes Sister    Social History:  reports that he has been smoking cigarettes. He has a 17.50 pack-year smoking history. He quit smokeless tobacco use about 12 years ago.  His smokeless tobacco use included chew. He reports current alcohol use of about 2.0 - 3.0 standard drinks of alcohol per week. He reports that he does not use drugs.  Allergies:  Allergies  Allergen Reactions  . Ketorolac Diarrhea  . Advil [Ibuprofen] Itching  . Dilaudid [Hydromorphone] Nausea And Vomiting    Tolerates IV formulation--unable to tolerate pill form  . Mobic [Meloxicam] Other (See Comments)    Severe headaches.  . Naproxen Nausea Only  . Tramadol Hives and Itching  . Vicodin [Hydrocodone-Acetaminophen] Hives  . Cimetidine Other (See Comments) and Rash    headaches Severe headache  . Gabapentin Anxiety    "Neck twitches"  . Tape Rash    Paper tape    Medications  Prior to Admission  Medication Sig Dispense Refill  . albuterol (PROVENTIL HFA;VENTOLIN HFA) 108 (90 Base) MCG/ACT inhaler Inhale 1-2 puffs into the lungs daily as needed (for shortness of breath/wheezing). 1 Inhaler 0  . aspirin EC 81 MG EC tablet Take 1 tablet (81 mg total) by mouth daily.    . DULoxetine (CYMBALTA) 60 MG capsule Take 1 capsule (60 mg total) by mouth 2 (two) times daily. 60 capsule 0  . fluticasone (FLONASE) 50 MCG/ACT nasal spray Place 2 sprays into both nostrils daily as needed for allergies or rhinitis.     Marland Kitchen glimepiride (AMARYL) 4 MG tablet Take 1 tablet (4 mg total) by mouth daily with breakfast. For diabetes 30 tablet 0  . hydrOXYzine (ATARAX/VISTARIL) 50 MG tablet Take 1 tablet (50 mg total) by mouth every 6 (six) hours as needed for anxiety.  30 tablet 0  . isosorbide mononitrate (IMDUR) 60 MG 24 hr tablet Take 1 tablet (60 mg total) by mouth daily. 30 tablet 0  . linagliptin (TRADJENTA) 5 MG TABS tablet Take 1 tablet (5 mg total) by mouth daily. 30 tablet 0  . lisinopril (PRINIVIL,ZESTRIL) 2.5 MG tablet Take 1 tablet (2.5 mg total) by mouth daily. 30 tablet 0  . metFORMIN (GLUCOPHAGE) 1000 MG tablet Take 1 tablet (1,000 mg total) by mouth 2 (two) times daily with a meal. 60 tablet 0  . metoprolol succinate (TOPROL-XL) 25 MG 24 hr tablet Take 0.5 tablets (12.5 mg total) by mouth daily. 30 tablet 0  . OLANZapine (ZYPREXA) 10 MG tablet Take 1 tablet (10 mg total) by mouth at bedtime. For mood control 30 tablet 0  . pantoprazole (PROTONIX) 40 MG tablet Take 1 tablet (40 mg total) by mouth daily. 30 tablet 0  . promethazine (PHENERGAN) 25 MG tablet Take 1 tablet (25 mg total) by mouth daily. for Nausea and Vomiting. 20 tablet 0  . rosuvastatin (CRESTOR) 10 MG tablet Take 1 tablet (10 mg total) by mouth daily. 30 tablet 0  . tamsulosin (FLOMAX) 0.4 MG CAPS capsule Take 1 capsule (0.4 mg total) by mouth daily. 30 capsule 0  . nitroGLYCERIN (NITROSTAT) 0.4 MG SL tablet Place 1 tablet (0.4 mg total) under the tongue every 5 (five) minutes as needed for chest pain. 5 tablet 1    No results found for this or any previous visit (from the past 48 hour(s)). No results found.  Review of Systems  All other systems reviewed and are negative.   Height 5\' 11"  (1.803 m), weight 77.1 kg. Physical Exam  Constitutional: He is oriented to person, place, and time. He appears well-developed and well-nourished. No distress.  HENT:  Head: Normocephalic and atraumatic.  Right Ear: External ear normal.  Left Ear: External ear normal.  Nose: Nose normal.  Mouth/Throat: Oropharynx is clear and moist.  Eyes: Pupils are equal, round, and reactive to light. Conjunctivae and EOM are normal.  Neck: Normal range of motion. Neck supple.  Cardiovascular: Normal  rate.  Respiratory: Effort normal.  Neurological: He is alert and oriented to person, place, and time. No cranial nerve deficit.  Skin: Skin is warm and dry.  Psychiatric: He has a normal mood and affect. His behavior is normal. Judgment and thought content normal.     Assessment/Plan OSA  To OR for DISE.  Melida Quitter, MD 11/30/2018, 8:33 AM

## 2018-11-30 NOTE — Anesthesia Procedure Notes (Signed)
Procedure Name: MAC Date/Time: 11/30/2018 9:18 AM Performed by: Signe Colt, CRNA Pre-anesthesia Checklist: Patient identified, Emergency Drugs available, Suction available, Patient being monitored and Timeout performed Patient Re-evaluated:Patient Re-evaluated prior to induction Oxygen Delivery Method: Simple face mask

## 2018-11-30 NOTE — Anesthesia Preprocedure Evaluation (Addendum)
Anesthesia Evaluation  Patient identified by MRN, date of birth, ID band Patient awake    Reviewed: Allergy & Precautions, NPO status , Patient's Chart, lab work & pertinent test results  History of Anesthesia Complications (+) PONV  Airway Mallampati: I  TM Distance: >3 FB Neck ROM: Full    Dental  (+) Edentulous Upper, Dental Advisory Given, Poor Dentition   Pulmonary sleep apnea , COPD, Current Smoker and Patient abstained from smoking.,     + decreased breath sounds      Cardiovascular hypertension, + CAD, + Past MI, + Cardiac Stents and +CHF  + dysrhythmias  Rhythm:Regular Rate:Normal     Neuro/Psych Anxiety Depression    GI/Hepatic GERD  ,(+) Hepatitis -, B  Endo/Other  diabetes, Type 2  Renal/GU      Musculoskeletal  (+) Arthritis ,   Abdominal Normal abdominal exam  (+)   Peds  Hematology negative hematology ROS (+)   Anesthesia Other Findings   Reproductive/Obstetrics                            Anesthesia Physical Anesthesia Plan  ASA: III  Anesthesia Plan: MAC   Post-op Pain Management:    Induction: Intravenous  PONV Risk Score and Plan:   Airway Management Planned: Natural Airway  Additional Equipment: None  Intra-op Plan:   Post-operative Plan:   Informed Consent: I have reviewed the patients History and Physical, chart, labs and discussed the procedure including the risks, benefits and alternatives for the proposed anesthesia with the patient or authorized representative who has indicated his/her understanding and acceptance.     Dental advisory given  Plan Discussed with: CRNA  Anesthesia Plan Comments:        Anesthesia Quick Evaluation

## 2018-11-30 NOTE — Op Note (Signed)
NAME: Derek Chen, Derek Chen MEDICAL RECORD [id number] ACCOUNT [id number] DATE OF BIRTH:[birth year] FACILITY: [facility] LOCATION: [facility] PHYSICIAN:Kimisha Eunice Guido Sander, MD  OPERATIVE REPORT  DATE OF PROCEDURE:  11/30/2018  PREOPERATIVE DIAGNOSIS:  Obstructive sleep apnea.  POSTOPERATIVE DIAGNOSIS:  Obstructive sleep apnea.  PROCEDURE:  Drug-induced sleep endoscopy.  SURGEON:  Melida Quitter, MD  ANESTHESIA:  IV sedation.  COMPLICATIONS:  None.  INDICATIONS:  The patient is a 58 year old male with obstructive sleep apnea with an AHI of 17 who has not been able to tolerate CPAP and is symptomatic.  He presents to the operating room for airway evaluation.  FINDINGS:  At the level of the velum, collapse was completely anterior-posterior with no lateral wall collapse.  The tongue base was in a posterior position with minimal lateral wall collapse at that level.  There were no other abnormalities seen.  DESCRIPTION OF PROCEDURE:  The patient was identified in the holding room, informed consent having been obtained including discussion of risks, benefits, and alternatives.  The patient was brought to the operative suite and put on the operative table in  supine position.  IV sedation was induced, and the patient was gradually deepened until he achieved a simulated sleep.  Afrin pledgets were placed in the left side of the nose and removed after a couple of minutes, and a flexible scope was then passed  through the left nasal passage to view the nasopharynx, oropharynx, and hypopharynx.  Findings are noted above.  The exam was recorded.  After this was completed, the scope was removed, and the patient was returned to anesthesia for wakeup and was moved  to recovery room in stable condition.  LN/NUANCE  D:11/30/2018 T:11/30/2018 JOB:[id number]

## 2018-11-30 NOTE — Transfer of Care (Signed)
Immediate Anesthesia Transfer of Care Note  Patient: Derek Chen  Procedure(s) Performed: DRUG INDUCED SLEEP ENDOSCOPY (N/A Nose)  Patient Location: PACU  Anesthesia Type:MAC  Level of Consciousness: awake, alert , oriented and patient cooperative  Airway & Oxygen Therapy: Patient Spontanous Breathing and Patient connected to face mask oxygen  Post-op Assessment: Report given to RN and Post -op Vital signs reviewed and stable  Post vital signs: Reviewed and stable  Last Vitals:  Vitals Value Taken Time  BP    Temp    Pulse    Resp    SpO2      Last Pain:  Vitals:   11/30/18 0840  TempSrc: Oral  PainSc: 0-No pain         Complications: No apparent anesthesia complications

## 2018-11-30 NOTE — Anesthesia Postprocedure Evaluation (Signed)
Anesthesia Post Note  Patient: Derek Chen  Procedure(s) Performed: DRUG INDUCED SLEEP ENDOSCOPY (N/A Nose)     Patient location during evaluation: PACU Anesthesia Type: MAC Level of consciousness: awake and alert Pain management: pain level controlled Vital Signs Assessment: post-procedure vital signs reviewed and stable Respiratory status: spontaneous breathing, nonlabored ventilation, respiratory function stable and patient connected to nasal cannula oxygen Cardiovascular status: stable and blood pressure returned to baseline Postop Assessment: no apparent nausea or vomiting Anesthetic complications: no    Last Vitals:  Vitals:   11/30/18 0930 11/30/18 0945  BP: (!) 133/98 (!) 151/84  Pulse: 64 71  Resp: 12 (!) 23  Temp:    SpO2: 100% 97%    Last Pain:  Vitals:   11/30/18 0927  TempSrc:   PainSc: 0-No pain                 Effie Berkshire

## 2018-12-01 ENCOUNTER — Encounter (HOSPITAL_BASED_OUTPATIENT_CLINIC_OR_DEPARTMENT_OTHER): Payer: Self-pay | Admitting: Otolaryngology

## 2018-12-23 ENCOUNTER — Ambulatory Visit (INDEPENDENT_AMBULATORY_CARE_PROVIDER_SITE_OTHER): Payer: Medicare Other | Admitting: Urology

## 2018-12-23 DIAGNOSIS — N401 Enlarged prostate with lower urinary tract symptoms: Secondary | ICD-10-CM

## 2018-12-23 DIAGNOSIS — N486 Induration penis plastica: Secondary | ICD-10-CM | POA: Diagnosis not present

## 2019-01-26 ENCOUNTER — Other Ambulatory Visit: Payer: Self-pay | Admitting: Otolaryngology

## 2019-02-22 ENCOUNTER — Other Ambulatory Visit (HOSPITAL_COMMUNITY)
Admission: RE | Admit: 2019-02-22 | Discharge: 2019-02-22 | Disposition: A | Discharge status: Home / Self Care | Payer: Medicare Other | Source: Ambulatory Visit | Attending: Otolaryngology | Admitting: Otolaryngology

## 2019-02-22 ENCOUNTER — Encounter (HOSPITAL_COMMUNITY): Payer: Self-pay | Admitting: Vascular Surgery

## 2019-02-22 ENCOUNTER — Encounter (HOSPITAL_COMMUNITY): Payer: Self-pay | Admitting: Anesthesiology

## 2019-02-22 ENCOUNTER — Encounter (HOSPITAL_COMMUNITY)
Admission: RE | Admit: 2019-02-22 | Discharge: 2019-02-22 | Disposition: A | Discharge status: Home / Self Care | Payer: Medicare Other | Source: Ambulatory Visit | Attending: Otolaryngology | Admitting: Otolaryngology

## 2019-02-22 ENCOUNTER — Other Ambulatory Visit: Payer: Self-pay

## 2019-02-22 ENCOUNTER — Encounter (HOSPITAL_COMMUNITY): Payer: Self-pay

## 2019-02-22 DIAGNOSIS — E119 Type 2 diabetes mellitus without complications: Secondary | ICD-10-CM | POA: Insufficient documentation

## 2019-02-22 DIAGNOSIS — Z955 Presence of coronary angioplasty implant and graft: Secondary | ICD-10-CM | POA: Insufficient documentation

## 2019-02-22 DIAGNOSIS — E785 Hyperlipidemia, unspecified: Secondary | ICD-10-CM | POA: Diagnosis not present

## 2019-02-22 DIAGNOSIS — M1990 Unspecified osteoarthritis, unspecified site: Secondary | ICD-10-CM | POA: Insufficient documentation

## 2019-02-22 DIAGNOSIS — Z01818 Encounter for other preprocedural examination: Secondary | ICD-10-CM | POA: Insufficient documentation

## 2019-02-22 DIAGNOSIS — N189 Chronic kidney disease, unspecified: Secondary | ICD-10-CM | POA: Diagnosis not present

## 2019-02-22 DIAGNOSIS — I130 Hypertensive heart and chronic kidney disease with heart failure and stage 1 through stage 4 chronic kidney disease, or unspecified chronic kidney disease: Secondary | ICD-10-CM | POA: Insufficient documentation

## 2019-02-22 DIAGNOSIS — G4733 Obstructive sleep apnea (adult) (pediatric): Secondary | ICD-10-CM | POA: Diagnosis not present

## 2019-02-22 DIAGNOSIS — Z85828 Personal history of other malignant neoplasm of skin: Secondary | ICD-10-CM | POA: Diagnosis not present

## 2019-02-22 DIAGNOSIS — K219 Gastro-esophageal reflux disease without esophagitis: Secondary | ICD-10-CM | POA: Diagnosis not present

## 2019-02-22 DIAGNOSIS — I255 Ischemic cardiomyopathy: Secondary | ICD-10-CM | POA: Diagnosis not present

## 2019-02-22 DIAGNOSIS — Z87442 Personal history of urinary calculi: Secondary | ICD-10-CM | POA: Insufficient documentation

## 2019-02-22 DIAGNOSIS — Z7902 Long term (current) use of antithrombotics/antiplatelets: Secondary | ICD-10-CM | POA: Insufficient documentation

## 2019-02-22 DIAGNOSIS — Z7984 Long term (current) use of oral hypoglycemic drugs: Secondary | ICD-10-CM | POA: Diagnosis not present

## 2019-02-22 DIAGNOSIS — F419 Anxiety disorder, unspecified: Secondary | ICD-10-CM | POA: Insufficient documentation

## 2019-02-22 DIAGNOSIS — F17200 Nicotine dependence, unspecified, uncomplicated: Secondary | ICD-10-CM | POA: Insufficient documentation

## 2019-02-22 DIAGNOSIS — J449 Chronic obstructive pulmonary disease, unspecified: Secondary | ICD-10-CM | POA: Insufficient documentation

## 2019-02-22 DIAGNOSIS — I2510 Atherosclerotic heart disease of native coronary artery without angina pectoris: Secondary | ICD-10-CM | POA: Diagnosis not present

## 2019-02-22 DIAGNOSIS — I509 Heart failure, unspecified: Secondary | ICD-10-CM | POA: Insufficient documentation

## 2019-02-22 DIAGNOSIS — N400 Enlarged prostate without lower urinary tract symptoms: Secondary | ICD-10-CM | POA: Insufficient documentation

## 2019-02-22 DIAGNOSIS — I252 Old myocardial infarction: Secondary | ICD-10-CM | POA: Insufficient documentation

## 2019-02-22 DIAGNOSIS — Z79899 Other long term (current) drug therapy: Secondary | ICD-10-CM | POA: Diagnosis not present

## 2019-02-22 DIAGNOSIS — Z7982 Long term (current) use of aspirin: Secondary | ICD-10-CM | POA: Diagnosis not present

## 2019-02-22 HISTORY — DX: Other complications of anesthesia, initial encounter: T88.59XA

## 2019-02-22 LAB — BASIC METABOLIC PANEL
Anion gap: 9 (ref 5–15)
BUN: 15 mg/dL (ref 6–20)
CO2: 22 mmol/L (ref 22–32)
Calcium: 9.4 mg/dL (ref 8.9–10.3)
Chloride: 103 mmol/L (ref 98–111)
Creatinine, Ser: 0.95 mg/dL (ref 0.61–1.24)
GFR calc Af Amer: >60 mL/min (ref >60)
GFR calc non Af Amer: >60 mL/min (ref >60)
Glucose, Bld: 325 mg/dL — ABNORMAL HIGH (ref 70–99)
Potassium: 5 mmol/L (ref 3.5–5.1)
Sodium: 134 mmol/L — ABNORMAL LOW (ref 135–145)

## 2019-02-22 LAB — CBC
HCT: 49.3 % (ref 39.0–52.0)
Hemoglobin: 16.4 g/dL (ref 13.0–17.0)
MCH: 30.1 pg (ref 26.0–34.0)
MCHC: 33.3 g/dL (ref 30.0–36.0)
MCV: 90.6 fL (ref 80.0–100.0)
Platelets: 221 10*3/uL (ref 150–400)
RBC: 5.44 MIL/uL (ref 4.22–5.81)
RDW: 12.7 % (ref 11.5–15.5)
WBC: 10.7 10*3/uL — ABNORMAL HIGH (ref 4.0–10.5)
nRBC: 0 % (ref 0.0–0.2)

## 2019-02-22 LAB — HEMOGLOBIN A1C
Hgb A1c MFr Bld: 10.2 % — ABNORMAL HIGH (ref 4.8–5.6)
Mean Plasma Glucose: 246.04 mg/dL

## 2019-02-22 LAB — SARS CORONAVIRUS 2 (TAT 6-24 HRS): SARS Coronavirus 2: NEGATIVE

## 2019-02-22 NOTE — Progress Notes (Signed)
PCP - Dr. Eulas Post Cardiologist - [facility]   Chest x-ray - n/a EKG - 11-26-18 Stress Test - 05-12-17 ECHO - 04-18-17 Cardiac Cath - 03-20-12  Sleep Study - 10-19-18 CPAP - pt states he does not wear CPAP  DM - type 2 Fasting Blood Sugar - 130-140  Blood Thinner Instructions: Plavix - last dose 02-20-19 Aspirin Instructions: last dose 02-20-19   COVID TEST- today, Nov. 23rd   Anesthesia review: yes, heart history Pt states that he is difficult to wake up after receiving anesthesia  Patient denies shortness of breath, fever, cough and chest pain at PAT appointment   All instructions explained to the patient, with a verbal understanding of the material. Patient agrees to go over the instructions while at home for a better understanding. Patient also instructed to self quarantine after being tested for COVID-19. The opportunity to ask questions was provided.

## 2019-02-22 NOTE — Progress Notes (Addendum)
Anesthesia Chart Review:  Case: [id number] Date/Time: 02/24/19 0715   Procedure: IMPLANTATION OF HYPOGLOSSAL NERVE STIMULATOR (N/A )   Anesthesia type: General   Pre-op diagnosis: G47.33 OSA (Obstructive Sleep Apnea)   Location: [place] OR ROOM 09 / [place] OR   Surgeon: Melida Quitter, MD      DISCUSSION: Patient is a 58 year old male scheduled for the above procedure. S/p drug induced sleep endoscopy on 11/30/18.  History includes smoking, post-operative N/V (resistant to Zofran and scopolamine), CAD (NSTEMI 03/29/10, s/p DES mid LAD), ischemic cardiomyopathy, CHF, COPD, DM2, HTN, dyslipidemia, skin cancer (BCC, chest and back, s/p excision with skin grafting of right chest 01/23/10), CKD, BPH, OSA (intolerant to CPAP).  Preoperative labs revealed A1c of 10.2%, consistent with average glucose of 246. He reported home fasting CBGs ~ 130-140's. PAT RN routed result to Dr. Redmond Baseman, and I left a voice message with his medical assistant regarding results and CBG goals for the day of surgery.  Known MI in 2011. He last saw his cardiologist Dr. Bronson Ing in 05/2017 following hospitalization for atypical chest pain in the setting of suicidal ideation. Question if anxiety playing a role in his symptoms. Stress test ordered and was considered low risk (moderate infarct with mild peri-infarct ischemia, EF 51%; 40% by 04/2017 echo but stable since 2017).  No additional cardiac testing ordered. He was euvolemic on exam.    Reported last Plavix and ASA 02/19/09.  He denied SOB and chest pain per PAT RN interview. He had a low risk stress test within the past two years and tolerated ENT procedure under anesthesia a few months ago. I will attempt to contact him and confirm no new or progressive symptoms since then, but if none (and fasting CBG acceptable) then I would anticipate that he can proceed as planned. Discussed with anesthesiologist Nolon Nations, MD.  COVID-19 test negative on 02/22/19. (UPDATE 02/23/19 10:24 PM: I  spoke with patient briefly this morning because he is currently at Dr. Gloriann Loan office for his appointment. He denied exertional chest pain and SOB. No symptoms since undergoing procedure in August. Notified him of A1c results and need for primary care follow-up. He reported he was hard to wake up after 2018 back/skin graft surgery at [facility]. Reviewed anesthesia records. LMA used--size 4 changed to size 5 due to leak. Non-rebreather used immediately after surgery and BP documented at 91/54, otherwise no specific complications documented that I see.)    VS: BP (!) 158/87   Pulse 87   Temp (!) 36.1 C (Oral)   Resp 20   Ht 5\' 11"  (1.803 m)   Wt 76.2 kg   SpO2 98%   BMI 23.43 kg/m    PROVIDERS: Jessee Avers, FNP is PCP  Kate Sable, MD is cardiologist   LABS: Preoperative labs noted. See DISCUSSION. (all labs ordered are listed, but only abnormal results are displayed)  Labs Reviewed  BASIC METABOLIC PANEL - Abnormal; Notable for the following components:      Result Value   Sodium 134 (*)    Glucose, Bld 325 (*)    All other components within normal limits  HEMOGLOBIN A1C - Abnormal; Notable for the following components:   Hgb A1c MFr Bld 10.2 (*)    All other components within normal limits  CBC - Abnormal; Notable for the following components:   WBC 10.7 (*)    All other components within normal limits    OTHER: Sleep Study 10/01/18 (Kofi Lidia Collum, MD): IMPRESSIONS: Moderate obstructive sleep  apnea occurred during this study (AHI = 16.9/h).  AutoPap 5-13 is recommended.    EKG: 11/26/18: Sinus rhythm with Premature atrial complexes Right bundle branch block Left anterior fascicular block ** Bifascicular block ** Abnormal ECG No significant change since last tracing Confirmed by Shelva Majestic [phone]) on 11/26/2018 5:59:13 PM    CV: Nuclear stress test 05/12/17:  There was no ST segment deviation noted during stress.  Findings consistent with prior distal  anterolateral/apical myocardial infarction. There is a moderate sized inferolateral defect with mild reversibility. There is a significant amount of gut radiotracer adjacent to this segment which may create artifact. Findings would suggest moderate infarct with mild peri-infarct ischemia  The left ventricular ejection fraction is normal (51%).  This is a low risk study.   Echo 04/18/17: Study Conclusions - Left ventricle: The cavity size was normal. Wall thickness was   normal. Systolic function was moderately reduced. The estimated   ejection fraction was 40%. Features are consistent with a   pseudonormal left ventricular filling pattern, with concomitant   abnormal relaxation and increased filling pressure (grade 2   diastolic dysfunction). - Regional wall motion abnormality: Hypokinesis of the mid   inferolateral, mid anterolateral, and apical lateral myocardium;   mild hypokinesis of the mid-apical inferior myocardium. (Comparison: LVEF 35-40%, lateral and inferior hypokinesis 09/16/15; EF 45-50%, severe hypokinesis of the mid-distal inferolateral myocardium extending inferoapically, severehypokinesis to akinesis of the distal anterolateral myocardium 03/18/12; EF 45-50% apex akinesis 03/30/10)   Cardiac cath 03/20/12: Angiography  - Left Main: minor luminal irregularities - Left anterior Descending: diffuse mild disease.  There is a long stent in the proximal LAD.  There is mild irregularities within the stent.   No significant changes from previous tracings. - Left Circumflex: moderate sized branch, 1st OM moderate irregularities.  - Right Coronary Artery: large and dominant, minor luminal irregularities.  PDA and PLSA are unremarkable.   - LV Gram: LAO and RAO views. Mildly reduced LV function - EF 40-45%. There is akinesis of a small-medium sized area of the  anterior lateral wall. This segmental wall motion abnormality was present at the time of his previous cath. - Conclusions:   Moderate CAD.  The LAD stent is patent.  His coronary arteries are unchanged from his previous cath in 2011.    Past Medical History:  Diagnosis Date  . Anxiety   . Arthritis   . Basal cell cancer    Chest and back - status post skin grafting  . BPH (benign prostatic hyperplasia)   . Cardiomyopathy (HCC)    LVEF 45-50%  . CHF (congestive heart failure) ([place])   . Complication of anesthesia    Difficult to wake up after anesthesia  . COPD (chronic obstructive pulmonary disease) ([place])   . Coronary artery disease    NSTEMI - DES mid LAD December 2011 (patent December 2013)  . Dyslipidemia   . Essential hypertension, benign   . GERD (gastroesophageal reflux disease)   . Hepatitis B antibody positive 2006  . History of kidney stones   . MI (myocardial infarction) ([place])    3 MI's last in 2016.  Marland Kitchen PONV (postoperative nausea and vomiting)    pt states that zofran and scope patch did not help PONV after last surgery.  . Renal insufficiency   . Sleep apnea   . Type 2 diabetes mellitus ([place]) 2012  . Viral pneumonia, unspecified 2004    Past Surgical History:  Procedure Laterality Date  . APPENDECTOMY    .  COLONOSCOPY N/A 08/10/2015   Procedure: COLONOSCOPY;  Surgeon: Rogene Houston, MD;  Location: [facility];  Service: Endoscopy;  Laterality: N/A;  730  . CORONARY STENT PLACEMENT    . DRUG INDUCED ENDOSCOPY N/A 11/30/2018   Procedure: DRUG INDUCED SLEEP ENDOSCOPY;  Surgeon: Melida Quitter, MD;  Location: [place];  Service: ENT;  Laterality: N/A;  . FINGER SURGERY Right    index, gnagrene in finger.  Marland Kitchen KNEE ARTHROSCOPY     Left  . LEFT HEART CATHETERIZATION WITH CORONARY ANGIOGRAM N/A 03/20/2012   Procedure: LEFT HEART CATHETERIZATION WITH CORONARY ANGIOGRAM;  Surgeon: Thayer Headings, MD;  Location: [facility] CATH LAB;  Service: Cardiovascular;  Laterality: N/A;  . MASS EXCISION N/A 08/23/2016   Procedure: EXCISION BASAL CELL CARCINOMA ON BACK OVER 4 CM;  Surgeon:  Aviva Signs, MD;  Location: [facility] ORS;  Service: General;  Laterality: N/A;  . PORT-A-CATH REMOVAL    . PORTACATH PLACEMENT    . SKIN GRAFT SPLIT THICKNESS TRUNK    01/23/2010    Right chest;  Felicie Morn, M.D.  . VASECTOMY      MEDICATIONS: . albuterol (PROVENTIL HFA;VENTOLIN HFA) 108 (90 Base) MCG/ACT inhaler  . aspirin EC 81 MG EC tablet  . Cinnamon 500 MG TABS  . clonazePAM (KLONOPIN) 1 MG tablet  . clopidogrel (PLAVIX) 75 MG tablet  . DULoxetine (CYMBALTA) 60 MG capsule  . fluticasone (FLONASE) 50 MCG/ACT nasal spray  . glimepiride (AMARYL) 4 MG tablet  . JANUVIA 100 MG tablet  . lisinopril (PRINIVIL,ZESTRIL) 2.5 MG tablet  . metFORMIN (GLUCOPHAGE) 1000 MG tablet  . metoprolol succinate (TOPROL-XL) 50 MG 24 hr tablet  . nitroGLYCERIN (NITROSTAT) 0.4 MG SL tablet  . pantoprazole (PROTONIX) 40 MG tablet  . promethazine (PHENERGAN) 25 MG tablet  . rosuvastatin (CRESTOR) 10 MG tablet  . tamsulosin (FLOMAX) 0.4 MG CAPS capsule  . zolpidem (AMBIEN) 10 MG tablet   No current facility-administered medications for this encounter.     Myra Gianotti, PA-C Surgical Short Stay/Anesthesiology [facility] Phone [phone] [facility] Phone [phone] 02/22/2019 6:55 PM

## 2019-02-22 NOTE — Anesthesia Preprocedure Evaluation (Deleted)
Anesthesia Evaluation    Airway        Dental   Pulmonary Current Smoker,           Cardiovascular hypertension,      Neuro/Psych    GI/Hepatic   Endo/Other  diabetes  Renal/GU      Musculoskeletal   Abdominal   Peds  Hematology   Anesthesia Other Findings   Reproductive/Obstetrics                             Anesthesia Physical Anesthesia Plan  ASA:   Anesthesia Plan:    Post-op Pain Management:    Induction:   PONV Risk Score and Plan:   Airway Management Planned:   Additional Equipment:   Intra-op Plan:   Post-operative Plan:   Informed Consent:   Plan Discussed with:   Anesthesia Plan Comments: (PAT note written by Myra Gianotti, PA-C.  He reported difficulty waking up from anesthesia with 2018 back/skin grafting surgery. Reviewed anesthesia records in [facility]. LMA used--size 4 changed to size 5 due to leak. Non-rebreather used immediately after surgery and BP documented at 91/54, otherwise no specific complications documented that I could see. )       Anesthesia Quick Evaluation

## 2019-02-22 NOTE — Progress Notes (Signed)
A1c 10.2 @ PAT appointment  Dr. Redmond Baseman notified via in box message.  Derek Chen called and notified.  Chart sent to anesthesia for review.

## 2019-02-22 NOTE — Pre-Procedure Instructions (Signed)
Derek Chen  02/22/2019    Your procedure is scheduled on Wednesday, February 24, 2019 at 7:30 AM.   Report to [facility] Entrance "A" Admitting Office at 5:30 AM.   Call this number if you have problems the morning of surgery: [phone]   Questions prior to day of surgery, please call [phone] between 8 & 4 PM.   Remember:  Do not eat or drink after midnight Tuesday, 02/23/19.  Take these medicines the morning of surgery with A SIP OF WATER: Clonazepam (Klonopin), Duloxetine (Cymbalta), Metoprolol (Toprol XL), Pantoprazole (Protonix), Promethazine (Phenergan), Tamsulosin (Flomax), Flonase - if needed, Nitroglycerin - if needed  Do not take Glimepiride, Januvia or Metformin the morning of surgery.   HOW TO MANAGE YOUR DIABETES BEFORE AND AFTER SURGERY  Why is it important to control my blood sugar before and after surgery? . Improving blood sugar levels before and after surgery helps healing and can limit problems. . A way of improving blood sugar control is eating a healthy diet by: o  Eating less sugar and carbohydrates o  Increasing activity/exercise o  Talking with your doctor about reaching your blood sugar goals . High blood sugars (greater than 180 mg/dL) can raise your risk of infections and slow your recovery, so you will need to focus on controlling your diabetes during the weeks before surgery. . Make sure that the doctor who takes care of your diabetes knows about your planned surgery including the date and location.  How do I manage my blood sugar before surgery? . Check your blood sugar at least 4 times a day, starting 2 days before surgery, to make sure that the level is not too high or low. . Check your blood sugar the morning of your surgery when you wake up and every 2 hours until you get to the Short Stay unit. o If your blood sugar is less than 70 mg/dL, you will need to treat for low blood sugar: - Do not take insulin. - Treat a low blood  sugar (less than 70 mg/dL) with  cup of clear juice (cranberry or apple), 4 glucose tablets, OR glucose gel. - Recheck blood sugar in 15 minutes after treatment (to make sure it is greater than 70 mg/dL). If your blood sugar is not greater than 70 mg/dL on recheck, call [phone] for further instructions. . Report your blood sugar to the short stay nurse when you get to Short Stay.  . If you are admitted to the hospital after surgery: o Your blood sugar will be checked by the staff and you will probably be given insulin after surgery (instead of oral diabetes medicines) to make sure you have good blood sugar levels. o The goal for blood sugar control after surgery is 80-180 mg/dL.  Do NOT smoke 24 hours prior to surgery.   Do not wear jewelry.  Do not wear lotions, powders, cologne or deodorant.  Men may shave face and neck.  Do not bring valuables to the hospital.  [facility] is not responsible for any belongings or valuables.  Contacts, dentures or bridgework may not be worn into surgery.  Leave your suitcase in the car.  After surgery it may be brought to your room.  For patients admitted to the hospital, discharge time will be determined by your treatment team.  Patients discharged the day of surgery will not be allowed to drive home.   [place] - Preparing for Surgery  Before surgery, you can play an  important role.  Because skin is not sterile, your skin needs to be as free of germs as possible.  You can reduce the number of germs on you skin by washing with CHG (chlorahexidine gluconate) soap before surgery.  CHG is an antiseptic cleaner which kills germs and bonds with the skin to continue killing germs even after washing.  Oral Hygiene is also important in reducing the risk of infection.  Remember to brush your teeth with your regular toothpaste the morning of surgery.  Please DO NOT use if you have an allergy to CHG or antibacterial soaps.  If your skin becomes  reddened/irritated stop using the CHG and inform your nurse when you arrive at Short Stay.  Do not shave (including legs and underarms) for at least 48 hours prior to the first CHG shower.  You may shave your face.  Please follow these instructions carefully:   1.  Shower with CHG Soap the night before surgery and the morning of Surgery.  2.  If you choose to wash your hair, wash your hair first as usual with your normal shampoo.  3.  After you shampoo, rinse your hair and body thoroughly to remove the shampoo. 4.  Use CHG as you would any other liquid soap.  You can apply chg directly to the skin and wash gently with a      scrungie or washcloth.           5.  Apply the CHG Soap to your body ONLY FROM THE NECK DOWN.   Do not use on open wounds or open sores. Avoid contact with your eyes, ears, mouth and genitals (private parts).  Wash genitals (private parts) with your normal soap - do this prior to using CHG soap.  6.  Wash thoroughly, paying special attention to the area where your surgery will be performed.  7.  Thoroughly rinse your body with warm water from the neck down.  8.  DO NOT shower/wash with your normal soap after using and rinsing off the CHG Soap.  9.  Pat yourself dry with a clean towel.            10.  Wear clean pajamas.            11.  Place clean sheets on your bed the night of your first shower and do not sleep with pets.  Day of Surgery  Shower as above. Do not apply any lotions/deodorants the morning of surgery.   Please wear clean clothes to the hospital. Remember to brush your teeth with toothpaste.   Please read over the fact sheets that you were given.

## 2019-02-24 ENCOUNTER — Encounter (HOSPITAL_COMMUNITY): Admission: RE | Payer: Self-pay | Source: Ambulatory Visit

## 2019-02-24 ENCOUNTER — Ambulatory Visit (HOSPITAL_COMMUNITY): Admission: RE | Admit: 2019-02-24 | Payer: Medicare Other | Source: Ambulatory Visit | Admitting: Otolaryngology

## 2019-02-24 SURGERY — INSERTION, HYPOGLOSSAL NERVE STIMULATOR
Anesthesia: General

## 2019-07-28 ENCOUNTER — Other Ambulatory Visit: Payer: Self-pay

## 2019-07-28 DIAGNOSIS — N521 Erectile dysfunction due to diseases classified elsewhere: Secondary | ICD-10-CM

## 2019-07-28 DIAGNOSIS — E1169 Type 2 diabetes mellitus with other specified complication: Secondary | ICD-10-CM

## 2019-07-28 MED ORDER — AMBULATORY NON FORMULARY MEDICATION: Status: DC

## 2019-07-28 NOTE — Progress Notes (Signed)
Pt called for xiaflex issues. Pt needs picture of penile curvature. Pt unable to get an erection for picture. Order received from Dr. Alyson Ingles for 20 mcg prostaglandin 33ml injection to be brought with pt to appt on 5/3 to document curvature of penis to proceed with xiaflex approval. rx faxed to [facility]. Pt understands to bring medication with him to appt on Monday.

## 2019-08-02 ENCOUNTER — Encounter: Payer: Self-pay | Admitting: Urology

## 2019-08-02 ENCOUNTER — Ambulatory Visit (INDEPENDENT_AMBULATORY_CARE_PROVIDER_SITE_OTHER): Payer: Medicare Other | Admitting: Urology

## 2019-08-02 VITALS — BP 109/73 | HR 84 | Temp 97.7°F | Ht 71.0 in | Wt 160.0 lb

## 2019-08-02 DIAGNOSIS — N486 Induration penis plastica: Secondary | ICD-10-CM | POA: Diagnosis not present

## 2019-08-02 NOTE — Progress Notes (Signed)
Urological Symptom Review  Patient is experiencing the following symptoms: Hard to postpone urination Get up at night to urinate Erection problems (male only)  Kidney stones   Review of Systems  Gastrointestinal (upper)  : Nausea  indigestion/heartburn Gastrointestinal (lower) : Negative for lower GI symptoms  Constitutional : Night Sweats  Skin: Negative for skin symptoms  Eyes: Negative for eye symptoms  Ear/Nose/Throat : Sinus problems  Hematologic/Lymphatic:  Bleed easily  Cardiovascular : Chest pain  Respiratory : Cough  Endocrine: Negative for endocrine symptoms  Musculoskeletal: Back pain  Neurological: Headaches  Psychologic: Depression Anxiety

## 2019-08-02 NOTE — Progress Notes (Signed)
08/02/2019 3:37 PM   Ricky Stabs February 17, 1961 [id number]  Referring provider: Jessee Avers, [place],  [place] [id number]  peyronies disease  HPI: Mr Derek Chen is a 59yo here for followup for peyronies disease. Erection induced in office with 83mg PGE and curvature was noted 45-50 degrees dorsal distal penile shaft curvature. No pain with erection. Curvature has been stable for over 1 year. He has a palpable dorsal distal penile shaft plaque   PMH: Past Medical History:  Diagnosis Date  . Anxiety   . Arthritis   . Basal cell cancer    Chest and back - status post skin grafting  . BPH (benign prostatic hyperplasia)   . Cardiomyopathy (HCC)    LVEF 45-50%  . CHF (congestive heart failure) ([place]   . Complication of anesthesia    Difficult to wake up after anesthesia  . COPD (chronic obstructive pulmonary disease) ([place]   . Coronary artery disease    NSTEMI - DES mid LAD December 2011 (patent December 2013)  . Dyslipidemia   . Essential hypertension, benign   . GERD (gastroesophageal reflux disease)   . Hepatitis B antibody positive 2006  . History of kidney stones   . [place]ocardial infarction) ([place]    3 MI's last in 2016.  .Marland KitchenPONV (postoperative nausea and vomiting)    pt states that zofran and scope patch did not help PONV after last surgery.  . Renal insufficiency   . Sleep apnea   . Type 2 diabetes mellitus ([place] 2012  . Viral pneumonia, unspecified 2004    Surgical History: Past Surgical History:  Procedure Laterality Date  . APPENDECTOMY    . COLONOSCOPY N/A 08/10/2015   Procedure: COLONOSCOPY;  Surgeon: NRogene Houston MD;  Location: [facility] ENDO SUITE;  Service: Endoscopy;  Laterality: N/A;  730  . CORONARY STENT PLACEMENT    . DRUG INDUCED ENDOSCOPY N/A 11/30/2018   Procedure: DRUG INDUCED SLEEP ENDOSCOPY;  Surgeon: BMelida Quitter MD;  [place]  Service: ENT;  Laterality: N/A;  . FINGER SURGERY Right    index, gnagrene  in finger.  .Marland KitchenKNEE ARTHROSCOPY     Left  . LEFT HEART CATHETERIZATION WITH CORONARY ANGIOGRAM N/A 03/20/2012   Procedure: LEFT HEART CATHETERIZATION WITH CORONARY ANGIOGRAM;  Surgeon: PThayer Headings MD;  [facility];  Service: Cardiovascular;  Laterality: N/A;  . MASS EXCISION N/A 08/23/2016   Procedure: EXCISION BASAL CELL CARCINOMA ON BACK OVER 4 CM;  Surgeon: JAviva Signs MD;  Location: [facility] ORS;  Service: General;  Laterality: N/A;  . PORT-A-CATH REMOVAL    . PORTACATH PLACEMENT    . SKIN GRAFT SPLIT THICKNESS TRUNK    01/23/2010    Right chest;  WFelicie Morn M.D.  . VASECTOMY      Home Medications:  Allergies as of 08/02/2019      Reactions   Bupropion Other (See Comments)   paralyzed   Trazodone Palpitations, Other (See Comments)   paralyzed   Ketorolac Diarrhea   Acetaminophen Other (See Comments)   Advil [ibuprofen] Itching   Dilaudid [hydromorphone] Nausea And Vomiting   Tolerates IV formulation--unable to tolerate pill form   Mobic [meloxicam] Other (See Comments)   Severe headaches.   Naproxen Nausea Only   Nsaids Other (See Comments)   Tramadol Hives, Itching   Vicodin [hydrocodone-acetaminophen] Hives   Cimetidine Other (See Comments), Rash   headaches Severe headache   Gabapentin Anxiety   "  Neck twitches"   Tape Rash   Paper tape      Medication List       Accurate as of Aug 02, 2019  3:37 PM. If you have any questions, ask your nurse or doctor.        Accu-Chek Guide test strip Generic drug: glucose blood 2 (two) times daily. check   Accu-Chek Guide w/Device Kit See admin instructions.   Accu-Chek Softclix Lancets lancets 2 (two) times daily.   Acetaminophen 325 MG Caps   albuterol 108 (90 Base) MCG/ACT inhaler Commonly known as: VENTOLIN HFA Inhale 1-2 puffs into the lungs daily as needed (for shortness of breath/wheezing).   AMBULATORY NON FORMULARY MEDICATION Prostaglandin 20 mcq.  To bring medication to office for  penile injection   Anoro Ellipta 62.5-25 MCG/INH Aepb Generic drug: umeclidinium-vilanterol   aspirin 81 MG EC tablet Take 1 tablet (81 mg total) by mouth daily.   budesonide 0.5 MG/2ML nebulizer solution Commonly known as: PULMICORT   butalbital-acetaminophen-caffeine 50-325-40 MG tablet Commonly known as: FIORICET Take 1-2 tablets by mouth every 4 (four) hours as needed.   chlorpheniramine-HYDROcodone 10-8 MG/5ML Suer Commonly known as: TUSSIONEX SMARTSIG:1 Teaspoon By Mouth Every 12 Hours   Cinnamon 500 MG Tabs Take 1,000 mg by mouth daily.   clonazePAM 1 MG tablet Commonly known as: KLONOPIN Take 2 mg by mouth 2 (two) times daily.   clopidogrel 75 MG tablet Commonly known as: PLAVIX Take 75 mg by mouth daily.   DULoxetine 60 MG capsule Commonly known as: Cymbalta Take 1 capsule (60 mg total) by mouth 2 (two) times daily. What changed:   how much to take  when to take this   Flovent Diskus 50 MCG/BLIST diskus inhaler Generic drug: fluticasone   fluticasone 50 MCG/ACT nasal spray Commonly known as: FLONASE Place 2 sprays into both nostrils daily as needed for allergies or rhinitis.   glimepiride 4 MG tablet Commonly known as: AMARYL Take 1 tablet (4 mg total) by mouth daily with breakfast. For diabetes   hydrOXYzine 50 MG tablet Commonly known as: ATARAX/VISTARIL Take by mouth.   Januvia 100 MG tablet Generic drug: sitaGLIPtin Take 100 mg by mouth daily.   lidocaine 5 % ointment Commonly known as: XYLOCAINE   lisinopril 2.5 MG tablet Commonly known as: ZESTRIL Take 1 tablet (2.5 mg total) by mouth daily.   meclizine 25 MG tablet Commonly known as: ANTIVERT   metFORMIN 1000 MG tablet Commonly known as: GLUCOPHAGE Take 1 tablet (1,000 mg total) by mouth 2 (two) times daily with a meal.   metoprolol succinate 50 MG 24 hr tablet Commonly known as: TOPROL-XL Take 50 mg by mouth daily.   nitroGLYCERIN 0.4 MG SL tablet Commonly known as:  NITROSTAT Place 1 tablet (0.4 mg total) under the tongue every 5 (five) minutes as needed for chest pain.   oxyCODONE-acetaminophen 10-325 MG tablet Commonly known as: PERCOCET Take 1 tablet by mouth 4 (four) times daily as needed.   pantoprazole 40 MG tablet Commonly known as: Protonix Take 1 tablet (40 mg total) by mouth daily.   promethazine 25 MG tablet Commonly known as: PHENERGAN Take 1 tablet (25 mg total) by mouth daily. for Nausea and Vomiting. What changed:   how much to take  when to take this  additional instructions   rosuvastatin 10 MG tablet Commonly known as: CRESTOR Take 1 tablet (10 mg total) by mouth daily.   senna-docusate 8.6-50 MG tablet Commonly known as: Senokot-S   sildenafil 50  MG tablet Commonly known as: VIAGRA   tamsulosin 0.4 MG Caps capsule Commonly known as: FLOMAX Take 1 capsule (0.4 mg total) by mouth daily. What changed: how much to take   Xtampza ER 13.5 MG C12a Generic drug: oxyCODONE ER TAKE 1 CAPSULE AND SPRINKLE ENTIRE CONTENTS ON SOFT FOODS OR INTO A CUP THEN IMMEDIATELY TAKE BY MOUTH EVERY TWELVE HOURS   zolpidem 10 MG tablet Commonly known as: AMBIEN Take 15 mg by mouth at bedtime.       Allergies:  Allergies  Allergen Reactions  . Bupropion Other (See Comments)    paralyzed  . Trazodone Palpitations and Other (See Comments)    paralyzed  . Ketorolac Diarrhea  . Acetaminophen Other (See Comments)  . Advil [Ibuprofen] Itching  . Dilaudid [Hydromorphone] Nausea And Vomiting    Tolerates IV formulation--unable to tolerate pill form  . Mobic [Meloxicam] Other (See Comments)    Severe headaches.  . Naproxen Nausea Only  . Nsaids Other (See Comments)  . Tramadol Hives and Itching  . Vicodin [Hydrocodone-Acetaminophen] Hives  . Cimetidine Other (See Comments) and Rash    headaches Severe headache  . Gabapentin Anxiety    "Neck twitches"  . Tape Rash    Paper tape    Family History: Family History  Problem  Relation Age of Onset  . Diabetes Mother   . Deep vein thrombosis Mother   . Pulmonary embolism Mother   . Cancer Father        hodgins lymphoma  . Diabetes Sister     Social History:  reports that he has been smoking cigarettes. He has a 17.50 pack-year smoking history. He quit smokeless tobacco use about 12 years ago.  His smokeless tobacco use included chew. He reports current alcohol use of about 2.0 - 3.0 standard drinks of alcohol per week. He reports that he does not use drugs.  ROS: All other review of systems were reviewed and are negative except what is noted above in HPI  Physical Exam: BP 109/73   Pulse 84   Temp 97.7 F (36.5 C)   Ht '5\' 11"'  (1.803 m)   Wt 160 lb (72.6 kg)   BMI 22.32 kg/m   Constitutional:  Alert and oriented, No acute distress. HEENT: [place] AT, moist mucus membranes.  Trachea midline, no masses. Cardiovascular: No clubbing, cyanosis, or edema. Respiratory: Normal respiratory effort, no increased work of breathing. GI: Abdomen is soft, nontender, nondistended, no abdominal masses GU: No CVA tenderness. Circumcised phallus. No masses/lesions on penis, testis, scrotum. Prostate 40g smooth no nodules no induration.  Lymph: No cervical or inguinal lymphadenopathy. Skin: No rashes, bruises or suspicious lesions. Neurologic: Grossly intact, no focal deficits, moving all 4 extremities. Psychiatric: Normal mood and affect.  Laboratory Data: Lab Results  Component Value Date   WBC 10.7 (H) 02/22/2019   HGB 16.4 02/22/2019   HCT 49.3 02/22/2019   MCV 90.6 02/22/2019   PLT 221 02/22/2019    Lab Results  Component Value Date   CREATININE 0.95 02/22/2019    Lab Results  Component Value Date   PSA CANCELED 11/06/2012   PSA 0.6 11/06/2012    Lab Results  Component Value Date   TESTOSTERONE 212 (L) 08/09/2013    Lab Results  Component Value Date   HGBA1C 10.2 (H) 02/22/2019    Urinalysis    Component Value Date/Time   COLORURINE YELLOW  03/18/2012 0402   APPEARANCEUR CLEAR 03/18/2012 0402   LABSPEC >1.030 (H) 03/18/2012 0402  PHURINE 5.5 03/18/2012 0402   GLUCOSEU NEGATIVE 03/18/2012 0402   HGBUR NEGATIVE 03/18/2012 0402   BILIRUBINUR NEGATIVE 03/18/2012 0402   KETONESUR NEGATIVE 03/18/2012 0402   PROTEINUR NEGATIVE 03/18/2012 0402   UROBILINOGEN 0.2 03/18/2012 0402   NITRITE NEGATIVE 03/18/2012 0402   LEUKOCYTESUR NEGATIVE 03/18/2012 0402    Lab Results  Component Value Date   LABMICR 8.9 11/09/2012    Pertinent Imaging:  Results for orders placed in visit on 11/30/13  DG Abd 1 View   Narrative CLINICAL DATA:  Left flank pain  EXAM: ABDOMEN - 1 VIEW  COMPARISON:  None.  FINDINGS: The bowel gas pattern is normal. No radio-opaque calculi or other significant radiographic abnormality are seen.  IMPRESSION: No acute abnormality noted.   Electronically Signed   By: Inez Catalina M.D.   On: 11/30/2013 10:37    No results found for this or any previous visit. No results found for this or any previous visit. No results found for this or any previous visit. No results found for this or any previous visit. No results found for this or any previous visit. No results found for this or any previous visit. No results found for this or any previous visit.  Assessment & Plan:    1. Peyronie's disease -PGE 0.17m injected in office -we discussed the management including medical therapy, verapmil injection, xiaflex therapy, penile plication. After discussing the options the patient elects for xiaflex therapy. Risks/benefits/alterantives discussed    No follow-ups on file.  PNicolette Bang MD  [facility] [place]

## 2019-08-11 ENCOUNTER — Other Ambulatory Visit: Payer: Self-pay

## 2019-08-11 DIAGNOSIS — E1169 Type 2 diabetes mellitus with other specified complication: Secondary | ICD-10-CM

## 2019-08-11 MED ORDER — AMBULATORY NON FORMULARY MEDICATION: 3 refills | Status: DC

## 2019-08-16 ENCOUNTER — Encounter: Payer: Self-pay | Admitting: Urology

## 2019-08-16 ENCOUNTER — Ambulatory Visit (INDEPENDENT_AMBULATORY_CARE_PROVIDER_SITE_OTHER): Payer: Medicare Other | Admitting: Urology

## 2019-08-16 ENCOUNTER — Other Ambulatory Visit: Payer: Self-pay

## 2019-08-16 VITALS — BP 131/77 | HR 88 | Temp 98.1°F | Ht 71.0 in | Wt 159.0 lb

## 2019-08-16 DIAGNOSIS — N486 Induration penis plastica: Secondary | ICD-10-CM

## 2019-08-16 MED ORDER — COLLAGENASE CLOSTRID HISTOLYT 0.9 MG IJ SOLR
0.9000 mg | Freq: Once | INTRAMUSCULAR | Status: AC
  Administered 2019-08-16: 0.9 mg via INTRAMUSCULAR

## 2019-08-16 NOTE — Progress Notes (Signed)
Xiaflex injection:  Procedure:  Concentration and amount of PGE-1 injected: 1cc of 10g/CC  Approximate size and location of plaque:   Degree of curvature: 45   Neo-Synephrine 150 g brought brisk detumescence.  Injection procedure:  Using sterile technique an alcohol swab was used to clean the area of injection. The location had been previously marked. A 27-gauge needle was inserted through the skin and into the plaque at the point of maximum concavity with the needle oriented perpendicular to the corpus cavernosum. The needle was advanced transversely through the width of the plaque toward the opposite side of the plaque without passing completely through it. Proper position was confirmed by noting resistance to minimal depression of the syringe plunger. I then began injecting maintaining steady pressure as I withdrew the needle slowly through the transverse width of the plaque depositing the 80mg  of Xiaflex solution 10mg  wastage. The needle was then withdrawn and gentle pressure was applied at the injection site.    He tolerated his injection well. I will therefore have him return in 48 hours for repeat injection.

## 2019-08-16 NOTE — Progress Notes (Signed)
Urological Symptom Review  Patient is experiencing the following symptoms: Get up at night to urinate Trouble starting stream Erection problems (male only)   Review of Systems  Gastrointestinal (upper)  : Negative for upper GI symptoms  Gastrointestinal (lower) : Negative for lower GI symptoms  Constitutional : Night Sweats  Skin: Negative for skin symptoms  Eyes: Negative for eye symptoms  Ear/Nose/Throat : Sinus problems  Hematologic/Lymphatic: Negative for Hematologic/Lymphatic symptoms  Cardiovascular : Negative for cardiovascular symptoms  Respiratory : Negative for respiratory symptoms  Endocrine: Negative for endocrine symptoms  Musculoskeletal: Negative for musculoskeletal symptoms  Neurological: Negative for neurological symptoms  Psychologic: Negative for psychiatric symptoms

## 2019-08-18 ENCOUNTER — Encounter: Payer: Self-pay | Admitting: Urology

## 2019-08-18 ENCOUNTER — Ambulatory Visit (INDEPENDENT_AMBULATORY_CARE_PROVIDER_SITE_OTHER): Payer: Medicare Other | Admitting: Urology

## 2019-08-18 ENCOUNTER — Other Ambulatory Visit: Payer: Self-pay

## 2019-08-18 ENCOUNTER — Encounter (INDEPENDENT_AMBULATORY_CARE_PROVIDER_SITE_OTHER): Payer: Self-pay | Admitting: *Deleted

## 2019-08-18 VITALS — BP 144/80 | HR 88 | Temp 97.8°F | Ht 71.0 in | Wt 158.0 lb

## 2019-08-18 DIAGNOSIS — N486 Induration penis plastica: Secondary | ICD-10-CM | POA: Diagnosis not present

## 2019-08-18 MED ORDER — COLLAGENASE CLOSTRID HISTOLYT 0.9 MG IJ SOLR
0.9000 mg | Freq: Once | INTRAMUSCULAR | Status: AC
  Administered 2019-08-18: 0.9 mg via INTRAMUSCULAR

## 2019-08-18 NOTE — Progress Notes (Signed)

## 2019-08-18 NOTE — Progress Notes (Signed)
Xiaflex second injection:  Injection procedure:  Using sterile technique an alcohol swab was used to clean the area of injection. The location had been previously marked. A 27-gauge needle was inserted through the skin and into the plaque at the point of maximum concavity with the needle oriented perpendicular to the corpus cavernosum. The needle was advanced transversely through the width of the plaque toward the opposite side of the plaque without passing completely through it. Proper position was confirmed by noting resistance to minimal depression of the syringe plunger. I then began injecting maintaining steady pressure as I withdrew the needle slowly through the transverse width of the plaque depositing  0.25 cc  of Xiaflex solution 0cc wastage. The needle was then withdrawn and gentle pressure was applied at the injection site.   He tolerated his injection well. I will therefore have him return in 48 hours for penile modeling.  

## 2019-08-19 ENCOUNTER — Telehealth: Payer: Self-pay | Admitting: Urology

## 2019-08-19 NOTE — Telephone Encounter (Signed)
Pt requests a nurse return his call. 

## 2019-08-19 NOTE — Telephone Encounter (Signed)
I spoke with pt.telling him I got message from Dr. Alyson Ingles saying all these symptoms are normal. Dr. Alyson Ingles said he told pt it would bruise. Pt expressed understanding. Told pt any further problems he could call back or that Dr. Alyson Ingles would be in office tomorrow if he needed to be seen.

## 2019-08-23 ENCOUNTER — Other Ambulatory Visit: Payer: Self-pay

## 2019-08-23 ENCOUNTER — Ambulatory Visit: Payer: Medicare Other | Admitting: Urology

## 2019-08-23 ENCOUNTER — Ambulatory Visit (INDEPENDENT_AMBULATORY_CARE_PROVIDER_SITE_OTHER): Payer: Medicare Other | Admitting: Urology

## 2019-08-23 VITALS — BP 108/67 | HR 111 | Temp 97.9°F | Ht 71.0 in | Wt 158.0 lb

## 2019-08-23 DIAGNOSIS — N486 Induration penis plastica: Secondary | ICD-10-CM

## 2019-08-23 NOTE — Progress Notes (Signed)

## 2019-09-08 ENCOUNTER — Ambulatory Visit (INDEPENDENT_AMBULATORY_CARE_PROVIDER_SITE_OTHER): Payer: Medicare Other | Admitting: Gastroenterology

## 2019-09-08 ENCOUNTER — Encounter (INDEPENDENT_AMBULATORY_CARE_PROVIDER_SITE_OTHER): Payer: Self-pay | Admitting: Gastroenterology

## 2019-09-08 ENCOUNTER — Other Ambulatory Visit: Payer: Self-pay

## 2019-09-08 ENCOUNTER — Encounter (INDEPENDENT_AMBULATORY_CARE_PROVIDER_SITE_OTHER): Payer: Self-pay | Admitting: *Deleted

## 2019-09-08 VITALS — BP 138/78 | HR 84 | Temp 97.0°F | Ht 71.0 in | Wt 160.2 lb

## 2019-09-08 DIAGNOSIS — R634 Abnormal weight loss: Secondary | ICD-10-CM | POA: Diagnosis not present

## 2019-09-08 DIAGNOSIS — R110 Nausea: Secondary | ICD-10-CM

## 2019-09-08 DIAGNOSIS — Z860100 Personal history of colon polyps, unspecified: Secondary | ICD-10-CM

## 2019-09-08 DIAGNOSIS — R197 Diarrhea, unspecified: Secondary | ICD-10-CM | POA: Diagnosis not present

## 2019-09-08 DIAGNOSIS — K219 Gastro-esophageal reflux disease without esophagitis: Secondary | ICD-10-CM

## 2019-09-08 DIAGNOSIS — R1310 Dysphagia, unspecified: Secondary | ICD-10-CM

## 2019-09-08 MED ORDER — PANTOPRAZOLE SODIUM 40 MG PO TBEC: 40.0000 mg | DELAYED_RELEASE_TABLET | Freq: Two times a day (BID) | ORAL | Status: DC

## 2019-09-08 MED ORDER — PROMETHAZINE HCL 25 MG PO TABS: 25.0000 mg | ORAL_TABLET | Freq: Two times a day (BID) | ORAL | 3 refills | Status: DC | PRN

## 2019-09-08 NOTE — Progress Notes (Signed)
Patient profile: Derek Chen is a 59 y.o. male seen for evaluation of diarrhea, wt loss, nausea, GERD . Last seen in clinic 2016.  History of Present Illness: Derek Chen is seen today and reports his baseline bowel habits were 1-2 bowel movements a day that were formed, over the past 2 weeks he has developed some loose mushy stools with up to 4 bowel movements a day.  He has had a few episodes of nocturnal diarrhea as well over past 2 weeks.  He has not seen any blood in the stools.  He denies any medication changes at onset of diarrhea except his Phenergan being decreased from 50 mg a day to 25 mg a day.  He denies any known infectious contacts prior to onset of diarrhea.  Minimal lower abdominal pain with the symptoms.  He reports being on Protonix many years with typically good control but more recently having breakthrough heartburn symptoms. Currently on Protonix 40 mg once a day.  He reports the past 2 months he has become full after a few bites very easily.  He has occasional dysphagia to meats and breads.  He does have a queasy sensation frequently but is not vomiting frequently.  He reports approximately 20 pound weight loss-on chart review his weight in November 2020 was $174 and was #160 today.  Wt Readings from Last 3 Encounters:  09/08/19 160 lb 3.2 oz (72.7 kg)  08/23/19 158 lb (71.7 kg)  08/18/19 158 lb (71.7 kg)     Last Colonoscopy:07/2015- 9 mm polyp transverse, 2 polyps sigmoid, 8 mm polyp rectum. Path - All 4 polyps are tubular adenomas. Next colonoscopy in 3 years.      Past Medical History:  Past Medical History:  Diagnosis Date  . Anxiety   . Arthritis   . Basal cell cancer    Chest and back - status post skin grafting  . BPH (benign prostatic hyperplasia)   . Cardiomyopathy (HCC)    LVEF 45-50%  . CHF (congestive heart failure) ([place])   . Complication of anesthesia    Difficult to wake up after anesthesia  . COPD (chronic obstructive pulmonary  disease) ([place])   . Coronary artery disease    NSTEMI - DES mid LAD December 2011 (patent December 2013)  . Dyslipidemia   . Essential hypertension, benign   . GERD (gastroesophageal reflux disease)   . Hepatitis B antibody positive 2006  . History of kidney stones   . MI (myocardial infarction) ([place])    3 MI's last in 2016.  Marland Kitchen PONV (postoperative nausea and vomiting)    pt states that zofran and scope patch did not help PONV after last surgery.  . Renal insufficiency   . Sleep apnea   . Type 2 diabetes mellitus ([place]) 2012  . Viral pneumonia, unspecified 2004    Problem List: Patient Active Problem List   Diagnosis Date Noted  . Peyronie's disease 08/02/2019  . MDD (major depressive disorder), recurrent, severe, with psychosis ([place]) 04/18/2017  . Suicidal ideations 04/16/2017  . Atypical chest pain 11/15/2015  . Hypokalemia 09/15/2015  . Chest pain 09/15/2015  . Insomnia 09/15/2015  . Depression 09/15/2015  . 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 Non-insulin dependent type 2 diabetes mellitus ([place])   . Erectile dysfunction associated with type 2 diabetes mellitus ([place]) 01/21/2014  . COPD, moderate ([place]) 07/15/2013  . Diabetes ([place]) 11/06/2012  . BPH (benign prostatic hyperplasia) 11/06/2012  . Secondary  cardiomyopathy, unspecified 09/27/2012  . Palpitations 03/29/2012  . Dyspnea and respiratory abnormality 03/29/2012  . Breath shortness 03/29/2012  . Acute myocardial infarction, subendocardial infarction ([place]) 03/27/2012  . Heart disease 03/27/2012  . 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 Coronary atherosclerosis 03/27/2012  . Acute non-ST segment elevation myocardial infarction ([place]) 03/27/2012  . Left ventricular dysfunction 03/27/2012  . Anxiety 03/27/2012  . Essential hypertension, benign   . Basal cell carcinoma   . Coronary atherosclerosis of native coronary artery   . Hyperlipidemia with target LDL less than 100  04/19/2010    Past Surgical History: Past Surgical History:  Procedure Laterality Date  . APPENDECTOMY    . COLONOSCOPY N/A 08/10/2015   Procedure: COLONOSCOPY;  Surgeon: Rogene Houston, MD;  Location: [facility];  Service: Endoscopy;  Laterality: N/A;  730  . CORONARY STENT PLACEMENT    . DRUG INDUCED ENDOSCOPY N/A 11/30/2018   Procedure: DRUG INDUCED SLEEP ENDOSCOPY;  Surgeon: Melida Quitter, MD;  Location: [place];  Service: ENT;  Laterality: N/A;  . FINGER SURGERY Right    index, gnagrene in finger.  Marland Kitchen KNEE ARTHROSCOPY     Left  . LEFT HEART CATHETERIZATION WITH CORONARY ANGIOGRAM N/A 03/20/2012   Procedure: LEFT HEART CATHETERIZATION WITH CORONARY ANGIOGRAM;  Surgeon: Thayer Headings, MD;  Location: [facility] CATH LAB;  Service: Cardiovascular;  Laterality: N/A;  . MASS EXCISION N/A 08/23/2016   Procedure: EXCISION BASAL CELL CARCINOMA ON BACK OVER 4 CM;  Surgeon: Aviva Signs, MD;  Location: [facility] ORS;  Service: General;  Laterality: N/A;  . PORT-A-CATH REMOVAL    . PORTACATH PLACEMENT    . SKIN GRAFT SPLIT THICKNESS TRUNK    01/23/2010    Right chest;  Felicie Morn, M.D.  . VASECTOMY      Allergies: Allergies  Allergen Reactions  . Bupropion Other (See Comments)    paralyzed  . Trazodone Palpitations and Other (See Comments)    paralyzed  . Ketorolac Diarrhea  . Acetaminophen Other (See Comments)  . Advil [Ibuprofen] Itching  . Dilaudid [Hydromorphone] Nausea And Vomiting    Tolerates IV formulation--unable to tolerate pill form  . Mobic [Meloxicam] Other (See Comments)    Severe headaches.  . Naproxen Nausea Only  . Nsaids Other (See Comments)  . Tramadol Hives and Itching  . Vicodin [Hydrocodone-Acetaminophen] Hives  . Cimetidine Other (See Comments) and Rash    headaches Severe headache  . Gabapentin Anxiety    "Neck twitches"  . Tape Rash    Paper tape      Home Medications:  Current Outpatient Medications:  .  ACCU-CHEK GUIDE test  strip, 2 (two) times daily. check, Disp: , Rfl:  .  Accu-Chek Softclix Lancets lancets, 2 (two) times daily., Disp: , Rfl:  .  albuterol (PROVENTIL HFA;VENTOLIN HFA) 108 (90 Base) MCG/ACT inhaler, Inhale 1-2 puffs into the lungs daily as needed (for shortness of breath/wheezing)., Disp: 1 Inhaler, Rfl: 0 .  AMBULATORY NON FORMULARY MEDICATION, Prostaglandin 10 mcq/ml  To bring medication to office for penile injection, Disp: 1 mL, Rfl: 3 .  aspirin EC 81 MG EC tablet, Take 1 tablet (81 mg total) by mouth daily., Disp: , Rfl:  .  Blood Glucose Monitoring Suppl (ACCU-CHEK GUIDE) w/Device KIT, See admin instructions., Disp: , Rfl:  .  budesonide (PULMICORT) 0.5 MG/2ML nebulizer solution, , Disp: , Rfl:  .  butalbital-acetaminophen-caffeine (FIORICET) 50-325-40 MG tablet, Take 1-2 tablets by mouth every 4 (four) hours as needed., Disp: ,  Rfl:  .  chlorpheniramine-HYDROcodone (TUSSIONEX) 10-8 MG/5ML SUER, SMARTSIG:1 Teaspoon By Mouth Every 12 Hours, Disp: , Rfl:  .  Cinnamon 500 MG TABS, Take 1,000 mg by mouth daily., Disp: , Rfl:  .  clonazePAM (KLONOPIN) 1 MG tablet, Take 2 mg by mouth 2 (two) times daily., Disp: , Rfl:  .  clopidogrel (PLAVIX) 75 MG tablet, Take 75 mg by mouth daily., Disp: , Rfl:  .  DULoxetine (CYMBALTA) 60 MG capsule, Take 1 capsule (60 mg total) by mouth 2 (two) times daily. (Patient taking differently: Take 120 mg by mouth daily. ), Disp: 60 capsule, Rfl: 0 .  fluticasone (FLONASE) 50 MCG/ACT nasal spray, Place 2 sprays into both nostrils daily as needed for allergies or rhinitis. , Disp: , Rfl:  .  fluticasone (FLOVENT DISKUS) 50 MCG/BLIST diskus inhaler, Inhale 2 puffs into the lungs daily. , Disp: , Rfl:  .  glimepiride (AMARYL) 4 MG tablet, Take 1 tablet (4 mg total) by mouth daily with breakfast. For diabetes, Disp: 30 tablet, Rfl: 0 .  hydrOXYzine (ATARAX/VISTARIL) 50 MG tablet, Take 50 mg by mouth daily. , Disp: , Rfl:  .  lidocaine (XYLOCAINE) 5 % ointment, Apply 1  application topically as needed. , Disp: , Rfl:  .  lisinopril (PRINIVIL,ZESTRIL) 2.5 MG tablet, Take 1 tablet (2.5 mg total) by mouth daily., Disp: 30 tablet, Rfl: 0 .  meclizine (ANTIVERT) 25 MG tablet, Take 25 mg by mouth as needed. , Disp: , Rfl:  .  metoprolol succinate (TOPROL-XL) 50 MG 24 hr tablet, Take 50 mg by mouth daily., Disp: , Rfl:  .  nitroGLYCERIN (NITROSTAT) 0.4 MG SL tablet, Place 1 tablet (0.4 mg total) under the tongue every 5 (five) minutes as needed for chest pain., Disp: 5 tablet, Rfl: 1 .  oxyCODONE-acetaminophen (PERCOCET) 10-325 MG tablet, Take 1 tablet by mouth 4 (four) times daily as needed., Disp: , Rfl:  .  pantoprazole (PROTONIX) 40 MG tablet, Take 1 tablet (40 mg total) by mouth 2 (two) times daily before a meal., Disp: 180 tablet, Rfl: 01 .  promethazine (PHENERGAN) 25 MG tablet, Take 1 tablet (25 mg total) by mouth 2 (two) times daily as needed for nausea or vomiting. for Nausea and Vomiting., Disp: 60 tablet, Rfl: 3 .  rosuvastatin (CRESTOR) 10 MG tablet, Take 1 tablet (10 mg total) by mouth daily., Disp: 30 tablet, Rfl: 0 .  sildenafil (VIAGRA) 50 MG tablet, 50 mg as needed. , Disp: , Rfl:  .  tamsulosin (FLOMAX) 0.4 MG CAPS capsule, Take 1 capsule (0.4 mg total) by mouth daily. (Patient taking differently: Take 0.8 mg by mouth daily. ), Disp: 30 capsule, Rfl: 0 .  umeclidinium-vilanterol (ANORO ELLIPTA) 62.5-25 MCG/INH AEPB, Inhale 1 puff into the lungs as needed. , Disp: , Rfl:  .  zolpidem (AMBIEN) 10 MG tablet, Take 15 mg by mouth at bedtime., Disp: , Rfl:  .  Acetaminophen 325 MG CAPS, , Disp: , Rfl:   -Also on trulicity once a week per patient    Family History: family history includes Cancer in his father; Deep vein thrombosis in his mother; Diabetes in his mother and sister; Pulmonary embolism in his mother.    Social History:   reports that he has been smoking cigarettes. He has a 17.50 pack-year smoking history. He quit smokeless tobacco use about  13 years ago.  His smokeless tobacco use included chew. He reports current alcohol use of about 2.0 - 3.0 standard drinks of alcohol per  week. He reports that he does not use drugs.   Review of Systems: Constitutional: Denies weight loss/weight gain  Eyes: No changes in vision. ENT: No oral lesions, sore throat.  GI: see HPI.  Heme/Lymph: No easy bruising.  CV: No chest pain.  GU: No hematuria.  Integumentary: No rashes.  Neuro: No headaches.  Psych: No depression/anxiety.  Endocrine: No heat/cold intolerance.  Allergic/Immunologic: No urticaria.  Resp: No cough, SOB.  Musculoskeletal: No joint swelling.    Physical Examination: BP 138/78 (BP Location: Right Arm, Patient Position: Sitting, Cuff Size: Large)   Pulse 84   Temp (!) 97 F (36.1 C) (Temporal)   Ht '5\' 11"'  (1.803 m)   Wt 160 lb 3.2 oz (72.7 kg)   BMI 22.34 kg/m  Gen: NAD, alert and oriented x 4 HEENT: PEERLA, EOMI, Neck: supple, no JVD Chest: CTA bilaterally, no wheezes, crackles, or other adventitious sounds CV: RRR, no m/g/c/r Abd: soft, NT, ND, +BS in all four quadrants; no HSM, guarding, ridigity, or r received note from pharmacy that lansoprazole no lower cover, will substitute for omeprazole per pharmacy's recommendation ebound tenderness Ext: no edema, well perfused with 2+ pulses, Skin: no rash or lesions noted on observed skin Lymph: no noted LAD  Data Reviewed:  We will request recent labs from his PCP   Assessment/Plan: Mr. Hauss is a 59 y.o. male    1.  Diarrhea-x2 weeks, concern for infectious etiology and will check stool studies today.  He had labs at his PCP at onset and will request results.  He is overdue for colonoscopy for history of colon polyps and recommend biopsy for IBD markers, colitis etc. at that time if his stool studies return negative.  2.  Weight loss-of concern, unintentionally down at least 14 pounds in the past 6 months per chart review.  Endorses early satiety and chronic  nausea which she reports has been well controlled until antiemetic dose decrease, see below. EGD/colonoscopy scheduled.  3.  GERD/nausea-reports refractory symptoms, will try increasing his PPI to twice a day.  He is also on Phenergan typically 25 mg twice daily, this was recently used once a day and he feels his nausea is worse.  Reviewed recommend endoscopy to evaluate etiology of nausea but will refill Phenergan short-term until EGD.  He denies any improvement in the past with Zofran  Patient denies CP, SOB, and use of blood thinners. I discussed the risks and benefits of procedure including bleeding, perforation, infection, missed lesions, medication reactions and possible hospitalization or surgery if complications. All questions answered.  He is on Plavix.  Reports in the past been difficult to wake up after sedation  +CPAP USE, daily narcotic therapy-recommend propofol   Arwin was seen today for new patient (initial visit).  Diagnoses and all orders for this visit:  Diarrhea, unspecified type -     Gastrointestinal Pathogen Panel PCR -     C. difficile GDH and Toxin A/B  Other orders -     pantoprazole (PROTONIX) 40 MG tablet; Take 1 tablet (40 mg total) by mouth 2 (two) times daily before a meal. -     promethazine (PHENERGAN) 25 MG tablet; Take 1 tablet (25 mg total) by mouth 2 (two) times daily as needed for nausea or vomiting. for Nausea and Vomiting.       I personally performed the service, non-incident to. (WP)  Laurine Blazer, [facility]

## 2019-09-09 ENCOUNTER — Telehealth (INDEPENDENT_AMBULATORY_CARE_PROVIDER_SITE_OTHER): Payer: Self-pay | Admitting: *Deleted

## 2019-09-09 ENCOUNTER — Encounter (INDEPENDENT_AMBULATORY_CARE_PROVIDER_SITE_OTHER): Payer: Self-pay | Admitting: *Deleted

## 2019-09-09 NOTE — Telephone Encounter (Signed)
Primary Cardiologist:No primary care provider on file.  Chart reviewed as part of pre-operative protocol coverage. Because of Derek Chen past medical history and time since last visit, he/she will require a follow-up visit in order to better assess preoperative cardiovascular risk.  Pre-op covering staff: - Please schedule appointment and call patient to inform them. - Please contact requesting surgeon's office via preferred method (i.e, phone, fax) to inform them of need for appointment prior to surgery.  If applicable, this message will also be routed to pharmacy pool and/or primary cardiologist for input on holding anticoagulant/antiplatelet agent as requested below so that this information is available at time of patient's appointment.   Deberah Pelton, NP  09/09/2019, 4:28 PM

## 2019-09-09 NOTE — Telephone Encounter (Signed)
   [facility] Pre-operative Risk Assessment    HEARTCARE STAFF: - Please ensure there is not already an duplicate clearance open for this procedure. - Under Visit Info/Reason for Call, type in Other and utilize the format Clearance MM/DD/YY or Clearance TBD. Do not use dashes or single digits. - If request is for dental extraction, please clarify the # of teeth to be extracted.  Request for surgical clearance:  1. What type of surgery is being performed? Colonoscopy    2. When is this surgery scheduled? 10-01-2019   3. What type of clearance is required (medical clearance vs. Pharmacy clearance to hold med vs. Both)? pharmacy  4. Are there any medications that need to be held prior to surgery and how long?stop Plavix 5 days and ASA 2 days    Practice name and name of physician performing surgery? [facility]  Dr Laural Golden  ATTN: ANN   5. What is the office phone number? [phone]   7.   What is the office fax number? [phone]  8.   Anesthesia type (None, local, MAC, general) ? propofol

## 2019-09-09 NOTE — Telephone Encounter (Signed)
Patient is scheduled for colonoscopy 10/01/19 - we need him to stop Plavix 5 days and ASA 2 days piror - please advise if ok to stop

## 2019-09-10 ENCOUNTER — Telehealth: Payer: Self-pay | Admitting: Urology

## 2019-09-10 ENCOUNTER — Other Ambulatory Visit (INDEPENDENT_AMBULATORY_CARE_PROVIDER_SITE_OTHER): Payer: Self-pay | Admitting: *Deleted

## 2019-09-10 NOTE — Telephone Encounter (Signed)
LM2CB-needs appt for clearance 

## 2019-09-10 NOTE — Telephone Encounter (Signed)
I called pt. And made him aware that you were not here and we would call him the first of next week to schedule his shot.

## 2019-09-10 NOTE — Telephone Encounter (Signed)
Pt called and asked when he would be able to schedule his xiaflex injection #2

## 2019-09-13 NOTE — Telephone Encounter (Signed)
Pt has pre-op clearance appt scheduled 09/17/2019 10:30 AM Levell July, NP

## 2019-09-13 NOTE — Telephone Encounter (Signed)
Pt returned call and appt dates/times given to pt.

## 2019-09-13 NOTE — Telephone Encounter (Signed)
Appts scheduled. Lm to return call

## 2019-09-16 NOTE — Progress Notes (Signed)
Cardiology Office Note  Date: 09/17/2019   ID: Derek Chen, DOB 1960/08/15, MRN [id number]  PCP:  Jessee Avers, FNP  Cardiologist:  Kate Sable, MD Electrophysiologist:  None   Chief Complaint: Preop clearance colonoscopy  History of Present Illness: Derek Chen is a 59 y.o. male with a history of chest pain, coronary artery disease with DES placement to LAD 2011 with patent stent by cath in 2013.  Other history includes COPD, HLD, HTN, GERD, OSA, type II DM, MI (3 MIs, last in 2016).  Echocardiogram 04/18/2017 moderately reduced systolic function with EF of 40%, grade 2 DD with R WMA's consistent with ischemic heart disease.  Last saw Dr. Bronson Ing on 05/02/2017.  He continued to have the left precordial chest pain.  Stated he sometimes took 1 or 2 nitroglycerin tablets to alleviate pain.  Also complaining of bilateral leg cramps.  Status anxiety had not been under control in spite of medications prescribed by psychiatry.  He was requesting Fioricet and clonazepam.  Plavix was discontinued.  Lexiscan Myoview stress test was ordered to evaluate for anterior wall ischemia in the area of his LAD stent.  EF last visit 40%.  Symptomatically stable without signs of heart failure.  He was normotensive.  He was continue his Crestor 10 mg daily.  He was following with psychiatry for depression and previous suicide attempt.  Lexiscan stress test 05/2017 was negative for for blockages per Dr Court Joy note.  He is here today for preop clearance for colonoscopy and EGD.  Dr. Laural Golden will be performing these procedures.  Patient denies any recent acute illnesses or hospitalizations.  States he was recently diagnosed with OSA has not been set up for CPAP yet.  He denies any classic anginal or exertional symptoms, palpitations or arrhythmias, orthostatic symptoms, stroke or TIA-like symptoms, or lower extremity edema.  He does describe some pain in his calves and thighs when walking but  nothing severe and it does not happen every time he walks.  States has been waking up at night with significant sweating.  He does have diabetes and has recently been started on Trulicity which may contribute to some of these side effects.  Advised him to explore this with his primary care provider.   Past Medical History:  Diagnosis Date  . Anxiety   . Arthritis   . Basal cell cancer    Chest and back - status post skin grafting  . BPH (benign prostatic hyperplasia)   . Cardiomyopathy (HCC)    LVEF 45-50%  . CHF (congestive heart failure) ([place])   . Complication of anesthesia    Difficult to wake up after anesthesia  . COPD (chronic obstructive pulmonary disease) ([place])   . Coronary artery disease    NSTEMI - DES mid LAD December 2011 (patent December 2013)  . Dyslipidemia   . Essential hypertension, benign   . GERD (gastroesophageal reflux disease)   . Hepatitis B antibody positive 2006  . History of kidney stones   . MI (myocardial infarction) ([place])    3 MI's last in 2016.  Marland Kitchen PONV (postoperative nausea and vomiting)    pt states that zofran and scope patch did not help PONV after last surgery.  . Renal insufficiency   . Sleep apnea   . Type 2 diabetes mellitus ([place]) 2012  . Viral pneumonia, unspecified 2004    Past Surgical History:  Procedure Laterality Date  . APPENDECTOMY    . COLONOSCOPY N/A 08/10/2015   Procedure:  COLONOSCOPY;  Surgeon: Rogene Houston, MD;  Location: [facility];  Service: Endoscopy;  Laterality: N/A;  730  . CORONARY STENT PLACEMENT    . DRUG INDUCED ENDOSCOPY N/A 11/30/2018   Procedure: DRUG INDUCED SLEEP ENDOSCOPY;  Surgeon: Melida Quitter, MD;  Location: [facility];  Service: ENT;  Laterality: N/A;  . FINGER SURGERY Right    index, gnagrene in finger.  Marland Kitchen KNEE ARTHROSCOPY     Left  . LEFT HEART CATHETERIZATION WITH CORONARY ANGIOGRAM N/A 03/20/2012   Procedure: LEFT HEART CATHETERIZATION WITH CORONARY ANGIOGRAM;  Surgeon: Thayer Headings, MD;  Location[facility] CATH LAB;  Service: Cardiovascular;  Laterality: N/A;  . MASS EXCISION N/A 08/23/2016   Procedure: EXCISION BASAL CELL CARCINOMA ON BACK OVER 4 CM;  Surgeon: Aviva Signs, MD;  Location: [facility] ORS;  Service: General;  Laterality: N/A;  . PORT-A-CATH REMOVAL    . PORTACATH PLACEMENT    . SKIN GRAFT SPLIT THICKNESS TRUNK    01/23/2010    Right chest;  Felicie Morn, M.D.  . VASECTOMY      Current Outpatient Medications  Medication Sig Dispense Refill  . ACCU-CHEK GUIDE test strip 2 (two) times daily. check    . Accu-Chek Softclix Lancets lancets 2 (two) times daily.    . Acetaminophen 500 MG capsule Take 500 mg by mouth every 4 (four) hours as needed (Pain).     Marland Kitchen albuterol (PROVENTIL HFA;VENTOLIN HFA) 108 (90 Base) MCG/ACT inhaler Inhale 1-2 puffs into the lungs daily as needed (for shortness of breath/wheezing). 1 Inhaler 0  . AMBULATORY NON FORMULARY MEDICATION Prostaglandin 10 mcq/ml  To bring medication to office for penile injection (Patient taking differently: Inject 10 mcg as directed See admin instructions. 4 rounds Prostaglandin 10 mcq/ml  To bring medication to office for penile injection) 1 mL 3  . aspirin EC 81 MG EC tablet Take 1 tablet (81 mg total) by mouth daily.    . Blood Glucose Monitoring Suppl (ACCU-CHEK GUIDE) w/Device KIT See admin instructions.    . budesonide (PULMICORT) 0.5 MG/2ML nebulizer solution Take 0.5 mg by nebulization every 6 (six) hours as needed (Shortness of breath).     . butalbital-acetaminophen-caffeine (FIORICET) 50-325-40 MG tablet Take 3 tablets by mouth every 4 (four) hours as needed for headache.     . chlorpheniramine-HYDROcodone (TUSSIONEX) 10-8 MG/5ML SUER Take 5 mLs by mouth daily as needed for cough.     . Cinnamon 500 MG TABS Take 1,000 mg by mouth daily.    . clonazePAM (KLONOPIN) 1 MG tablet Take 1 mg by mouth 3 (three) times daily.     . clopidogrel (PLAVIX) 75 MG tablet Take 75 mg by mouth daily.    .  Dulaglutide (TRULICITY) 1.5 LY/6.5KP SOPN Inject 1.5 mg as directed once a week.    . DULoxetine (CYMBALTA) 60 MG capsule Take 1 capsule (60 mg total) by mouth 2 (two) times daily. 60 capsule 0  . fluticasone (FLONASE) 50 MCG/ACT nasal spray Place 2 sprays into both nostrils daily as needed for allergies or rhinitis.     . Fluticasone Furoate (ARNUITY ELLIPTA) 100 MCG/ACT AEPB Inhale 1 puff into the lungs in the morning and at bedtime.    Marland Kitchen glimepiride (AMARYL) 4 MG tablet Take 1 tablet (4 mg total) by mouth daily with breakfast. For diabetes 30 tablet 0  . lidocaine (XYLOCAINE) 5 % ointment Apply 1 application topically daily as needed for mild pain.     Marland Kitchen lisinopril (PRINIVIL,ZESTRIL)  2.5 MG tablet Take 1 tablet (2.5 mg total) by mouth daily. 30 tablet 0  . metoprolol succinate (TOPROL-XL) 50 MG 24 hr tablet Take 50 mg by mouth daily.    . nitroGLYCERIN (NITROSTAT) 0.4 MG SL tablet Place 1 tablet (0.4 mg total) under the tongue every 5 (five) minutes as needed for chest pain. 5 tablet 1  . oxyCODONE-acetaminophen (PERCOCET) 10-325 MG tablet Take 1 tablet by mouth 3 (three) times daily as needed for pain.     . pantoprazole (PROTONIX) 40 MG tablet Take 1 tablet (40 mg total) by mouth 2 (two) times daily before a meal. 180 tablet 01  . promethazine (PHENERGAN) 25 MG tablet Take 1 tablet (25 mg total) by mouth 2 (two) times daily as needed for nausea or vomiting. for Nausea and Vomiting. 60 tablet 3  . rosuvastatin (CRESTOR) 10 MG tablet Take 1 tablet (10 mg total) by mouth daily. 30 tablet 0  . sitaGLIPtin-metformin (JANUMET) 50-1000 MG tablet Take 1 tablet by mouth 2 (two) times daily with a meal.    . tadalafil (CIALIS) 20 MG tablet Take 20 mg by mouth daily as needed for erectile dysfunction.    . tamsulosin (FLOMAX) 0.4 MG CAPS capsule Take 1 capsule (0.4 mg total) by mouth daily. 30 capsule 0  . zolpidem (AMBIEN) 10 MG tablet Take 15 mg by mouth at bedtime.     No current facility-administered  medications for this visit.   Allergies:  Bupropion, Trazodone, Ketorolac, Acetaminophen, Advil [ibuprofen], Dilaudid [hydromorphone], Mobic [meloxicam], Naproxen, Nsaids, Tramadol, Vicodin [hydrocodone-acetaminophen], Cimetidine, Gabapentin, and Tape   Social History: The patient  reports that he has been smoking cigarettes. He has a 17.50 pack-year smoking history. He quit smokeless tobacco use about 13 years ago.  His smokeless tobacco use included chew. He reports current alcohol use of about 2.0 - 3.0 standard drinks of alcohol per week. He reports that he does not use drugs.   Family History: The patient's family history includes Cancer in his father; Deep vein thrombosis in his mother; Diabetes in his mother and sister; Pulmonary embolism in his mother.   ROS:  Please see the history of present illness. Otherwise, complete review of systems is positive for none.  All other systems are reviewed and negative.   Physical Exam: VS:  BP 140/80   Pulse 91   Ht _0  (1.803 m)   Wt 160 lb (72.6 kg)   BMI 22.32 kg/m , BMI Body mass index is 22.32 kg/m.  Wt Readings from Last 3 Encounters:  09/17/19 160 lb (72.6 kg)  09/08/19 160 lb 3.2 oz (72.7 kg)  08/23/19 158 lb (71.7 kg)    General: Patient appears comfortable at rest. Neck: Supple, no elevated JVP or carotid bruits, no thyromegaly. Lungs: Clear to auscultation, nonlabored breathing at rest. Cardiac: Regular rate and rhythm, no S3 or significant systolic murmur, no pericardial rub. Extremities: No pitting edema, distal pulses 2+. Skin: Warm and dry. Musculoskeletal: No kyphosis. Neuropsychiatric: Alert and oriented x3, affect grossly appropriate.  ECG:  An ECG dated 09/17/2019 was personally reviewed today and demonstrated:  Sinus rhythm with premature atrial complexes, possible left atrial enlargement, right bundle branch block, left anterior fascicular block, septal infarct age undetermined.  Recent Labwork: 02/22/2019: BUN  15; Creatinine, Ser 0.95; Hemoglobin 16.4; Platelets 221; Potassium 5.0; Sodium 134     Component Value Date/Time   CHOL 142 09/16/2015 0410   CHOL 134 09/11/2012 0903   TRIG 112 09/16/2015 0410  TRIG 306 (H) 02/17/2014 0914   TRIG 93 09/11/2012 0903   HDL 36 (L) 09/16/2015 0410   HDL 26 (L) 02/17/2014 0914   HDL 36 (L) 09/11/2012 0903   CHOLHDL 3.9 09/16/2015 0410   VLDL 22 09/16/2015 0410   LDLCALC 84 09/16/2015 0410   LDLCALC 139 (H) 11/10/2013 0851   LDLCALC 79 09/11/2012 0903    Other Studies Reviewed Today:  Lexiscan Myoview stress test 05/12/2017 Study Result  Narrative & Impression   There was no ST segment deviation noted during stress.  Findings consistent with prior distal anterolateral/apical myocardial infarction. There is a moderate sized inferolateral defect with mild reversibility. There is a significant amount of gut radiotracer adjacent to this segment which may create artifact. Findings would suggest moderate infarct with mild peri-infarct ischemia  The left ventricular ejection fraction is normal (51%).  This is a low risk study.      Echocardiogram 04/18/2017 Study Conclusions   - Left ventricle: The cavity size was normal. Wall thickness was  normal. Systolic function was moderately reduced. The estimated  ejection fraction was 40%. Features are consistent with a  pseudonormal left ventricular filling pattern, with concomitant  abnormal relaxation and increased filling pressure (grade 2  diastolic dysfunction).  - Regional wall motion abnormality: Hypokinesis of the mid  inferolateral, mid anterolateral, and apical lateral myocardium;  mild hypokinesis of the mid-apical inferior myocardium.  Assessment and Plan:  1. Preoperative clearance   2. CAD in native artery   3. Ischemic cardiomyopathy   4. Essential hypertension, benign   5. Hyperlipidemia with target LDL less than 100    1. Preoperative clearance Requiring cardiac  clearance for colonoscopy.  Patient is scheduled for EGD and colonoscopy with Dr. Laural Golden.  Patient's revised cardiac risk index = 1 placing indicating a perioperative risk of major cardiac event at 0.9%.  Patient is cleared from a cardiac standpoint to undergo colonoscopy/EGD.  2. CAD in native artery Plavix was stopped at last visit and 05/02/2017 denies any progressive anginal or exertional symptoms.  He does have some chronic dyspnea from COPD.  Continue aspirin 81 mg, nitroglycerin sublingual 0.4 mg as needed for chest pain.  Continue Plavix 75 mg daily.  3. Ischemic cardiomyopathy Last echocardiogram 04/18/2017 showed EF of 40% with grade 2 diastolic dysfunction, regional wall motion abnormality: Hypokinesis of the mid inferior lateral and mid anterolateral and apical lateral myocardial.  Mild hypokinesis of the mid apical inferior myocardium.  4. Essential hypertension, benign Blood pressure 140/80 today.  Blood pressures at previous measurements were better.  Continue lisinopril 2.5 mg daily.  Toprol-XL 50 mg daily.  5. Hyperlipidemia with target LDL less than 100 Last cholesterol numbers in our system 09/16/2015 showed total cholesterol 142, triglycerides 112, HDL 36, LDL 84.  Please obtain recent PCP lab work to check for a more recent lipid panel.  Continue Crestor 10 mg daily   Medication Adjustments/Labs and Tests Ordered: Current medicines are reviewed at length with the patient today.  Concerns regarding medicines are outlined above.   Disposition: Follow-up with [facility] or APP in 1 year  Signed, Levell July, NP 09/17/2019 2:01 PM    [facility] at [place], [place],  [address] Phone: [phone]; Fax: [phone]

## 2019-09-17 ENCOUNTER — Encounter: Payer: Self-pay | Admitting: Family Medicine

## 2019-09-17 ENCOUNTER — Ambulatory Visit (INDEPENDENT_AMBULATORY_CARE_PROVIDER_SITE_OTHER): Payer: Medicare Other | Admitting: Family Medicine

## 2019-09-17 ENCOUNTER — Other Ambulatory Visit: Payer: Self-pay

## 2019-09-17 VITALS — BP 140/80 | HR 91 | Ht 71.0 in | Wt 160.0 lb

## 2019-09-17 DIAGNOSIS — I255 Ischemic cardiomyopathy: Secondary | ICD-10-CM | POA: Diagnosis not present

## 2019-09-17 DIAGNOSIS — Z01810 Encounter for preprocedural cardiovascular examination: Secondary | ICD-10-CM | POA: Diagnosis not present

## 2019-09-17 DIAGNOSIS — Z01818 Encounter for other preprocedural examination: Secondary | ICD-10-CM

## 2019-09-17 DIAGNOSIS — I10 Essential (primary) hypertension: Secondary | ICD-10-CM | POA: Diagnosis not present

## 2019-09-17 DIAGNOSIS — I2510 Atherosclerotic heart disease of native coronary artery without angina pectoris: Secondary | ICD-10-CM

## 2019-09-17 DIAGNOSIS — E785 Hyperlipidemia, unspecified: Secondary | ICD-10-CM

## 2019-09-17 NOTE — Patient Instructions (Signed)
Medication Instructions:  Your physician recommends that you continue on your current medications as directed. Please refer to the Current Medication list given to you today.  *If you need a refill on your cardiac medications before your next appointment, please call your pharmacy*   Lab Work: None If you have labs (blood work) drawn today and your tests are completely normal, you will receive your results only by: Marland Kitchen MyChart Message (if you have MyChart) OR . A paper copy in the mail If you have any lab test that is abnormal or we need to change your treatment, we will call you to review the results.   Testing/Procedures: None   Follow-Up: At [facility], you and your health needs are our priority.  As part of our continuing mission to provide you with exceptional heart care, we have created designated Provider Care Teams.  These Care Teams include your primary Cardiologist (physician) and Advanced Practice Providers (APPs -  Physician Assistants and Nurse Practitioners) who all work together to provide you with the care you need, when you need it.  We recommend signing up for the patient portal called "MyChart".  Sign up information is provided on this After Visit Summary.  MyChart is used to connect with patients for Virtual Visits (Telemedicine).  Patients are able to view lab/test results, encounter notes, upcoming appointments, etc.  Non-urgent messages can be sent to your provider as well.   To learn more about what you can do with MyChart, go to [email or web address].    Your next appointment:   12 month(s)  The format for your next appointment:   In Person  Provider:   You may see Dr Bronson Ing or one of the following Advanced Practice Providers on your designated Care Team:    [place], PA-C   Ermalinda Barrios, [place]     Other Instructions None

## 2019-09-21 ENCOUNTER — Encounter: Payer: Self-pay | Admitting: Urology

## 2019-09-21 NOTE — Patient Instructions (Signed)
Collagenase injection (Dupuytren's Contracture/Peyronie's Disease) What is this medicine? COLLAGENASE (kohl LAH jen ace) is used to treat Dupuytren's contracture. This medicine may help straighten a bent finger by breaking up hard tissue. It is also used for Peyronie's disease by breaking up the hard tissue plaque that causes the curvature in the penis. This medicine may be used for other purposes; ask your health care provider or pharmacist if you have questions. COMMON BRAND NAME(S): Xiaflex What should I tell my health care provider before I take this medicine? They need to know if you have any of these conditions:  hemophilia  low platelet counts  take medicines that treat or prevent blood clots  an unusual or allergic reaction to collagenase, other medicines, foods, dyes, or preservatives  pregnant or trying to get pregnant  breast-feeding How should I use this medicine? This medicine is for injection into the hand or penis. It is given by a health care professional in a hospital or clinic setting. A special MedGuide will be given to you by the pharmacist with each prescription and refill. Be sure to read this information carefully each time. Talk to your pediatrician regarding the use of this medicine in children. Special care may be needed. Overdosage: If you think you have taken too much of this medicine contact a poison control center or emergency room at once. NOTE: This medicine is only for you. Do not share this medicine with others. What if I miss a dose? It is important not to miss your dose. Call your doctor or health care professional if you are unable to keep an appointment. What may interact with this medicine?  aspirin and aspirin-like medicines  certain medicines that treat or prevent blood clots like warfarin, enoxaparin, and dalteparin This list may not describe all possible interactions. Give your health care provider a list of all the medicines, herbs,  non-prescription drugs, or dietary supplements you use. Also tell them if you smoke, drink alcohol, or use illegal drugs. Some items may interact with your medicine. What should I watch for while using this medicine? Your condition will be monitored carefully while you are receiving this medicine. If being treated for Dupuytren's contracture, return to your healthcare provider the day after your hand is injected. In the meantime, do not flex or extend the fingers of your hand that was injected. Do not touch your finger that was injected, and elevate your hand until bedtime. Do not perform activity with the injected hand until you are told that it is OK. Follow any instructions about wearing a splint or performing finger exercises. Also, call your healthcare provider if you get increasing redness or swelling in the hand, if you have numbness or tingling in the treated finger, or if you have trouble bending the finger after the swelling goes down. If being treated for Peyronie's disease, you will need to return to your healthcare provider for a manual procedure that will stretch and help straighten your penis. Also, your healthcare provider will show you how to gently stretch your penis at home. Do not resume sexual activity until you are told that it is okay. Follow instructions on when to return for follow-up visits. Immediately call your doctor if you have trouble stretching or straightening your penis, or if you have pain or other concerns. Immediately call your healthcare provider if you get a fever or chills. What side effects may I notice from receiving this medicine? Side effects that you should report to your doctor or health   care professional as soon as possible:  allergic reactions like skin rash, itching or hives, swelling of the face, lips, or tongue  breathing problems  chest pain or palpitations  pain in your penis  pain when urinating  red or dark-brown urine  sudden loss of the  ability to maintain an erection  swelling of the injected hand  unusual swelling or bruising of the penis Side effects that usually do not require medical attention (report to your doctor or health care professional if they continue or are bothersome):  irritation at site where injected  pain at site where injected  unusual bleeding or bruising This list may not describe all possible side effects. Call your doctor for medical advice about side effects. You may report side effects to FDA at 1-800-FDA-1088. Where should I keep my medicine? This drug is given in a hospital or clinic and will not be stored at home. NOTE: This sheet is a summary. It may not cover all possible information. If you have questions about this medicine, talk to your doctor, pharmacist, or health care provider.  2020 Elsevier/Gold Standard (2015-04-20 09:34:13)  

## 2019-09-21 NOTE — Progress Notes (Signed)
08/23/2019 3:43 PM   Derek Chen 1960/08/01 [id number]  Referring provider: Jessee Avers, [place],  [place] [address]  peyronies disease  HPI: Mild bruising from 2nd injection. No penile pain. Patient here for penile modeling   PMH: Past Medical History:  Diagnosis Date  . Anxiety   . Arthritis   . Basal cell cancer    Chest and back - status post skin grafting  . BPH (benign prostatic hyperplasia)   . Cardiomyopathy (HCC)    LVEF 45-50%  . CHF (congestive heart failure) ([place])   . Complication of anesthesia    Difficult to wake up after anesthesia  . COPD (chronic obstructive pulmonary disease) ([place])   . Coronary artery disease    NSTEMI - DES mid LAD December 2011 (patent December 2013)  . Dyslipidemia   . Essential hypertension, benign   . GERD (gastroesophageal reflux disease)   . Hepatitis B antibody positive 2006  . History of kidney stones   . MI (myocardial infarction) ([place])    3 MI's last in 2016.  Marland Kitchen PONV (postoperative nausea and vomiting)    pt states that zofran and scope patch did not help PONV after last surgery.  . Renal insufficiency   . Sleep apnea   . Type 2 diabetes mellitus ([place]) 2012  . Viral pneumonia, unspecified 2004    Surgical History: Past Surgical History:  Procedure Laterality Date  . APPENDECTOMY    . COLONOSCOPY N/A 08/10/2015   Procedure: COLONOSCOPY;  Surgeon: Rogene Houston, MD;  [facility];  Service: Endoscopy;  Laterality: N/A;  730  . CORONARY STENT PLACEMENT    . DRUG INDUCED ENDOSCOPY N/A 11/30/2018   Procedure: DRUG INDUCED SLEEP ENDOSCOPY;  Surgeon: Melida Quitter, MD;  Location: [facility];  Service: ENT;  Laterality: N/A;  . FINGER SURGERY Right    index, gnagrene in finger.  Marland Kitchen KNEE ARTHROSCOPY     Left  . LEFT HEART CATHETERIZATION WITH CORONARY ANGIOGRAM N/A 03/20/2012   Procedure: LEFT HEART CATHETERIZATION WITH CORONARY ANGIOGRAM;  Surgeon: Thayer Headings, MD;   Location: [facility] CATH LAB;  Service: Cardiovascular;  Laterality: N/A;  . MASS EXCISION N/A 08/23/2016   Procedure: EXCISION BASAL CELL CARCINOMA ON BACK OVER 4 CM;  Surgeon: Aviva Signs, MD;  Location: [facility];  Service: General;  Laterality: N/A;  . PORT-A-CATH REMOVAL    . PORTACATH PLACEMENT    . SKIN GRAFT SPLIT THICKNESS TRUNK    01/23/2010    Right chest;  Felicie Morn, M.D.  . VASECTOMY      Home Medications:  Allergies as of 08/23/2019      Reactions   Bupropion Other (See Comments)   paralyzed   Trazodone Palpitations, Other (See Comments)   paralyzed   Ketorolac Diarrhea   Acetaminophen Other (See Comments)   Advil [ibuprofen] Itching   Dilaudid [hydromorphone] Nausea And Vomiting   Tolerates IV formulation--unable to tolerate pill form   Mobic [meloxicam] Other (See Comments)   Severe headaches.   Naproxen Nausea Only   Nsaids Other (See Comments)   Tramadol Hives, Itching   Vicodin [hydrocodone-acetaminophen] Hives   Cimetidine Other (See Comments), Rash   headaches Severe headache   Gabapentin Anxiety   "Neck twitches"   Tape Rash   Paper tape      Medication List       Accurate as of Aug 23, 2019 11:59 PM. If you have any questions, ask  your nurse or doctor.        Accu-Chek Guide test strip Generic drug: glucose blood 2 (two) times daily. check   Accu-Chek Guide w/Device Kit See admin instructions.   Accu-Chek Softclix Lancets lancets 2 (two) times daily.   Acetaminophen 500 MG capsule Take 500 mg by mouth every 4 (four) hours as needed (Pain).   albuterol 108 (90 Base) MCG/ACT inhaler Commonly known as: VENTOLIN HFA Inhale 1-2 puffs into the lungs daily as needed (for shortness of breath/wheezing).   AMBULATORY NON FORMULARY MEDICATION Prostaglandin 10 mcq/ml  To bring medication to office for penile injection   Anoro Ellipta 62.5-25 MCG/INH Aepb Generic drug: umeclidinium-vilanterol Inhale 1 puff into the lungs as needed.     aspirin 81 MG EC tablet Take 1 tablet (81 mg total) by mouth daily.   budesonide 0.5 MG/2ML nebulizer solution Commonly known as: PULMICORT Take 0.5 mg by nebulization every 6 (six) hours as needed (Shortness of breath).   butalbital-acetaminophen-caffeine 50-325-40 MG tablet Commonly known as: FIORICET Take 3 tablets by mouth every 4 (four) hours as needed for headache.   chlorpheniramine-HYDROcodone 10-8 MG/5ML Suer Commonly known as: TUSSIONEX Take 5 mLs by mouth daily as needed for cough.   Cinnamon 500 MG Tabs Take 1,000 mg by mouth daily.   clonazePAM 1 MG tablet Commonly known as: KLONOPIN Take 1 mg by mouth 3 (three) times daily.   clopidogrel 75 MG tablet Commonly known as: PLAVIX Take 75 mg by mouth daily.   DULoxetine 60 MG capsule Commonly known as: Cymbalta Take 1 capsule (60 mg total) by mouth 2 (two) times daily. What changed:   how much to take  when to take this   Flovent Diskus 50 MCG/BLIST diskus inhaler Generic drug: fluticasone Inhale 2 puffs into the lungs daily.   fluticasone 50 MCG/ACT nasal spray Commonly known as: FLONASE Place 2 sprays into both nostrils daily as needed for allergies or rhinitis.   glimepiride 4 MG tablet Commonly known as: AMARYL Take 1 tablet (4 mg total) by mouth daily with breakfast. For diabetes   hydrOXYzine 50 MG tablet Commonly known as: ATARAX/VISTARIL Take 50 mg by mouth daily.   Januvia 100 MG tablet Generic drug: sitaGLIPtin Take 100 mg by mouth daily.   lidocaine 5 % ointment Commonly known as: XYLOCAINE Apply 1 application topically daily as needed for mild pain.   lisinopril 2.5 MG tablet Commonly known as: ZESTRIL Take 1 tablet (2.5 mg total) by mouth daily.   meclizine 25 MG tablet Commonly known as: ANTIVERT Take 25 mg by mouth as needed.   metFORMIN 1000 MG tablet Commonly known as: GLUCOPHAGE Take 1 tablet (1,000 mg total) by mouth 2 (two) times daily with a meal.   metoprolol  succinate 50 MG 24 hr tablet Commonly known as: TOPROL-XL Take 50 mg by mouth daily.   nitroGLYCERIN 0.4 MG SL tablet Commonly known as: NITROSTAT Place 1 tablet (0.4 mg total) under the tongue every 5 (five) minutes as needed for chest pain.   oxyCODONE-acetaminophen 10-325 MG tablet Commonly known as: PERCOCET Take 1 tablet by mouth 3 (three) times daily as needed for pain.   pantoprazole 40 MG tablet Commonly known as: Protonix Take 1 tablet (40 mg total) by mouth daily.   promethazine 25 MG tablet Commonly known as: PHENERGAN Take 1 tablet (25 mg total) by mouth daily. for Nausea and Vomiting. What changed:   how much to take  when to take this  additional instructions  rosuvastatin 10 MG tablet Commonly known as: CRESTOR Take 1 tablet (10 mg total) by mouth daily.   senna-docusate 8.6-50 MG tablet Commonly known as: Senokot-S   sildenafil 50 MG tablet Commonly known as: VIAGRA 50 mg as needed.   tamsulosin 0.4 MG Caps capsule Commonly known as: FLOMAX Take 1 capsule (0.4 mg total) by mouth daily. What changed: how much to take   Xtampza ER 13.5 MG C12a Generic drug: oxyCODONE ER TAKE 1 CAPSULE AND SPRINKLE ENTIRE CONTENTS ON SOFT FOODS OR INTO A CUP THEN IMMEDIATELY TAKE BY MOUTH EVERY TWELVE HOURS   zolpidem 10 MG tablet Commonly known as: AMBIEN Take 15 mg by mouth at bedtime.       Allergies:  Allergies  Allergen Reactions  . Bupropion Other (See Comments)    paralyzed  . Trazodone Palpitations and Other (See Comments)    paralyzed  . Ketorolac Diarrhea  . Acetaminophen Hives    Too much give him hives  . Advil [Ibuprofen] Itching  . Dilaudid [Hydromorphone] Nausea And Vomiting    Tolerates IV formulation--unable to tolerate pill form  . Mobic [Meloxicam] Other (See Comments)    Severe headaches.  . Naproxen Nausea Only  . Nsaids Hives    Take to much causes hives  . Tramadol Hives and Itching  . Vicodin [Hydrocodone-Acetaminophen]  Hives  . Cimetidine Other (See Comments) and Rash    headaches Severe headache  . Gabapentin Anxiety    "Neck twitches"  . Tape Rash    Paper tape    Family History: Family History  Problem Relation Age of Onset  . Diabetes Mother   . Deep vein thrombosis Mother   . Pulmonary embolism Mother   . Cancer Father        hodgins lymphoma  . Diabetes Sister     Social History:  reports that he has been smoking cigarettes. He has a 17.50 pack-year smoking history. He quit smokeless tobacco use about 13 years ago.  His smokeless tobacco use included chew. He reports current alcohol use of about 2.0 - 3.0 standard drinks of alcohol per week. He reports that he does not use drugs.  ROS: All other review of systems were reviewed and are negative except what is noted above in HPI  Physical Exam: BP 108/67   Pulse (!) 111   Temp 97.9 F (36.6 C)   Ht '5\' 11"'$  (1.803 m)   Wt 158 lb (71.7 kg)   BMI 22.04 kg/m   Constitutional:  Alert and oriented, No acute distress. HEENT: [place] AT, moist mucus membranes.  Trachea midline, no masses. Cardiovascular: No clubbing, cyanosis, or edema. Respiratory: Normal respiratory effort, no increased work of breathing. GI: Abdomen is soft, nontender, nondistended, no abdominal masses GU: No CVA tenderness.  Lymph: No cervical or inguinal lymphadenopathy. Skin: No rashes, bruises or suspicious lesions. Neurologic: Grossly intact, no focal deficits, moving all 4 extremities. Psychiatric: Normal mood and affect.  Laboratory Data: Lab Results  Component Value Date   WBC 10.7 (H) 02/22/2019   HGB 16.4 02/22/2019   HCT 49.3 02/22/2019   MCV 90.6 02/22/2019   PLT 221 02/22/2019    Lab Results  Component Value Date   CREATININE 0.95 02/22/2019    Lab Results  Component Value Date   PSA CANCELED 11/06/2012   PSA 0.6 11/06/2012    Lab Results  Component Value Date   TESTOSTERONE 212 (L) 08/09/2013    Lab Results  Component Value Date    HGBA1C  10.2 (H) 02/22/2019    Urinalysis    Component Value Date/Time   COLORURINE YELLOW 03/18/2012 0402   APPEARANCEUR CLEAR 03/18/2012 0402   LABSPEC >1.030 (H) 03/18/2012 0402   PHURINE 5.5 03/18/2012 0402   GLUCOSEU NEGATIVE 03/18/2012 0402   HGBUR NEGATIVE 03/18/2012 0402   BILIRUBINUR NEGATIVE 03/18/2012 0402   KETONESUR NEGATIVE 03/18/2012 0402   PROTEINUR NEGATIVE 03/18/2012 0402   UROBILINOGEN 0.2 03/18/2012 0402   NITRITE NEGATIVE 03/18/2012 0402   LEUKOCYTESUR NEGATIVE 03/18/2012 0402    Lab Results  Component Value Date   LABMICR 8.9 11/09/2012    Pertinent Imaging:  Results for orders placed in visit on 11/30/13  DG Abd 1 View  Narrative CLINICAL DATA:  Left flank pain  EXAM: ABDOMEN - 1 VIEW  COMPARISON:  None.  FINDINGS: The bowel gas pattern is normal. No radio-opaque calculi or other significant radiographic abnormality are seen.  IMPRESSION: No acute abnormality noted.   Electronically Signed By: Inez Catalina M.D. On: 11/30/2013 10:37  No results found for this or any previous visit.  No results found for this or any previous visit.  No results found for this or any previous visit.  No results found for this or any previous visit.  No results found for this or any previous visit.  No results found for this or any previous visit.  No results found for this or any previous visit.   Assessment & Plan:    1. Peyronie's disease -penile modeling taught to patient today. The patient was instructed to refrain from sexual intercourse for 4 weeks   Return in about 6 weeks (around 10/04/2019).  Nicolette Bang, MD  [facility] [place]

## 2019-09-23 ENCOUNTER — Telehealth: Payer: Self-pay | Admitting: Family Medicine

## 2019-09-23 ENCOUNTER — Telehealth: Payer: Self-pay

## 2019-09-23 NOTE — Telephone Encounter (Signed)
Derek Chen for [facility] would like to verify medication changes made by Quinn,NP.   Please call  [phone] Fax [phone]   Thanks renee

## 2019-09-23 NOTE — Telephone Encounter (Signed)
Pt called regarding need for new pharmacy location that did medication compounding. Pt made aware of [facility] in [place]. Rx. sent via fax successfully.

## 2019-09-23 NOTE — Telephone Encounter (Signed)
Faxed office note to provider

## 2019-09-27 ENCOUNTER — Ambulatory Visit: Payer: Medicare Other | Admitting: Urology

## 2019-09-28 NOTE — Patient Instructions (Signed)
Derek Chen  09/28/2019     @PREFPERIOPPHARMACY @   Your procedure is scheduled on 10/01/2019.  Report to [facility] at 9:30 A.M.  Call this number if you have problems the morning of surgery:  [phone]   Remember:  Do not eat or drink after midnight.   Please follow instructions given to you by Dr. Olevia Perches office    Take these medicines the morning of surgery with A SIP OF WATER : Klonopin, Cymbalta, Protonix, Flonase, Metoprolol and    Oxycodone      Do not wear jewelry, make-up or nail polish.  Do not wear lotions, powders, or perfumes, or deodorant.  Do not shave 48 hours prior to surgery.  Men may shave face and neck.  Do not bring valuables to the hospital.  [facility] is not responsible for any belongings or valuables.  Contacts, dentures or bridgework may not be worn into surgery.  Leave your suitcase in the car.  After surgery it may be brought to your room.  For patients admitted to the hospital, discharge time will be determined by your treatment team.  Patients discharged the day of surgery will not be allowed to drive home.   Name and phone number of your driver:   family Special instructions:  n/a  Please read over the following fact sheets that you were given. Care and Recovery After Surgery    Upper Endoscopy, Adult Upper endoscopy is a procedure to look inside the upper GI (gastrointestinal) tract. The upper GI tract is made up of:  The part of the body that moves food from your mouth to your stomach (esophagus).  The stomach.  The first part of your small intestine (duodenum). This procedure is also called esophagogastroduodenoscopy (EGD) or gastroscopy. In this procedure, your health care provider passes a thin, flexible tube (endoscope) through your mouth and down your esophagus into your stomach. A small camera is attached to the end of the tube. Images from the camera appear on a monitor in the exam room. During this procedure, your health  care provider may also remove a small piece of tissue to be sent to a lab and examined under a microscope (biopsy). Your health care provider may do an upper endoscopy to diagnose cancers of the upper GI tract. You may also have this procedure to find the cause of other conditions, such as:  Stomach pain.  Heartburn.  Pain or problems when swallowing.  Nausea and vomiting.  Stomach bleeding.  Stomach ulcers. Tell a health care provider about:  Any allergies you have.  All medicines you are taking, including vitamins, herbs, eye drops, creams, and over-the-counter medicines.  Any problems you or family members have had with anesthetic medicines.  Any blood disorders you have.  Any surgeries you have had.  Any medical conditions you have.  Whether you are pregnant or may be pregnant. What are the risks? Generally, this is a safe procedure. However, problems may occur, including:  Infection.  Bleeding.  Allergic reactions to medicines.  A tear or hole (perforation) in the esophagus, stomach, or duodenum. What happens before the procedure? Staying hydrated Follow instructions from your health care provider about hydration, which may include:  Up to 2 hours before the procedure - you may continue to drink clear liquids, such as water, clear fruit juice, black coffee, and plain tea.  Eating and drinking restrictions Follow instructions from your health care provider about eating and drinking, which may include:  8 hours  before the procedure - stop eating heavy meals or foods, such as meat, fried foods, or fatty foods.  6 hours before the procedure - stop eating light meals or foods, such as toast or cereal.  6 hours before the procedure - stop drinking milk or drinks that contain milk.  2 hours before the procedure - stop drinking clear liquids. Medicines Ask your health care provider about:  Changing or stopping your regular medicines. This is especially important  if you are taking diabetes medicines or blood thinners.  Taking medicines such as aspirin and ibuprofen. These medicines can thin your blood. Do not take these medicines unless your health care provider tells you to take them.  Taking over-the-counter medicines, vitamins, herbs, and supplements. General instructions  Plan to have someone take you home from the hospital or clinic.  If you will be going home right after the procedure, plan to have someone with you for 24 hours.  Ask your health care provider what steps will be taken to help prevent infection. What happens during the procedure?   An IV will be inserted into one of your veins.  You may be given one or more of the following: ? A medicine to help you relax (sedative). ? A medicine to numb the throat (local anesthetic).  You will lie on your left side on an exam table.  Your health care provider will pass the endoscope through your mouth and down your esophagus.  Your health care provider will use the scope to check the inside of your esophagus, stomach, and duodenum. Biopsies may be taken.  The endoscope will be removed. The procedure may vary among health care providers and hospitals. What happens after the procedure?  Your blood pressure, heart rate, breathing rate, and blood oxygen level will be monitored until you leave the hospital or clinic.  Do not drive for 24 hours if you were given a sedative during your procedure.  When your throat is no longer numb, you may be given some fluids to drink.  It is up to you to get the results of your procedure. Ask your health care provider, or the department that is doing the procedure, when your results will be ready. Summary  Upper endoscopy is a procedure to look inside the upper GI tract.  During the procedure, an IV will be inserted into one of your veins. You may be given a medicine to help you relax.  A medicine will be used to numb your throat.  The endoscope  will be passed through your mouth and down your esophagus. This information is not intended to replace advice given to you by your health care provider. Make sure you discuss any questions you have with your health care provider. Document Revised: 09/10/2017 Document Reviewed: 08/18/2017 Elsevier Patient Education  [place].

## 2019-09-29 ENCOUNTER — Other Ambulatory Visit (HOSPITAL_COMMUNITY)
Admission: RE | Admit: 2019-09-29 | Discharge: 2019-09-29 | Disposition: A | Discharge status: Home / Self Care | Payer: Medicare Other | Source: Ambulatory Visit | Attending: Internal Medicine | Admitting: Internal Medicine

## 2019-09-29 ENCOUNTER — Encounter (HOSPITAL_COMMUNITY)
Admission: RE | Admit: 2019-09-29 | Discharge: 2019-09-29 | Disposition: A | Discharge status: Home / Self Care | Payer: Medicare Other | Source: Ambulatory Visit | Attending: Internal Medicine | Admitting: Internal Medicine

## 2019-09-29 ENCOUNTER — Ambulatory Visit: Payer: Medicare Other | Admitting: Urology

## 2019-09-29 ENCOUNTER — Encounter (HOSPITAL_COMMUNITY): Payer: Self-pay

## 2019-09-29 ENCOUNTER — Other Ambulatory Visit: Payer: Self-pay

## 2019-09-29 DIAGNOSIS — Z20822 Contact with and (suspected) exposure to covid-19: Secondary | ICD-10-CM | POA: Diagnosis not present

## 2019-09-29 DIAGNOSIS — Z01818 Encounter for other preprocedural examination: Secondary | ICD-10-CM | POA: Insufficient documentation

## 2019-09-29 LAB — BASIC METABOLIC PANEL
Anion gap: 12 (ref 5–15)
BUN: 17 mg/dL (ref 6–20)
CO2: 22 mmol/L (ref 22–32)
Calcium: 9.1 mg/dL (ref 8.9–10.3)
Chloride: 103 mmol/L (ref 98–111)
Creatinine, Ser: 0.85 mg/dL (ref 0.61–1.24)
GFR calc Af Amer: >60 mL/min (ref >60)
GFR calc non Af Amer: >60 mL/min (ref >60)
Glucose, Bld: 308 mg/dL — ABNORMAL HIGH (ref 70–99)
Potassium: 4.1 mmol/L (ref 3.5–5.1)
Sodium: 137 mmol/L (ref 135–145)

## 2019-09-29 LAB — SARS CORONAVIRUS 2 (TAT 6-24 HRS): SARS Coronavirus 2: NEGATIVE

## 2019-10-01 ENCOUNTER — Encounter (HOSPITAL_COMMUNITY)
Admission: RE | Disposition: A | Discharge status: Home / Self Care | Payer: Self-pay | Source: Home / Self Care | Attending: Internal Medicine

## 2019-10-01 ENCOUNTER — Encounter (HOSPITAL_COMMUNITY): Payer: Self-pay | Admitting: Internal Medicine

## 2019-10-01 ENCOUNTER — Ambulatory Visit (HOSPITAL_COMMUNITY): Payer: Medicare Other | Admitting: Anesthesiology

## 2019-10-01 ENCOUNTER — Ambulatory Visit (HOSPITAL_COMMUNITY)
Admission: RE | Admit: 2019-10-01 | Discharge: 2019-10-01 | Disposition: A | Discharge status: Home / Self Care | Payer: Medicare Other | Attending: Internal Medicine | Admitting: Internal Medicine

## 2019-10-01 DIAGNOSIS — Z79899 Other long term (current) drug therapy: Secondary | ICD-10-CM | POA: Insufficient documentation

## 2019-10-01 DIAGNOSIS — G4730 Sleep apnea, unspecified: Secondary | ICD-10-CM | POA: Diagnosis not present

## 2019-10-01 DIAGNOSIS — Z7902 Long term (current) use of antithrombotics/antiplatelets: Secondary | ICD-10-CM | POA: Insufficient documentation

## 2019-10-01 DIAGNOSIS — K219 Gastro-esophageal reflux disease without esophagitis: Secondary | ICD-10-CM

## 2019-10-01 DIAGNOSIS — Z888 Allergy status to other drugs, medicaments and biological substances status: Secondary | ICD-10-CM | POA: Insufficient documentation

## 2019-10-01 DIAGNOSIS — R634 Abnormal weight loss: Secondary | ICD-10-CM | POA: Diagnosis not present

## 2019-10-01 DIAGNOSIS — D128 Benign neoplasm of rectum: Secondary | ICD-10-CM | POA: Insufficient documentation

## 2019-10-01 DIAGNOSIS — Z1211 Encounter for screening for malignant neoplasm of colon: Secondary | ICD-10-CM | POA: Insufficient documentation

## 2019-10-01 DIAGNOSIS — N189 Chronic kidney disease, unspecified: Secondary | ICD-10-CM | POA: Insufficient documentation

## 2019-10-01 DIAGNOSIS — Z7982 Long term (current) use of aspirin: Secondary | ICD-10-CM | POA: Insufficient documentation

## 2019-10-01 DIAGNOSIS — K648 Other hemorrhoids: Secondary | ICD-10-CM | POA: Insufficient documentation

## 2019-10-01 DIAGNOSIS — J449 Chronic obstructive pulmonary disease, unspecified: Secondary | ICD-10-CM | POA: Insufficient documentation

## 2019-10-01 DIAGNOSIS — K621 Rectal polyp: Secondary | ICD-10-CM

## 2019-10-01 DIAGNOSIS — Z885 Allergy status to narcotic agent status: Secondary | ICD-10-CM | POA: Insufficient documentation

## 2019-10-01 DIAGNOSIS — R197 Diarrhea, unspecified: Secondary | ICD-10-CM

## 2019-10-01 DIAGNOSIS — K228 Other specified diseases of esophagus: Secondary | ICD-10-CM

## 2019-10-01 DIAGNOSIS — K3189 Other diseases of stomach and duodenum: Secondary | ICD-10-CM | POA: Insufficient documentation

## 2019-10-01 DIAGNOSIS — E119 Type 2 diabetes mellitus without complications: Secondary | ICD-10-CM

## 2019-10-01 DIAGNOSIS — R1310 Dysphagia, unspecified: Secondary | ICD-10-CM

## 2019-10-01 DIAGNOSIS — I2510 Atherosclerotic heart disease of native coronary artery without angina pectoris: Secondary | ICD-10-CM | POA: Diagnosis not present

## 2019-10-01 DIAGNOSIS — I429 Cardiomyopathy, unspecified: Secondary | ICD-10-CM | POA: Diagnosis not present

## 2019-10-01 DIAGNOSIS — I252 Old myocardial infarction: Secondary | ICD-10-CM | POA: Insufficient documentation

## 2019-10-01 DIAGNOSIS — M1990 Unspecified osteoarthritis, unspecified site: Secondary | ICD-10-CM | POA: Insufficient documentation

## 2019-10-01 DIAGNOSIS — K2980 Duodenitis without bleeding: Secondary | ICD-10-CM | POA: Insufficient documentation

## 2019-10-01 DIAGNOSIS — I130 Hypertensive heart and chronic kidney disease with heart failure and stage 1 through stage 4 chronic kidney disease, or unspecified chronic kidney disease: Secondary | ICD-10-CM | POA: Insufficient documentation

## 2019-10-01 DIAGNOSIS — E785 Hyperlipidemia, unspecified: Secondary | ICD-10-CM | POA: Diagnosis not present

## 2019-10-01 DIAGNOSIS — N400 Enlarged prostate without lower urinary tract symptoms: Secondary | ICD-10-CM | POA: Diagnosis not present

## 2019-10-01 DIAGNOSIS — D123 Benign neoplasm of transverse colon: Secondary | ICD-10-CM | POA: Insufficient documentation

## 2019-10-01 DIAGNOSIS — E1122 Type 2 diabetes mellitus with diabetic chronic kidney disease: Secondary | ICD-10-CM | POA: Insufficient documentation

## 2019-10-01 DIAGNOSIS — R110 Nausea: Secondary | ICD-10-CM | POA: Diagnosis not present

## 2019-10-01 DIAGNOSIS — Z886 Allergy status to analgesic agent status: Secondary | ICD-10-CM | POA: Insufficient documentation

## 2019-10-01 DIAGNOSIS — Z86010 Personal history of colonic polyps: Secondary | ICD-10-CM | POA: Insufficient documentation

## 2019-10-01 DIAGNOSIS — I509 Heart failure, unspecified: Secondary | ICD-10-CM | POA: Insufficient documentation

## 2019-10-01 DIAGNOSIS — F17210 Nicotine dependence, cigarettes, uncomplicated: Secondary | ICD-10-CM | POA: Insufficient documentation

## 2019-10-01 DIAGNOSIS — Z09 Encounter for follow-up examination after completed treatment for conditions other than malignant neoplasm: Secondary | ICD-10-CM

## 2019-10-01 DIAGNOSIS — Z7984 Long term (current) use of oral hypoglycemic drugs: Secondary | ICD-10-CM | POA: Insufficient documentation

## 2019-10-01 HISTORY — PX: COLONOSCOPY WITH PROPOFOL: SHX5780

## 2019-10-01 HISTORY — PX: POLYPECTOMY: SHX5525

## 2019-10-01 HISTORY — PX: ESOPHAGOGASTRODUODENOSCOPY (EGD) WITH PROPOFOL: SHX5813

## 2019-10-01 HISTORY — PX: BIOPSY: SHX5522

## 2019-10-01 LAB — GLUCOSE, CAPILLARY
Glucose-Capillary: 191 mg/dL — ABNORMAL HIGH (ref 70–99)
Glucose-Capillary: 180 mg/dL — ABNORMAL HIGH (ref 70–99)

## 2019-10-01 SURGERY — COLONOSCOPY WITH PROPOFOL
Anesthesia: General

## 2019-10-01 MED ORDER — PHENYLEPHRINE 40 MCG/ML (10ML) SYRINGE FOR IV PUSH (FOR BLOOD PRESSURE SUPPORT)
PREFILLED_SYRINGE | INTRAVENOUS | Status: AC
  Filled 2019-10-01: qty 10

## 2019-10-01 MED ORDER — PHENYLEPHRINE HCL (PRESSORS) 10 MG/ML IV SOLN
INTRAVENOUS | Status: DC | PRN
Start: 2019-10-01 — End: 2019-10-01
  Administered 2019-10-01: 80 ug via INTRAVENOUS
  Administered 2019-10-01 (×2): 40 ug via INTRAVENOUS
  Administered 2019-10-01: 80 ug via INTRAVENOUS

## 2019-10-01 MED ORDER — SODIUM CHLORIDE FLUSH 0.9 % IV SOLN
INTRAVENOUS | Status: AC
  Filled 2019-10-01: qty 10

## 2019-10-01 MED ORDER — LIDOCAINE VISCOUS HCL 2 % MT SOLN
OROMUCOSAL | Status: AC
  Filled 2019-10-01: qty 15

## 2019-10-01 MED ORDER — LIDOCAINE HCL (CARDIAC) PF 50 MG/5ML IV SOSY
PREFILLED_SYRINGE | INTRAVENOUS | Status: DC | PRN
  Administered 2019-10-01: 60 mg via INTRAVENOUS

## 2019-10-01 MED ORDER — KETAMINE HCL 10 MG/ML IJ SOLN
INTRAMUSCULAR | Status: DC | PRN
  Administered 2019-10-01: 10 mg via INTRAVENOUS

## 2019-10-01 MED ORDER — PROPOFOL 500 MG/50ML IV EMUL
INTRAVENOUS | Status: DC | PRN
  Administered 2019-10-01: 85 ug/kg/min via INTRAVENOUS
  Administered 2019-10-01: 100 ug/kg/min via INTRAVENOUS

## 2019-10-01 MED ORDER — KETAMINE HCL 50 MG/5ML IJ SOSY
PREFILLED_SYRINGE | INTRAMUSCULAR | Status: AC
  Filled 2019-10-01: qty 5

## 2019-10-01 MED ORDER — ASPIRIN 81 MG PO TBEC: 81.0000 mg | DELAYED_RELEASE_TABLET | Freq: Every day | ORAL | Status: DC

## 2019-10-01 MED ORDER — PROMETHAZINE HCL 25 MG/ML IJ SOLN
INTRAMUSCULAR | Status: AC
  Filled 2019-10-01: qty 1

## 2019-10-01 MED ORDER — LIDOCAINE VISCOUS HCL 2 % MT SOLN
15.0000 mL | Freq: Once | OROMUCOSAL | Status: AC
  Administered 2019-10-01: 15 mL via OROMUCOSAL

## 2019-10-01 MED ORDER — GLYCOPYRROLATE 0.2 MG/ML IJ SOLN
0.2000 mg | Freq: Once | INTRAMUSCULAR | Status: AC
  Administered 2019-10-01: 0.2 mg via INTRAVENOUS
  Filled 2019-10-01: qty 1

## 2019-10-01 MED ORDER — LACTATED RINGERS IV SOLN
INTRAVENOUS | Status: DC
  Administered 2019-10-01: 1000 mL via INTRAVENOUS

## 2019-10-01 MED ORDER — PROPOFOL 10 MG/ML IV BOLUS
INTRAVENOUS | Status: DC | PRN
  Administered 2019-10-01 (×3): 20 mg via INTRAVENOUS

## 2019-10-01 MED ORDER — PROMETHAZINE HCL 25 MG/ML IJ SOLN
12.5000 mg | Freq: Four times a day (QID) | INTRAMUSCULAR | Status: AC | PRN
  Administered 2019-10-01: 12.5 mg via INTRAVENOUS

## 2019-10-01 NOTE — Discharge Instructions (Signed)
Resume aspirin and clopidogrel on 10/02/2019 Resume other medications as before Resume usual diet. No driving for 24 hours. Physician will call with biopsy results.   Colonoscopy, Adult, Care After This sheet gives you information about how to care for yourself after your procedure. Your doctor may also give you more specific instructions. If you have problems or questions, call your doctor. What can I expect after the procedure? After the procedure, it is common to have:  A small amount of blood in your poop (stool) for 24 hours.  Some gas.  Mild cramping or bloating in your belly (abdomen). Follow these instructions at home: Eating and drinking   Drink enough fluid to keep your pee (urine) pale yellow.  Follow instructions from your doctor about what you cannot eat or drink.  Return to your normal diet as told by your doctor. Avoid heavy or fried foods that are hard to digest. Activity  Rest as told by your doctor.  Do not sit for a long time without moving. Get up to take short walks every 1-2 hours. This is important. Ask for help if you feel weak or unsteady.  Return to your normal activities as told by your doctor. Ask your doctor what activities are safe for you. To help cramping and bloating:   Try walking around.  Put heat on your belly as told by your doctor. Use the heat source that your doctor recommends, such as a moist heat pack or a heating pad. ? Put a towel between your skin and the heat source. ? Leave the heat on for 20-30 minutes. ? Remove the heat if your skin turns bright red. This is very important if you are unable to feel pain, heat, or cold. You may have a greater risk of getting burned. General instructions  For the first 24 hours after the procedure: ? Do not drive or use machinery. ? Do not sign important documents. ? Do not drink alcohol. ? Do your daily activities more slowly than normal. ? Eat foods that are soft and easy to  digest.  Take over-the-counter or prescription medicines only as told by your doctor.  Keep all follow-up visits as told by your doctor. This is important. Contact a doctor if:  You have blood in your poop 2-3 days after the procedure. Get help right away if:  You have more than a small amount of blood in your poop.  You see large clumps of tissue (blood clots) in your poop.  Your belly is swollen.  You feel like you may vomit (nauseous).  You vomit.  You have a fever.  You have belly pain that gets worse, and medicine does not help your pain. Summary  After the procedure, it is common to have a small amount of blood in your poop. You may also have mild cramping and bloating in your belly.  For the first 24 hours after the procedure, do not drive or use machinery, do not sign important documents, and do not drink alcohol.  Get help right away if you have a lot of blood in your poop, feel like you may vomit, have a fever, or have more belly pain. This information is not intended to replace advice given to you by your health care provider. Make sure you discuss any questions you have with your health care provider. Document Revised: 10/12/2018 Document Reviewed: 10/12/2018 Elsevier Patient Education  [place] After These instructions provide you with information  about caring for yourself after your procedure. Your health care provider may also give you more specific instructions. Your treatment has been planned according to current medical practices, but problems sometimes occur. Call your health care provider if you have any problems or questions after your procedure. What can I expect after the procedure? After your procedure, you may:  Feel sleepy for several hours.  Feel clumsy and have poor balance for several hours.  Feel forgetful about what happened after the procedure.  Have poor judgment for several hours.  Feel  nauseous or vomit.  Have a sore throat if you had a breathing tube during the procedure. Follow these instructions at home: For at least 24 hours after the procedure:      Have a responsible adult stay with you. It is important to have someone help care for you until you are awake and alert.  Rest as needed.  Do not: ? Participate in activities in which you could fall or become injured. ? Drive. ? Use heavy machinery. ? Drink alcohol. ? Take sleeping pills or medicines that cause drowsiness. ? Make important decisions or sign legal documents. ? Take care of children on your own. Eating and drinking  Follow the diet that is recommended by your health care provider.  If you vomit, drink water, juice, or soup when you can drink without vomiting.  Make sure you have little or no nausea before eating solid foods. General instructions  Take over-the-counter and prescription medicines only as told by your health care provider.  If you have sleep apnea, surgery and certain medicines can increase your risk for breathing problems. Follow instructions from your health care provider about wearing your sleep device: ? Anytime you are sleeping, including during daytime naps. ? While taking prescription pain medicines, sleeping medicines, or medicines that make you drowsy.  If you smoke, do not smoke without supervision.  Keep all follow-up visits as told by your health care provider. This is important. Contact a health care provider if:  You keep feeling nauseous or you keep vomiting.  You feel light-headed.  You develop a rash.  You have a fever. Get help right away if:  You have trouble breathing. Summary  For several hours after your procedure, you may feel sleepy and have poor judgment.  Have a responsible adult stay with you for at least 24 hours or until you are awake and alert. This information is not intended to replace advice given to you by your health care  provider. Make sure you discuss any questions you have with your health care provider. Document Revised: 06/16/2017 Document Reviewed: 07/09/2015 Elsevier Patient Education  [place].  Upper Endoscopy, Adult, Care After This sheet gives you information about how to care for yourself after your procedure. Your health care provider may also give you more specific instructions. If you have problems or questions, contact your health care provider. What can I expect after the procedure? After the procedure, it is common to have:  A sore throat.  Mild stomach pain or discomfort.  Bloating.  Nausea. Follow these instructions at home:   Follow instructions from your health care provider about what to eat or drink after your procedure.  Return to your normal activities as told by your health care provider. Ask your health care provider what activities are safe for you.  Take over-the-counter and prescription medicines only as told by your health care provider.  Do not drive for 24 hours if you were  given a sedative during your procedure.  Keep all follow-up visits as told by your health care provider. This is important. Contact a health care provider if you have:  A sore throat that lasts longer than one day.  Trouble swallowing. Get help right away if:  You vomit blood or your vomit looks like coffee grounds.  You have: ? A fever. ? Bloody, black, or tarry stools. ? A severe sore throat or you cannot swallow. ? Difficulty breathing. ? Severe pain in your chest or abdomen. Summary  After the procedure, it is common to have a sore throat, mild stomach discomfort, bloating, and nausea.  Do not drive for 24 hours if you were given a sedative during the procedure.  Follow instructions from your health care provider about what to eat or drink after your procedure.  Return to your normal activities as told by your health care provider. This information is not intended  to replace advice given to you by your health care provider. Make sure you discuss any questions you have with your health care provider. Document Revised: 09/09/2017 Document Reviewed: 08/18/2017 Elsevier Patient Education  [place].  Colon Polyps  Polyps are tissue growths inside the body. Polyps can grow in many places, including the large intestine (colon). A polyp may be a round bump or a mushroom-shaped growth. You could have one polyp or several. Most colon polyps are noncancerous (benign). However, some colon polyps can become cancerous over time. Finding and removing the polyps early can help prevent this. What are the causes? The exact cause of colon polyps is not known. What increases the risk? You are more likely to develop this condition if you:  Have a family history of colon cancer or colon polyps.  Are older than 94 or older than 45 if you are African American.  Have inflammatory bowel disease, such as ulcerative colitis or Crohn's disease.  Have certain hereditary conditions, such as: ? Familial adenomatous polyposis. ? Lynch syndrome. ? Turcot syndrome. ? Peutz-Jeghers syndrome.  Are overweight.  Smoke cigarettes.  Do not get enough exercise.  Drink too much alcohol.  Eat a diet that is high in fat and red meat and low in fiber.  Had childhood cancer that was treated with abdominal radiation. What are the signs or symptoms? Most polyps do not cause symptoms. If you have symptoms, they may include:  Blood coming from your rectum when having a bowel movement.  Blood in your stool. The stool may look dark red or black.  Abdominal pain.  A change in bowel habits, such as constipation or diarrhea. How is this diagnosed? This condition is diagnosed with a colonoscopy. This is a procedure in which a lighted, flexible scope is inserted into the anus and then passed into the colon to examine the area. Polyps are sometimes found when a colonoscopy is  done as part of routine cancer screening tests. How is this treated? Treatment for this condition involves removing any polyps that are found. Most polyps can be removed during a colonoscopy. Those polyps will then be tested for cancer. Additional treatment may be needed depending on the results of testing. Follow these instructions at home: Lifestyle  Maintain a healthy weight, or lose weight if recommended by your health care provider.  Exercise every day or as told by your health care provider.  Do not use any products that contain nicotine or tobacco, such as cigarettes and e-cigarettes. If you need help quitting, ask your health care  provider.  If you drink alcohol, limit how much you have: ? 0-1 drink a day for women. ? 0-2 drinks a day for men.  Be aware of how much alcohol is in your drink. In the [place] one drink equals one 12 oz bottle of beer (355 mL), one 5 oz glass of wine (148 mL), or one 1 oz shot of hard liquor (44 mL). Eating and drinking   Eat foods that are high in fiber, such as fruits, vegetables, and whole grains.  Eat foods that are high in calcium and vitamin D, such as milk, cheese, yogurt, eggs, liver, fish, and broccoli.  Limit foods that are high in fat, such as fried foods and desserts.  Limit the amount of red meat and processed meat you eat, such as hot dogs, sausage, bacon, and lunch meats. General instructions  Keep all follow-up visits as told by your health care provider. This is important. ? This includes having regularly scheduled colonoscopies. ? Talk to your health care provider about when you need a colonoscopy. Contact a health care provider if:  You have new or worsening bleeding during a bowel movement.  You have new or increased blood in your stool.  You have a change in bowel habits.  You lose weight for no known reason. Summary  Polyps are tissue growths inside the body. Polyps can grow in many places, including the  colon.  Most colon polyps are noncancerous (benign), but some can become cancerous over time.  This condition is diagnosed with a colonoscopy.  Treatment for this condition involves removing any polyps that are found. Most polyps can be removed during a colonoscopy. This information is not intended to replace advice given to you by your health care provider. Make sure you discuss any questions you have with your health care provider. Document Revised: 07/03/2017 Document Reviewed: 07/03/2017 Elsevier Patient Education  [place].

## 2019-10-01 NOTE — Anesthesia Postprocedure Evaluation (Signed)
Anesthesia Post Note  Patient: Derek Chen  Procedure(s) Performed: COLONOSCOPY WITH PROPOFOL (N/A ) ESOPHAGOGASTRODUODENOSCOPY (EGD) WITH PROPOFOL (N/A ) BIOPSY POLYPECTOMY  Patient location during evaluation: PACU Anesthesia Type: General Level of consciousness: awake and alert and patient cooperative Pain management: satisfactory to patient Vital Signs Assessment: post-procedure vital signs reviewed and stable Respiratory status: spontaneous breathing Cardiovascular status: stable Postop Assessment: no apparent nausea or vomiting Anesthetic complications: no   No complications documented.   Last Vitals:  Vitals:   10/01/19 0932  BP: (!) 142/82  Pulse: 87  Resp: 20  Temp: 37.1 C  SpO2: (!) 19%    Last Pain:  Vitals:   10/01/19 1025  TempSrc:   PainSc: 0-No pain                 Denham Mose

## 2019-10-01 NOTE — Op Note (Signed)
[facility] Patient Name: Derek Chen Procedure Date: 10/01/2019 10:47 AM MRN: [id number] Date of Birth: 03/08/1961 Attending MD: Hildred Laser , MD CSN: [id number] Age: 59 Admit Type: Outpatient Procedure:                Colonoscopy Indications:              High risk colon cancer surveillance: Personal                            history of colonic polyps Providers:                Hildred Laser, MD, Otis Peak B. Sharon Seller, RN, Raphael Gibney, Technician Referring MD:             Jessee Avers, FNP Medicines:                Propofol per Anesthesia Complications:            No immediate complications. Estimated Blood Loss:     Estimated blood loss was minimal. Procedure:                Pre-Anesthesia Assessment:                           - Prior to the procedure, a History and Physical                            was performed, and patient medications and                            allergies were reviewed. The patient's tolerance of                            previous anesthesia was also reviewed. The risks                            and benefits of the procedure and the sedation                            options and risks were discussed with the patient.                            All questions were answered, and informed consent                            was obtained. Prior Anticoagulants: The patient                            last took Plavix (clopidogrel) 5 days prior to the                            procedure. ASA Grade Assessment: III - A patient  with severe systemic disease. After reviewing the                            risks and benefits, the patient was deemed in                            satisfactory condition to undergo the procedure.                           After obtaining informed consent, the colonoscope                            was passed under direct vision. Throughout the                             procedure, the patient's blood pressure, pulse, and                            oxygen saturations were monitored continuously. The                            [id number]-[id number] ([id number]) was introduced through the                            anus and advanced to the the cecum, identified by                            appendiceal orifice and ileocecal valve. The                            colonoscopy was performed without difficulty. The                            patient tolerated the procedure well. The quality                            of the bowel preparation was adequate. The                            ileocecal valve, appendiceal orifice, and rectum                            were photographed. Scope In: 10:49:52 AM Scope Out: 11:10:58 AM Scope Withdrawal Time: 0 hours 19 minutes 12 seconds  Total Procedure Duration: 0 hours 21 minutes 6 seconds  Findings:      The perianal and digital rectal examinations were normal.      Six polyps were found in the rectum, splenic flexure and transverse       colon. The polyps were 4 to 10 mm in size. These polyps were removed       with a hot snare. Resection and retrieval were complete. The pathology       specimen was placed into Bottle Number 3.      The exam was otherwise normal throughout the examined colon.  Internal hemorrhoids were found during retroflexion. The hemorrhoids       were small. Impression:               - Six 4 to 10 mm polyps in the rectum, at the                            splenic flexure and in the transverse colon,                            removed with a hot snare. Resected and retrieved.                           - Internal hemorrhoids. Moderate Sedation:      Per Anesthesia Care Recommendation:           - Patient has a contact number available for                            emergencies. The signs and symptoms of potential                            delayed complications were discussed with the                             patient. Return to normal activities tomorrow.                            Written discharge instructions were provided to the                            patient.                           - Diabetic (ADA) diet today.                           - Continue present medications.                           - Resume Plavix (clopidogrel) tomorrow at prior                            dose.                           - Await pathology results.                           - Repeat colonoscopy in 3 years for surveillance. Procedure Code(s):        --- Professional ---                           [phone], Colonoscopy, flexible; with removal of                            tumor(s), polyp(s), or other lesion(s) by snare  technique Diagnosis Code(s):        --- Professional ---                           Z86.010, Personal history of colonic polyps                           K62.1, Rectal polyp                           K63.5, Polyp of colon                           K64.8, Other hemorrhoids CPT copyright 2019 American Medical Association. All rights reserved. The codes documented in this report are preliminary and upon coder review may  be revised to meet current compliance requirements. Hildred Laser, MD Hildred Laser, MD 10/01/2019 11:27:36 AM This report has been signed electronically. Number of Addenda: 0

## 2019-10-01 NOTE — Op Note (Signed)
[facility] Patient Name: Derek Chen Procedure Date: 10/01/2019 9:58 AM MRN: [id number] Date of Birth: June 29, 1960 Attending MD: Hildred Laser , MD CSN: [id number] Age: 59 Admit Type: Outpatient Procedure:                Upper GI endoscopy Indications:              Nausea, Weight loss Providers:                Hildred Laser, MD, Otis Peak B. Sharon Seller, RN, Crystal                            Page, Raphael Gibney, Technician Referring MD:             Jessee Avers, FNP Medicines:                Propofol per Anesthesia Complications:            No immediate complications. Estimated Blood Loss:     Estimated blood loss was minimal. Procedure:                Pre-Anesthesia Assessment:                           - Prior to the procedure, a History and Physical                            was performed, and patient medications and                            allergies were reviewed. The patient's tolerance of                            previous anesthesia was also reviewed. The risks                            and benefits of the procedure and the sedation                            options and risks were discussed with the patient.                            All questions were answered, and informed consent                            was obtained. Prior Anticoagulants: The patient                            last took Plavix (clopidogrel) 5 days prior to the                            procedure. ASA Grade Assessment: III - A patient                            with severe systemic disease. After reviewing the  risks and benefits, the patient was deemed in                            satisfactory condition to undergo the procedure.                           After obtaining informed consent, the endoscope was                            passed under direct vision. Throughout the                            procedure, the patient's blood pressure, pulse, and                             oxygen saturations were monitored continuously. The                            GIF-[id number] ([id number]) scope was introduced through the                            mouth, and advanced to the second part of duodenum.                            The upper GI endoscopy was accomplished without                            difficulty. The patient tolerated the procedure                            well. Scope In: 10:29:20 AM Scope Out: 10:45:15 AM Total Procedure Duration: 0 hours 15 minutes 55 seconds  Findings:      The hypopharynx was normal.      The examined esophagus was normal.      The Z-line was irregular and was found 42 cm from the incisors.      Patchy mildly erythematous mucosa without bleeding was found on the       posterior wall of the gastric body. Biopsies were taken with a cold       forceps for histology. The pathology specimen was placed into Bottle       Number 2.      The exam of the stomach was otherwise normal.      The duodenal bulb was normal.      Patchy mild inflammation characterized by congestion (edema), erosions,       erythema and friability was found in the second portion of the duodenum.       Biopsies were taken with a cold forceps for histology. The pathology       specimen was placed into Bottle Number 1. Impression:               - Normal hypopharynx.                           - Normal esophagus.                           -  Z-line irregular, 42 cm from the incisors.                           - Erythematous mucosa in the posterior wall of the                            gastric body. Biopsied.                           - Normal duodenal bulb.                           - Duodenitisinvoving 2nd part of duodenum. Biopsied. Moderate Sedation:      Per Anesthesia Care Recommendation:           - Patient has a contact number available for                            emergencies. The signs and symptoms of potential                            delayed  complications were discussed with the                            patient. Return to normal activities tomorrow.                            Written discharge instructions were provided to the                            patient.                           - Resume previous diet today.                           - Continue present medications.                           - See the other procedure note for documentation of                            additional recommendations. Procedure Code(s):        --- Professional ---                           [phone], Esophagogastroduodenoscopy, flexible,                            transoral; with biopsy, single or multiple Diagnosis Code(s):        --- Professional ---                           K22.8, Other specified diseases of esophagus                           K31.89, Other diseases of  stomach and duodenum                           K29.80, Duodenitis without bleeding                           R11.0, Nausea                           R63.4, Abnormal weight loss CPT copyright 2019 American Medical Association. All rights reserved. The codes documented in this report are preliminary and upon coder review may  be revised to meet current compliance requirements. Hildred Laser, MD Hildred Laser, MD 10/01/2019 11:22:46 AM This report has been signed electronically. Number of Addenda: 0

## 2019-10-01 NOTE — Transfer of Care (Signed)
Immediate Anesthesia Transfer of Care Note  Patient: Derek Chen  Procedure(s) Performed: COLONOSCOPY WITH PROPOFOL (N/A ) ESOPHAGOGASTRODUODENOSCOPY (EGD) WITH PROPOFOL (N/A ) BIOPSY POLYPECTOMY  Patient Location: PACU  Anesthesia Type:General  Level of Consciousness: awake, alert  and patient cooperative  Airway & Oxygen Therapy: Patient Spontanous Breathing  Post-op Assessment: Report given to RN and Post -op Vital signs reviewed and stable  Post vital signs: Reviewed and stable  Last Vitals:  Vitals Value Taken Time  BP 104/82 10/01/19 1118  Temp    Pulse 81 10/01/19 1120  Resp 14 10/01/19 1120  SpO2 99 % 10/01/19 1120  Vitals shown include unvalidated device data. SEE PACU FLOW SHEET FOR VITAL SIGNS Last Pain:  Vitals:   10/01/19 1025  TempSrc:   PainSc: 0-No pain      Patients Stated Pain Goal: 8 (37/94/32 [id number])  Complications: No complications documented.

## 2019-10-01 NOTE — H&P (Signed)
Derek Chen is an 59 y.o. male.   Chief Complaint: Patient is here for esophagogastroduodenoscopy and colonoscopy. HPI: Patient is 59 year old Caucasian male with history of COPD coronary artery disease diabetes mellitus chronic kidney disease as well as history of colonic adenomas(had multiple adenomas removed in May 2017) who presents with nausea which is chronic but has gotten worse lately.  He has not vomited recently.  Says the last time he threw up was 3 months ago.  He denies heartburn dysphagia or abdominal pain.  He states he has lost 25 pounds since January this year despite having good appetite.  His diabetic control has been suboptimal.  Last A1c was over 8.  He says he is working hard to get it under control.  He denies rectal bleeding or melena. Family history is negative for CRC.  Past Medical History:  Diagnosis Date  . Anxiety   . Arthritis   . Basal cell cancer    Chest and back - status post skin grafting  . BPH (benign prostatic hyperplasia)   . Cardiomyopathy (HCC)    LVEF 45-50%  . CHF (congestive heart failure) ([place])   . Complication of anesthesia    Difficult to wake up after anesthesia  . COPD (chronic obstructive pulmonary disease) ([place])   . Coronary artery disease    NSTEMI - DES mid LAD December 2011 (patent December 2013)  . Dyslipidemia   . Essential hypertension, benign   . GERD (gastroesophageal reflux disease)   . Hepatitis B antibody positive 2006  . History of kidney stones   . MI (myocardial infarction) ([place])    3 MI's last in 2016.  Marland Kitchen PONV (postoperative nausea and vomiting)    pt states that zofran and scope patch did not help PONV after last surgery.  . Renal insufficiency   . Sleep apnea   . Type 2 diabetes mellitus ([place]) 2012  . Viral pneumonia, unspecified 2004    Past Surgical History:  Procedure Laterality Date  . APPENDECTOMY    . COLONOSCOPY N/A 08/10/2015   Procedure: COLONOSCOPY;  Surgeon: Rogene Houston, MD;  Location[facility];  Service: Endoscopy;  Laterality: N/A;  730  . CORONARY STENT PLACEMENT    . DRUG INDUCED ENDOSCOPY N/A 11/30/2018   Procedure: DRUG INDUCED SLEEP ENDOSCOPY;  Surgeon: Melida Quitter, MD;  Location: [facility];  Service: ENT;  Laterality: N/A;  . FINGER SURGERY Right    index, gnagrene in finger.  Marland Kitchen KNEE ARTHROSCOPY     Left  . LEFT HEART CATHETERIZATION WITH CORONARY ANGIOGRAM N/A 03/20/2012   Procedure: LEFT HEART CATHETERIZATION WITH CORONARY ANGIOGRAM;  Surgeon: Thayer Headings, MD;  Location: [facility] CATH LAB;  Service: Cardiovascular;  Laterality: N/A;  . MASS EXCISION N/A 08/23/2016   Procedure: EXCISION BASAL CELL CARCINOMA ON BACK OVER 4 CM;  Surgeon: Aviva Signs, MD;  Location: [facility] ORS;  Service: General;  Laterality: N/A;  . PORT-A-CATH REMOVAL    . PORTACATH PLACEMENT    . SKIN GRAFT SPLIT THICKNESS TRUNK    01/23/2010    Right chest;  Felicie Morn, M.D.  . VASECTOMY      Family History  Problem Relation Age of Onset  . Diabetes Mother   . Deep vein thrombosis Mother   . Pulmonary embolism Mother   . Cancer Father        hodgins lymphoma  . Diabetes Sister    Social History:  reports that he has been smoking cigarettes. He  has a 17.50 pack-year smoking history. He quit smokeless tobacco use about 13 years ago.  His smokeless tobacco use included chew. He reports current alcohol use of about 2.0 - 3.0 standard drinks of alcohol per week. He reports that he does not use drugs.  Allergies:  Allergies  Allergen Reactions  . Bupropion Other (See Comments)    paralyzed  . Trazodone Palpitations and Other (See Comments)    paralyzed  . Ketorolac Diarrhea  . Acetaminophen Hives    Too much give him hives  . Advil [Ibuprofen] Itching  . Dilaudid [Hydromorphone] Nausea And Vomiting    Tolerates IV formulation--unable to tolerate pill form  . Mobic [Meloxicam] Other (See Comments)    Severe headaches.  . Naproxen Nausea Only  . Nsaids Hives    Take  to much causes hives  . Tramadol Hives and Itching  . Vicodin [Hydrocodone-Acetaminophen] Hives  . Cimetidine Other (See Comments) and Rash    headaches Severe headache  . Gabapentin Anxiety    "Neck twitches"  . Tape Rash    Paper tape    Medications Prior to Admission  Medication Sig Dispense Refill  . Acetaminophen 500 MG capsule Take 500 mg by mouth every 4 (four) hours as needed (Pain).     . albuterol (PROVENTIL HFA;VENTOLIN HFA) 108 (90 Base) MCG/ACT inhaler Inhale 1-2 puffs into the lungs daily as needed (for shortness of breath/wheezing). 1 Inhaler 0  . aspirin EC 81 MG EC tablet Take 1 tablet (81 mg total) by mouth daily.    . budesonide (PULMICORT) 0.5 MG/2ML nebulizer solution Take 0.5 mg by nebulization every 6 (six) hours as needed (Shortness of breath).     . butalbital-acetaminophen-caffeine (FIORICET) 50-325-40 MG tablet Take 3 tablets by mouth every 4 (four) hours as needed for headache.     . chlorpheniramine-HYDROcodone (TUSSIONEX) 10-8 MG/5ML SUER Take 5 mLs by mouth daily as needed for cough.     . Cinnamon 500 MG TABS Take 1,000 mg by mouth daily.    . clonazePAM (KLONOPIN) 1 MG tablet Take 1 mg by mouth 3 (three) times daily.     . clopidogrel (PLAVIX) 75 MG tablet Take 75 mg by mouth daily.    . Dulaglutide (TRULICITY) 1.5 MG/0.5ML SOPN Inject 1.5 mg as directed once a week.    . DULoxetine (CYMBALTA) 60 MG capsule Take 1 capsule (60 mg total) by mouth 2 (two) times daily. 60 capsule 0  . fluticasone (FLONASE) 50 MCG/ACT nasal spray Place 2 sprays into both nostrils daily as needed for allergies or rhinitis.     . Fluticasone Furoate (ARNUITY ELLIPTA) 100 MCG/ACT AEPB Inhale 1 puff into the lungs in the morning and at bedtime.    . glimepiride (AMARYL) 4 MG tablet Take 1 tablet (4 mg total) by mouth daily with breakfast. For diabetes 30 tablet 0  . lidocaine (XYLOCAINE) 5 % ointment Apply 1 application topically daily as needed for mild pain.     . lisinopril  (PRINIVIL,ZESTRIL) 2.5 MG tablet Take 1 tablet (2.5 mg total) by mouth daily. 30 tablet 0  . metoprolol succinate (TOPROL-XL) 50 MG 24 hr tablet Take 50 mg by mouth daily.    . nitroGLYCERIN (NITROSTAT) 0.4 MG SL tablet Place 1 tablet (0.4 mg total) under the tongue every 5 (five) minutes as needed for chest pain. 5 tablet 1  . oxyCODONE-acetaminophen (PERCOCET) 10-325 MG tablet Take 1 tablet by mouth 3 (three) times daily as needed for pain.     .   pantoprazole (PROTONIX) 40 MG tablet Take 1 tablet (40 mg total) by mouth 2 (two) times daily before a meal. 180 tablet 01  . promethazine (PHENERGAN) 25 MG tablet Take 1 tablet (25 mg total) by mouth 2 (two) times daily as needed for nausea or vomiting. for Nausea and Vomiting. 60 tablet 3  . rosuvastatin (CRESTOR) 10 MG tablet Take 1 tablet (10 mg total) by mouth daily. 30 tablet 0  . sitaGLIPtin-metformin (JANUMET) 50-1000 MG tablet Take 1 tablet by mouth 2 (two) times daily with a meal.    . tadalafil (CIALIS) 20 MG tablet Take 20 mg by mouth daily as needed for erectile dysfunction.    . tamsulosin (FLOMAX) 0.4 MG CAPS capsule Take 1 capsule (0.4 mg total) by mouth daily. 30 capsule 0  . zolpidem (AMBIEN) 10 MG tablet Take 15 mg by mouth at bedtime.    Marland Kitchen ACCU-CHEK GUIDE test strip 2 (two) times daily. check    . Accu-Chek Softclix Lancets lancets 2 (two) times daily.    . AMBULATORY NON FORMULARY MEDICATION Prostaglandin 10 mcq/ml  To bring medication to office for penile injection (Patient taking differently: Inject 10 mcg as directed See admin instructions. 4 rounds Prostaglandin 10 mcq/ml  To bring medication to office for penile injection) 1 mL 3  . Blood Glucose Monitoring Suppl (ACCU-CHEK GUIDE) w/Device KIT See admin instructions.      Results for orders placed or performed during the hospital encounter of 10/01/19 (from the past 48 hour(s))  Glucose, capillary     Status: Abnormal   Collection Time: 10/01/19  9:21 AM  Result Value Ref  Range   Glucose-Capillary 191 (H) 70 - 99 mg/dL    Comment: Glucose reference range applies only to samples taken after fasting for at least 8 hours.   No results found.  Review of Systems  Blood pressure (!) 142/82, pulse 87, temperature 98.8 F (37.1 C), temperature source Oral, resp. rate 20, SpO2 (!) 19 %. Physical Exam HENT:     Mouth/Throat:     Mouth: Mucous membranes are moist.     Pharynx: Oropharynx is clear.  Eyes:     General: No scleral icterus.    Conjunctiva/sclera: Conjunctivae normal.  Cardiovascular:     Rate and Rhythm: Normal rate and regular rhythm.     Heart sounds: Normal heart sounds. No murmur heard.   Pulmonary:     Effort: Pulmonary effort is normal.     Breath sounds: Normal breath sounds.  Abdominal:     Comments: Abdomen is flat soft and nontender with organomegaly or masses.  Musculoskeletal:        General: No swelling.     Cervical back: Neck supple.  Lymphadenopathy:     Cervical: No cervical adenopathy.  Skin:    General: Skin is warm and dry.  Neurological:     Mental Status: He is alert.  Psychiatric:        Mood and Affect: Mood normal.      Assessment/Plan Chronic nausea and weight loss. History of colonic adenomas. Diagnostic esophagogastroduodenoscopy and surveillance colonoscopy.  Hildred Laser, MD 10/01/2019, 10:19 AM

## 2019-10-01 NOTE — Anesthesia Procedure Notes (Signed)
Date/Time: 10/01/2019 10:23 AM Performed by: Vista Deck, CRNA Pre-anesthesia Checklist: Patient identified, Emergency Drugs available, Suction available, Timeout performed and Patient being monitored Patient Re-evaluated:Patient Re-evaluated prior to induction Oxygen Delivery Method: Non-rebreather mask

## 2019-10-01 NOTE — Anesthesia Preprocedure Evaluation (Signed)
Anesthesia Evaluation  Patient identified by MRN, date of birth, ID band Patient awake    Reviewed: Allergy & Precautions, H&P , NPO status , Patient's Chart, lab work & pertinent test results, reviewed documented beta blocker date and time   History of Anesthesia Complications (+) PONV and history of anesthetic complications  Airway Mallampati: II  TM Distance: >3 FB Neck ROM: full    Dental no notable dental hx. (+) Teeth Intact   Pulmonary sleep apnea , pneumonia, resolved, COPD, Current Smoker,    Pulmonary exam normal breath sounds clear to auscultation       Cardiovascular Exercise Tolerance: Good hypertension, + CAD and + Past MI   Rhythm:regular Rate:Normal     Neuro/Psych PSYCHIATRIC DISORDERS Anxiety Depression negative neurological ROS     GI/Hepatic Neg liver ROS, GERD  Medicated,  Endo/Other  negative endocrine ROSdiabetes  Renal/GU CRFRenal disease  negative genitourinary   Musculoskeletal   Abdominal   Peds  Hematology negative hematology ROS (+)   Anesthesia Other Findings   Reproductive/Obstetrics negative OB ROS                             Anesthesia Physical Anesthesia Plan  ASA: III  Anesthesia Plan: General   Post-op Pain Management:    Induction:   PONV Risk Score and Plan: 3  Airway Management Planned:   Additional Equipment:   Intra-op Plan:   Post-operative Plan:   Informed Consent: I have reviewed the patients History and Physical, chart, labs and discussed the procedure including the risks, benefits and alternatives for the proposed anesthesia with the patient or authorized representative who has indicated his/her understanding and acceptance.     Dental Advisory Given  Plan Discussed with: CRNA  Anesthesia Plan Comments:         Anesthesia Quick Evaluation

## 2019-10-05 ENCOUNTER — Other Ambulatory Visit: Payer: Self-pay

## 2019-10-05 LAB — SURGICAL PATHOLOGY

## 2019-10-06 ENCOUNTER — Ambulatory Visit (INDEPENDENT_AMBULATORY_CARE_PROVIDER_SITE_OTHER): Payer: Medicare Other | Admitting: Urology

## 2019-10-06 ENCOUNTER — Encounter: Payer: Self-pay | Admitting: Urology

## 2019-10-06 ENCOUNTER — Other Ambulatory Visit: Payer: Self-pay

## 2019-10-06 VITALS — BP 115/69 | HR 47 | Temp 97.7°F | Ht 71.0 in | Wt 158.0 lb

## 2019-10-06 DIAGNOSIS — N486 Induration penis plastica: Secondary | ICD-10-CM | POA: Diagnosis not present

## 2019-10-06 MED ORDER — COLLAGENASE CLOSTRID HISTOLYT 0.9 MG IJ SOLR
0.9000 mg | Freq: Once | INTRAMUSCULAR | Status: AC
  Administered 2019-10-06: 0.9 mg via INTRAMUSCULAR

## 2019-10-06 NOTE — Patient Instructions (Signed)
Collagenase injection (Dupuytren's Contracture/Peyronie's Disease) What is this medicine? COLLAGENASE (kohl LAH jen ace) is used to treat Dupuytren's contracture. This medicine may help straighten a bent finger by breaking up hard tissue. It is also used for Peyronie's disease by breaking up the hard tissue plaque that causes the curvature in the penis. This medicine may be used for other purposes; ask your health care provider or pharmacist if you have questions. COMMON BRAND NAME(S): Xiaflex What should I tell my health care provider before I take this medicine? They need to know if you have any of these conditions:  hemophilia  low platelet counts  take medicines that treat or prevent blood clots  an unusual or allergic reaction to collagenase, other medicines, foods, dyes, or preservatives  pregnant or trying to get pregnant  breast-feeding How should I use this medicine? This medicine is for injection into the hand or penis. It is given by a health care professional in a hospital or clinic setting. A special MedGuide will be given to you by the pharmacist with each prescription and refill. Be sure to read this information carefully each time. Talk to your pediatrician regarding the use of this medicine in children. Special care may be needed. Overdosage: If you think you have taken too much of this medicine contact a poison control center or emergency room at once. NOTE: This medicine is only for you. Do not share this medicine with others. What if I miss a dose? It is important not to miss your dose. Call your doctor or health care professional if you are unable to keep an appointment. What may interact with this medicine?  aspirin and aspirin-like medicines  certain medicines that treat or prevent blood clots like warfarin, enoxaparin, and dalteparin This list may not describe all possible interactions. Give your health care provider a list of all the medicines, herbs,  non-prescription drugs, or dietary supplements you use. Also tell them if you smoke, drink alcohol, or use illegal drugs. Some items may interact with your medicine. What should I watch for while using this medicine? Your condition will be monitored carefully while you are receiving this medicine. If being treated for Dupuytren's contracture, return to your healthcare provider the day after your hand is injected. In the meantime, do not flex or extend the fingers of your hand that was injected. Do not touch your finger that was injected, and elevate your hand until bedtime. Do not perform activity with the injected hand until you are told that it is OK. Follow any instructions about wearing a splint or performing finger exercises. Also, call your healthcare provider if you get increasing redness or swelling in the hand, if you have numbness or tingling in the treated finger, or if you have trouble bending the finger after the swelling goes down. If being treated for Peyronie's disease, you will need to return to your healthcare provider for a manual procedure that will stretch and help straighten your penis. Also, your healthcare provider will show you how to gently stretch your penis at home. Do not resume sexual activity until you are told that it is okay. Follow instructions on when to return for follow-up visits. Immediately call your doctor if you have trouble stretching or straightening your penis, or if you have pain or other concerns. Immediately call your healthcare provider if you get a fever or chills. What side effects may I notice from receiving this medicine? Side effects that you should report to your doctor or health   care professional as soon as possible:  allergic reactions like skin rash, itching or hives, swelling of the face, lips, or tongue  breathing problems  chest pain or palpitations  pain in your penis  pain when urinating  red or dark-brown urine  sudden loss of the  ability to maintain an erection  swelling of the injected hand  unusual swelling or bruising of the penis Side effects that usually do not require medical attention (report to your doctor or health care professional if they continue or are bothersome):  irritation at site where injected  pain at site where injected  unusual bleeding or bruising This list may not describe all possible side effects. Call your doctor for medical advice about side effects. You may report side effects to FDA at 1-800-FDA-1088. Where should I keep my medicine? This drug is given in a hospital or clinic and will not be stored at home. NOTE: This sheet is a summary. It may not cover all possible information. If you have questions about this medicine, talk to your doctor, pharmacist, or health care provider.  2020 Elsevier/Gold Standard (2015-04-20 09:34:13)  

## 2019-10-06 NOTE — Progress Notes (Signed)
Xiaflex injection:  Procedure:  Concentration and amount of PGE-1 injected: 1cc of 10g/CC  Approximate size and location of plaque:   Degree of curvature: 40 degree  Neo-Synephrine 150 g brought brisk detumescence.  Injection procedure:  Using sterile technique an alcohol swab was used to clean the area of injection. The location had been previously marked. A 27-gauge needle was inserted through the skin and into the plaque at the point of maximum concavity with the needle oriented perpendicular to the corpus cavernosum. The needle was advanced transversely through the width of the plaque toward the opposite side of the plaque without passing completely through it. Proper position was confirmed by noting resistance to minimal depression of the syringe plunger. I then began injecting maintaining steady pressure as I withdrew the needle slowly through the transverse width of the plaque depositing the 80mg  of Xiaflex solution 10mg  wastage. The needle was then withdrawn and gentle pressure was applied at the injection site.    He tolerated his injection well. I will therefore have him return in 48 hours for repeat injection.

## 2019-10-06 NOTE — Progress Notes (Signed)
Urological Symptom Review  Patient is experiencing the following symptoms: Frequent urination Hard to postpone urination Get up at night to urinate Trouble starting stream   Review of Systems  Gastrointestinal (upper)  : Nausea Vomiting  Gastrointestinal (lower) : Negative for lower GI symptoms  Constitutional : Night Sweats  Skin: Negative for skin symptoms  Eyes: Negative for eye symptoms  Ear/Nose/Throat : Sinus problems  Hematologic/Lymphatic: Easy bruising  Cardiovascular : Negative for cardiovascular symptoms  Respiratory : Shortness of breath  Endocrine: Negative for endocrine symptoms  Musculoskeletal: Back pain  Neurological: Headaches  Psychologic: Depression Anxiety

## 2019-10-06 NOTE — Addendum Note (Signed)
Addended by: Dorisann Frames on: 10/06/2019 02:12 PM   Modules accepted: Orders

## 2019-10-07 ENCOUNTER — Other Ambulatory Visit (INDEPENDENT_AMBULATORY_CARE_PROVIDER_SITE_OTHER): Payer: Self-pay | Admitting: *Deleted

## 2019-10-07 ENCOUNTER — Encounter: Payer: Self-pay | Admitting: Urology

## 2019-10-07 ENCOUNTER — Ambulatory Visit (INDEPENDENT_AMBULATORY_CARE_PROVIDER_SITE_OTHER): Payer: Medicare Other | Admitting: Urology

## 2019-10-07 VITALS — BP 106/73 | HR 50 | Temp 97.9°F | Ht 71.0 in | Wt 158.0 lb

## 2019-10-07 DIAGNOSIS — R634 Abnormal weight loss: Secondary | ICD-10-CM

## 2019-10-07 DIAGNOSIS — N486 Induration penis plastica: Secondary | ICD-10-CM

## 2019-10-07 MED ORDER — COLLAGENASE CLOSTRID HISTOLYT 0.9 MG IJ SOLR
0.9000 mg | Freq: Once | INTRAMUSCULAR | Status: AC
  Administered 2019-10-07: 0.9 mg via INTRAMUSCULAR

## 2019-10-07 NOTE — Progress Notes (Signed)
Urological Symptom Review  Patient is experiencing the following symptoms: Frequent urination Get up at night to urinate Trouble starting stream Erection problems (male only)   Review of Systems  Gastrointestinal (upper)  : Nausea Indigestion/heartburn  Gastrointestinal (lower) : Negative for lower GI symptoms  Constitutional : Night Sweats Weight loss  Skin: Negative for skin symptoms  Eyes: Negative for eye symptoms  Ear/Nose/Throat : Sinus problems  Hematologic/Lymphatic: Negative for Hematologic/Lymphatic symptoms  Cardiovascular : Chest pain  Respiratory : Shortness of breath  Endocrine: Negative for endocrine symptoms  Musculoskeletal: Back pain  Neurological: Headaches  Psychologic: Depression Anxiety

## 2019-10-07 NOTE — Progress Notes (Signed)
Xiaflex second injection:  Injection procedure:  Using sterile technique an alcohol swab was used to clean the area of injection. The location had been previously marked. A 27-gauge needle was inserted through the skin and into the plaque at the point of maximum concavity with the needle oriented perpendicular to the corpus cavernosum. The needle was advanced transversely through the width of the plaque toward the opposite side of the plaque without passing completely through it. Proper position was confirmed by noting resistance to minimal depression of the syringe plunger. I then began injecting maintaining steady pressure as I withdrew the needle slowly through the transverse width of the plaque depositing  0.25 cc  of Xiaflex solution 0cc wastage. The needle was then withdrawn and gentle pressure was applied at the injection site.   He tolerated his injection well. I will therefore have him return in 48 hours for penile modeling.  

## 2019-10-07 NOTE — Patient Instructions (Signed)
Collagenase injection (Dupuytren's Contracture/Peyronie's Disease) What is this medicine? COLLAGENASE (kohl LAH jen ace) is used to treat Dupuytren's contracture. This medicine may help straighten a bent finger by breaking up hard tissue. It is also used for Peyronie's disease by breaking up the hard tissue plaque that causes the curvature in the penis. This medicine may be used for other purposes; ask your health care provider or pharmacist if you have questions. COMMON BRAND NAME(S): Xiaflex What should I tell my health care provider before I take this medicine? They need to know if you have any of these conditions:  hemophilia  low platelet counts  take medicines that treat or prevent blood clots  an unusual or allergic reaction to collagenase, other medicines, foods, dyes, or preservatives  pregnant or trying to get pregnant  breast-feeding How should I use this medicine? This medicine is for injection into the hand or penis. It is given by a health care professional in a hospital or clinic setting. A special MedGuide will be given to you by the pharmacist with each prescription and refill. Be sure to read this information carefully each time. Talk to your pediatrician regarding the use of this medicine in children. Special care may be needed. Overdosage: If you think you have taken too much of this medicine contact a poison control center or emergency room at once. NOTE: This medicine is only for you. Do not share this medicine with others. What if I miss a dose? It is important not to miss your dose. Call your doctor or health care professional if you are unable to keep an appointment. What may interact with this medicine?  aspirin and aspirin-like medicines  certain medicines that treat or prevent blood clots like warfarin, enoxaparin, and dalteparin This list may not describe all possible interactions. Give your health care provider a list of all the medicines, herbs,  non-prescription drugs, or dietary supplements you use. Also tell them if you smoke, drink alcohol, or use illegal drugs. Some items may interact with your medicine. What should I watch for while using this medicine? Your condition will be monitored carefully while you are receiving this medicine. If being treated for Dupuytren's contracture, return to your healthcare provider the day after your hand is injected. In the meantime, do not flex or extend the fingers of your hand that was injected. Do not touch your finger that was injected, and elevate your hand until bedtime. Do not perform activity with the injected hand until you are told that it is OK. Follow any instructions about wearing a splint or performing finger exercises. Also, call your healthcare provider if you get increasing redness or swelling in the hand, if you have numbness or tingling in the treated finger, or if you have trouble bending the finger after the swelling goes down. If being treated for Peyronie's disease, you will need to return to your healthcare provider for a manual procedure that will stretch and help straighten your penis. Also, your healthcare provider will show you how to gently stretch your penis at home. Do not resume sexual activity until you are told that it is okay. Follow instructions on when to return for follow-up visits. Immediately call your doctor if you have trouble stretching or straightening your penis, or if you have pain or other concerns. Immediately call your healthcare provider if you get a fever or chills. What side effects may I notice from receiving this medicine? Side effects that you should report to your doctor or health   care professional as soon as possible:  allergic reactions like skin rash, itching or hives, swelling of the face, lips, or tongue  breathing problems  chest pain or palpitations  pain in your penis  pain when urinating  red or dark-brown urine  sudden loss of the  ability to maintain an erection  swelling of the injected hand  unusual swelling or bruising of the penis Side effects that usually do not require medical attention (report to your doctor or health care professional if they continue or are bothersome):  irritation at site where injected  pain at site where injected  unusual bleeding or bruising This list may not describe all possible side effects. Call your doctor for medical advice about side effects. You may report side effects to FDA at 1-800-FDA-1088. Where should I keep my medicine? This drug is given in a hospital or clinic and will not be stored at home. NOTE: This sheet is a summary. It may not cover all possible information. If you have questions about this medicine, talk to your doctor, pharmacist, or health care provider.  2020 Elsevier/Gold Standard (2015-04-20 09:34:13)  

## 2019-10-11 ENCOUNTER — Encounter (HOSPITAL_COMMUNITY): Payer: Self-pay | Admitting: Internal Medicine

## 2019-10-11 ENCOUNTER — Ambulatory Visit: Payer: Medicare Other | Admitting: Urology

## 2019-10-13 ENCOUNTER — Ambulatory Visit: Payer: Medicare Other | Admitting: Urology

## 2019-10-18 ENCOUNTER — Ambulatory Visit (HOSPITAL_COMMUNITY)
Admission: RE | Admit: 2019-10-18 | Discharge: 2019-10-18 | Disposition: A | Discharge status: Home / Self Care | Payer: Medicare Other | Source: Ambulatory Visit | Attending: Internal Medicine | Admitting: Internal Medicine

## 2019-10-18 ENCOUNTER — Ambulatory Visit: Payer: Medicare Other | Admitting: Urology

## 2019-10-18 ENCOUNTER — Other Ambulatory Visit: Payer: Self-pay

## 2019-10-18 DIAGNOSIS — R634 Abnormal weight loss: Secondary | ICD-10-CM | POA: Insufficient documentation

## 2019-10-18 MED ORDER — IOHEXOL 300 MG/ML  SOLN
100.0000 mL | Freq: Once | INTRAMUSCULAR | Status: AC | PRN
  Administered 2019-10-18: 100 mL via INTRAVENOUS

## 2019-10-25 ENCOUNTER — Telehealth (INDEPENDENT_AMBULATORY_CARE_PROVIDER_SITE_OTHER): Payer: Self-pay | Admitting: Internal Medicine

## 2019-10-25 NOTE — Telephone Encounter (Signed)
Patient left voice mail message stating he needs a follow up with you in 4 weeks - please advise

## 2019-10-26 NOTE — Telephone Encounter (Signed)
Yes please

## 2019-11-04 ENCOUNTER — Telehealth (INDEPENDENT_AMBULATORY_CARE_PROVIDER_SITE_OTHER): Payer: Self-pay | Admitting: Gastroenterology

## 2019-11-04 MED ORDER — PROMETHAZINE HCL 25 MG PO TABS: 25.0000 mg | ORAL_TABLET | Freq: Two times a day (BID) | ORAL | 0 refills | Status: DC | PRN

## 2019-11-04 NOTE — Telephone Encounter (Signed)
Patient left voice mail message stating he would like for you to send a prescription to [facility] for Phenergan - states he can't keep anything on his stomach - also has diarrhea - please advise - ph# [phone]

## 2019-11-04 NOTE — Telephone Encounter (Signed)
I sent a refill to his pharmacy but he will need to discuss any further refills with Dr. Laural Golden at his visit at the end of the month.

## 2019-11-19 ENCOUNTER — Telehealth: Payer: Self-pay | Admitting: Urology

## 2019-11-19 NOTE — Telephone Encounter (Signed)
[facility] called requesting  A call back regarding delivery of this pts medication.

## 2019-11-22 ENCOUNTER — Ambulatory Visit: Payer: Medicare Other | Admitting: Urology

## 2019-11-22 NOTE — Telephone Encounter (Signed)
Spoke with pharmacy. Meds for pt to be shipped August 30th.

## 2019-11-23 ENCOUNTER — Ambulatory Visit (INDEPENDENT_AMBULATORY_CARE_PROVIDER_SITE_OTHER): Payer: Medicare Other | Admitting: Internal Medicine

## 2019-11-23 ENCOUNTER — Encounter (INDEPENDENT_AMBULATORY_CARE_PROVIDER_SITE_OTHER): Payer: Self-pay | Admitting: Internal Medicine

## 2019-11-23 ENCOUNTER — Other Ambulatory Visit: Payer: Self-pay

## 2019-11-23 DIAGNOSIS — R112 Nausea with vomiting, unspecified: Secondary | ICD-10-CM | POA: Diagnosis not present

## 2019-11-23 DIAGNOSIS — R634 Abnormal weight loss: Secondary | ICD-10-CM | POA: Diagnosis not present

## 2019-11-23 DIAGNOSIS — K219 Gastro-esophageal reflux disease without esophagitis: Secondary | ICD-10-CM

## 2019-11-23 MED ORDER — PROMETHAZINE HCL 25 MG PO TABS: 25.0000 mg | ORAL_TABLET | Freq: Every day | ORAL | 0 refills | Status: DC | PRN

## 2019-11-23 NOTE — Progress Notes (Signed)
Presenting complaint;  Follow-up for nausea and weight loss. History of GERD.  Database and subjective:  Patient is 59 year old Caucasian male who has coronary artery disease, COPD chronic kidney disease diabetes mellitus as well as history of multiple colonic adenomas and GERD who was evaluated for nausea and weight loss.  He underwent esophagogastroduodenoscopy and colonoscopy on 10/01/2019. EGD revealed gastroduodenitis and biopsy was negative for H. pylori or changes of celiac disease. Colonoscopy revealed internal hemorrhoids and 6 polyps and these are tubular adenomas. Abdominal pelvic CT was obtained on 10/18/2019 and no abnormality was noted to account for his symptoms.  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  He had a large prostate small inguinal hernia containing fat and atherosclerosis.  Patient states he feels about the same.  He has gained 6 pounds since his last visit about 10 weeks ago.  He remains with nausea multiple times a week if not daily.  He has heaving few times a week and he does vomit once in a while.  He feels heartburn is better controlled than it has been in the past.  He may have an episode or 2/week easily controlled with Rolaids or Gaviscon.  He denies dysphagia sore throat hoarseness or chronic cough.  He says his appetite is good.  His bowels move once a day.  He denies melena or rectal bleeding.  He states he walks more than 2 miles every day.  He does not drink alcohol.  He is still smoking but has cut down to 8 cigarettes/day. He is receiving injection therapy for Peyronie's disease.  He states his helping. He has intermittent chest pain for review his nitroglycerin.  Last dose was about 4 weeks ago.  Patient says his current weight is close to his baseline.  He says he always has been skinny and that was his name when he was in high school. Review  of weight record reveals that he weighed 153 pounds on 08/10/2015 but he weighed 190 pounds on 03/03/2014 and 195 pounds in November 2015.  The last 2 weights are derived from primary provider's note.  He does not believe those weights to be accurate.  He says he has never weighed this much. He is taking pain medication for chronic back pain. He is requesting refill for promethazine.  Current Medications: Outpatient Encounter Medications as of 11/23/2019  Medication Sig  . ACCU-CHEK GUIDE test strip 2 (two) times daily. check  . Accu-Chek Softclix Lancets lancets 2 (two) times daily.  . Acetaminophen 500 MG capsule Take 500 mg by mouth every 4 (four) hours as needed (Pain).   .Marland Kitchenalbuterol (PROVENTIL HFA;VENTOLIN HFA) 108 (90 Base) MCG/ACT inhaler Inhale 1-2 puffs into the lungs daily as needed (for shortness of breath/wheezing).  . AMBULATORY NON FORMULARY MEDICATION Prostaglandin 10 mcq/ml  To bring medication to office for penile injection (Patient taking differently: Inject 10 mcg as directed See admin instructions. 4 rounds Prostaglandin 10 mcq/ml  To bring medication to office for penile injection)  . aspirin 81 MG EC tablet Take 1 tablet (81 mg total) by mouth daily.  . Blood Glucose Monitoring Suppl (ACCU-CHEK GUIDE) w/Device KIT See admin instructions.  . budesonide (PULMICORT) 0.5 MG/2ML nebulizer solution Take 0.5 mg by nebulization every 6 (six) hours as needed (Shortness of breath).   . Cinnamon 500 MG TABS Take 1,000 mg by mouth daily.  .Marland Kitchen  clonazePAM (KLONOPIN) 1 MG tablet Take 1 mg by mouth 3 (three) times daily.   . clopidogrel (PLAVIX) 75 MG tablet Take 1 tablet (75 mg total) by mouth daily.  . DULoxetine (CYMBALTA) 60 MG capsule Take 1 capsule (60 mg total) by mouth 2 (two) times daily.  . fluticasone (FLONASE) 50 MCG/ACT nasal spray Place 2 sprays into both nostrils daily as needed for allergies or rhinitis.   . Fluticasone Furoate (ARNUITY ELLIPTA) 100 MCG/ACT AEPB Inhale 1  puff into the lungs in the morning and at bedtime.  Marland Kitchen glimepiride (AMARYL) 4 MG tablet Take 1 tablet (4 mg total) by mouth daily with breakfast. For diabetes  . lidocaine (XYLOCAINE) 5 % ointment Apply 1 application topically daily as needed for mild pain.   Marland Kitchen lisinopril (PRINIVIL,ZESTRIL) 2.5 MG tablet Take 1 tablet (2.5 mg total) by mouth daily.  . metoprolol succinate (TOPROL-XL) 50 MG 24 hr tablet Take 50 mg by mouth daily.  . nitroGLYCERIN (NITROSTAT) 0.4 MG SL tablet Place 1 tablet (0.4 mg total) under the tongue every 5 (five) minutes as needed for chest pain.  Marland Kitchen oxyCODONE-acetaminophen (PERCOCET) 10-325 MG tablet Take 1 tablet by mouth 3 (three) times daily as needed for pain.   . pantoprazole (PROTONIX) 40 MG tablet Take 1 tablet (40 mg total) by mouth 2 (two) times daily before a meal.  . promethazine (PHENERGAN) 25 MG tablet Take 1 tablet (25 mg total) by mouth 2 (two) times daily as needed for nausea or vomiting. for Nausea and Vomiting.  . rosuvastatin (CRESTOR) 20 MG tablet Take 20 mg by mouth at bedtime.  . sitaGLIPtin-metformin (JANUMET) 50-1000 MG tablet Take 1 tablet by mouth 2 (two) times daily with a meal.  . tadalafil (CIALIS) 20 MG tablet Take 20 mg by mouth daily as needed for erectile dysfunction.  . tamsulosin (FLOMAX) 0.4 MG CAPS capsule Take 1 capsule (0.4 mg total) by mouth daily.  Marland Kitchen XIAFLEX 0.9 MG SOLR   . zolpidem (AMBIEN) 10 MG tablet Take 15 mg by mouth at bedtime.  . [DISCONTINUED] metFORMIN (GLUCOPHAGE) 1000 MG tablet Take 1,000 mg by mouth 2 (two) times daily.  . [DISCONTINUED] rosuvastatin (CRESTOR) 10 MG tablet Take 1 tablet (10 mg total) by mouth daily.  . butalbital-acetaminophen-caffeine (FIORICET) 50-325-40 MG tablet Take 3 tablets by mouth every 4 (four) hours as needed for headache.  (Patient not taking: Reported on 11/23/2019)  . chlorpheniramine-HYDROcodone (TUSSIONEX) 10-8 MG/5ML SUER Take 5 mLs by mouth daily as needed for cough.  (Patient not taking:  Reported on 11/23/2019)  . [DISCONTINUED] fluticasone (FLOVENT HFA) 110 MCG/ACT inhaler Inhale 2 puffs into the lungs 2 (two) times daily. Inhale 1 puff into the lungs 2 times daily.   No facility-administered encounter medications on file as of 11/23/2019.     Objective: Blood pressure 118/69, pulse 85, temperature 98.4 F (36.9 C), height '5\' 11"'  (1.803 m), weight 164 lb 4.8 oz (74.5 kg). Patient is alert and in no acute distress. Patient is wearing a mask. Conjunctiva is pink. Sclera is nonicteric Oropharyngeal mucosa is normal. No neck masses or thyromegaly noted. Cardiac exam with regular rhythm normal S1 and S2. No murmur or gallop noted. Lungs are clear to auscultation. Abdomen is symmetrical.  No bruits noted.  On palpation abdomen is soft.  He has mild midepigastric tenderness.  No organomegaly or masses. No LE edema or clubbing noted.  Labs/studies Results:   CBC Latest Ref Rng & Units 02/22/2019 04/16/2017 08/21/2016  WBC 4.0 - 10.5  K/uL 10.7(H) 10.3 10.4  Hemoglobin 13.0 - 17.0 g/dL 16.4 15.3 15.5  Hematocrit 39 - 52 % 49.3 45.7 43.5  Platelets 150 - 400 K/uL 221 247 235    CMP Latest Ref Rng & Units 09/29/2019 02/22/2019 11/26/2018  Glucose 70 - 99 mg/dL 308(H) 325(H) 203(H)  BUN 6 - 20 mg/dL '17 15 19  ' Creatinine 0.61 - 1.24 mg/dL 0.85 0.95 0.93  Sodium 135 - 145 mmol/L 137 134(L) 134(L)  Potassium 3.5 - 5.1 mmol/L 4.1 5.0 4.8  Chloride 98 - 111 mmol/L 103 103 102  CO2 22 - 32 mmol/L 22 22 21(L)  Calcium 8.9 - 10.3 mg/dL 9.1 9.4 9.2  Total Protein 6.5 - 8.1 g/dL - - -  Total Bilirubin 0.3 - 1.2 mg/dL - - -  Alkaline Phos 38 - 126 U/L - - -  AST 15 - 41 U/L - - -  ALT 17 - 63 U/L - - -      Assessment:  #1.  Chronic nausea with heaving and sporadic vomiting.  Recent EGD was negative for H. pylori gastritis or celiac disease.  He also did not have peptic ulcer disease.  I suspect his nausea is secondary to narcotic therapy.  Since he is very symptomatic will  proceed with gastric emptying study to rule out gastroparesis.  #2.  Weight loss.  He has gained 6 pounds in the last 10 weeks or so.  Are diminished oral intake because of nausea.  He may be close to his recent baseline weight although he weighed 190 pounds back in December 2015.  Weight loss may be related to his diabetes mellitus.  We will continue to monitor his weight.  #3.  Chronic GERD.  Recent EGD was negative for erosive esophagitis or Barrett's esophagus.  He is requiring double dose PPI.  #4.  History of multiple colonic adenomas.  Next colonoscopy due in July 2024.  Plan:  Continue pantoprazole at current dose of 40 mg p.o. twice daily. Proceed with solid-phase gastric emptying study. Promethazine 25 mg daily as needed.  Prescription given for 20 doses without refill. Weight check in 2 months. Office visit in 6 months.

## 2019-11-23 NOTE — Patient Instructions (Signed)
Keep a written record as to heaving and vomiting episodes Physician will call with results of gastric emptying study when completed. Recheck in 2 months. Promethazine only when absolutely necessary.

## 2019-11-24 ENCOUNTER — Ambulatory Visit: Payer: Medicare Other | Admitting: Urology

## 2019-11-25 ENCOUNTER — Ambulatory Visit (INDEPENDENT_AMBULATORY_CARE_PROVIDER_SITE_OTHER): Payer: Medicare Other | Admitting: Internal Medicine

## 2019-12-01 ENCOUNTER — Encounter (HOSPITAL_COMMUNITY): Payer: Medicare Other

## 2019-12-22 ENCOUNTER — Encounter: Payer: Self-pay | Admitting: Urology

## 2019-12-22 ENCOUNTER — Other Ambulatory Visit: Payer: Self-pay

## 2019-12-22 ENCOUNTER — Ambulatory Visit (INDEPENDENT_AMBULATORY_CARE_PROVIDER_SITE_OTHER): Payer: Medicare Other | Admitting: Urology

## 2019-12-22 VITALS — BP 143/87 | HR 78 | Temp 98.6°F | Ht 71.0 in | Wt 164.0 lb

## 2019-12-22 DIAGNOSIS — N486 Induration penis plastica: Secondary | ICD-10-CM | POA: Diagnosis not present

## 2019-12-22 MED ORDER — COLLAGENASE CLOSTRID HISTOLYT 0.9 MG IJ SOLR
0.9000 mg | Freq: Once | INTRAMUSCULAR | Status: AC
  Administered 2019-12-22: 0.9 mg via INTRAMUSCULAR

## 2019-12-22 MED ORDER — PHENYLEPHRINE HCL (PRESSORS) 10 MG/ML IV SOLN
0.1000 mg | Freq: Once | INTRAVENOUS | Status: AC
  Administered 2019-12-22: 0.1 mg via SUBCUTANEOUS

## 2019-12-22 NOTE — Progress Notes (Signed)
Xiaflex injection:  Procedure:  Concentration and amount of PGE-1 injected: 1cc of 10g/CC  Approximate size and location of plaque: mid penile shaft, dorsal   Degree of curvature: 30 degrees  Neo-Synephrine 150 g brought brisk detumescence.  Injection procedure:  Using sterile technique an alcohol swab was used to clean the area of injection. The location had been previously marked. A 27-gauge needle was inserted through the skin and into the plaque at the point of maximum concavity with the needle oriented perpendicular to the corpus cavernosum. The needle was advanced transversely through the width of the plaque toward the opposite side of the plaque without passing completely through it. Proper position was confirmed by noting resistance to minimal depression of the syringe plunger. I then began injecting maintaining steady pressure as I withdrew the needle slowly through the transverse width of the plaque depositing the 80mg  of Xiaflex solution 10mg  wastage. The needle was then withdrawn and gentle pressure was applied at the injection site.    He tolerated his injection well. I will therefore have him return in 48 hours for repeat injection.

## 2019-12-22 NOTE — Progress Notes (Signed)

## 2019-12-23 ENCOUNTER — Encounter: Payer: Self-pay | Admitting: Urology

## 2019-12-23 NOTE — Patient Instructions (Signed)
Collagenase injection (Dupuytren's Contracture/Peyronie's Disease) What is this medicine? COLLAGENASE (kohl LAH jen ace) is used to treat Dupuytren's contracture. This medicine may help straighten a bent finger by breaking up hard tissue. It is also used for Peyronie's disease by breaking up the hard tissue plaque that causes the curvature in the penis. This medicine may be used for other purposes; ask your health care provider or pharmacist if you have questions. COMMON BRAND NAME(S): Xiaflex What should I tell my health care provider before I take this medicine? They need to know if you have any of these conditions:  hemophilia  low platelet counts  take medicines that treat or prevent blood clots  an unusual or allergic reaction to collagenase, other medicines, foods, dyes, or preservatives  pregnant or trying to get pregnant  breast-feeding How should I use this medicine? This medicine is for injection into the hand or penis. It is given by a health care professional in a hospital or clinic setting. A special MedGuide will be given to you by the pharmacist with each prescription and refill. Be sure to read this information carefully each time. Talk to your pediatrician regarding the use of this medicine in children. Special care may be needed. Overdosage: If you think you have taken too much of this medicine contact a poison control center or emergency room at once. NOTE: This medicine is only for you. Do not share this medicine with others. What if I miss a dose? It is important not to miss your dose. Call your doctor or health care professional if you are unable to keep an appointment. What may interact with this medicine?  aspirin and aspirin-like medicines  certain medicines that treat or prevent blood clots like warfarin, enoxaparin, and dalteparin This list may not describe all possible interactions. Give your health care provider a list of all the medicines, herbs,  non-prescription drugs, or dietary supplements you use. Also tell them if you smoke, drink alcohol, or use illegal drugs. Some items may interact with your medicine. What should I watch for while using this medicine? Your condition will be monitored carefully while you are receiving this medicine. If being treated for Dupuytren's contracture, return to your healthcare provider the day after your hand is injected. In the meantime, do not flex or extend the fingers of your hand that was injected. Do not touch your finger that was injected, and elevate your hand until bedtime. Do not perform activity with the injected hand until you are told that it is OK. Follow any instructions about wearing a splint or performing finger exercises. Also, call your healthcare provider if you get increasing redness or swelling in the hand, if you have numbness or tingling in the treated finger, or if you have trouble bending the finger after the swelling goes down. If being treated for Peyronie's disease, you will need to return to your healthcare provider for a manual procedure that will stretch and help straighten your penis. Also, your healthcare provider will show you how to gently stretch your penis at home. Do not resume sexual activity until you are told that it is okay. Follow instructions on when to return for follow-up visits. Immediately call your doctor if you have trouble stretching or straightening your penis, or if you have pain or other concerns. Immediately call your healthcare provider if you get a fever or chills. What side effects may I notice from receiving this medicine? Side effects that you should report to your doctor or health   care professional as soon as possible:  allergic reactions like skin rash, itching or hives, swelling of the face, lips, or tongue  breathing problems  chest pain or palpitations  pain in your penis  pain when urinating  red or dark-brown urine  sudden loss of the  ability to maintain an erection  swelling of the injected hand  unusual swelling or bruising of the penis Side effects that usually do not require medical attention (report to your doctor or health care professional if they continue or are bothersome):  irritation at site where injected  pain at site where injected  unusual bleeding or bruising This list may not describe all possible side effects. Call your doctor for medical advice about side effects. You may report side effects to FDA at 1-800-FDA-1088. Where should I keep my medicine? This drug is given in a hospital or clinic and will not be stored at home. NOTE: This sheet is a summary. It may not cover all possible information. If you have questions about this medicine, talk to your doctor, pharmacist, or health care provider.  2020 Elsevier/Gold Standard (2015-04-20 09:34:13)  

## 2019-12-24 ENCOUNTER — Ambulatory Visit: Payer: Medicare Other | Admitting: Urology

## 2019-12-24 ENCOUNTER — Ambulatory Visit (INDEPENDENT_AMBULATORY_CARE_PROVIDER_SITE_OTHER): Payer: Medicare Other | Admitting: Urology

## 2019-12-24 ENCOUNTER — Other Ambulatory Visit: Payer: Self-pay

## 2019-12-24 VITALS — BP 135/58 | HR 48 | Temp 98.0°F | Ht 71.0 in | Wt 164.0 lb

## 2019-12-24 DIAGNOSIS — N486 Induration penis plastica: Secondary | ICD-10-CM

## 2019-12-24 MED ORDER — COLLAGENASE CLOSTRID HISTOLYT 0.9 MG IJ SOLR
0.9000 mg | Freq: Once | INTRAMUSCULAR | Status: AC
  Administered 2019-12-24: 0.9 mg via INTRAMUSCULAR

## 2019-12-27 ENCOUNTER — Encounter: Payer: Self-pay | Admitting: Urology

## 2019-12-27 NOTE — Patient Instructions (Signed)
Collagenase injection (Dupuytren's Contracture/Peyronie's Disease) What is this medicine? COLLAGENASE (kohl LAH jen ace) is used to treat Dupuytren's contracture. This medicine may help straighten a bent finger by breaking up hard tissue. It is also used for Peyronie's disease by breaking up the hard tissue plaque that causes the curvature in the penis. This medicine may be used for other purposes; ask your health care provider or pharmacist if you have questions. COMMON BRAND NAME(S): Xiaflex What should I tell my health care provider before I take this medicine? They need to know if you have any of these conditions:  hemophilia  low platelet counts  take medicines that treat or prevent blood clots  an unusual or allergic reaction to collagenase, other medicines, foods, dyes, or preservatives  pregnant or trying to get pregnant  breast-feeding How should I use this medicine? This medicine is for injection into the hand or penis. It is given by a health care professional in a hospital or clinic setting. A special MedGuide will be given to you by the pharmacist with each prescription and refill. Be sure to read this information carefully each time. Talk to your pediatrician regarding the use of this medicine in children. Special care may be needed. Overdosage: If you think you have taken too much of this medicine contact a poison control center or emergency room at once. NOTE: This medicine is only for you. Do not share this medicine with others. What if I miss a dose? It is important not to miss your dose. Call your doctor or health care professional if you are unable to keep an appointment. What may interact with this medicine?  aspirin and aspirin-like medicines  certain medicines that treat or prevent blood clots like warfarin, enoxaparin, and dalteparin This list may not describe all possible interactions. Give your health care provider a list of all the medicines, herbs,  non-prescription drugs, or dietary supplements you use. Also tell them if you smoke, drink alcohol, or use illegal drugs. Some items may interact with your medicine. What should I watch for while using this medicine? Your condition will be monitored carefully while you are receiving this medicine. If being treated for Dupuytren's contracture, return to your healthcare provider the day after your hand is injected. In the meantime, do not flex or extend the fingers of your hand that was injected. Do not touch your finger that was injected, and elevate your hand until bedtime. Do not perform activity with the injected hand until you are told that it is OK. Follow any instructions about wearing a splint or performing finger exercises. Also, call your healthcare provider if you get increasing redness or swelling in the hand, if you have numbness or tingling in the treated finger, or if you have trouble bending the finger after the swelling goes down. If being treated for Peyronie's disease, you will need to return to your healthcare provider for a manual procedure that will stretch and help straighten your penis. Also, your healthcare provider will show you how to gently stretch your penis at home. Do not resume sexual activity until you are told that it is okay. Follow instructions on when to return for follow-up visits. Immediately call your doctor if you have trouble stretching or straightening your penis, or if you have pain or other concerns. Immediately call your healthcare provider if you get a fever or chills. What side effects may I notice from receiving this medicine? Side effects that you should report to your doctor or health   care professional as soon as possible:  allergic reactions like skin rash, itching or hives, swelling of the face, lips, or tongue  breathing problems  chest pain or palpitations  pain in your penis  pain when urinating  red or dark-brown urine  sudden loss of the  ability to maintain an erection  swelling of the injected hand  unusual swelling or bruising of the penis Side effects that usually do not require medical attention (report to your doctor or health care professional if they continue or are bothersome):  irritation at site where injected  pain at site where injected  unusual bleeding or bruising This list may not describe all possible side effects. Call your doctor for medical advice about side effects. You may report side effects to FDA at 1-800-FDA-1088. Where should I keep my medicine? This drug is given in a hospital or clinic and will not be stored at home. NOTE: This sheet is a summary. It may not cover all possible information. If you have questions about this medicine, talk to your doctor, pharmacist, or health care provider.  2020 Elsevier/Gold Standard (2015-04-20 09:34:13)  

## 2019-12-27 NOTE — Progress Notes (Signed)
Xiaflex second injection:  Injection procedure:  Using sterile technique an alcohol swab was used to clean the area of injection. The location had been previously marked. A 27-gauge needle was inserted through the skin and into the plaque at the point of maximum concavity with the needle oriented perpendicular to the corpus cavernosum. The needle was advanced transversely through the width of the plaque toward the opposite side of the plaque without passing completely through it. Proper position was confirmed by noting resistance to minimal depression of the syringe plunger. I then began injecting maintaining steady pressure as I withdrew the needle slowly through the transverse width of the plaque depositing  0.25 cc  of Xiaflex solution 0cc wastage. The needle was then withdrawn and gentle pressure was applied at the injection site.   He tolerated his injection well. I will therefore have him return in 48 hours for penile modeling.  

## 2020-01-04 ENCOUNTER — Encounter (HOSPITAL_COMMUNITY): Payer: Medicare Other

## 2020-01-06 ENCOUNTER — Telehealth (INDEPENDENT_AMBULATORY_CARE_PROVIDER_SITE_OTHER): Payer: Self-pay | Admitting: Gastroenterology

## 2020-01-06 NOTE — Telephone Encounter (Signed)
Will be addressed as soon as we hear back from the patient.

## 2020-01-06 NOTE — Telephone Encounter (Signed)
He can you find out how often he is taking Phenergan and I will discuss with Dr. Laural Golden.  It also looks like gastric emptying scan Dr. Laural Golden recommended at last office visit has not been completed yet-is he planning to have this done?

## 2020-01-06 NOTE — Telephone Encounter (Signed)
Patient was called and the call went to voicemail. The 2 questions were ask, and we also ask that the patient call the office back with answers to those questions. 1- How many Phenergan is he taking a day. 2- Has he had the Gastric Emptying Study done that was ordered at his last visit with Dr.Rehman.

## 2020-01-06 NOTE — Telephone Encounter (Signed)
Patient left message regarding refill request - please advise - ph# [phone]

## 2020-01-06 NOTE — Telephone Encounter (Signed)
I talked with the patient and he is requesting a refill for his Phenergan.

## 2020-01-07 MED ORDER — PROMETHAZINE HCL 25 MG PO TABS: 25.0000 mg | ORAL_TABLET | Freq: Two times a day (BID) | ORAL | 0 refills | Status: DC | PRN

## 2020-01-07 NOTE — Addendum Note (Signed)
Addended by: Laurine Blazer A on: 01/07/2020 11:32 AM   Modules accepted: Orders

## 2020-01-07 NOTE — Telephone Encounter (Signed)
1 month supply of phenergan sent to pharmacy, can reschedule GES when able. Thanks.

## 2020-01-07 NOTE — Telephone Encounter (Signed)
Patient called and left a voice message with the following information. In reference to the test that [facility] ordered , an appointment was made and the patient states that he got a call and was told that the medication was out of stock and was being ordered.  Then his son contracted COVID , and Ronalee Belts was in quarantine for 14 days and he comments that he is still in quarantine. He will call and arrange for the test to be rescheduled as soon as he can.   As for the Phenergan he states that he is taking 1 by mouth every day or every 2 days on an as needed basis.  He would appreciate hearing back from [name] and that his pharmacy will deliver.

## 2020-01-10 NOTE — Telephone Encounter (Signed)
Noted. Patient had indicated that he would call our office when he was done with quarantine, as he has been exposed to [place].

## 2020-01-23 ENCOUNTER — Other Ambulatory Visit (INDEPENDENT_AMBULATORY_CARE_PROVIDER_SITE_OTHER): Payer: Self-pay | Admitting: Gastroenterology

## 2020-02-07 ENCOUNTER — Ambulatory Visit: Payer: Medicare Other | Admitting: Urology

## 2020-02-09 ENCOUNTER — Ambulatory Visit: Payer: Medicare Other | Admitting: Urology

## 2020-02-18 ENCOUNTER — Other Ambulatory Visit (INDEPENDENT_AMBULATORY_CARE_PROVIDER_SITE_OTHER): Payer: Self-pay | Admitting: Gastroenterology

## 2020-02-21 NOTE — Telephone Encounter (Signed)
Last seen 11/23/2019 [facility]

## 2020-02-29 ENCOUNTER — Ambulatory Visit: Payer: Medicare Other | Admitting: Urology

## 2020-03-01 ENCOUNTER — Ambulatory Visit: Payer: Medicare Other | Admitting: Urology

## 2020-03-03 ENCOUNTER — Ambulatory Visit: Payer: Medicare Other | Admitting: Urology

## 2020-03-23 ENCOUNTER — Telehealth (INDEPENDENT_AMBULATORY_CARE_PROVIDER_SITE_OTHER): Payer: Self-pay | Admitting: Gastroenterology

## 2020-03-23 ENCOUNTER — Other Ambulatory Visit (INDEPENDENT_AMBULATORY_CARE_PROVIDER_SITE_OTHER): Payer: Self-pay

## 2020-03-23 DIAGNOSIS — R112 Nausea with vomiting, unspecified: Secondary | ICD-10-CM

## 2020-03-23 MED ORDER — PROMETHAZINE HCL 25 MG PO TABS: ORAL_TABLET | ORAL | 0 refills | Status: DC

## 2020-03-23 NOTE — Telephone Encounter (Signed)
Patient called regarding a medication refill - stated his pharmacy [facility] has faxed - please advise - ph# [phone]

## 2020-03-23 NOTE — Telephone Encounter (Signed)
Medication sent to [facility] as patient requested.  I spoke with the patient and made aware we did not received any refills from [facility], but would send in the phenergan for him.

## 2020-04-18 ENCOUNTER — Other Ambulatory Visit (INDEPENDENT_AMBULATORY_CARE_PROVIDER_SITE_OTHER): Payer: Self-pay | Admitting: Gastroenterology

## 2020-04-18 DIAGNOSIS — R112 Nausea with vomiting, unspecified: Secondary | ICD-10-CM

## 2020-04-19 ENCOUNTER — Ambulatory Visit: Payer: Medicare Other | Admitting: Urology

## 2020-04-19 NOTE — Telephone Encounter (Signed)
Last seen 11/23/2019 for [place] by Dr. Laural Golden.

## 2020-04-21 ENCOUNTER — Ambulatory Visit: Payer: Medicare Other | Admitting: Urology

## 2020-05-17 ENCOUNTER — Other Ambulatory Visit (INDEPENDENT_AMBULATORY_CARE_PROVIDER_SITE_OTHER): Payer: Self-pay

## 2020-05-17 DIAGNOSIS — R112 Nausea with vomiting, unspecified: Secondary | ICD-10-CM

## 2020-05-17 MED ORDER — PROMETHAZINE HCL 25 MG PO TABS: ORAL_TABLET | ORAL | 0 refills | Status: DC

## 2020-05-17 NOTE — Telephone Encounter (Signed)
Will need follow up appointment for furhter refills, will give script for a month

## 2020-05-25 ENCOUNTER — Other Ambulatory Visit: Payer: Self-pay

## 2020-05-25 ENCOUNTER — Encounter (INDEPENDENT_AMBULATORY_CARE_PROVIDER_SITE_OTHER): Payer: Self-pay | Admitting: Gastroenterology

## 2020-05-25 ENCOUNTER — Ambulatory Visit (INDEPENDENT_AMBULATORY_CARE_PROVIDER_SITE_OTHER): Payer: Medicare Other | Admitting: Gastroenterology

## 2020-05-25 VITALS — BP 144/83 | HR 80 | Temp 98.0°F | Ht 71.0 in | Wt 161.0 lb

## 2020-05-25 DIAGNOSIS — Z79891 Long term (current) use of opiate analgesic: Secondary | ICD-10-CM

## 2020-05-25 DIAGNOSIS — R112 Nausea with vomiting, unspecified: Secondary | ICD-10-CM

## 2020-05-25 DIAGNOSIS — K649 Unspecified hemorrhoids: Secondary | ICD-10-CM | POA: Insufficient documentation

## 2020-05-25 MED ORDER — PROMETHAZINE HCL 25 MG PO TABS: ORAL_TABLET | ORAL | 3 refills | Status: DC

## 2020-05-25 NOTE — Progress Notes (Signed)
[name], M.D. Gastroenterology & Hepatology [facility] [address] [place], [place] [address]  Primary Care Physician: Emelia Loron, NP [place] [address]  I will communicate my assessment and recommendations to the referring MD via EMR.  Problems: 1. Chronic nausea and vomiting 2. Chronic intake of opiates 3. Hemorrhoids  History of Present Illness: TAYVIEN KANE is a 60 y.o. male Caucasian male who has coronary artery disease, COPD chronic kidney disease diabetes mellitus, GERD, anxiety, BPH, cardiomyopathy, coronary artery disease status post stent placement, who presents for follow up of chronic nausea and vomiting  The patient was last seen on 11/23/2019. At that time, the patient was continued on omeprazole 40 mg twice a day for GERD.  He was ordered to have ligation in the study but he did not schedule this.  He also received his refills of promethazine 25 mg as needed.   Patient states that he takes the Phenergan one or two times a day. He has tolerated it well and has improved his symptoms adequately.  He is asking for refills of this medication as it has been the only antiemetic that has helped him relieve his symptoms. He tries to avoid fried food as it may worsen his nausea.  He is having regular movements on a daily basis, at least two a day.  Patient reports that when he has a bowel movement he has a gnawing discomfort in his rectum after having a bowel movement.  He has occasional rectal bleeding episodes.  He would like to know if there is any intervention that could be performed for his hemorrhoids.  The patient denies having any nausea, vomiting, fever, chills, hematochezia, melena, hematemesis, abdominal distention, abdominal pain, diarrhea, jaundice, pruritus or weight loss.  Last EGD: 2021, patchy erythema in the posterior wall of the gastric body, duodenitis.  Gastric biopsies  were positive for reactive changes and chronic inflammation but negative for H. pylori or dysplasia.  Biopsies of the duodenum were normal. Last Colonoscopy: 2021 - Six 4 to 10 mm polyps in the rectum, at the splenic flexure and in the transverse colon,removed with a hot snare.  Pathology consistent with tubular adenomas. Internal hemorrhoids. Advised to repeat colonoscopy in 3 years.  Past Medical History: Past Medical History:  Diagnosis Date  . Anxiety   . Arthritis   . Basal cell cancer    Chest and back - status post skin grafting  . BPH (benign prostatic hyperplasia)   . Cardiomyopathy (HCC)    LVEF 45-50%  . CHF (congestive heart failure) ([place])   . Complication of anesthesia    Difficult to wake up after anesthesia  . COPD (chronic obstructive pulmonary disease) ([place])   . Coronary artery disease    NSTEMI - DES mid LAD December 2011 (patent December 2013)  . Dyslipidemia   . Essential hypertension, benign   . GERD (gastroesophageal reflux disease)   . Hepatitis B antibody positive 2006  . History of kidney stones   . MI (myocardial infarction) ([place])    3 MI's last in 2016.  Marland Kitchen PONV (postoperative nausea and vomiting)    pt states that zofran and scope patch did not help PONV after last surgery.  . Renal insufficiency   . Sleep apnea   . Type 2 diabetes mellitus ([place]) 2012  . Viral pneumonia, unspecified 2004    Past Surgical History: Past Surgical History:  Procedure Laterality Date  . APPENDECTOMY    .  BIOPSY  10/01/2019   Procedure: BIOPSY;  Surgeon: Rogene Houston, MD;  [facility];  Service: Endoscopy;;  duodenum gastric  . COLONOSCOPY N/A 08/10/2015   Procedure: COLONOSCOPY;  Surgeon: Rogene Houston, MD;  [facility];  Service: Endoscopy;  Laterality: N/A;  730  . COLONOSCOPY WITH PROPOFOL N/A 10/01/2019   Procedure: COLONOSCOPY WITH PROPOFOL;  Surgeon: Rogene Houston, MD;  [facility];  Service: Endoscopy;  Laterality: N/A;   1105  . CORONARY STENT PLACEMENT    . DRUG INDUCED ENDOSCOPY N/A 11/30/2018   Procedure: DRUG INDUCED SLEEP ENDOSCOPY;  Surgeon: Melida Quitter, MD;  Location: [facility];  Service: ENT;  Laterality: N/A;  . ESOPHAGOGASTRODUODENOSCOPY (EGD) WITH PROPOFOL N/A 10/01/2019   Procedure: ESOPHAGOGASTRODUODENOSCOPY (EGD) WITH PROPOFOL;  Surgeon: Rogene Houston, MD;  [facility];  Service: Endoscopy;  Laterality: N/A;  . FINGER SURGERY Right    index, gnagrene in finger.  Marland Kitchen KNEE ARTHROSCOPY     Left  . LEFT HEART CATHETERIZATION WITH CORONARY ANGIOGRAM N/A 03/20/2012   Procedure: LEFT HEART CATHETERIZATION WITH CORONARY ANGIOGRAM;  Surgeon: Thayer Headings, MD;  Location: [facility] CATH LAB;  Service: Cardiovascular;  Laterality: N/A;  . MASS EXCISION N/A 08/23/2016   Procedure: EXCISION BASAL CELL CARCINOMA ON BACK OVER 4 CM;  Surgeon: Aviva Signs, MD;  [facility]: [facility];  Service: General;  Laterality: N/A;  . POLYPECTOMY  10/01/2019   Procedure: POLYPECTOMY;  Surgeon: Rogene Houston, MD;  [facility];  Service: Endoscopy;;  colon  . PORT-A-CATH REMOVAL    . PORTACATH PLACEMENT    . SKIN GRAFT SPLIT THICKNESS TRUNK    01/23/2010    Right chest;  Felicie Morn, M.D.  . VASECTOMY      Family History: Family History  Problem Relation Age of Onset  . Diabetes Mother   . Deep vein thrombosis Mother   . Pulmonary embolism Mother   . Cancer Father        hodgins lymphoma  . Diabetes Sister     Social History: Social History   Tobacco Use  Smoking Status Current Some Day Smoker  . Packs/day: 0.50  . Years: 35.00  . Pack years: 17.50  . Types: Cigarettes  Smokeless Tobacco Former Systems developer  . Types: Chew  . Quit date: 08/22/2006   Social History   Substance and Sexual Activity  Alcohol Use Yes  . Alcohol/week: 2.0 - 3.0 standard drinks  . Types: 2 - 3 Standard drinks or equivalent per week   Comment: Drinks occasional beer and wine   Social History    Substance and Sexual Activity  Drug Use No    Allergies: Allergies  Allergen Reactions  . Bupropion Other (See Comments)    paralyzed  . Trazodone Palpitations and Other (See Comments)    paralyzed  . Ketorolac Diarrhea  . Acetaminophen Hives    Too much give him hives  . Dilaudid [Hydromorphone] Nausea And Vomiting    Tolerates IV formulation--unable to tolerate pill form  . Ibuprofen Itching and Other (See Comments)  . Meloxicam Other (See Comments)    Severe headaches.  . Naproxen Nausea Only  . Nsaids Hives    Take to much causes hives  . Other Diarrhea  . Tramadol Hives and Itching  . Vicodin [Hydrocodone-Acetaminophen] Hives  . Cimetidine Other (See Comments) and Rash    headaches Severe headache  . Gabapentin Anxiety    "Neck twitches"  .  Tape Rash    Paper tape    Medications: Current Outpatient Medications  Medication Sig Dispense Refill  . ACCU-CHEK GUIDE test strip 2 (two) times daily. check    . Accu-Chek Softclix Lancets lancets 2 (two) times daily.    . Acetaminophen 500 MG capsule Take 500 mg by mouth every 4 (four) hours as needed (Pain).     Marland Kitchen albuterol (PROVENTIL HFA;VENTOLIN HFA) 108 (90 Base) MCG/ACT inhaler Inhale 1-2 puffs into the lungs daily as needed (for shortness of breath/wheezing). 1 Inhaler 0  . aspirin 81 MG EC tablet Take 1 tablet (81 mg total) by mouth daily.    . Blood Glucose Monitoring Suppl (ACCU-CHEK GUIDE) w/Device KIT See admin instructions.    . budesonide (PULMICORT) 0.5 MG/2ML nebulizer solution Take 0.5 mg by nebulization every 6 (six) hours as needed (Shortness of breath).     . butalbital-acetaminophen-caffeine (FIORICET) 50-325-40 MG tablet Take 3 tablets by mouth every 4 (four) hours as needed for headache.     . chlorpheniramine-HYDROcodone (TUSSIONEX) 10-8 MG/5ML SUER Take 5 mLs by mouth daily as needed for cough.     . Cinnamon 500 MG TABS Take 1,000 mg by mouth daily.    . clonazePAM (KLONOPIN) 1 MG tablet Take 1  mg by mouth 3 (three) times daily.     . clopidogrel (PLAVIX) 75 MG tablet Take 1 tablet (75 mg total) by mouth daily.    . DULoxetine (CYMBALTA) 60 MG capsule Take 1 capsule (60 mg total) by mouth 2 (two) times daily. 60 capsule 0  . fluticasone (FLONASE) 50 MCG/ACT nasal spray Place 2 sprays into both nostrils daily as needed for allergies or rhinitis.     . Fluticasone Furoate (ARNUITY ELLIPTA) 100 MCG/ACT AEPB Inhale 1 puff into the lungs in the morning and at bedtime.    Marland Kitchen glimepiride (AMARYL) 4 MG tablet Take 1 tablet (4 mg total) by mouth daily with breakfast. For diabetes 30 tablet 0  . lidocaine (XYLOCAINE) 5 % ointment Apply 1 application topically daily as needed for mild pain.     Marland Kitchen lisinopril (PRINIVIL,ZESTRIL) 2.5 MG tablet Take 1 tablet (2.5 mg total) by mouth daily. 30 tablet 0  . metoprolol succinate (TOPROL-XL) 50 MG 24 hr tablet Take 50 mg by mouth daily.    . nitroGLYCERIN (NITROSTAT) 0.4 MG SL tablet Place 1 tablet (0.4 mg total) under the tongue every 5 (five) minutes as needed for chest pain. 5 tablet 1  . pantoprazole (PROTONIX) 40 MG tablet TAKE 1 TABLET BY MOUTH  TWICE DAILY BEFORE A MEAL 180 tablet 1  . promethazine (PHENERGAN) 25 MG tablet TAKE 1 TABLET BY MOUTH TWICE DAILY AS NEEDED FOR NAUSEA AND FOR VOMITING 60 tablet 0  . rosuvastatin (CRESTOR) 20 MG tablet Take 20 mg by mouth at bedtime.    . sitaGLIPtin-metformin (JANUMET) 50-1000 MG tablet Take 1 tablet by mouth 2 (two) times daily with a meal.    . tadalafil (CIALIS) 20 MG tablet Take 20 mg by mouth daily as needed for erectile dysfunction.    . tamsulosin (FLOMAX) 0.4 MG CAPS capsule Take 1 capsule (0.4 mg total) by mouth daily. 30 capsule 0  . XIAFLEX 0.9 MG SOLR     . zolpidem (AMBIEN) 10 MG tablet Take 15 mg by mouth at bedtime.     No current facility-administered medications for this visit.    Review of Systems: GENERAL: negative for malaise, night sweats HEENT: No changes in hearing or vision, no  nose bleeds or other nasal problems. NECK: Negative for lumps, goiter, pain and significant neck swelling RESPIRATORY: Negative for cough, wheezing CARDIOVASCULAR: Negative for chest pain, leg swelling, palpitations, orthopnea GI: SEE HPI MUSCULOSKELETAL: Negative for joint pain or swelling, back pain, and muscle pain. SKIN: Negative for lesions, rash PSYCH: Negative for sleep disturbance, mood disorder and recent psychosocial stressors. HEMATOLOGY Negative for prolonged bleeding, bruising easily, and swollen nodes. ENDOCRINE: Negative for cold or heat intolerance, polyuria, polydipsia and goiter. NEURO: negative for tremor, gait imbalance, syncope and seizures. The remainder of the review of systems is noncontributory.   Physical Exam: BP (!) 144/83 (BP Location: Left Arm, Patient Position: Sitting, Cuff Size: Large)   Pulse 80   Temp 98 F (36.7 C) (Oral)   Ht 5' 11" (1.803 m)   Wt 161 lb (73 kg)   BMI 22.45 kg/m  GENERAL: The patient is AO x3, in no acute distress. HEENT: Head is normocephalic and atraumatic. EOMI are intact. Mouth is well hydrated and without lesions. NECK: Supple. No masses LUNGS: Clear to auscultation. No presence of rhonchi/wheezing/rales. Adequate chest expansion HEART: RRR, normal s1 and s2. ABDOMEN: Soft, nontender, no guarding, no peritoneal signs, and nondistended. BS +. No masses. EXTREMITIES: Without any cyanosis, clubbing, rash, lesions or edema. NEUROLOGIC: AOx3, no focal motor deficit. SKIN: no jaundice, no rashes  Imaging/Labs: as above  I personally reviewed and interpreted the available labs, imaging and endoscopic files.  Impression and Plan: LYNDEN FLEMMER is a 60 y.o. male Caucasian male who has coronary artery disease, COPD chronic kidney disease diabetes mellitus, GERD, anxiety, BPH, cardiomyopathy, coronary artery disease status post stent placement, who presents for follow up of chronic nausea and vomiting.  The patient has presented  adequate control of his chronic nausea with the use of promethazine which will be refilled today.  I advised the patient to take it only as needed as it may cause some long-term side effects.  He understands that the nausea is likely centrally related as he has been taking opiates chronically.  Since the use of opiates can alter the results of the gastric emptying study, I will hold off on performing this test.  Finally, the patient will be referred to Roseanne Kaufman for discussion about band ligation of his hemorrhoids.  - Referral to [facility] for hemorrhoid banding - Continue Phenergan as needed for nausea/vomiting  All questions were answered.      Harvel Quale, MD Gastroenterology and Hepatology [facility]

## 2020-05-25 NOTE — Patient Instructions (Addendum)
Referral to [facility] for hemorrhoid banding Continue Phenergan as needed for nausea/vomiting

## 2020-05-29 ENCOUNTER — Encounter: Payer: Self-pay | Admitting: Gastroenterology

## 2020-07-13 ENCOUNTER — Encounter: Payer: Medicare Other | Admitting: Gastroenterology

## 2020-07-13 ENCOUNTER — Encounter: Payer: Self-pay | Admitting: Gastroenterology

## 2020-08-01 ENCOUNTER — Other Ambulatory Visit (INDEPENDENT_AMBULATORY_CARE_PROVIDER_SITE_OTHER): Payer: Self-pay

## 2020-08-01 DIAGNOSIS — K219 Gastro-esophageal reflux disease without esophagitis: Secondary | ICD-10-CM

## 2020-08-01 MED ORDER — PANTOPRAZOLE SODIUM 40 MG PO TBEC: 1.0000 | DELAYED_RELEASE_TABLET | Freq: Two times a day (BID) | ORAL | 1 refills | Status: DC

## 2020-11-21 ENCOUNTER — Telehealth (INDEPENDENT_AMBULATORY_CARE_PROVIDER_SITE_OTHER): Payer: Self-pay

## 2020-11-21 ENCOUNTER — Other Ambulatory Visit (INDEPENDENT_AMBULATORY_CARE_PROVIDER_SITE_OTHER): Payer: Self-pay | Admitting: Gastroenterology

## 2020-11-21 DIAGNOSIS — K219 Gastro-esophageal reflux disease without esophagitis: Secondary | ICD-10-CM

## 2020-11-21 DIAGNOSIS — R112 Nausea with vomiting, unspecified: Secondary | ICD-10-CM

## 2020-11-21 MED ORDER — PROMETHAZINE HCL 25 MG PO TABS: ORAL_TABLET | ORAL | 0 refills | Status: DC

## 2020-11-21 MED ORDER — PANTOPRAZOLE SODIUM 40 MG PO TBEC: 40.0000 mg | DELAYED_RELEASE_TABLET | Freq: Two times a day (BID) | ORAL | 1 refills | Status: DC

## 2020-11-21 NOTE — Telephone Encounter (Signed)
Patient called today asking for a 90 day supply of Promethazine 25 mg bid prn nausea and vomiting, also would like a 90 day supply of Protonix 40 mg BID sent to [facility], [place]. Please advise.

## 2020-11-21 NOTE — Telephone Encounter (Signed)
Medication refilled, will discuss decreasing PPI to once a day dosing in next appointment

## 2020-11-21 NOTE — Telephone Encounter (Signed)
I called and left a detailed message that the medication has been sent to the requested pharmacy.

## 2021-04-04 ENCOUNTER — Telehealth (INDEPENDENT_AMBULATORY_CARE_PROVIDER_SITE_OTHER): Payer: Self-pay | Admitting: *Deleted

## 2021-04-04 ENCOUNTER — Other Ambulatory Visit (INDEPENDENT_AMBULATORY_CARE_PROVIDER_SITE_OTHER): Payer: Self-pay | Admitting: Gastroenterology

## 2021-04-04 DIAGNOSIS — R112 Nausea with vomiting, unspecified: Secondary | ICD-10-CM

## 2021-04-04 MED ORDER — PROMETHAZINE HCL 25 MG PO TABS: ORAL_TABLET | ORAL | 0 refills | Status: DC

## 2021-04-04 NOTE — Telephone Encounter (Signed)
Pt requesting 90 day refill on phenergan to [facility]. [facility] is not listed in chart but I confirmed he wanted [facility] and not [facility]) last office visit 05/25/20 for nausea and vomiting.   [phone].

## 2021-04-04 NOTE — Telephone Encounter (Signed)
Refill sent to pharmacy.   

## 2021-04-04 NOTE — Telephone Encounter (Signed)
Pt notified on voicemail that rx requested was sent to pharm.

## 2021-05-24 ENCOUNTER — Telehealth (INDEPENDENT_AMBULATORY_CARE_PROVIDER_SITE_OTHER): Payer: Self-pay

## 2021-05-24 NOTE — Telephone Encounter (Signed)
Needs follow up appointment, medication was sent in January of this year

## 2021-05-24 NOTE — Telephone Encounter (Signed)
Patient calling wanting a refill on Promethazine sent to [facility]. Patient last seen 05/25/2020. Also looks like this was sent in in January this year for # 180. Please advise.

## 2021-05-24 NOTE — Telephone Encounter (Signed)
Correction that patient aware med was sent to [facility] in January.

## 2021-05-24 NOTE — Telephone Encounter (Signed)
Patient made aware that the medication had been sent in in [facility] to [facility] he will call and see if he can still get this. He was transferred to [facility] to schedule a follow up appointment.

## 2021-06-21 ENCOUNTER — Other Ambulatory Visit: Payer: Self-pay

## 2021-06-21 ENCOUNTER — Ambulatory Visit (INDEPENDENT_AMBULATORY_CARE_PROVIDER_SITE_OTHER): Payer: Medicare Other | Admitting: Gastroenterology

## 2021-06-21 ENCOUNTER — Encounter (INDEPENDENT_AMBULATORY_CARE_PROVIDER_SITE_OTHER): Payer: Self-pay | Admitting: Gastroenterology

## 2021-06-21 VITALS — BP 129/79 | HR 74 | Temp 98.2°F | Ht 71.0 in | Wt 166.9 lb

## 2021-06-21 DIAGNOSIS — Z79891 Long term (current) use of opiate analgesic: Secondary | ICD-10-CM

## 2021-06-21 DIAGNOSIS — K219 Gastro-esophageal reflux disease without esophagitis: Secondary | ICD-10-CM

## 2021-06-21 DIAGNOSIS — R112 Nausea with vomiting, unspecified: Secondary | ICD-10-CM | POA: Diagnosis not present

## 2021-06-21 MED ORDER — OMEPRAZOLE 40 MG PO CPDR: 40.0000 mg | DELAYED_RELEASE_CAPSULE | Freq: Every day | ORAL | 3 refills | Status: DC

## 2021-06-21 MED ORDER — PROMETHAZINE HCL 25 MG PO TABS: ORAL_TABLET | ORAL | 1 refills | Status: DC

## 2021-06-21 MED ORDER — FAMOTIDINE 40 MG PO TABS: 40.0000 mg | ORAL_TABLET | Freq: Every evening | ORAL | 3 refills | Status: DC

## 2021-06-21 NOTE — Progress Notes (Signed)
[name], M.D. ?Gastroenterology & Hepatology ?[facility] ?[address] ?[place], [place] [address] ? ?Primary Care Physician: ?Emelia Loron, NP ?[address] ?[place] [address] ? ?I will communicate my assessment and recommendations to the referring MD via EMR. ? ?Problems: ?Chronic nausea and vomiting ?Chronic intake of opiates ?GERD ? ?History of Present Illness: ?Derek Chen is a 61 y.o. male Caucasian male who has coronary artery disease, COPD chronic kidney disease diabetes mellitus, GERD, anxiety, BPH, cardiomyopathy, coronary artery disease status post stent placement, who presents for follow up of chronic nausea and vomiting. ? ?The patient was last seen on 05/25/2020. At that time, the patient was referred for hemorrhoidal banding given recurrent episodes of hemorrhoidal bleeding.  Phenergan was refilled. ? ?Patient reports that when he eats spicy food, he may present episodes of heartburn and diarrhea. He reports that he is taking pantoprazole 40 mg BID which he feels has worked well for him but does not take anything else if he has breakthrough episodes. No dysphagia or odynophagia. ? ?Takes phenergan for nausea. Takes 1-2 tablets a day, but most of the times takes it once a day.  He feels that this has improved his nausea adequately has not presented any side effects with the medication.  Underwent previous cross-sectional abdominal imaging, EGD and colonoscopy that did not show any abnormalities to explain his nausea.  Notably, he takes Percocet 3 times a day. ? ?The patient denies having any vomiting, fever, chills, hematochezia, melena, hematemesis, abdominal distention, abdominal pain, diarrhea, jaundice, pruritus or persistent weight loss. ? ?Last EGD: 2021, patchy erythema in the posterior wall of the gastric body, duodenitis.  Gastric biopsies were positive for reactive changes and chronic inflammation but negative for H.  pylori or dysplasia.  Biopsies of the duodenum were normal. ?Last Colonoscopy: 2021 - Six 4 to 10 mm polyps in the rectum, at the splenic flexure and in the transverse colon,removed with a hot snare.  Pathology consistent with tubular adenomas. ?Internal hemorrhoids. ?Advised to repeat colonoscopy in 3 years. ? ?Past Medical History: ?Past Medical History:  ?Diagnosis Date  ? Anxiety   ? Arthritis   ? Basal cell cancer   ? Chest and back - status post skin grafting  ? BPH (benign prostatic hyperplasia)   ? Cardiomyopathy ([place])   ? LVEF 45-50%  ? CHF (congestive heart failure) ([place])   ? Complication of anesthesia   ? Difficult to wake up after anesthesia  ? COPD (chronic obstructive pulmonary disease) ([place])   ? Coronary artery disease   ? NSTEMI - DES mid LAD December 2011 (patent December 2013)  ? Dyslipidemia   ? Essential hypertension, benign   ? GERD (gastroesophageal reflux disease)   ? Hepatitis B antibody positive 2006  ? History of kidney stones   ? MI (myocardial infarction) ([place])   ? 3 MI's last in 2016.  ? PONV (postoperative nausea and vomiting)   ? pt states that zofran and scope patch did not help PONV after last surgery.  ? Renal insufficiency   ? Sleep apnea   ? Type 2 diabetes mellitus ([place]) 2012  ? Viral pneumonia, unspecified 2004  ? ? ?Past Surgical History: ?Past Surgical History:  ?Procedure Laterality Date  ? APPENDECTOMY    ? BIOPSY  10/01/2019  ? Procedure: BIOPSY;  Surgeon: Rogene Houston, MD;  [facility];  Service: Endoscopy;;  duodenum ?gastric  ? COLONOSCOPY N/A 08/10/2015  ? Procedure: COLONOSCOPY;  Surgeon: Rogene Houston, MD;  [facility];  Service: Endoscopy;  Laterality: N/A;  730  ? COLONOSCOPY WITH PROPOFOL N/A 10/01/2019  ? Procedure: COLONOSCOPY WITH PROPOFOL;  Surgeon: Rogene Houston, MD;  [facility];  Service: Endoscopy;  Laterality: N/A;  1105  ? CORONARY STENT PLACEMENT    ? DRUG INDUCED ENDOSCOPY N/A 11/30/2018  ? Procedure: DRUG INDUCED  SLEEP ENDOSCOPY;  Surgeon: Melida Quitter, MD;  Location: [facility];  Service: ENT;  Laterality: N/A;  ? ESOPHAGOGASTRODUODENOSCOPY (EGD) WITH PROPOFOL N/A 10/01/2019  ? Procedure: ESOPHAGOGASTRODUODENOSCOPY (EGD) WITH PROPOFOL;  Surgeon: Rogene Houston, MD;  [facility];  Service: Endoscopy;  Laterality: N/A;  ? FINGER SURGERY Right   ? index, gnagrene in finger.  ? KNEE ARTHROSCOPY    ? Left  ? LEFT HEART CATHETERIZATION WITH CORONARY ANGIOGRAM N/A 03/20/2012  ? Procedure: LEFT HEART CATHETERIZATION WITH CORONARY ANGIOGRAM;  Surgeon: Thayer Headings, MD;  Location: [facility] CATH LAB;  Service: Cardiovascular;  Laterality: N/A;  ? MASS EXCISION N/A 08/23/2016  ? Procedure: EXCISION BASAL CELL CARCINOMA ON BACK OVER 4 CM;  Surgeon: Aviva Signs, MD;  Location[facility];  Service: General;  Laterality: N/A;  ? POLYPECTOMY  10/01/2019  ? Procedure: POLYPECTOMY;  Surgeon: Rogene Houston, MD;  [facility];  Service: Endoscopy;;  colon  ? PORT-A-CATH REMOVAL    ? PORTACATH PLACEMENT    ? SKIN GRAFT SPLIT THICKNESS TRUNK    01/23/2010   ? Right chest;  Felicie Morn, M.D.  ? VASECTOMY    ? ? ?Family History: ?Family History  ?Problem Relation Age of Onset  ? Diabetes Mother   ? Deep vein thrombosis Mother   ? Pulmonary embolism Mother   ? Cancer Father   ?     hodgins lymphoma  ? Diabetes Sister   ? ? ?Social History: ?Social History  ? ?Tobacco Use  ?Smoking Status Some Days  ? Packs/day: 0.50  ? Years: 35.00  ? Pack years: 17.50  ? Types: Cigarettes  ?Smokeless Tobacco Former  ? Types: Chew  ? Quit date: 08/22/2006  ? ?Social History  ? ?Substance and Sexual Activity  ?Alcohol Use Not Currently  ? Comment: Drinks occasional beer and wine  ? ?Social History  ? ?Substance and Sexual Activity  ?Drug Use No  ? ? ?Allergies: ?Allergies  ?Allergen Reactions  ? Bupropion Other (See Comments)  ?  paralyzed  ? Trazodone Palpitations and Other (See Comments)  ?  paralyzed  ? Ketorolac Diarrhea  ?  Acetaminophen Hives  ?  Too much give him hives  ? Dilaudid [Hydromorphone] Nausea And Vomiting  ?  Tolerates IV formulation--unable to tolerate pill form  ? Ibuprofen Itching and Other (See Comments)  ? Meloxicam Other (See Comments)  ?  Severe headaches.  ? Naproxen Nausea Only  ? Nsaids Hives  ?  Take to much causes hives  ? Other Diarrhea  ? Tramadol Hives and Itching  ? Vicodin [Hydrocodone-Acetaminophen] Hives  ? Cimetidine Other (See Comments) and Rash  ?  headaches ?Severe headache  ? Gabapentin Anxiety  ?  "Neck twitches"  ? Tape Rash  ?  Paper tape  ? ? ?Medications: ?Current Outpatient Medications  ?Medication Sig Dispense Refill  ? ACCU-CHEK GUIDE test strip 2 (two) times daily. check    ? Accu-Chek Softclix Lancets lancets 2 (two) times daily.    ? Acetaminophen 500 MG capsule Take 500 mg by mouth  every 4 (four) hours as needed (Pain).     ? albuterol (PROVENTIL HFA;VENTOLIN HFA) 108 (90 Base) MCG/ACT inhaler Inhale 1-2 puffs into the lungs daily as needed (for shortness of breath/wheezing). 1 Inhaler 0  ? aspirin 325 MG tablet Take 325 mg by mouth daily.    ? Blood Glucose Monitoring Suppl (ACCU-CHEK GUIDE) w/Device KIT See admin instructions.    ? budesonide (PULMICORT) 0.5 MG/2ML nebulizer solution Take 0.5 mg by nebulization every 6 (six) hours as needed (Shortness of breath).     ? chlorpheniramine-HYDROcodone (TUSSIONEX) 10-8 MG/5ML SUER Take 5 mLs by mouth daily as needed for cough.     ? Cinnamon 500 MG TABS Take 1,000 mg by mouth daily.    ? clonazePAM (KLONOPIN) 1 MG tablet Take 1 mg by mouth 3 (three) times daily.     ? clopidogrel (PLAVIX) 75 MG tablet Take 1 tablet (75 mg total) by mouth daily.    ? DULoxetine (CYMBALTA) 60 MG capsule Take 1 capsule (60 mg total) by mouth 2 (two) times daily. 60 capsule 0  ? fluticasone (FLONASE) 50 MCG/ACT nasal spray Place 2 sprays into both nostrils daily as needed for allergies or rhinitis.     ? Fluticasone Furoate (ARNUITY ELLIPTA) 100 MCG/ACT AEPB  Inhale 1 puff into the lungs in the morning and at bedtime.    ? glimepiride (AMARYL) 4 MG tablet Take 1 tablet (4 mg total) by mouth daily with breakfast. For diabetes 30 tablet 0  ? lidocaine Tillie Fantasia

## 2021-06-21 NOTE — Patient Instructions (Addendum)
Continue phenergan 25 mg as needed for severe nausea ?Stop pantoprazole ?Start omeprazole 40 mg qday and Pepcid at night ?Explained presumed etiology of reflux symptoms. Instruction provided in the use of antireflux medication - patient should take medication in the morning 30-45 minutes before eating breakfast. Discussed avoidance of eating within 2 hours of lying down to sleep and benefit of blocks to elevate head of bed. Also, will benefit from avoiding carbonated drinks/sodas or food that has tomatoes, spicy or greasy food. ? ? ?

## 2021-08-23 ENCOUNTER — Other Ambulatory Visit (INDEPENDENT_AMBULATORY_CARE_PROVIDER_SITE_OTHER): Payer: Self-pay | Admitting: Gastroenterology

## 2021-08-23 DIAGNOSIS — K219 Gastro-esophageal reflux disease without esophagitis: Secondary | ICD-10-CM

## 2021-08-24 ENCOUNTER — Ambulatory Visit (INDEPENDENT_AMBULATORY_CARE_PROVIDER_SITE_OTHER): Payer: Medicare Other | Admitting: Internal Medicine

## 2021-08-24 ENCOUNTER — Encounter: Payer: Self-pay | Admitting: Internal Medicine

## 2021-08-24 VITALS — BP 128/80 | HR 84 | Ht 71.0 in | Wt 166.0 lb

## 2021-08-24 DIAGNOSIS — R079 Chest pain, unspecified: Secondary | ICD-10-CM | POA: Diagnosis not present

## 2021-08-24 DIAGNOSIS — I255 Ischemic cardiomyopathy: Secondary | ICD-10-CM

## 2021-08-24 DIAGNOSIS — I2510 Atherosclerotic heart disease of native coronary artery without angina pectoris: Secondary | ICD-10-CM

## 2021-08-24 DIAGNOSIS — E785 Hyperlipidemia, unspecified: Secondary | ICD-10-CM

## 2021-08-24 DIAGNOSIS — I10 Essential (primary) hypertension: Secondary | ICD-10-CM

## 2021-08-24 NOTE — Progress Notes (Signed)
Cardiology Office Note  Date: 08/24/2021   ID: Derek Chen, DOB Mar 17, 1961, MRN [id number]  PCP:  Emelia Loron, NP  Cardiologist:  Kate Sable, MD (Inactive) Electrophysiologist:  None   Chief Complaint: Preop clearance colonoscopy  History of Present Illness: Derek Chen is a 61 y.o. male with a history of chest pain, coronary artery disease with DES placement to LAD 2011 with patent stent by cath in 2013.  Other history includes COPD, HLD, HTN, GERD, OSA, type II DM, MI (3 MIs, last in 2016).  Echocardiogram 04/18/2017 moderately reduced systolic function with EF of 40%, grade 2 DD with R WMA's consistent with ischemic heart disease.  Last saw Dr. Bronson Ing on 05/02/2017.  He continued to have the left precordial chest pain.  Stated he sometimes took 1 or 2 nitroglycerin tablets to alleviate pain.  Also complaining of bilateral leg cramps.  Status anxiety had not been under control in spite of medications prescribed by psychiatry.  He was requesting Fioricet and clonazepam.  Plavix was discontinued.  Lexiscan Myoview stress test was ordered to evaluate for anterior wall ischemia in the area of his LAD stent.  EF last visit 40%.  Symptomatically stable without signs of heart failure.  He was normotensive.  He was continue his Crestor 10 mg daily.  He was following with psychiatry for depression and previous suicide attempt.  Lexiscan stress test 05/2017 was negative for for blockages per Dr Court Joy note.  08/24/2021  Derek Chen is seen today as a new patient to me.  He had previously been seen by Levell July, NP and Dr. Bronson Ing  He has a history of coronary artery disease, initially identified by Dr. Lutricia Feil.  He had acute MI and underwent PCI to the LAD.  Had an ischemic evaluation 2019, which demonstrated no reversible ischemia as well as a moderate size inferolateral defect with mild reversibility and a distal anterolateral and apical fixed defect suggestive of scar.   LVEF was 51%.  Medical therapy was recommended.  Echo just prior to that demonstrated LVEF 40%.  Since we last saw him, he has had some escalation in his lisinopril due to elevated blood pressures from 2.5 up to 20 mg daily.  He reports he has been using nitro more frequently over the past 2 to 3 months.  He has had some progressive shortness of breath, fatigue and left-sided chest discomfort as well as left arm pain, similar symptoms to that he experienced prior to his MI.  He also reports significant anxiety and questioned a possible need for more anxiolytic medication.  This was noted previously (see above).  EKG shows no acute ischemic changes.   Past Medical History:  Diagnosis Date   Anxiety    Arthritis    Basal cell cancer    Chest and back - status post skin grafting   BPH (benign prostatic hyperplasia)    Cardiomyopathy (HCC)    LVEF 45-50%   CHF (congestive heart failure) (HCC)    Complication of anesthesia    Difficult to wake up after anesthesia   COPD (chronic obstructive pulmonary disease) (HCC)    Coronary artery disease    NSTEMI - DES mid LAD December 2011 (patent December 2013)   Dyslipidemia    Essential hypertension, benign    GERD (gastroesophageal reflux disease)    Hepatitis B antibody positive 2006   History of kidney stones    MI (myocardial infarction) ([place])    3 MI's last in  2016.   PONV (postoperative nausea and vomiting)    pt states that zofran and scope patch did not help PONV after last surgery.   Renal insufficiency    Sleep apnea    Type 2 diabetes mellitus ([place]) 2012   Viral pneumonia, unspecified 2004    Past Surgical History:  Procedure Laterality Date   APPENDECTOMY     BIOPSY  10/01/2019   Procedure: BIOPSY;  Surgeon: Rogene Houston, MD;  [facility];  Service: Endoscopy;;  duodenum gastric   COLONOSCOPY N/A 08/10/2015   Procedure: COLONOSCOPY;  Surgeon: Rogene Houston, MD;  [facility];  Service: Endoscopy;   Laterality: N/A;  730   COLONOSCOPY WITH PROPOFOL N/A 10/01/2019   Procedure: COLONOSCOPY WITH PROPOFOL;  Surgeon: Rogene Houston, MD;  [facility]: [facility];  Service: Endoscopy;  Laterality: N/A;  1105   CORONARY STENT PLACEMENT     DRUG INDUCED ENDOSCOPY N/A 11/30/2018   Procedure: DRUG INDUCED SLEEP ENDOSCOPY;  Surgeon: Melida Quitter, MD;  Location: [place];  Service: ENT;  Laterality: N/A;   ESOPHAGOGASTRODUODENOSCOPY (EGD) WITH PROPOFOL N/A 10/01/2019   Procedure: ESOPHAGOGASTRODUODENOSCOPY (EGD) WITH PROPOFOL;  Surgeon: Rogene Houston, MD;  [facility];  Service: Endoscopy;  Laterality: N/A;   FINGER SURGERY Right    index, gnagrene in finger.   KNEE ARTHROSCOPY     Left   LEFT HEART CATHETERIZATION WITH CORONARY ANGIOGRAM N/A 03/20/2012   Procedure: LEFT HEART CATHETERIZATION WITH CORONARY ANGIOGRAM;  Surgeon: Thayer Headings, MD;  Location: [facility] CATH LAB;  Service: Cardiovascular;  Laterality: N/A;   MASS EXCISION N/A 08/23/2016   Procedure: EXCISION BASAL CELL CARCINOMA ON BACK OVER 4 CM;  Surgeon: Aviva Signs, MD;  Location: [facility];  Service: General;  Laterality: N/A;   POLYPECTOMY  10/01/2019   Procedure: POLYPECTOMY;  Surgeon: Rogene Houston, MD;  [facility];  Service: Endoscopy;;  colon   PORT-A-CATH REMOVAL     PORTACATH PLACEMENT     SKIN GRAFT SPLIT THICKNESS TRUNK    01/23/2010    Right chest;  Felicie Morn, M.D.   VASECTOMY      Current Outpatient Medications  Medication Sig Dispense Refill   ACCU-CHEK GUIDE test strip 2 (two) times daily. check     Accu-Chek Softclix Lancets lancets 2 (two) times daily.     Acetaminophen 500 MG capsule Take 500 mg by mouth every 4 (four) hours as needed (Pain).      albuterol (PROVENTIL HFA;VENTOLIN HFA) 108 (90 Base) MCG/ACT inhaler Inhale 1-2 puffs into the lungs daily as needed (for shortness of breath/wheezing). 1 Inhaler 0   aspirin 325 MG tablet Take 325 mg by mouth daily.      Blood Glucose Monitoring Suppl (ACCU-CHEK GUIDE) w/Device KIT See admin instructions.     budesonide (PULMICORT) 0.5 MG/2ML nebulizer solution Take 0.5 mg by nebulization every 6 (six) hours as needed (Shortness of breath).      chlorpheniramine-HYDROcodone (TUSSIONEX) 10-8 MG/5ML SUER Take 5 mLs by mouth daily as needed for cough.      Cinnamon 500 MG TABS Take 1,000 mg by mouth daily.     clonazePAM (KLONOPIN) 1 MG tablet Take 1 mg by mouth 3 (three) times daily.      clopidogrel (PLAVIX) 75 MG tablet Take 1 tablet (75 mg total) by mouth daily.     DULoxetine (CYMBALTA) 60 MG capsule Take 1 capsule (60 mg total) by mouth 2 (two)  times daily. 60 capsule 0   famotidine (PEPCID) 40 MG tablet Take 1 tablet (40 mg total) by mouth at bedtime. 90 tablet 3   fluticasone (FLONASE) 50 MCG/ACT nasal spray Place 2 sprays into both nostrils daily as needed for allergies or rhinitis.      Fluticasone Furoate (ARNUITY ELLIPTA) 100 MCG/ACT AEPB Inhale 1 puff into the lungs in the morning and at bedtime.     glimepiride (AMARYL) 4 MG tablet Take 1 tablet (4 mg total) by mouth daily with breakfast. For diabetes 30 tablet 0   lidocaine (XYLOCAINE) 5 % ointment Apply 1 application topically daily as needed for mild pain.      lisinopril (ZESTRIL) 20 MG tablet Take 20 mg by mouth daily.     metoprolol succinate (TOPROL-XL) 50 MG 24 hr tablet Take 50 mg by mouth daily.     nitroGLYCERIN (NITROSTAT) 0.4 MG SL tablet Place 1 tablet (0.4 mg total) under the tongue every 5 (five) minutes as needed for chest pain. 5 tablet 1   omeprazole (PRILOSEC) 40 MG capsule TAKE 1 CAPSULE BY MOUTH DAILY 90 capsule 2   oxyCODONE-Acetaminophen (PERCOCET PO) Take 15 mg by mouth 3 (three) times daily.     promethazine (PHENERGAN) 25 MG tablet TAKE 1 TABLET BY MOUTH TWICE DAILY AS NEEDED FOR NAUSEA AND FOR VOMITING 180 tablet 1   rosuvastatin (CRESTOR) 20 MG tablet Take 20 mg by mouth at bedtime.     sitaGLIPtin-metformin (JANUMET)  50-1000 MG tablet Take 1 tablet by mouth 2 (two) times daily with a meal.     tadalafil (CIALIS) 20 MG tablet Take 20 mg by mouth daily as needed for erectile dysfunction.     tamsulosin (FLOMAX) 0.4 MG CAPS capsule Take 1 capsule (0.4 mg total) by mouth daily. (Patient taking differently: Take 0.4 mg by mouth 2 (two) times daily.) 30 capsule 0   zolpidem (AMBIEN) 10 MG tablet Take 10 mg by mouth at bedtime.     No current facility-administered medications for this visit.   Allergies:  Bupropion, Trazodone, Ketorolac, Acetaminophen, Dilaudid [hydromorphone], Ibuprofen, Meloxicam, Naproxen, Nsaids, Other, Tramadol, Vicodin [hydrocodone-acetaminophen], Cimetidine, Gabapentin, and Tape   Social History: The patient  reports that he has been smoking cigarettes. He has a 17.50 pack-year smoking history. He quit smokeless tobacco use about 15 years ago.  His smokeless tobacco use included chew. He reports that he does not currently use alcohol. He reports that he does not use drugs.   Family History: The patient's family history includes Cancer in his father; Deep vein thrombosis in his mother; Diabetes in his mother and sister; Pulmonary embolism in his mother.   ROS:    Pertinent items noted in HPI and remainder of comprehensive ROS otherwise negative.   Physical Exam: VS:  BP 128/80   Pulse 84   Ht '5\' 11"'  (1.803 m)   Wt 166 lb (75.3 kg)   SpO2 97%   BMI 23.15 kg/m , BMI Body mass index is 23.15 kg/m.  Wt Readings from Last 3 Encounters:  08/24/21 166 lb (75.3 kg)  06/21/21 166 lb 14.4 oz (75.7 kg)  05/25/20 161 lb (73 kg)    General appearance: alert, no distress, and pale Neck: no carotid bruit, no JVD, and thyroid not enlarged, symmetric, no tenderness/mass/nodules Lungs: clear to auscultation bilaterally Heart: regular rate and rhythm, S1, S2 normal, no murmur, click, rub or gallop Abdomen: soft, non-tender; bowel sounds normal; no masses,  no organomegaly Extremities:  extremities normal, atraumatic,  no cyanosis or edema Pulses: 2+ and symmetric Skin: Skin color, texture, turgor normal. No rashes or lesions Neurologic: Grossly normal Psych: Mildly anxious  ECG: Sinus rhythm at 81, bifascicular block, LVH by voltage-personally reviewed  Recent Labwork: No results found for requested labs within last 8760 hours.     Component Value Date/Time   CHOL 142 09/16/2015 0410   CHOL 134 09/11/2012 0903   TRIG 112 09/16/2015 0410   TRIG 306 (H) 02/17/2014 0914   TRIG 93 09/11/2012 0903   HDL 36 (L) 09/16/2015 0410   HDL 26 (L) 02/17/2014 0914   HDL 36 (L) 09/11/2012 0903   CHOLHDL 3.9 09/16/2015 0410   VLDL 22 09/16/2015 0410   LDLCALC 84 09/16/2015 0410   LDLCALC 139 (H) 11/10/2013 0851   LDLCALC 79 09/11/2012 0903    Other Studies Reviewed Today:  Lexiscan Myoview stress test 05/12/2017 Study Result  Narrative & Impression  There was no ST segment deviation noted during stress. Findings consistent with prior distal anterolateral/apical myocardial infarction. There is a moderate sized inferolateral defect with mild reversibility. There is a significant amount of gut radiotracer adjacent to this segment which may create artifact. Findings would suggest moderate infarct with mild peri-infarct ischemia The left ventricular ejection fraction is normal (51%). This is a low risk study.      Echocardiogram 04/18/2017 Study Conclusions   - Left ventricle: The cavity size was normal. Wall thickness was    normal. Systolic function was moderately reduced. The estimated    ejection fraction was 40%. Features are consistent with a    pseudonormal left ventricular filling pattern, with concomitant    abnormal relaxation and increased filling pressure (grade 2    diastolic dysfunction).  - Regional wall motion abnormality: Hypokinesis of the mid    inferolateral, mid anterolateral, and apical lateral myocardium;    mild hypokinesis of the mid-apical  inferior myocardium.  Assessment and Plan:  Derek Chen has a history of coronary artery disease with prior anterior MI and had a LAD stent.  His LVEF was as low as the 40% range in 2019 and SPECT Myoview showed a fixed anteroapical defect with a partially reversible inferolateral defect and LVEF 51%.  He says in the past several months he has had worsening chest discomfort left arm pain and shortness of breath as well as fatigue and decreased exercise tolerance.  Symptoms are similar to before his MI.  He will need a repeat ischemia evaluation.  Would also repeat an echo given his history of systolic heart failure although no signs or symptoms of heart failure on exam today.  Plan follow-up afterwards.   Medication Adjustments/Labs and Tests Ordered: Current medicines are reviewed at length with the patient today.  Concerns regarding medicines are outlined above.   Disposition: Follow-up after studies  Pixie Casino, MD, [facility], [place] Director of the Advanced Lipid Disorders &  Cardiovascular Risk Reduction Clinic Diplomate of the American Board of Clinical Lipidology Attending Cardiologist  Direct Dial: [phone]  Fax: [phone]  Website:  [email or web address]

## 2021-08-24 NOTE — Patient Instructions (Signed)
Medication Instructions:  Your physician recommends that you continue on your current medications as directed. Please refer to the Current Medication list given to you today.   Labwork: None today  Testing/Procedures: Your physician has requested that you have an echocardiogram. Echocardiography is a painless test that uses sound waves to create images of your heart. It provides your doctor with information about the size and shape of your heart and how well your heart's chambers and valves are working. This procedure takes approximately one hour. There are no restrictions for this procedure.   Your physician has requested that you have a lexiscan myoview. For further information please visit [email or web address]. Please follow instruction sheet, as given.   Follow-Up: After testing   Any Other Special Instructions Will Be Listed Below (If Applicable).  If you need a refill on your cardiac medications before your next appointment, please call your pharmacy.

## 2021-08-30 ENCOUNTER — Ambulatory Visit (HOSPITAL_COMMUNITY)
Admission: RE | Admit: 2021-08-30 | Discharge: 2021-08-30 | Disposition: A | Discharge status: Home / Self Care | Payer: Medicare Other | Source: Ambulatory Visit | Attending: Internal Medicine | Admitting: Internal Medicine

## 2021-08-30 ENCOUNTER — Encounter (HOSPITAL_COMMUNITY)
Admission: RE | Admit: 2021-08-30 | Discharge: 2021-08-30 | Disposition: A | Discharge status: Home / Self Care | Payer: Medicare Other | Source: Ambulatory Visit | Attending: Internal Medicine | Admitting: Internal Medicine

## 2021-08-30 DIAGNOSIS — R079 Chest pain, unspecified: Secondary | ICD-10-CM | POA: Insufficient documentation

## 2021-08-30 MED ORDER — TECHNETIUM TC 99M TETROFOSMIN IV KIT
30.0000 | PACK | Freq: Once | INTRAVENOUS | Status: AC | PRN
  Administered 2021-08-30: 30 via INTRAVENOUS

## 2021-08-30 MED ORDER — SODIUM CHLORIDE FLUSH 0.9 % IV SOLN
INTRAVENOUS | Status: AC
  Administered 2021-08-30: 10 mL via INTRAVENOUS
  Filled 2021-08-30: qty 10

## 2021-08-30 MED ORDER — TECHNETIUM TC 99M TETROFOSMIN IV KIT
10.0000 | PACK | Freq: Once | INTRAVENOUS | Status: AC | PRN
  Administered 2021-08-30: 9.76 via INTRAVENOUS

## 2021-08-30 MED ORDER — REGADENOSON 0.4 MG/5ML IV SOLN
INTRAVENOUS | Status: AC
  Administered 2021-08-30: 0.4 mg via INTRAVENOUS
  Filled 2021-08-30: qty 5

## 2021-08-31 ENCOUNTER — Other Ambulatory Visit (INDEPENDENT_AMBULATORY_CARE_PROVIDER_SITE_OTHER): Payer: Self-pay | Admitting: Gastroenterology

## 2021-08-31 ENCOUNTER — Other Ambulatory Visit (INDEPENDENT_AMBULATORY_CARE_PROVIDER_SITE_OTHER): Payer: Self-pay | Admitting: *Deleted

## 2021-08-31 ENCOUNTER — Telehealth (INDEPENDENT_AMBULATORY_CARE_PROVIDER_SITE_OTHER): Payer: Self-pay | Admitting: *Deleted

## 2021-08-31 DIAGNOSIS — K219 Gastro-esophageal reflux disease without esophagitis: Secondary | ICD-10-CM

## 2021-08-31 LAB — NM MYOCAR MULTI W/SPECT W/WALL MOTION / EF
LV dias vol: 97 mL (ref 62–150)
LV sys vol: 44 mL
Nuc Stress EF: 55 %
Peak HR: 108 {beats}/min
RATE: 0.4
Rest HR: 74 {beats}/min
Rest Nuclear Isotope Dose: 9.8 mCi
SDS: 2
SRS: 12
SSS: 14
Stress Nuclear Isotope Dose: 30 mCi
TID: 1.07

## 2021-08-31 MED ORDER — PANTOPRAZOLE SODIUM 40 MG PO TBEC
40.0000 mg | DELAYED_RELEASE_TABLET | Freq: Every day | ORAL | 3 refills | Status: DC
Start: 2021-08-31 — End: 2021-08-31

## 2021-08-31 MED ORDER — PANTOPRAZOLE SODIUM 40 MG PO TBEC: 40.0000 mg | DELAYED_RELEASE_TABLET | Freq: Every day | ORAL | 3 refills | Status: DC

## 2021-08-31 NOTE — Telephone Encounter (Signed)
Pt seen march 2023 and pantoprazole bid was changed to omeprazole '40mg'$  qam and famotidine '40mg'$  at night. He states [facility] told him  there is an interaction between plavix and omeprazole. Patient wants to know if he can go back to pantoprazole '40mg'$  bid.   [phone]

## 2021-08-31 NOTE — Telephone Encounter (Signed)
That should be OK, will stop omeprazole and switch him to pantoprazole. Rx sent to [facility]

## 2021-08-31 NOTE — Telephone Encounter (Signed)
Discussed with Patient and he verbalized understanding. he wanted at [facility] drug. I called [facility] and canceled and sent to [facility] and let [facility] know to cancel omeprazole.

## 2021-08-31 NOTE — Telephone Encounter (Signed)
See other message from today

## 2021-08-31 NOTE — Telephone Encounter (Signed)
Pt left voicemail that he wanted a call back about a medication that Dr. Jenetta Downer prescribed. I called and left patient a message to return my call.

## 2021-09-04 ENCOUNTER — Ambulatory Visit (HOSPITAL_COMMUNITY)
Admission: RE | Admit: 2021-09-04 | Discharge: 2021-09-04 | Disposition: A | Discharge status: Home / Self Care | Payer: Medicare Other | Source: Ambulatory Visit | Attending: Internal Medicine | Admitting: Internal Medicine

## 2021-09-04 DIAGNOSIS — R079 Chest pain, unspecified: Secondary | ICD-10-CM | POA: Diagnosis not present

## 2021-09-04 LAB — ECHOCARDIOGRAM COMPLETE
Area-P 1/2: 2.03 cm2
S' Lateral: 3.1 cm

## 2021-09-04 NOTE — Progress Notes (Signed)
*  PRELIMINARY RESULTS* Echocardiogram 2D Echocardiogram has been performed.  Derek Chen 09/04/2021, 9:17 AM

## 2021-09-11 ENCOUNTER — Telehealth: Payer: Self-pay | Admitting: Internal Medicine

## 2021-09-11 NOTE — Telephone Encounter (Signed)
Patient was going in to discuss his results for his echo.Please advise

## 2021-09-11 NOTE — Telephone Encounter (Signed)
Left message to return call 

## 2021-09-11 NOTE — Telephone Encounter (Signed)
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  Interestingly, his echo always shows a lower heart pumping strength than his nuclear stress test does.  But both studies are very similar to the ones performed before in 2019.         Fidel Levy, RN  09/03/2021  8:17 AM EDT     Results sent to MyChart by MD   Sanda Klein, MD  09/02/2021  3:30 PM EDT     Good news on the nuclear stress test.  There is evidence of previous heart attack, very similar to the studies from 2013 and 2019, but there do not appear to be any other areas of heart muscle and jeopardy.  Normal overall heart pumping strength

## 2021-09-12 NOTE — Telephone Encounter (Signed)
Patient is returning call.  °

## 2021-09-12 NOTE — Telephone Encounter (Signed)
Patient notified and verbalized understanding. Pt had no questions or concerns at this time 

## 2021-10-19 ENCOUNTER — Ambulatory Visit: Payer: Medicare Other | Admitting: Internal Medicine

## 2022-02-08 ENCOUNTER — Other Ambulatory Visit (INDEPENDENT_AMBULATORY_CARE_PROVIDER_SITE_OTHER): Payer: Self-pay | Admitting: Gastroenterology

## 2022-02-08 DIAGNOSIS — R112 Nausea with vomiting, unspecified: Secondary | ICD-10-CM

## 2022-02-08 NOTE — Telephone Encounter (Signed)
Last seen 06/21/21

## 2022-02-11 ENCOUNTER — Encounter (INDEPENDENT_AMBULATORY_CARE_PROVIDER_SITE_OTHER): Payer: Self-pay | Admitting: Gastroenterology

## 2022-04-24 ENCOUNTER — Encounter (INDEPENDENT_AMBULATORY_CARE_PROVIDER_SITE_OTHER): Payer: Self-pay | Admitting: Gastroenterology

## 2022-06-24 ENCOUNTER — Ambulatory Visit (INDEPENDENT_AMBULATORY_CARE_PROVIDER_SITE_OTHER): Payer: Medicare Other | Admitting: Gastroenterology

## 2022-06-27 ENCOUNTER — Ambulatory Visit (INDEPENDENT_AMBULATORY_CARE_PROVIDER_SITE_OTHER): Payer: Medicare Other | Admitting: Gastroenterology

## 2022-07-26 ENCOUNTER — Other Ambulatory Visit (INDEPENDENT_AMBULATORY_CARE_PROVIDER_SITE_OTHER): Payer: Self-pay | Admitting: Gastroenterology

## 2022-07-26 DIAGNOSIS — K219 Gastro-esophageal reflux disease without esophagitis: Secondary | ICD-10-CM

## 2022-08-18 ENCOUNTER — Other Ambulatory Visit (INDEPENDENT_AMBULATORY_CARE_PROVIDER_SITE_OTHER): Payer: Self-pay | Admitting: Gastroenterology

## 2022-08-18 DIAGNOSIS — R112 Nausea with vomiting, unspecified: Secondary | ICD-10-CM

## 2022-08-19 NOTE — Telephone Encounter (Signed)
Last visit over one year ago. 06/21/21. Has appt scheduled on 10/24/22

## 2022-09-02 ENCOUNTER — Encounter (INDEPENDENT_AMBULATORY_CARE_PROVIDER_SITE_OTHER): Payer: Self-pay | Admitting: *Deleted

## 2022-10-14 ENCOUNTER — Telehealth (INDEPENDENT_AMBULATORY_CARE_PROVIDER_SITE_OTHER): Payer: Self-pay | Admitting: Gastroenterology

## 2022-10-14 NOTE — Telephone Encounter (Signed)
Room 3, two day prep Thanks

## 2022-10-14 NOTE — Telephone Encounter (Signed)
Who is your primary care physician: Carmel [place] FNP [facility]  Reasons for the colonoscopy: recall  Have you had a colonoscopy before?  Yes 2021?  Do you have family history of colon cancer? no  Previous colonoscopy with polyps removed? yes  Do you have a history colorectal cancer?   no  Are you diabetic? If yes, Type 1 or Type 2?    Yes Type 2  Do you have a prosthetic or mechanical heart valve? no  Do you have a pacemaker/defibrillator?   no  Have you had endocarditis/atrial fibrillation? no  Have you had joint replacement within the last 12 months?  no  Do you tend to be constipated or have to use laxatives? no  Do you have any history of drugs or alchohol?  no  Do you use supplemental oxygen?  no  Have you had a stroke or heart attack within the last 6 months? no  Do you take weight loss medication?  no      Do you take any blood-thinning medications such as: (aspirin, warfarin, Plavix, Aggrenox)  yes  If yes we need the name, milligram, dosage and who is prescribing doctor Aspirin 325 mg; Plavix 75 mg Current Outpatient Medications on File Prior to Visit  Medication Sig Dispense Refill   ACCU-CHEK GUIDE test strip 2 (two) times daily. check     Accu-Chek Softclix Lancets lancets 2 (two) times daily.     Acetaminophen 500 MG capsule Take 500 mg by mouth every 4 (four) hours as needed (Pain).      albuterol (PROVENTIL HFA;VENTOLIN HFA) 108 (90 Base) MCG/ACT inhaler Inhale 1-2 puffs into the lungs daily as needed (for shortness of breath/wheezing). 1 Inhaler 0   aspirin 325 MG tablet Take 325 mg by mouth daily.     Blood Glucose Monitoring Suppl (ACCU-CHEK GUIDE) w/Device KIT See admin instructions.     budesonide (PULMICORT) 0.5 MG/2ML nebulizer solution Take 0.5 mg by nebulization every 6 (six) hours as needed (Shortness of breath).      chlorpheniramine-HYDROcodone (TUSSIONEX) 10-8 MG/5ML SUER Take 5 mLs by mouth daily as needed for cough.      Cinnamon 500 MG TABS  Take 1,000 mg by mouth daily.     clonazePAM (KLONOPIN) 1 MG tablet Take 1 mg by mouth 3 (three) times daily.      clopidogrel (PLAVIX) 75 MG tablet Take 1 tablet (75 mg total) by mouth daily.     DULoxetine (CYMBALTA) 60 MG capsule Take 1 capsule (60 mg total) by mouth 2 (two) times daily. 60 capsule 0   famotidine (PEPCID) 40 MG tablet Take 1 tablet (40 mg total) by mouth at bedtime. 90 tablet 3   fluticasone (FLONASE) 50 MCG/ACT nasal spray Place 2 sprays into both nostrils daily as needed for allergies or rhinitis.      Fluticasone Furoate (ARNUITY ELLIPTA) 100 MCG/ACT AEPB Inhale 1 puff into the lungs in the morning and at bedtime.     glimepiride (AMARYL) 4 MG tablet Take 1 tablet (4 mg total) by mouth daily with breakfast. For diabetes 30 tablet 0   lidocaine (XYLOCAINE) 5 % ointment Apply 1 application topically daily as needed for mild pain.      lisinopril (ZESTRIL) 20 MG tablet Take 20 mg by mouth daily.     metFORMIN (GLUCOPHAGE) 1000 MG tablet Take by mouth.     metoprolol succinate (TOPROL-XL) 50 MG 24 hr tablet Take 50 mg by mouth daily.     nitroGLYCERIN (  NITROSTAT) 0.4 MG SL tablet Place 1 tablet (0.4 mg total) under the tongue every 5 (five) minutes as needed for chest pain. 5 tablet 1   oxyCODONE-Acetaminophen (PERCOCET PO) Take 15 mg by mouth 3 (three) times daily.     pantoprazole (PROTONIX) 40 MG tablet TAKE 1 TABLET BY MOUTH DAILY 90 tablet 3   promethazine (PHENERGAN) 25 MG tablet TAKE 1 TABLET BY MOUTH TWICE DAILY AS NEEDED FOR NAUSEA AND VOMITING 180 tablet 1   rosuvastatin (CRESTOR) 20 MG tablet Take 20 mg by mouth at bedtime.     sitaGLIPtin-metformin (JANUMET) 50-1000 MG tablet Take 1 tablet by mouth 2 (two) times daily with a meal.     tadalafil (CIALIS) 20 MG tablet Take 20 mg by mouth daily as needed for erectile dysfunction.     tamsulosin (FLOMAX) 0.4 MG CAPS capsule Take 1 capsule (0.4 mg total) by mouth daily. (Patient taking differently: Take 0.4 mg by mouth 2  (two) times daily.) 30 capsule 0   zolpidem (AMBIEN) 10 MG tablet Take 10 mg by mouth at bedtime.     [DISCONTINUED] fluticasone (FLOVENT HFA) 110 MCG/ACT inhaler Inhale 2 puffs into the lungs 2 (two) times daily. Inhale 1 puff into the lungs 2 times daily.     No current facility-administered medications on file prior to visit.    Allergies  Allergen Reactions   Bupropion Other (See Comments)    paralyzed   Trazodone Palpitations and Other (See Comments)    paralyzed   Ketorolac Diarrhea   Acetaminophen Hives    Too much give him hives   Dilaudid [Hydromorphone] Nausea And Vomiting    Tolerates IV formulation--unable to tolerate pill form   Ibuprofen Itching and Other (See Comments)   Meloxicam Other (See Comments)    Severe headaches.   Naproxen Nausea Only   Nsaids Hives    Take to much causes hives   Other Diarrhea   Tramadol Hives and Itching   Vicodin [Hydrocodone-Acetaminophen] Hives   Cimetidine Other (See Comments) and Rash    headaches Severe headache   Gabapentin Anxiety    "Neck twitches"   Tape Rash    Paper tape     Pharmacy: [facility]  Primary Insurance Name: [employer]  Best number where you can be reached: [phone]

## 2022-10-21 NOTE — Telephone Encounter (Signed)
Pt left message on voicemail checking on questionnaire.  Returned call to pt but had to leave message to return call

## 2022-10-21 NOTE — Telephone Encounter (Signed)
Pt returned call and scheduled tcs for 11/08/22 at 8:00am. Pt has office visit on 10/24/22 and will pick up instructions and prep (requested Clenpiq)then.    Notification or Prior Authorization is not required for the requested services You are not required to submit a notification/prior authorization based on the information provided. If you have general questions about the prior authorization requirements, visit [email or web address] > Clinician Resources > Advance and Admission Notification Requirements. The number above acknowledges your notification. Please write this reference number down for future reference. If you would like to request an organization determination, please call us at [phone]. Decision ID #: [id number]

## 2022-10-22 ENCOUNTER — Encounter (INDEPENDENT_AMBULATORY_CARE_PROVIDER_SITE_OTHER): Payer: Self-pay

## 2022-10-22 NOTE — Telephone Encounter (Signed)
Pt called in and left voicemail needing to reschedule TCS from 11/08/22. Returned call to patient and pt has rescheduled to 11/19/22. New instructions printed and placed up front.

## 2022-10-24 ENCOUNTER — Encounter (INDEPENDENT_AMBULATORY_CARE_PROVIDER_SITE_OTHER): Payer: Self-pay | Admitting: Gastroenterology

## 2022-10-24 ENCOUNTER — Ambulatory Visit (INDEPENDENT_AMBULATORY_CARE_PROVIDER_SITE_OTHER): Payer: Medicare Other | Admitting: Gastroenterology

## 2022-10-24 VITALS — BP 117/78 | HR 80 | Temp 97.9°F | Ht 71.0 in | Wt 159.9 lb

## 2022-10-24 DIAGNOSIS — R112 Nausea with vomiting, unspecified: Secondary | ICD-10-CM | POA: Diagnosis not present

## 2022-10-24 DIAGNOSIS — K219 Gastro-esophageal reflux disease without esophagitis: Secondary | ICD-10-CM | POA: Diagnosis not present

## 2022-10-24 MED ORDER — PANTOPRAZOLE SODIUM 40 MG PO TBEC
40.0000 mg | DELAYED_RELEASE_TABLET | Freq: Two times a day (BID) | ORAL | 3 refills | Status: DC
Start: 2022-10-24 — End: 2023-10-20

## 2022-10-24 MED ORDER — PROMETHAZINE HCL 25 MG PO TABS
25.0000 mg | ORAL_TABLET | Freq: Two times a day (BID) | ORAL | 3 refills | Status: DC | PRN
Start: 2022-10-24 — End: 2023-10-20

## 2022-10-24 NOTE — Patient Instructions (Signed)
Please continue with protonix 40mg , I have sent a refill for you to take this twice daily I have also sent a refill of promethazine 25mg  to be taken up to twice daily for nausea Be mindful of  greasy, spicy, fried, citrus foods, caffeine, carbonated drinks, chocolate and alcohol as these can increase reflux symptoms Stay upright 2-3 hours after eating, prior to lying down and avoid eating late in the evenings.  We will see you in August for your scheduled colonoscopy!  It was a pleasure to see you today. I want to create trusting relationships with patients and provide genuine, compassionate, and quality care. I truly value your feedback! please be on the lookout for a survey regarding your visit with me today. I appreciate your input about our visit and your time in completing this!    Devyn Griffing L. Jeanmarie Hubert, MSN, APRN, AGNP-C Adult-Gerontology Nurse Practitioner [facility] Gastroenterology at [facility]

## 2022-10-24 NOTE — Progress Notes (Addendum)
Referring Provider: Carmel Sacramento, NP Primary Care Physician:  Carmel Sacramento, NP Primary GI Physician: Dr. Levon Hedger   Chief Complaint  Patient presents with   Nausea    Follow up on nausea. Has nausea daily and vomits about once a week. Needs refill on promethazine.    Gastroesophageal Reflux    Follow up on GERD. Takes protonix every morning and sometimes has to take second dose. Needs refill on protonix.    HPI:   Derek Chen is a 62 y.o. male with past medical history of coronary artery disease, COPD chronic kidney disease diabetes mellitus, GERD, anxiety, BPH, cardiomyopathy, coronary artery disease status post stent placement  Patient presenting today for follow up of GERD and nausea  Last seen march 2023, at that time having some heartburn/diarrhea with spicy foods, taking protonix 40mg  BID which was working fairly well. Taking phenergan for nausea usually once daily. Previously Underwent previous cross-sectional abdominal imaging, EGD and colonoscopy that did not show any abnormalities to explain nausea. Taking Percocet 3 times a day   Recommended to stop protonix, start omeprazole 40mg  daily and pepcid at night, continue phenergan 25mg  daily   Switched back to protonix in June 2023 due to being on plavix  Present:  Patient states he is on ozempic once weekly.   He notes that he sometimes has to increase his protonix 40mg  to BID dosing atleast 3 times per week for heartburn and acid regurgitation, notes these symptoms can occur with anything he eats. No dysphagia or odynophagia.   He is taking promethazine usually at night, this typically keeps thing well controlled though sometimes he will have to take the dose earlier around 4-5 pm, he notes vomiting maybe once per week  He is trying to eat healthy. He has lost down to 159, from 166 lbs. Does not feel that nausea has worsened on ozympic. No constipation or diarrhea. He is having 1-3 BMs per day. No rectal bleeding or  melena.   Still taking opiate pain meds for chronic pain 2-3x/day.   Last Colonoscopy: 2021 Six 4 to 10 mm polyps in the rectum, at the splenic flexure and in the transverse colon,removed with a hot snare.  Pathology consistent with tubular adenomas. Internal hemorrhoids. Last EGD: 2021, patchy erythema in the posterior wall of the gastric body, duodenitis.  Gastric biopsies were positive for reactive changes and chronic inflammation but negative for H. pylori or dysplasia.  Biopsies of the duodenum were normal.    Past Medical History:  Diagnosis Date   Anxiety    Arthritis    Basal cell cancer    Chest and back - status post skin grafting   BPH (benign prostatic hyperplasia)    Cardiomyopathy (HCC)    LVEF 45-50%   CHF (congestive heart failure) (HCC)    Complication of anesthesia    Difficult to wake up after anesthesia   COPD (chronic obstructive pulmonary disease) (HCC)    Coronary artery disease    NSTEMI - DES mid LAD December 2011 (patent December 2013)   Dyslipidemia    Essential hypertension, benign    GERD (gastroesophageal reflux disease)    Hepatitis B antibody positive 2006   History of kidney stones    MI (myocardial infarction) (HCC)    3 MI's last in 2016.   PONV (postoperative nausea and vomiting)    pt states that zofran and scope patch did not help PONV after last surgery.   Renal insufficiency    Sleep apnea  Type 2 diabetes mellitus (HCC) 2012   Viral pneumonia, unspecified 2004    Past Surgical History:  Procedure Laterality Date   APPENDECTOMY     BIOPSY  10/01/2019   Procedure: BIOPSY;  Surgeon: Malissa Hippo, MD;  [facility]: [facility];  Service: Endoscopy;;  duodenum gastric   COLONOSCOPY N/A 08/10/2015   Procedure: COLONOSCOPY;  Surgeon: Malissa Hippo, MD;  [facility]: [facility];  Service: Endoscopy;  Laterality: N/A;  730   COLONOSCOPY WITH PROPOFOL N/A 10/01/2019   Procedure: COLONOSCOPY WITH PROPOFOL;  Surgeon: Malissa Hippo, MD;   Location: [facility];  Service: Endoscopy;  Laterality: N/A;  1105   CORONARY STENT PLACEMENT     DRUG INDUCED ENDOSCOPY N/A 11/30/2018   Procedure: DRUG INDUCED SLEEP ENDOSCOPY;  Surgeon: Christia Reading, MD;  Location: [facility];  Service: ENT;  Laterality: N/A;   ESOPHAGOGASTRODUODENOSCOPY (EGD) WITH PROPOFOL N/A 10/01/2019   Procedure: ESOPHAGOGASTRODUODENOSCOPY (EGD) WITH PROPOFOL;  Surgeon: Malissa Hippo, MD;  [facility]: [facility];  Service: Endoscopy;  Laterality: N/A;   FINGER SURGERY Right    index, gnagrene in finger.   KNEE ARTHROSCOPY     Left   LEFT HEART CATHETERIZATION WITH CORONARY ANGIOGRAM N/A 03/20/2012   Procedure: LEFT HEART CATHETERIZATION WITH CORONARY ANGIOGRAM;  Surgeon: [name], MD;  Location: [facility] C[facility];  Service: Cardiovascular;  Laterality: N/A;   MASS EXCISION N/A 08/23/2016   Procedure: EXCISION BASAL CELL CARCINOMA ON BACK OVER 4 CM;  Surgeon: Franky Macho, MD;  Location: [facility];  Service: General;  Laterality: N/A;   POLYPECTOMY  10/01/2019   Procedure: POLYPECTOMY;  Surgeon: Malissa Hippo, MD;  [facility]: [facility];  Service: Endoscopy;;  colon   PORT-A-CATH REMOVAL     PORTACATH PLACEMENT     SKIN GRAFT SPLIT THICKNESS TRUNK    01/23/2010    Right chest;  Barbaraann Barthel, M.D.   VASECTOMY      Current Outpatient Medications  Medication Sig Dispense Refill   ACCU-CHEK GUIDE test strip 2 (two) times daily. check     Accu-Chek Softclix Lancets lancets 2 (two) times daily.     Acetaminophen 500 MG capsule Take 500 mg by mouth every 4 (four) hours as needed (Pain).      albuterol (PROVENTIL HFA;VENTOLIN HFA) 108 (90 Base) MCG/ACT inhaler Inhale 1-2 puffs into the lungs daily as needed (for shortness of breath/wheezing). 1 Inhaler 0   aspirin 325 MG tablet Take 325 mg by mouth daily.     Blood Glucose Monitoring Suppl (ACCU-CHEK GUIDE) w/Device KIT See admin instructions.     budesonide (PULMICORT) 0.5 MG/2ML nebulizer  solution Take 0.5 mg by nebulization every 6 (six) hours as needed (Shortness of breath).      chlorpheniramine-HYDROcodone (TUSSIONEX) 10-8 MG/5ML SUER Take 5 mLs by mouth daily as needed for cough.      Cinnamon 500 MG TABS Take 1,000 mg by mouth daily.     clonazePAM (KLONOPIN) 1 MG tablet Take 1 mg by mouth 3 (three) times daily.      clopidogrel (PLAVIX) 75 MG tablet Take 1 tablet (75 mg total) by mouth daily.     DULoxetine (CYMBALTA) 60 MG capsule Take 1 capsule (60 mg total) by mouth 2 (two) times daily. 60 capsule 0   famotidine (PEPCID) 40 MG tablet Take 1 tablet (40 mg total) by mouth at bedtime. 90 tablet 3   fluticasone (FLONASE) 50 MCG/ACT nasal spray Place 2 sprays into  both nostrils daily as needed for allergies or rhinitis.      glimepiride (AMARYL) 4 MG tablet Take 1 tablet (4 mg total) by mouth daily with breakfast. For diabetes 30 tablet 0   lidocaine (XYLOCAINE) 5 % ointment Apply 1 application topically daily as needed for mild pain.      lisinopril (ZESTRIL) 20 MG tablet Take 20 mg by mouth daily.     metFORMIN (GLUCOPHAGE) 1000 MG tablet Take 1,000 mg by mouth 2 (two) times daily with a meal.     metoprolol succinate (TOPROL-XL) 50 MG 24 hr tablet Take 50 mg by mouth daily.     nitroGLYCERIN (NITROSTAT) 0.4 MG SL tablet Place 1 tablet (0.4 mg total) under the tongue every 5 (five) minutes as needed for chest pain. 5 tablet 1   oxyCODONE-Acetaminophen (PERCOCET PO) Take 15 mg by mouth 3 (three) times daily.     pantoprazole (PROTONIX) 40 MG tablet TAKE 1 TABLET BY MOUTH DAILY 90 tablet 3   promethazine (PHENERGAN) 25 MG tablet TAKE 1 TABLET BY MOUTH TWICE DAILY AS NEEDED FOR NAUSEA AND VOMITING 180 tablet 1   rosuvastatin (CRESTOR) 20 MG tablet Take 20 mg by mouth at bedtime.     tadalafil (CIALIS) 20 MG tablet Take 20 mg by mouth daily as needed for erectile dysfunction.     tamsulosin (FLOMAX) 0.4 MG CAPS capsule Take 1 capsule (0.4 mg total) by mouth daily. (Patient  taking differently: Take 0.4 mg by mouth 2 (two) times daily.) 30 capsule 0   zolpidem (AMBIEN) 10 MG tablet Take 10 mg by mouth at bedtime.     No current facility-administered medications for this visit.    Allergies as of 10/24/2022 - Review Complete 10/24/2022  Allergen Reaction Noted   Bupropion Other (See Comments) 09/30/2018   Trazodone Palpitations and Other (See Comments) 07/09/2018   Ketorolac Diarrhea 01/08/2016   Acetaminophen Hives 12/18/2016   Dilaudid [hydromorphone] Nausea And Vomiting 08/16/2016   Ibuprofen Itching and Other (See Comments) 06/05/2015   Meloxicam Other (See Comments) 07/27/2015   Naproxen Nausea Only 07/27/2015   Nsaids Hives 09/30/2018   Other Diarrhea 10/05/2019   Tramadol Hives and Itching 07/27/2015   Vicodin [hydrocodone-acetaminophen] Hives 12/03/2015   Cimetidine Other (See Comments) and Rash    Gabapentin Anxiety 01/26/2016   Tape Rash 04/16/2017    Family History  Problem Relation Age of Onset   Diabetes Mother    Deep vein thrombosis Mother    Pulmonary embolism Mother    Cancer Father        hodgins lymphoma   Diabetes Sister     Social History   Socioeconomic History   Marital status: Married    Spouse name: Not on file   Number of children: 2   Years of education: Not on file   Highest education level: Not on file  Occupational History   Occupation: FIRE FIGHTER    Comment: [employer]  Tobacco Use   Smoking status: Some Days    Current packs/day: 0.50    Average packs/day: 0.5 packs/day for 35.0 years (17.5 ttl pk-yrs)    Types: Cigarettes   Smokeless tobacco: Former    Types: Chew    Quit date: 08/22/2006  Vaping Use   Vaping status: Never Used  Substance and Sexual Activity   Alcohol use: Not Currently    Comment: Drinks occasional beer and wine   Drug use: No   Sexual activity: Yes    Birth control/protection: None  Other Topics Concern   Not on file  Social History Narrative   Not on file    Social Determinants of Health   Financial Resource Strain: Not on file  Food Insecurity: Not on file  Transportation Needs: Not on file  Physical Activity: Not on file  Stress: Not on file  Social Connections: Not on file    Review of systems General: negative for malaise, night sweats, fever, chills, weight loss Neck: Negative for lumps, goiter, pain and significant neck swelling Resp: Negative for cough, wheezing, dyspnea at rest CV: Negative for chest pain, leg swelling, palpitations, orthopnea GI: denies melena, hematochezia,diarrhea, constipation, dysphagia, odyonophagia, early satiety or unintentional weight loss. +GERD symptoms +nausea/vomiting  MSK: Negative for joint pain or swelling, back pain, and muscle pain. Derm: Negative for itching or rash Psych: Denies depression, anxiety, memory loss, confusion. No homicidal or suicidal ideation.  Heme: Negative for prolonged bleeding, bruising easily, and swollen nodes. Endocrine: Negative for cold or heat intolerance, polyuria, polydipsia and goiter. Neuro: negative for tremor, gait imbalance, syncope and seizures. The remainder of the review of systems is noncontributory.  Physical Exam: BP 117/78 (BP Location: Left Arm, Patient Position: Sitting, Cuff Size: Normal)   Pulse 80   Temp 97.9 F (36.6 C) (Oral)   Ht 5\' 11"  (1.803 m)   Wt 159 lb 14.4 oz (72.5 kg)   BMI 22.30 kg/m  General:   Alert and oriented. No distress noted. Pleasant and cooperative.  Head:  Normocephalic and atraumatic. Eyes:  Conjuctiva clear without scleral icterus. Mouth:  Oral mucosa pink and moist. Good dentition. No lesions. Heart: Normal rate and rhythm, s1 and s2 heart sounds present.  Lungs: Clear lung sounds in all lobes. Respirations equal and unlabored. Abdomen:  +BS, soft, non-tender and non-distended. No rebound or guarding. No HSM or masses noted. Derm: No palmar erythema or jaundice Msk:  Symmetrical without gross deformities. Normal  posture. Extremities:  Without edema. Neurologic:  Alert and  oriented x4 Psych:  Alert and cooperative. Normal mood and affect.  Invalid input(s): "6 MONTHS"   ASSESSMENT: BURGESS SHERIFF is a 62 y.o. male presenting today for follow up of GERD, nausea/vomiting.  GERD: on protonix 40mg , having to repeat dose about 3 times per week for breakthrough, was switched to omeprazole previously but switched back to protonix due to also being on plavix. Will continue with protonix 40mg , and increase to BID dosing. Instructed on good reflux precautions to include Be mindful of  greasy, spicy, fried, citrus foods, caffeine, carbonated drinks, chocolate and alcohol as these can increase reflux symptoms, Stay upright 2-3 hours after eating, prior to lying down and avoid eating late in the evenings.  Nausea/vomiting: previous workup with CT imaging, EGD and Colonoscopy unrevealing for etiology. He has been maintained on promethazine 25mg  PRN with good results, taking this usually in the evenings/at night which seems to control his symptoms well. Can continue to use promethazine as needed for severe nausea.   History of tubular adenomas, last TCS 2021, patient has colonoscopy scheduled for August 16th.    PLAN:  Continue with protonix 40mg , increase to BID, refill sent  2. Continue phenergan 25mg  PRN for severe nausea, refill sent  3. Good reflux precautions 4. Keep scheduled colonoscopy in August   All questions were answered, patient verbalized understanding and is in agreement with plan as outlined above.   Follow Up: 1 year  Addilyn Satterwhite L. Jeanmarie Hubert, MSN, APRN, AGNP-C Adult-Gerontology Nurse Practitioner [facility]  I have reviewed the note and agree with the APP's assessment as described in this progress note  Katrinka Blazing, MD Gastroenterology and Hepatology [facility]enterology

## 2022-10-28 ENCOUNTER — Telehealth (INDEPENDENT_AMBULATORY_CARE_PROVIDER_SITE_OTHER): Payer: Self-pay | Admitting: Gastroenterology

## 2022-10-28 NOTE — Telephone Encounter (Signed)
Pt left voicemail needing to reschedule TCS. Originally scheduled for 11/19/22 at 9:00am with Dr.Castaneda.  Returned call to patient; pt has received appt for a specialist he has been trying to get in with for a while and 8/20 is they only day they could see pt. Pt wanted to go to September. Advised pt I do not have September schedule out at this time but once I have it, I will give him a call.

## 2022-11-13 NOTE — Patient Instructions (Signed)
Derek Chen  11/13/2022     @PREFPERIOPPHARMACY @   Your procedure is scheduled on  11/19/2022.   Report to [name] at  0700  A.M.   Call this number if you have problems the morning of surgery:  [phone]  If you experience any cold or flu symptoms such as cough, fever, chills, shortness of breath, etc. between now and your scheduled surgery, please notify us at the above number.   Remember:  Follow the diet and prep instructions given to you by the office.    Use your nebulizer and your inhaler before you come and bring your rescue inhaler with you.      DO NOT take any medications for daibetes the morning of your procedure.     Take these medicines the morning of surgery with A SIP OF WATER       clonazepam, cymbalta, metoprolol, oxycodone(if needed), pantoprazole, flomax.     Do not wear jewelry, make-up or nail polish, including gel polish,  artificial nails, or any other type of covering on natural nails (fingers and  toes).  Do not wear lotions, powders, or perfumes, or deodorant.  Do not shave 48 hours prior to surgery.  Men may shave face and neck.  Do not bring valuables to the hospital.  [facility] is not responsible for any belongings or valuables.  Contacts, dentures or bridgework may not be worn into surgery.  Leave your suitcase in the car.  After surgery it may be brought to your room.  For patients admitted to the hospital, discharge time will be determined by your treatment team.  Patients discharged the day of surgery will not be allowed to drive home and must have someone with them for 24 hours.    Special instructions:   DO NOT smoke tobacco or vape for 24 hours before your procedure.  Please read over the following fact sheets that you were given. Anesthesia Post-op Instructions and Care and Recovery After Surgery      Colonoscopy, Adult, Care After The following information offers guidance on how to care for yourself after  your procedure. Your health care provider may also give you more specific instructions. If you have problems or questions, contact your health care provider. What can I expect after the procedure? After the procedure, it is common to have: A small amount of blood in your stool for 24 hours after the procedure. Some gas. Mild cramping or bloating of your abdomen. Follow these instructions at home: Eating and drinking  Drink enough fluid to keep your urine pale yellow. Follow instructions from your health care provider about eating or drinking restrictions. Resume your normal diet as told by your health care provider. Avoid heavy or fried foods that are hard to digest. Activity Rest as told by your health care provider. Avoid sitting for a long time without moving. Get up to take short walks every 1-2 hours. This is important to improve blood flow and breathing. Ask for help if you feel weak or unsteady. Return to your normal activities as told by your health care provider. Ask your health care provider what activities are safe for you. Managing cramping and bloating  Try walking around when you have cramps or feel bloated. If directed, apply heat to your abdomen as told by your health care provider. Use the heat source that your health care provider recommends, such as a moist heat pack or a heating pad. Place a towel  between your skin and the heat source. Leave the heat on for 20-30 minutes. Remove the heat if your skin turns bright red. This is especially important if you are unable to feel pain, heat, or cold. You have a greater risk of getting burned. General instructions If you were given a sedative during the procedure, it can affect you for several hours. Do not drive or operate machinery until your health care provider says that it is safe. For the first 24 hours after the procedure: Do not sign important documents. Do not drink alcohol. Do your regular daily activities at a slower  pace than normal. Eat soft foods that are easy to digest. Take over-the-counter and prescription medicines only as told by your health care provider. Keep all follow-up visits. This is important. Contact a health care provider if: You have blood in your stool 2-3 days after the procedure. Get help right away if: You have more than a small spotting of blood in your stool. You have large blood clots in your stool. You have swelling of your abdomen. You have nausea or vomiting. You have a fever. You have increasing pain in your abdomen that is not relieved with medicine. These symptoms may be an emergency. Get help right away. Call 911. Do not wait to see if the symptoms will go away. Do not drive yourself to the hospital. Summary After the procedure, it is common to have a small amount of blood in your stool. You may also have mild cramping and bloating of your abdomen. If you were given a sedative during the procedure, it can affect you for several hours. Do not drive or operate machinery until your health care provider says that it is safe. Get help right away if you have a lot of blood in your stool, nausea or vomiting, a fever, or increased pain in your abdomen. This information is not intended to replace advice given to you by your health care provider. Make sure you discuss any questions you have with your health care provider. Document Revised: 04/30/2022 Document Reviewed: 11/08/2020 Elsevier Patient Education  2024 Elsevier Inc. Monitored Anesthesia Care, Care After The following information offers guidance on how to care for yourself after your procedure. Your health care provider may also give you more specific instructions. If you have problems or questions, contact your health care provider. What can I expect after the procedure? After the procedure, it is common to have: Tiredness. Little or no memory about what happened during or after the procedure. Impaired judgment when  it comes to making decisions. Nausea or vomiting. Some trouble with balance. Follow these instructions at home: For the time period you were told by your health care provider:  Rest. Do not participate in activities where you could fall or become injured. Do not drive or use machinery. Do not drink alcohol. Do not take sleeping pills or medicines that cause drowsiness. Do not make important decisions or sign legal documents. Do not take care of children on your own. Medicines Take over-the-counter and prescription medicines only as told by your health care provider. If you were prescribed antibiotics, take them as told by your health care provider. Do not stop using the antibiotic even if you start to feel better. Eating and drinking Follow instructions from your health care provider about what you may eat and drink. Drink enough fluid to keep your urine pale yellow. If you vomit: Drink clear fluids slowly and in small amounts as you are able. Clear  fluids include water, ice chips, low-calorie sports drinks, and fruit juice that has water added to it (diluted fruit juice). Eat light and bland foods in small amounts as you are able. These foods include bananas, applesauce, rice, lean meats, toast, and crackers. General instructions  Have a responsible adult stay with you for the time you are told. It is important to have someone help care for you until you are awake and alert. If you have sleep apnea, surgery and some medicines can increase your risk for breathing problems. Follow instructions from your health care provider about wearing your sleep device: When you are sleeping. This includes during daytime naps. While taking prescription pain medicines, sleeping medicines, or medicines that make you drowsy. Do not use any products that contain nicotine or tobacco. These products include cigarettes, chewing tobacco, and vaping devices, such as e-cigarettes. If you need help quitting, ask  your health care provider. Contact a health care provider if: You feel nauseous or vomit every time you eat or drink. You feel light-headed. You are still sleepy or having trouble with balance after 24 hours. You get a rash. You have a fever. You have redness or swelling around the IV site. Get help right away if: You have trouble breathing. You have new confusion after you get home. These symptoms may be an emergency. Get help right away. Call 911. Do not wait to see if the symptoms will go away. Do not drive yourself to the hospital. This information is not intended to replace advice given to you by your health care provider. Make sure you discuss any questions you have with your health care provider. Document Revised: 08/13/2021 Document Reviewed: 08/13/2021 Elsevier Patient Education  2024 [employer].

## 2022-11-14 ENCOUNTER — Encounter (HOSPITAL_COMMUNITY)
Admission: RE | Admit: 2022-11-14 | Discharge: 2022-11-14 | Disposition: A | Discharge status: Home / Self Care | Payer: Medicare Other | Source: Ambulatory Visit | Attending: Gastroenterology | Admitting: Gastroenterology

## 2022-11-14 ENCOUNTER — Encounter (HOSPITAL_COMMUNITY): Payer: Self-pay

## 2022-11-14 DIAGNOSIS — E1159 Type 2 diabetes mellitus with other circulatory complications: Secondary | ICD-10-CM

## 2022-11-14 DIAGNOSIS — I10 Essential (primary) hypertension: Secondary | ICD-10-CM

## 2022-11-15 ENCOUNTER — Other Ambulatory Visit (HOSPITAL_COMMUNITY): Payer: Medicare Other

## 2022-11-19 ENCOUNTER — Encounter (HOSPITAL_COMMUNITY): Admission: RE | Payer: Self-pay | Source: Home / Self Care

## 2022-11-19 ENCOUNTER — Ambulatory Visit (HOSPITAL_COMMUNITY): Admission: RE | Admit: 2022-11-19 | Payer: Medicare Other | Source: Home / Self Care | Admitting: Gastroenterology

## 2022-11-19 SURGERY — COLONOSCOPY WITH PROPOFOL
Anesthesia: Monitor Anesthesia Care

## 2022-12-27 MED ORDER — CLENPIQ 10-3.5-12 MG-GM -GM/175ML PO SOLN: 1.0000 | ORAL | 0 refills | Status: DC

## 2022-12-27 NOTE — Telephone Encounter (Signed)
Spoke with pt. Scheduled for 10/8. Aware will send instructions and pre-op via mychart. Did not get rx for prep last time. Will send new rx. Aware will need 2 day prep.

## 2022-12-27 NOTE — Telephone Encounter (Signed)
Questionnaire from recall, no referral needed  

## 2022-12-27 NOTE — Addendum Note (Signed)
Addended by: Armstead Peaks on: 12/27/2022 09:07 AM   Modules accepted: Orders

## 2022-12-30 ENCOUNTER — Encounter: Payer: Self-pay | Admitting: *Deleted

## 2022-12-31 ENCOUNTER — Telehealth (INDEPENDENT_AMBULATORY_CARE_PROVIDER_SITE_OTHER): Payer: Self-pay | Admitting: Gastroenterology

## 2022-12-31 NOTE — Telephone Encounter (Signed)
Pt left voicemail that he is going to have to cancel TCS for 01/07/23 at 10:15am due to having COVID. Pt states he will call back next week to reschedule.

## 2023-01-02 ENCOUNTER — Inpatient Hospital Stay (HOSPITAL_COMMUNITY): Admission: RE | Admit: 2023-01-02 | Payer: Medicare Other | Source: Ambulatory Visit

## 2023-01-03 ENCOUNTER — Ambulatory Visit: Payer: Medicare Other | Admitting: Internal Medicine

## 2023-01-07 ENCOUNTER — Encounter (HOSPITAL_COMMUNITY): Admission: RE | Payer: Self-pay | Source: Home / Self Care

## 2023-01-07 ENCOUNTER — Ambulatory Visit (HOSPITAL_COMMUNITY): Admission: RE | Admit: 2023-01-07 | Payer: Medicare Other | Source: Home / Self Care | Admitting: Gastroenterology

## 2023-01-07 SURGERY — COLONOSCOPY WITH PROPOFOL
Anesthesia: Monitor Anesthesia Care

## 2023-02-10 ENCOUNTER — Telehealth (INDEPENDENT_AMBULATORY_CARE_PROVIDER_SITE_OTHER): Payer: Self-pay | Admitting: Gastroenterology

## 2023-02-10 NOTE — Telephone Encounter (Signed)
    02/10/23  Derek Chen 1960/10/23  What type of surgery is being performed? COLONOSCOPY  When is surgery scheduled? 02/25/23  CLEARANCE TO HOLD PLAVIX FOR 5 DAYS PRIOR  Name of physician performing surgery?  Dr. Katrinka Blazing [facility] at [facility] Phone: [phone] Fax: [phone]  Anethesia type (none, local, MAC, general)? MAC

## 2023-02-10 NOTE — Telephone Encounter (Signed)
Pt called in to reschedule TCS. Pt rescheduled to 02/25/23 at 8 am with Dr.Castaneda. instructions will be mailed. Clearance will be sent to PCP for hold of Plavix. Will call pt with pre op appt.   Notification or Prior Authorization is not required for the requested services You are not required to submit a notification/prior authorization based on the information provided. If you have general questions about the prior authorization requirements, visit [email or web address] > Clinician Resources > Advance and Admission Notification Requirements. The number above acknowledges your notification. Please write this reference number down for future reference. If you would like to request an organization determination, please call us at [phone]. Decision ID #: [id number]

## 2023-02-20 NOTE — Patient Instructions (Signed)
Derek Chen  02/20/2023     @PREFPERIOPPHARMACY @   Your procedure is scheduled on  02/25/2023.   Report to [name] at  [phone]  A.M.   Call this number if you have problems the morning of surgery:  [phone]  If you experience any cold or flu symptoms such as cough, fever, chills, shortness of breath, etc. between now and your scheduled surgery, please notify us at the above number.   Remember:         Your last dose of plavix should have been on 02/19/2023.       You should have also reduced your aspirin to 81 mg on 02/19/2023.      Use your nebulizer and your inhaler before you come and bring your rescue inhaler with you.     DO NOT take any medications for diabetes the morning of your procedure.   Follow the diet and prep instructions given to you by the office.    You may drink clear liquids until 0400 am on 02/25/2023.    Clear liquids allowed are:                    Water, Juice (No red color; non-citric and without pulp; diabetics please choose diet or no sugar options), Carbonated beverages (diabetics please choose diet or no sugar options), Clear Tea (No creamer, milk, or cream, including half & half and powdered creamer), Black Coffee Only (No creamer, milk or cream, including half & half and powdered creamer), and Clear Sports drink (No red color; diabetics please choose diet or no sugar options)    Take these medicines the morning of surgery with A SIP OF WATER            clonazepam, duloxetine, metoprolol, oxycodone(if needed), pantoprazole, flomax.     Do not wear jewelry, make-up or nail polish, including gel polish,  artificial nails, or any other type of covering on natural nails (fingers and  toes).  Do not wear lotions, powders, or perfumes, or deodorant.  Do not shave 48 hours prior to surgery.  Men may shave face and neck.  Do not bring valuables to the hospital.  [facility] is not responsible for any belongings or  valuables.  Contacts, dentures or bridgework may not be worn into surgery.  Leave your suitcase in the car.  After surgery it may be brought to your room.  For patients admitted to the hospital, discharge time will be determined by your treatment team.  Patients discharged the day of surgery will not be allowed to drive home and must have someone with them for 24 hours.    Special instructions:   DO NOT smoke tobacco or vape for 24 hours before your procedure.  Please read over the following fact sheets that you were given. Anesthesia Post-op Instructions and Care and Recovery After Surgery      Colonoscopy, Adult, Care After The following information offers guidance on how to care for yourself after your procedure. Your health care provider may also give you more specific instructions. If you have problems or questions, contact your health care provider. What can I expect after the procedure? After the procedure, it is common to have: A small amount of blood in your stool for 24 hours after the procedure. Some gas. Mild cramping or bloating of your abdomen. Follow these instructions at home: Eating and drinking  Drink enough fluid to keep your urine pale yellow. Follow  instructions from your health care provider about eating or drinking restrictions. Resume your normal diet as told by your health care provider. Avoid heavy or fried foods that are hard to digest. Activity Rest as told by your health care provider. Avoid sitting for a long time without moving. Get up to take short walks every 1-2 hours. This is important to improve blood flow and breathing. Ask for help if you feel weak or unsteady. Return to your normal activities as told by your health care provider. Ask your health care provider what activities are safe for you. Managing cramping and bloating  Try walking around when you have cramps or feel bloated. If directed, apply heat to your abdomen as told by your health  care provider. Use the heat source that your health care provider recommends, such as a moist heat pack or a heating pad. Place a towel between your skin and the heat source. Leave the heat on for 20-30 minutes. Remove the heat if your skin turns bright red. This is especially important if you are unable to feel pain, heat, or cold. You have a greater risk of getting burned. General instructions If you were given a sedative during the procedure, it can affect you for several hours. Do not drive or operate machinery until your health care provider says that it is safe. For the first 24 hours after the procedure: Do not sign important documents. Do not drink alcohol. Do your regular daily activities at a slower pace than normal. Eat soft foods that are easy to digest. Take over-the-counter and prescription medicines only as told by your health care provider. Keep all follow-up visits. This is important. Contact a health care provider if: You have blood in your stool 2-3 days after the procedure. Get help right away if: You have more than a small spotting of blood in your stool. You have large blood clots in your stool. You have swelling of your abdomen. You have nausea or vomiting. You have a fever. You have increasing pain in your abdomen that is not relieved with medicine. These symptoms may be an emergency. Get help right away. Call 911. Do not wait to see if the symptoms will go away. Do not drive yourself to the hospital. Summary After the procedure, it is common to have a small amount of blood in your stool. You may also have mild cramping and bloating of your abdomen. If you were given a sedative during the procedure, it can affect you for several hours. Do not drive or operate machinery until your health care provider says that it is safe. Get help right away if you have a lot of blood in your stool, nausea or vomiting, a fever, or increased pain in your abdomen. This information  is not intended to replace advice given to you by your health care provider. Make sure you discuss any questions you have with your health care provider. Document Revised: 04/30/2022 Document Reviewed: 11/08/2020 Elsevier Patient Education  2024 [employer] Monitored Anesthesia Care, Care After The following information offers guidance on how to care for yourself after your procedure. Your health care provider may also give you more specific instructions. If you have problems or questions, contact your health care provider. What can I expect after the procedure? After the procedure, it is common to have: Tiredness. Little or no memory about what happened during or after the procedure. Impaired judgment when it comes to making decisions. Nausea or vomiting. Some trouble with balance. Follow  these instructions at home: For the time period you were told by your health care provider:  Rest. Do not participate in activities where you could fall or become injured. Do not drive or use machinery. Do not drink alcohol. Do not take sleeping pills or medicines that cause drowsiness. Do not make important decisions or sign legal documents. Do not take care of children on your own. Medicines Take over-the-counter and prescription medicines only as told by your health care provider. If you were prescribed antibiotics, take them as told by your health care provider. Do not stop using the antibiotic even if you start to feel better. Eating and drinking Follow instructions from your health care provider about what you may eat and drink. Drink enough fluid to keep your urine pale yellow. If you vomit: Drink clear fluids slowly and in small amounts as you are able. Clear fluids include water, ice chips, low-calorie sports drinks, and fruit juice that has water added to it (diluted fruit juice). Eat light and bland foods in small amounts as you are able. These foods include bananas, applesauce, rice,  lean meats, toast, and crackers. General instructions  Have a responsible adult stay with you for the time you are told. It is important to have someone help care for you until you are awake and alert. If you have sleep apnea, surgery and some medicines can increase your risk for breathing problems. Follow instructions from your health care provider about wearing your sleep device: When you are sleeping. This includes during daytime naps. While taking prescription pain medicines, sleeping medicines, or medicines that make you drowsy. Do not use any products that contain nicotine or tobacco. These products include cigarettes, chewing tobacco, and vaping devices, such as e-cigarettes. If you need help quitting, ask your health care provider. Contact a health care provider if: You feel nauseous or vomit every time you eat or drink. You feel light-headed. You are still sleepy or having trouble with balance after 24 hours. You get a rash. You have a fever. You have redness or swelling around the IV site. Get help right away if: You have trouble breathing. You have new confusion after you get home. These symptoms may be an emergency. Get help right away. Call 911. Do not wait to see if the symptoms will go away. Do not drive yourself to the hospital. This information is not intended to replace advice given to you by your health care provider. Make sure you discuss any questions you have with your health care provider. Document Revised: 08/13/2021 Document Reviewed: 08/13/2021 Elsevier Patient Education  2024 [employer].

## 2023-02-21 ENCOUNTER — Encounter (HOSPITAL_COMMUNITY): Payer: Self-pay

## 2023-02-21 ENCOUNTER — Other Ambulatory Visit: Payer: Self-pay

## 2023-02-21 ENCOUNTER — Encounter (HOSPITAL_COMMUNITY)
Admission: RE | Admit: 2023-02-21 | Discharge: 2023-02-21 | Disposition: A | Discharge status: Home / Self Care | Payer: Medicare Other | Source: Ambulatory Visit | Attending: Gastroenterology | Admitting: Gastroenterology

## 2023-02-21 VITALS — BP 135/78 | HR 73 | Temp 97.8°F | Resp 18 | Ht 71.0 in | Wt 159.8 lb

## 2023-02-21 DIAGNOSIS — Z01818 Encounter for other preprocedural examination: Secondary | ICD-10-CM | POA: Insufficient documentation

## 2023-02-21 DIAGNOSIS — R0789 Other chest pain: Secondary | ICD-10-CM

## 2023-02-21 DIAGNOSIS — E119 Type 2 diabetes mellitus without complications: Secondary | ICD-10-CM | POA: Diagnosis not present

## 2023-02-21 DIAGNOSIS — I519 Heart disease, unspecified: Secondary | ICD-10-CM | POA: Insufficient documentation

## 2023-02-21 DIAGNOSIS — I452 Bifascicular block: Secondary | ICD-10-CM | POA: Insufficient documentation

## 2023-02-21 LAB — BASIC METABOLIC PANEL
Anion gap: 9 (ref 5–15)
BUN: 17 mg/dL (ref 8–23)
CO2: 22 mmol/L (ref 22–32)
Calcium: 8.9 mg/dL (ref 8.9–10.3)
Chloride: 100 mmol/L (ref 98–111)
Creatinine, Ser: 0.8 mg/dL (ref 0.61–1.24)
GFR, Estimated: >60 mL/min (ref >60)
Glucose, Bld: 346 mg/dL — ABNORMAL HIGH (ref 70–99)
Potassium: 4.3 mmol/L (ref 3.5–5.1)
Sodium: 131 mmol/L — ABNORMAL LOW (ref 135–145)

## 2023-02-24 ENCOUNTER — Telehealth (INDEPENDENT_AMBULATORY_CARE_PROVIDER_SITE_OTHER): Payer: Self-pay | Admitting: Gastroenterology

## 2023-02-24 NOTE — Telephone Encounter (Signed)
Thanks, appreciate it, please let endo know about this Thanks

## 2023-02-24 NOTE — Telephone Encounter (Signed)
Pt called in and is needing to cancel TCS for 02/25/23 at 8 am due to unable to get sugars under 200. Pt states sugars have been high all weekend. Pt is going to see his doctor about getting sugars down. Advised pt to give me a call once sugars are better controlled to reschedule. Pt verbalized understanding.

## 2023-02-25 ENCOUNTER — Ambulatory Visit (HOSPITAL_COMMUNITY): Admission: RE | Admit: 2023-02-25 | Payer: Medicare Other | Source: Home / Self Care | Admitting: Gastroenterology

## 2023-02-25 ENCOUNTER — Encounter (HOSPITAL_COMMUNITY): Admission: RE | Payer: Self-pay | Source: Home / Self Care

## 2023-02-25 SURGERY — COLONOSCOPY WITH PROPOFOL
Anesthesia: Monitor Anesthesia Care

## 2023-03-04 ENCOUNTER — Ambulatory Visit: Payer: Medicare Other | Admitting: Internal Medicine

## 2023-04-14 ENCOUNTER — Ambulatory Visit: Payer: Medicare Other | Admitting: Internal Medicine

## 2023-08-27 ENCOUNTER — Other Ambulatory Visit (HOSPITAL_COMMUNITY): Payer: Self-pay | Admitting: Nurse Practitioner

## 2023-08-27 DIAGNOSIS — Z122 Encounter for screening for malignant neoplasm of respiratory organs: Secondary | ICD-10-CM

## 2023-09-12 ENCOUNTER — Ambulatory Visit (HOSPITAL_COMMUNITY)
Admission: RE | Admit: 2023-09-12 | Discharge: 2023-09-12 | Disposition: A | Discharge status: Home / Self Care | Source: Ambulatory Visit | Attending: Nurse Practitioner | Admitting: Nurse Practitioner

## 2023-09-12 DIAGNOSIS — F17210 Nicotine dependence, cigarettes, uncomplicated: Secondary | ICD-10-CM | POA: Diagnosis not present

## 2023-09-12 DIAGNOSIS — J8410 Pulmonary fibrosis, unspecified: Secondary | ICD-10-CM | POA: Insufficient documentation

## 2023-09-12 DIAGNOSIS — I700 Atherosclerosis of aorta: Secondary | ICD-10-CM | POA: Insufficient documentation

## 2023-09-12 DIAGNOSIS — I2510 Atherosclerotic heart disease of native coronary artery without angina pectoris: Secondary | ICD-10-CM | POA: Diagnosis not present

## 2023-09-12 DIAGNOSIS — J439 Emphysema, unspecified: Secondary | ICD-10-CM | POA: Diagnosis not present

## 2023-09-12 DIAGNOSIS — Z122 Encounter for screening for malignant neoplasm of respiratory organs: Secondary | ICD-10-CM | POA: Insufficient documentation

## 2023-10-20 ENCOUNTER — Ambulatory Visit (INDEPENDENT_AMBULATORY_CARE_PROVIDER_SITE_OTHER): Admitting: Gastroenterology

## 2023-10-20 ENCOUNTER — Encounter (INDEPENDENT_AMBULATORY_CARE_PROVIDER_SITE_OTHER): Payer: Self-pay | Admitting: Gastroenterology

## 2023-10-20 VITALS — BP 133/86 | HR 98 | Temp 87.0°F | Ht 71.0 in | Wt 159.8 lb

## 2023-10-20 DIAGNOSIS — Z860100 Personal history of colon polyps, unspecified: Secondary | ICD-10-CM | POA: Diagnosis not present

## 2023-10-20 DIAGNOSIS — R112 Nausea with vomiting, unspecified: Secondary | ICD-10-CM

## 2023-10-20 DIAGNOSIS — K219 Gastro-esophageal reflux disease without esophagitis: Secondary | ICD-10-CM | POA: Diagnosis not present

## 2023-10-20 DIAGNOSIS — R110 Nausea: Secondary | ICD-10-CM

## 2023-10-20 MED ORDER — PROMETHAZINE HCL 25 MG PO TABS
25.0000 mg | ORAL_TABLET | Freq: Two times a day (BID) | ORAL | 3 refills | Status: AC | PRN
Start: 2023-10-20 — End: ?

## 2023-10-20 MED ORDER — PANTOPRAZOLE SODIUM 40 MG PO TBEC
40.0000 mg | DELAYED_RELEASE_TABLET | Freq: Two times a day (BID) | ORAL | 3 refills | Status: AC
Start: 2023-10-20 — End: ?

## 2023-10-20 MED ORDER — FAMOTIDINE 40 MG PO TABS
40.0000 mg | ORAL_TABLET | Freq: Every evening | ORAL | 3 refills | Status: AC
Start: 2023-10-20 — End: ?

## 2023-10-20 NOTE — Progress Notes (Signed)
 Toribio Fortune, M.D. Gastroenterology & Hepatology [facility] Gastroenterology [address] [place], [place] [address]  Primary Care Physician: Jerel Gee, NP [address] [place] [address]  I will communicate my assessment and recommendations to the referring MD via EMR.  Problems: GERD  History of Present Illness: URA HAUSEN is a 63 y.o. male with past medical history of coronary artery disease, COPD chronic kidney disease diabetes mellitus, GERD, anxiety, BPH, cardiomyopathy, coronary artery disease status post stent placement, who presents for follow up of GERD.  The patient was last seen on 10/24/2022. At that time, the patient was continued on Protonix  40 mg twice daily and Phenergan  as needed for nausea.  He was advised to proceed with colonoscopy for surveillance of tubular adenomas but he canceled these procedures 3 times due to poor glucose control.  Patient reports that he has presented better control of his heartburn recently, as he does not have any heartburn. No odynophagia or dysphagia. Takes pantoprazole  BID 30 minutes before meals. May take famotidine  at night PRN 2-3 times a week.  He also has some nausea, which improves with promethazine .  States he wants to schedule his colonoscopy as his glucose has been better controlled recently.  The patient denies having any other complaints such as fever, chills, hematochezia, melena, hematemesis, abdominal distention, abdominal pain, diarrhea, jaundice, pruritus or weight loss.  Last Colonoscopy: 2021 Six 4 to 10 mm polyps in the rectum, at the splenic flexure and in the transverse colon,removed with a hot snare.  Pathology consistent with tubular adenomas. Internal hemorrhoids.  Last EGD: 2021, patchy erythema in the posterior wall of the gastric body, duodenitis.  Gastric biopsies were positive for reactive changes and chronic inflammation but negative for H. pylori or  dysplasia.  Biopsies of the duodenum were normal.   Past Medical History: Past Medical History:  Diagnosis Date   Anxiety    Arthritis    Basal cell cancer    Chest and back - status post skin grafting   BPH (benign prostatic hyperplasia)    Cardiomyopathy (HCC)    LVEF 45-50%   CHF (congestive heart failure) (HCC)    Complication of anesthesia    Difficult to wake up after anesthesia   COPD (chronic obstructive pulmonary disease) (HCC)    Coronary artery disease    NSTEMI - DES mid LAD December 2011 (patent December 2013)   Dyslipidemia    Essential hypertension, benign    GERD (gastroesophageal reflux disease)    Hepatitis B antibody positive 2006   History of kidney stones    MI (myocardial infarction) (HCC)    3 MI's last in 2016.   PONV (postoperative nausea and vomiting)    pt states that zofran  and scope patch did not help PONV after last surgery.   Renal insufficiency    Sleep apnea    Type 2 diabetes mellitus (HCC) 2012   Viral pneumonia, unspecified 2004    Past Surgical History: Past Surgical History:  Procedure Laterality Date   APPENDECTOMY     BIOPSY  10/01/2019   Procedure: BIOPSY;  Surgeon: Golda Claudis PENNER, MD;  [facility]: [facility];  Service: Endoscopy;;  duodenum gastric   COLONOSCOPY N/A 08/10/2015   Procedure: COLONOSCOPY;  Surgeon: Claudis PENNER Golda, MD;  Location: [facility];  Service: Endoscopy;  Laterality: N/A;  730   COLONOSCOPY WITH PROPOFOL  N/A 10/01/2019   Procedure: COLONOSCOPY WITH PROPOFOL ;  Surgeon: Golda Claudis PENNER, MD;  Location: [facility];  Service: Endoscopy;  Laterality: N/A;  1105   CORONARY STENT PLACEMENT     DRUG INDUCED ENDOSCOPY N/A 11/30/2018   Procedure: DRUG INDUCED SLEEP ENDOSCOPY;  Surgeon: Carlie Clark, MD;  Location: [facility];  Service: ENT;  Laterality: N/A;   ESOPHAGOGASTRODUODENOSCOPY (EGD) WITH PROPOFOL  N/A 10/01/2019   Procedure: ESOPHAGOGASTRODUODENOSCOPY (EGD) WITH PROPOFOL ;  Surgeon:  Golda Claudis PENNER, MD;  [facility]: [facility];  Service: Endoscopy;  Laterality: N/A;   FINGER SURGERY Right    index, gnagrene in finger.   HAND SURGERY Right    from dog bite   KNEE ARTHROSCOPY     Left   LEFT HEART CATHETERIZATION WITH CORONARY ANGIOGRAM N/A 03/20/2012   Procedure: LEFT HEART CATHETERIZATION WITH CORONARY ANGIOGRAM;  Surgeon: Aleene JINNY Passe, MD;  Location: [facility] CATH LAB;  Service: Cardiovascular;  Laterality: N/A;   MASS EXCISION N/A 08/23/2016   Procedure: EXCISION BASAL CELL CARCINOMA ON BACK OVER 4 CM;  Surgeon: Mavis Anes, MD;  Location: [facility];  Service: General;  Laterality: N/A;   POLYPECTOMY  10/01/2019   Procedure: POLYPECTOMY;  Surgeon: Golda Claudis PENNER, MD;  [facility]: [facility];  Service: Endoscopy;;  colon   PORT-A-CATH REMOVAL     PORTACATH PLACEMENT     SKIN GRAFT SPLIT THICKNESS TRUNK  01/23/2010   Right chest;  Elsie Holland, M.D.   VASECTOMY      Family History: Family History  Problem Relation Age of Onset   Diabetes Mother    Deep vein thrombosis Mother    Pulmonary embolism Mother    Cancer Father        hodgins lymphoma   Diabetes Sister     Social History: Social History   Tobacco Use  Smoking Status Some Days   Current packs/day: 0.50   Average packs/day: 0.5 packs/day for 35.0 years (17.5 ttl pk-yrs)   Types: Cigarettes  Smokeless Tobacco Former   Types: Chew   Quit date: 08/22/2006   Social History   Substance and Sexual Activity  Alcohol Use Not Currently   Comment: Drinks occasional beer and wine   Social History   Substance and Sexual Activity  Drug Use No    Allergies: Allergies  Allergen Reactions   Bupropion Other (See Comments)    paralyzed   Trazodone  Palpitations and Other (See Comments)    paralyzed   Ketorolac  Diarrhea   Acetaminophen  Hives    Too much give him hives   Dilaudid  [Hydromorphone ] Nausea And Vomiting    Tolerates IV formulation--unable to tolerate pill form   Flomax   [Tamsulosin ]     Dehydrates patient.   Ibuprofen Itching and Other (See Comments)   Meloxicam Other (See Comments)    Severe headaches.   Naproxen  Nausea Only   Nsaids Hives    Take to much causes hives   Other Diarrhea   Tramadol Hives and Itching   Vicodin [Hydrocodone -Acetaminophen ] Hives   Cimetidine Other (See Comments) and Rash    headaches Severe headache   Gabapentin  Anxiety    Neck twitches   Tape Rash    Paper tape    Medications: Current Outpatient Medications  Medication Sig Dispense Refill   Acetaminophen  500 MG capsule Take 500 mg by mouth every 4 (four) hours as needed (Pain).      albuterol  (PROVENTIL  HFA;VENTOLIN  HFA) 108 (90 Base) MCG/ACT inhaler Inhale 1-2 puffs into the lungs daily as needed (for shortness of breath/wheezing). 1 Inhaler 0   aspirin  325 MG tablet  Take 325 mg by mouth daily.     Blood Glucose Monitoring Suppl (ACCU-CHEK GUIDE) w/Device KIT See admin instructions.     budesonide  (PULMICORT ) 0.5 MG/2ML nebulizer solution Take 0.5 mg by nebulization every 6 (six) hours as needed (Shortness of breath).      Cinnamon 500 MG TABS Take 1,000 mg by mouth daily.     clopidogrel  (PLAVIX ) 75 MG tablet Take 1 tablet (75 mg total) by mouth daily.     DULoxetine  (CYMBALTA ) 60 MG capsule Take 1 capsule (60 mg total) by mouth 2 (two) times daily. 60 capsule 0   famotidine  (PEPCID ) 40 MG tablet Take 1 tablet (40 mg total) by mouth at bedtime. 90 tablet 3   fluticasone  (FLONASE ) 50 MCG/ACT nasal spray Place 2 sprays into both nostrils daily as needed for allergies or rhinitis.      LANTUS SOLOSTAR 100 UNIT/ML Solostar Pen Inject 55 Units into the skin at bedtime.     lidocaine  (XYLOCAINE ) 5 % ointment Apply 1 application topically daily as needed for mild pain.      lisinopril  (ZESTRIL ) 20 MG tablet Take 20 mg by mouth daily.     LORazepam  (ATIVAN ) 1 MG tablet Take 1 mg by mouth every 8 (eight) hours as needed for anxiety.     metFORMIN  (GLUCOPHAGE ) 1000 MG  tablet Take 1,000 mg by mouth 2 (two) times daily with a meal.     metoprolol  succinate (TOPROL -XL) 50 MG 24 hr tablet Take 50 mg by mouth daily.     nitroGLYCERIN  (NITROSTAT ) 0.4 MG SL tablet Place 1 tablet (0.4 mg total) under the tongue every 5 (five) minutes as needed for chest pain. 5 tablet 1   oxyCODONE -Acetaminophen  (PERCOCET PO) Take 15 mg by mouth 3 (three) times daily.     pantoprazole  (PROTONIX ) 40 MG tablet Take 1 tablet (40 mg total) by mouth 2 (two) times daily. 180 tablet 3   promethazine  (PHENERGAN ) 25 MG tablet Take 1 tablet (25 mg total) by mouth every 12 (twelve) hours as needed for nausea or vomiting. TAKE 1 TABLET BY MOUTH TWICE DAILY AS NEEDED FOR NAUSEA AND VOMITING 180 tablet 3   rosuvastatin  (CRESTOR ) 20 MG tablet Take 20 mg by mouth at bedtime.     Semaglutide (OZEMPIC, 0.25 OR 0.5 MG/DOSE, [place]) Inject 0.5 mg into the skin once a week.     tadalafil (CIALIS) 20 MG tablet Take 20 mg by mouth daily as needed for erectile dysfunction.     tamsulosin  (FLOMAX ) 0.4 MG CAPS capsule Take 1 capsule (0.4 mg total) by mouth daily. (Patient taking differently: Take 0.4 mg by mouth 2 (two) times daily.) 30 capsule 0   zolpidem  (AMBIEN ) 10 MG tablet Take 10 mg by mouth at bedtime.     No current facility-administered medications for this visit.    Review of Systems: GENERAL: negative for malaise, night sweats HEENT: No changes in hearing or vision, no nose bleeds or other nasal problems. NECK: Negative for lumps, goiter, pain and significant neck swelling RESPIRATORY: Negative for cough, wheezing CARDIOVASCULAR: Negative for chest pain, leg swelling, palpitations, orthopnea GI: SEE HPI MUSCULOSKELETAL: Negative for joint pain or swelling, back pain, and muscle pain. SKIN: Negative for lesions, rash PSYCH: Negative for sleep disturbance, mood disorder and recent psychosocial stressors. HEMATOLOGY Negative for prolonged bleeding, bruising easily, and swollen nodes. ENDOCRINE:  Negative for cold or heat intolerance, polyuria, polydipsia and goiter. NEURO: negative for tremor, gait imbalance, syncope and seizures. The remainder of the review of  systems is noncontributory.   Physical Exam: BP 133/86 (BP Location: Left Arm, Patient Position: Sitting, Cuff Size: Normal)   Pulse 98   Temp (!) 87 F (30.6 C) (Temporal)   Ht 5' 11 (1.803 m)   Wt 159 lb 12.8 oz (72.5 kg)   BMI 22.29 kg/m  GENERAL: The patient is AO x3, in no acute distress. HEENT: Head is normocephalic and atraumatic. EOMI are intact. Mouth is well hydrated and without lesions. NECK: Supple. No masses LUNGS: Clear to auscultation. No presence of rhonchi/wheezing/rales. Adequate chest expansion HEART: RRR, normal s1 and s2. ABDOMEN: Soft, nontender, no guarding, no peritoneal signs, and nondistended. BS +. No masses. EXTREMITIES: Without any cyanosis, clubbing, rash, lesions or edema. NEUROLOGIC: AOx3, no focal motor deficit. SKIN: no jaundice, no rashes  Imaging/Labs: as above  I personally reviewed and interpreted the available labs, imaging and endoscopic files.  Impression and Plan: PRATT BRESS is a 63 y.o. male with past medical history of coronary artery disease, COPD chronic kidney disease diabetes mellitus, GERD, anxiety, BPH, cardiomyopathy, coronary artery disease status post stent placement, who presents for follow up of GERD.  Patient has had relatively well-controlled GERD while on PPI twice daily, but has had to take famotidine  as needed a few times per week.  He has not presented any red flag signs and his most recent endoscopic evaluation was unremarkable.  Will continue with the same doses for now but if he were to have persistent symptoms, we will need to possibly change him to Voquezna monotherapy.  Has had chronic issues with nausea have been controlled with the use of Phenergan .  Will refill this medication for now.  Patient is here for colonoscopy for 3 of colonic  polyps.  As he is blood glucose has been better controlled, we will schedule him for repeat colonoscopy.  -Schedule   colonoscopy -Continue pantoprazole  40 mg twice a day -Can take famotidine  as needed for breakthrough episodes of heartburn -If persistent symptoms, can consider switching to Voquezna on follow-up -Continue Phenergan  as needed for nausea  All questions were answered.      Toribio Fortune, MD Gastroenterology and Hepatology [facility] Gastroenterology

## 2023-10-20 NOTE — Patient Instructions (Addendum)
 Schedule   colonoscopy Continue pantoprazole  40 mg twice a day Can take famotidine  as needed for breakthrough episodes of heartburn If persistent symptoms, can consider switching to Voquezna on follow-up Continue Phenergan  as needed for nausea

## 2023-10-22 ENCOUNTER — Telehealth: Payer: Self-pay | Admitting: *Deleted

## 2023-10-22 NOTE — Telephone Encounter (Signed)
    10/22/23  Ozell KANDICE Pereyra Sep 29, 1960  What type of surgery is being performed? COLONOSCOPY  When is surgery scheduled? TBD  What type of clearance is required (medical or pharmacy to hold medication or both? MEDICATION  Are there any medications that need to be held prior to surgery and how long? PLAVIX  X 5 DAYS  Name of physician performing surgery?  Dr. Eartha Rouse Gastroenterology at [facility] Phone: [phone] Fax: [phone]  Anethesia type (none, local, MAC, general)? MAC

## 2023-10-22 NOTE — Telephone Encounter (Signed)
 Sent to PCP ?

## 2023-10-23 ENCOUNTER — Ambulatory Visit (INDEPENDENT_AMBULATORY_CARE_PROVIDER_SITE_OTHER): Payer: Medicare Other | Admitting: Gastroenterology

## 2023-11-11 ENCOUNTER — Encounter: Payer: Self-pay | Admitting: *Deleted

## 2023-11-11 MED ORDER — CLENPIQ 10-3.5-12 MG-GM -GM/175ML PO SOLN: 1.0000 | ORAL | 0 refills | Status: AC

## 2023-11-11 NOTE — Telephone Encounter (Signed)
 Received call back. They were unable to reach the nurse but will send message back to check on status

## 2023-11-11 NOTE — Telephone Encounter (Addendum)
 Clearance received and scanned into media. Okay to hold plavix .   Spoke with pt. He has been scheduled for 9/16. Aware will mail instructions to him (confirmed address), rx sent to pharmacy (low volume prep pt requested). Will call back with pre-op appt. Aware to hold plavix  x 5 days prior and to decrease Aspirin  to 81mg  daily.

## 2023-11-11 NOTE — Telephone Encounter (Signed)
 [facility] as no clearance has been received and had to LM for call back

## 2023-11-11 NOTE — Addendum Note (Signed)
 Addended by: JEANELL GRAEME RAMAN on: 11/11/2023 11:45 AM   Modules accepted: Orders

## 2023-11-17 NOTE — Telephone Encounter (Signed)
 Thanks

## 2023-12-02 ENCOUNTER — Telehealth (INDEPENDENT_AMBULATORY_CARE_PROVIDER_SITE_OTHER): Payer: Self-pay

## 2023-12-02 NOTE — Telephone Encounter (Signed)
 Derek Chen, Derek Chen, [place] I will just leave the PAT

## 2023-12-02 NOTE — Telephone Encounter (Signed)
 Spoke with pt, he has been rescheduled from 09/16 to 09/19 will send new instructions and updated pre-op to pt in the mail.

## 2023-12-11 NOTE — Patient Instructions (Addendum)
 Your procedure is scheduled on: 12/19/2023  Report to [facility] Main Entrance at  6:00   AM.  Call this number if you have problems the morning of surgery: [phone]   Remember:              Follow Directions on the letter you received from Your Physician's office regarding the Bowel Prep Hold Ozempic for 7 days prior to procedure as instructed in letter Hold Plavix  5 days prior to procedureas instructed in letter  Take only 1/2 dose of Lantus insulin  the night before procedure and no diabetic medications am of procedure              No Smoking the day of Procedure :   Take these medicines the morning of surgery with A SIP OF WATER : Flomax , Pantoprazole , Metoprolol , Ativan , Pepcid , and Cymbalta   Use inhalers if Needed   Do not wear jewelry, make-up or nail polish.    Do not bring valuables to the hospital.  Contacts, dentures or bridgework may not be worn into surgery.  .   Patients discharged the day of surgery will not be allowed to drive home.     Colonoscopy, Adult, Care After This sheet gives you information about how to care for yourself after your procedure. Your health care provider may also give you more specific instructions. If you have problems or questions, contact your health care provider. What can I expect after the procedure? After the procedure, it is common to have: A small amount of blood in your stool for 24 hours after the procedure. Some gas. Mild abdominal cramping or bloating.  Follow these instructions at home: General instructions  For the first 24 hours after the procedure: Do not drive or use machinery. Do not sign important documents. Do not drink alcohol. Do your regular daily activities at a slower pace than normal. Eat soft, easy-to-digest foods. Rest often. Take over-the-counter or prescription medicines only as told by your health care provider. It is up to you to get the results of your procedure. Ask your health care provider,  or the department performing the procedure, when your results will be ready. Relieving cramping and bloating Try walking around when you have cramps or feel bloated. Apply heat to your abdomen as told by your health care provider. Use a heat source that your health care provider recommends, such as a moist heat pack or a heating pad. Place a towel between your skin and the heat source. Leave the heat on for 20-30 minutes. Remove the heat if your skin turns bright red. This is especially important if you are unable to feel pain, heat, or cold. You may have a greater risk of getting burned. Eating and drinking Drink enough fluid to keep your urine clear or pale yellow. Resume your normal diet as instructed by your health care provider. Avoid heavy or fried foods that are hard to digest. Avoid drinking alcohol for as long as instructed by your health care provider. Contact a health care provider if: You have blood in your stool 2-3 days after the procedure. Get help right away if: You have more than a small spotting of blood in your stool. You pass large blood clots in your stool. Your abdomen is swollen. You have nausea or vomiting. You have a fever. You have increasing abdominal pain that is not relieved with medicine. This information is not intended to replace advice given to you by your health care provider. Make sure you discuss  any questions you have with your health care provider. Document Released: 10/31/2003 Document Revised: 12/11/2015 Document Reviewed: 05/30/2015 Elsevier Interactive Patient Education  [employer].

## 2023-12-12 ENCOUNTER — Encounter (HOSPITAL_COMMUNITY)
Admission: RE | Admit: 2023-12-12 | Discharge: 2023-12-12 | Disposition: A | Discharge status: Home / Self Care | Source: Ambulatory Visit | Attending: Gastroenterology | Admitting: Gastroenterology

## 2023-12-12 ENCOUNTER — Encounter (HOSPITAL_COMMUNITY): Payer: Self-pay

## 2023-12-12 DIAGNOSIS — I2510 Atherosclerotic heart disease of native coronary artery without angina pectoris: Secondary | ICD-10-CM

## 2023-12-12 DIAGNOSIS — I214 Non-ST elevation (NSTEMI) myocardial infarction: Secondary | ICD-10-CM

## 2023-12-12 DIAGNOSIS — I10 Essential (primary) hypertension: Secondary | ICD-10-CM

## 2023-12-12 DIAGNOSIS — E119 Type 2 diabetes mellitus without complications: Secondary | ICD-10-CM

## 2023-12-15 ENCOUNTER — Telehealth (INDEPENDENT_AMBULATORY_CARE_PROVIDER_SITE_OTHER): Payer: Self-pay

## 2023-12-15 NOTE — Telephone Encounter (Signed)
 Pt left vm stating he wanted to reschedule his procedure due to no transportation. Pt states he will call us  back when he can find someone to bring him to his procedure.

## 2023-12-19 ENCOUNTER — Encounter (HOSPITAL_COMMUNITY): Admission: RE | Payer: Self-pay | Source: Home / Self Care

## 2023-12-19 ENCOUNTER — Ambulatory Visit (HOSPITAL_COMMUNITY): Admission: RE | Admit: 2023-12-19 | Source: Home / Self Care | Admitting: Gastroenterology

## 2023-12-19 SURGERY — COLONOSCOPY
Anesthesia: Choice

## 2024-01-14 ENCOUNTER — Encounter (INDEPENDENT_AMBULATORY_CARE_PROVIDER_SITE_OTHER): Payer: Self-pay | Admitting: Gastroenterology
